# Patient Record
Sex: Female | Born: 1955 | Race: White | Hispanic: No | Smoking: Former smoker
Health system: Southern US, Community
[De-identification: ages and names within clinical notes are randomized; demographics above are authoritative.]

## PROBLEM LIST (undated history)

## (undated) DIAGNOSIS — I2510 Atherosclerotic heart disease of native coronary artery without angina pectoris: Secondary | ICD-10-CM

## (undated) DIAGNOSIS — G8929 Other chronic pain: Secondary | ICD-10-CM

## (undated) DIAGNOSIS — E039 Hypothyroidism, unspecified: Secondary | ICD-10-CM

## (undated) DIAGNOSIS — F329 Major depressive disorder, single episode, unspecified: Secondary | ICD-10-CM

## (undated) DIAGNOSIS — K219 Gastro-esophageal reflux disease without esophagitis: Secondary | ICD-10-CM

## (undated) DIAGNOSIS — E669 Obesity, unspecified: Secondary | ICD-10-CM

## (undated) DIAGNOSIS — J45909 Unspecified asthma, uncomplicated: Secondary | ICD-10-CM

## (undated) DIAGNOSIS — F32A Depression, unspecified: Secondary | ICD-10-CM

## (undated) DIAGNOSIS — Z720 Tobacco use: Secondary | ICD-10-CM

## (undated) DIAGNOSIS — I10 Essential (primary) hypertension: Secondary | ICD-10-CM

## (undated) DIAGNOSIS — Z9151 Personal history of suicidal behavior: Secondary | ICD-10-CM

## (undated) DIAGNOSIS — T8859XA Other complications of anesthesia, initial encounter: Secondary | ICD-10-CM

## (undated) DIAGNOSIS — F419 Anxiety disorder, unspecified: Secondary | ICD-10-CM

## (undated) DIAGNOSIS — I209 Angina pectoris, unspecified: Secondary | ICD-10-CM

## (undated) DIAGNOSIS — Z915 Personal history of self-harm: Secondary | ICD-10-CM

## (undated) DIAGNOSIS — J189 Pneumonia, unspecified organism: Secondary | ICD-10-CM

## (undated) DIAGNOSIS — M1990 Unspecified osteoarthritis, unspecified site: Secondary | ICD-10-CM

## (undated) DIAGNOSIS — E785 Hyperlipidemia, unspecified: Secondary | ICD-10-CM

## (undated) DIAGNOSIS — F1011 Alcohol abuse, in remission: Secondary | ICD-10-CM

## (undated) DIAGNOSIS — R079 Chest pain, unspecified: Secondary | ICD-10-CM

## (undated) DIAGNOSIS — T4145XA Adverse effect of unspecified anesthetic, initial encounter: Secondary | ICD-10-CM

## (undated) DIAGNOSIS — K529 Noninfective gastroenteritis and colitis, unspecified: Secondary | ICD-10-CM

## (undated) DIAGNOSIS — K589 Irritable bowel syndrome without diarrhea: Secondary | ICD-10-CM

## (undated) DIAGNOSIS — E119 Type 2 diabetes mellitus without complications: Secondary | ICD-10-CM

## (undated) HISTORY — DX: Obesity, unspecified: E66.9

## (undated) HISTORY — DX: Noninfective gastroenteritis and colitis, unspecified: K52.9

## (undated) HISTORY — PX: OTHER SURGICAL HISTORY: SHX169

## (undated) HISTORY — DX: Essential (primary) hypertension: I10

## (undated) HISTORY — DX: Personal history of suicidal behavior: Z91.51

## (undated) HISTORY — DX: Other chronic pain: G89.29

## (undated) HISTORY — PX: PTCA: SHX146

## (undated) HISTORY — DX: Depression, unspecified: F32.A

## (undated) HISTORY — DX: Gastro-esophageal reflux disease without esophagitis: K21.9

## (undated) HISTORY — DX: Personal history of self-harm: Z91.5

## (undated) HISTORY — DX: Anxiety disorder, unspecified: F41.9

## (undated) HISTORY — PX: ARTHROSCOPY KNEE W/ DRILLING: SUR92

## (undated) HISTORY — DX: Tobacco use: Z72.0

## (undated) HISTORY — DX: Hyperlipidemia, unspecified: E78.5

## (undated) HISTORY — PX: CARPAL TUNNEL RELEASE: SHX101

## (undated) HISTORY — DX: Major depressive disorder, single episode, unspecified: F32.9

## (undated) HISTORY — DX: Unspecified osteoarthritis, unspecified site: M19.90

## (undated) HISTORY — DX: Alcohol abuse, in remission: F10.11

## (undated) HISTORY — DX: Atherosclerotic heart disease of native coronary artery without angina pectoris: I25.10

## (undated) HISTORY — DX: Chest pain, unspecified: R07.9

## (undated) HISTORY — DX: Pneumonia, unspecified organism: J18.9

---
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
## 1980-06-14 HISTORY — PX: TUBAL LIGATION: SHX77

## 2005-10-19 ENCOUNTER — Emergency Department (HOSPITAL_COMMUNITY): Admission: EM | Admit: 2005-10-19 | Discharge: 2005-10-19 | Payer: Self-pay | Admitting: Emergency Medicine

## 2005-11-10 ENCOUNTER — Emergency Department (HOSPITAL_COMMUNITY): Admission: EM | Admit: 2005-11-10 | Discharge: 2005-11-10 | Payer: Self-pay | Admitting: Emergency Medicine

## 2005-11-15 ENCOUNTER — Emergency Department (HOSPITAL_COMMUNITY): Admission: EM | Admit: 2005-11-15 | Discharge: 2005-11-15 | Payer: Self-pay | Admitting: Emergency Medicine

## 2005-11-29 ENCOUNTER — Encounter: Admission: RE | Admit: 2005-11-29 | Discharge: 2005-11-29 | Payer: Self-pay | Admitting: Neurology

## 2006-05-03 ENCOUNTER — Ambulatory Visit (HOSPITAL_BASED_OUTPATIENT_CLINIC_OR_DEPARTMENT_OTHER): Admission: RE | Admit: 2006-05-03 | Discharge: 2006-05-03 | Payer: Self-pay | Admitting: Orthopedic Surgery

## 2006-06-14 DIAGNOSIS — I219 Acute myocardial infarction, unspecified: Secondary | ICD-10-CM

## 2006-06-14 HISTORY — DX: Acute myocardial infarction, unspecified: I21.9

## 2006-06-14 HISTORY — PX: CARDIAC CATHETERIZATION: SHX172

## 2006-09-04 ENCOUNTER — Ambulatory Visit: Payer: Self-pay | Admitting: Internal Medicine

## 2006-09-04 ENCOUNTER — Inpatient Hospital Stay (HOSPITAL_COMMUNITY): Admission: EM | Admit: 2006-09-04 | Discharge: 2006-09-07 | Payer: Self-pay | Admitting: Emergency Medicine

## 2006-09-14 ENCOUNTER — Ambulatory Visit: Payer: Self-pay | Admitting: Cardiovascular Disease

## 2006-09-21 ENCOUNTER — Ambulatory Visit: Payer: Self-pay | Admitting: Internal Medicine

## 2006-11-15 ENCOUNTER — Encounter (HOSPITAL_COMMUNITY): Admission: RE | Admit: 2006-11-15 | Discharge: 2006-11-15 | Payer: Self-pay | Admitting: Internal Medicine

## 2007-01-02 ENCOUNTER — Emergency Department (HOSPITAL_COMMUNITY): Admission: EM | Admit: 2007-01-02 | Discharge: 2007-01-02 | Payer: Self-pay | Admitting: Emergency Medicine

## 2007-03-07 ENCOUNTER — Ambulatory Visit: Payer: Self-pay | Admitting: Internal Medicine

## 2007-03-07 LAB — CONVERTED CEMR LAB
AST: 34 units/L (ref 0–37)
Albumin: 3.9 g/dL (ref 3.5–5.2)
Alkaline Phosphatase: 66 units/L (ref 39–117)
Bilirubin, Direct: 0.1 mg/dL (ref 0.0–0.3)
Chloride: 101 meq/L (ref 96–112)
GFR calc Af Amer: 114 mL/min
GFR calc non Af Amer: 94 mL/min
Glucose, Bld: 107 mg/dL — ABNORMAL HIGH (ref 70–99)
HDL: 35.6 mg/dL — ABNORMAL LOW (ref >39.0)
Potassium: 4.1 meq/L (ref 3.5–5.1)
Total Bilirubin: 0.8 mg/dL (ref 0.3–1.2)
VLDL: 28 mg/dL (ref 0–40)

## 2007-03-14 ENCOUNTER — Ambulatory Visit: Payer: Self-pay | Admitting: Internal Medicine

## 2007-08-19 ENCOUNTER — Emergency Department (HOSPITAL_COMMUNITY): Admission: EM | Admit: 2007-08-19 | Discharge: 2007-08-20 | Payer: Self-pay | Admitting: Emergency Medicine

## 2007-08-20 ENCOUNTER — Inpatient Hospital Stay (HOSPITAL_COMMUNITY): Admission: EM | Admit: 2007-08-20 | Discharge: 2007-08-24 | Payer: Self-pay | Admitting: *Deleted

## 2007-08-22 ENCOUNTER — Ambulatory Visit: Payer: Self-pay | Admitting: *Deleted

## 2007-09-07 ENCOUNTER — Ambulatory Visit: Payer: Self-pay | Admitting: Internal Medicine

## 2007-09-07 LAB — CONVERTED CEMR LAB
Albumin: 3.2 g/dL — ABNORMAL LOW (ref 3.5–5.2)
CO2: 31 meq/L (ref 19–32)
Chloride: 99 meq/L (ref 96–112)
Cholesterol: 147 mg/dL (ref 0–200)
Creatinine, Ser: 0.7 mg/dL (ref 0.4–1.2)
GFR calc Af Amer: 113 mL/min
GFR calc non Af Amer: 94 mL/min
HDL: 36.9 mg/dL — ABNORMAL LOW (ref >39.0)
LDL Cholesterol: 85 mg/dL (ref 0–99)
Sodium: 137 meq/L (ref 135–145)
Total CHOL/HDL Ratio: 4
Triglycerides: 124 mg/dL (ref 0–149)
VLDL: 25 mg/dL (ref 0–40)

## 2007-10-11 ENCOUNTER — Ambulatory Visit: Payer: Self-pay | Admitting: Cardiology

## 2007-10-17 ENCOUNTER — Ambulatory Visit: Payer: Self-pay | Admitting: Internal Medicine

## 2007-10-17 LAB — CONVERTED CEMR LAB
BUN: 6 mg/dL (ref 6–23)
CO2: 31 meq/L (ref 19–32)

## 2007-12-02 ENCOUNTER — Emergency Department (HOSPITAL_COMMUNITY): Admission: EM | Admit: 2007-12-02 | Discharge: 2007-12-02 | Payer: Self-pay | Admitting: Emergency Medicine

## 2008-05-21 ENCOUNTER — Ambulatory Visit: Payer: Self-pay | Admitting: Internal Medicine

## 2008-05-21 ENCOUNTER — Inpatient Hospital Stay (HOSPITAL_BASED_OUTPATIENT_CLINIC_OR_DEPARTMENT_OTHER): Admission: RE | Admit: 2008-05-21 | Discharge: 2008-05-21 | Payer: Self-pay | Admitting: Internal Medicine

## 2008-05-21 ENCOUNTER — Ambulatory Visit: Payer: Self-pay

## 2008-05-21 ENCOUNTER — Ambulatory Visit: Payer: Self-pay | Admitting: Cardiovascular Disease

## 2008-05-21 ENCOUNTER — Inpatient Hospital Stay (HOSPITAL_COMMUNITY): Admission: AD | Admit: 2008-05-21 | Discharge: 2008-05-23 | Payer: Self-pay | Admitting: Internal Medicine

## 2008-05-21 LAB — CONVERTED CEMR LAB
Basophils Absolute: 0.1 10*3/uL (ref 0.0–0.1)
Basophils Relative: 0.6 % (ref 0.0–3.0)
CO2: 30 meq/L (ref 19–32)
Eosinophils Absolute: 0.2 10*3/uL (ref 0.0–0.7)
Eosinophils Relative: 2 % (ref 0.0–5.0)
GFR calc non Af Amer: 112 mL/min
HCT: 39.9 % (ref 36.0–46.0)
INR: 1 (ref 0.8–1.0)
MCHC: 34.5 g/dL (ref 30.0–36.0)
MCV: 100.5 fL — ABNORMAL HIGH (ref 78.0–100.0)
Neutro Abs: 5.3 10*3/uL (ref 1.4–7.7)
Neutrophils Relative %: 54.4 % (ref 43.0–77.0)
Platelets: 229 10*3/uL (ref 150–400)
RBC: 3.97 M/uL (ref 3.87–5.11)
RDW: 12 % (ref 11.5–14.6)

## 2008-08-12 HISTORY — PX: COLON BIOPSY: SHX1369

## 2008-08-21 ENCOUNTER — Encounter (INDEPENDENT_AMBULATORY_CARE_PROVIDER_SITE_OTHER): Payer: Self-pay | Admitting: *Deleted

## 2008-08-21 ENCOUNTER — Emergency Department (HOSPITAL_COMMUNITY): Admission: EM | Admit: 2008-08-21 | Discharge: 2008-08-21 | Payer: Self-pay | Admitting: Emergency Medicine

## 2008-08-21 ENCOUNTER — Ambulatory Visit: Payer: Self-pay | Admitting: Internal Medicine

## 2008-08-21 DIAGNOSIS — E785 Hyperlipidemia, unspecified: Secondary | ICD-10-CM | POA: Insufficient documentation

## 2008-08-21 DIAGNOSIS — F17200 Nicotine dependence, unspecified, uncomplicated: Secondary | ICD-10-CM | POA: Insufficient documentation

## 2008-08-21 DIAGNOSIS — I10 Essential (primary) hypertension: Secondary | ICD-10-CM | POA: Insufficient documentation

## 2008-08-21 LAB — CONVERTED CEMR LAB
AST: 31 units/L (ref 0–37)
Albumin: 4.1 g/dL (ref 3.5–5.2)
BUN: 22 mg/dL (ref 6–23)
Calcium: 9.8 mg/dL (ref 8.4–10.5)
Chloride: 104 meq/L (ref 96–112)
Cholesterol: 185 mg/dL (ref 0–200)
GFR calc Af Amer: 97 mL/min
GFR calc non Af Amer: 80 mL/min
Glucose, Bld: 113 mg/dL — ABNORMAL HIGH (ref 70–99)
HDL: 32.4 mg/dL — ABNORMAL LOW (ref >39.0)
Triglycerides: 142 mg/dL (ref 0–149)
VLDL: 28 mg/dL (ref 0–40)

## 2008-08-22 ENCOUNTER — Encounter: Payer: Self-pay | Admitting: Internal Medicine

## 2008-08-22 ENCOUNTER — Encounter (INDEPENDENT_AMBULATORY_CARE_PROVIDER_SITE_OTHER): Payer: Self-pay | Admitting: *Deleted

## 2008-08-22 ENCOUNTER — Ambulatory Visit: Payer: Self-pay | Admitting: Internal Medicine

## 2008-08-22 ENCOUNTER — Telehealth: Payer: Self-pay | Admitting: Internal Medicine

## 2008-08-22 DIAGNOSIS — Z860100 Personal history of colon polyps, unspecified: Secondary | ICD-10-CM | POA: Insufficient documentation

## 2008-08-22 LAB — CONVERTED CEMR LAB
MCHC: 35.9 g/dL (ref 30.0–36.0)
Monocytes Absolute: 0.7 10*3/uL (ref 0.1–1.0)
Neutro Abs: 9 10*3/uL — ABNORMAL HIGH (ref 1.4–7.7)
Platelets: 209 10*3/uL (ref 150–400)
Prothrombin Time: 10.3 s — ABNORMAL LOW (ref 10.9–13.3)
WBC: 12.6 10*3/uL — ABNORMAL HIGH (ref 4.5–10.5)

## 2008-08-23 ENCOUNTER — Ambulatory Visit: Payer: Self-pay | Admitting: Internal Medicine

## 2008-08-23 ENCOUNTER — Ambulatory Visit (HOSPITAL_COMMUNITY): Admission: RE | Admit: 2008-08-23 | Discharge: 2008-08-23 | Payer: Self-pay | Admitting: Internal Medicine

## 2008-09-04 ENCOUNTER — Telehealth: Payer: Self-pay | Admitting: Physician Assistant

## 2008-09-06 ENCOUNTER — Telehealth: Payer: Self-pay | Admitting: Internal Medicine

## 2008-09-09 ENCOUNTER — Encounter: Payer: Self-pay | Admitting: Internal Medicine

## 2008-09-09 ENCOUNTER — Ambulatory Visit: Payer: Self-pay | Admitting: Internal Medicine

## 2008-09-11 ENCOUNTER — Encounter: Payer: Self-pay | Admitting: Internal Medicine

## 2008-11-08 ENCOUNTER — Encounter (INDEPENDENT_AMBULATORY_CARE_PROVIDER_SITE_OTHER): Payer: Self-pay | Admitting: *Deleted

## 2008-12-23 ENCOUNTER — Encounter: Payer: Self-pay | Admitting: Internal Medicine

## 2009-01-15 ENCOUNTER — Encounter: Payer: Self-pay | Admitting: Physician Assistant

## 2009-01-15 ENCOUNTER — Encounter: Payer: Self-pay | Admitting: Internal Medicine

## 2009-02-03 ENCOUNTER — Telehealth: Payer: Self-pay | Admitting: Internal Medicine

## 2009-02-12 DIAGNOSIS — K5289 Other specified noninfective gastroenteritis and colitis: Secondary | ICD-10-CM | POA: Insufficient documentation

## 2009-02-13 ENCOUNTER — Telehealth: Payer: Self-pay | Admitting: Internal Medicine

## 2009-02-22 ENCOUNTER — Encounter: Payer: Self-pay | Admitting: Internal Medicine

## 2009-02-23 ENCOUNTER — Encounter: Payer: Self-pay | Admitting: Internal Medicine

## 2009-02-23 ENCOUNTER — Encounter: Payer: Self-pay | Admitting: Physician Assistant

## 2009-03-03 ENCOUNTER — Telehealth: Payer: Self-pay | Admitting: Internal Medicine

## 2009-03-03 ENCOUNTER — Ambulatory Visit: Payer: Self-pay | Admitting: Internal Medicine

## 2009-03-05 ENCOUNTER — Ambulatory Visit: Payer: Self-pay | Admitting: Internal Medicine

## 2009-03-05 ENCOUNTER — Encounter: Payer: Self-pay | Admitting: Physician Assistant

## 2009-03-05 ENCOUNTER — Telehealth (INDEPENDENT_AMBULATORY_CARE_PROVIDER_SITE_OTHER): Payer: Self-pay | Admitting: *Deleted

## 2009-03-05 DIAGNOSIS — R079 Chest pain, unspecified: Secondary | ICD-10-CM | POA: Insufficient documentation

## 2009-03-07 ENCOUNTER — Encounter: Payer: Self-pay | Admitting: Internal Medicine

## 2009-03-11 ENCOUNTER — Telehealth (INDEPENDENT_AMBULATORY_CARE_PROVIDER_SITE_OTHER): Payer: Self-pay | Admitting: *Deleted

## 2009-04-03 ENCOUNTER — Telehealth: Payer: Self-pay | Admitting: Internal Medicine

## 2009-04-16 ENCOUNTER — Telehealth: Payer: Self-pay | Admitting: Internal Medicine

## 2009-05-28 ENCOUNTER — Telehealth: Payer: Self-pay | Admitting: Internal Medicine

## 2009-07-07 ENCOUNTER — Telehealth: Payer: Self-pay | Admitting: Internal Medicine

## 2009-07-17 ENCOUNTER — Telehealth: Payer: Self-pay | Admitting: Internal Medicine

## 2009-07-18 ENCOUNTER — Ambulatory Visit: Payer: Self-pay | Admitting: Internal Medicine

## 2009-07-18 LAB — CONVERTED CEMR LAB
ALT: 31 units/L (ref 0–35)
AST: 34 units/L (ref 0–37)
Albumin: 4.1 g/dL (ref 3.5–5.2)
Alkaline Phosphatase: 63 units/L (ref 39–117)
BUN: 13 mg/dL (ref 6–23)
Bilirubin, Direct: 0.2 mg/dL (ref 0.0–0.3)
Chloride: 103 meq/L (ref 96–112)
Cholesterol: 110 mg/dL (ref 0–200)
Creatinine, Ser: 0.6 mg/dL (ref 0.4–1.2)
Magnesium: 1.7 mg/dL (ref 1.5–2.5)
Potassium: 3.6 meq/L (ref 3.5–5.1)
Sodium: 138 meq/L (ref 135–145)
Total Bilirubin: 0.8 mg/dL (ref 0.3–1.2)
Total CHOL/HDL Ratio: 3

## 2009-07-23 ENCOUNTER — Ambulatory Visit: Payer: Self-pay | Admitting: Internal Medicine

## 2009-07-23 LAB — CONVERTED CEMR LAB: LDL Goal: 100 mg/dL

## 2009-08-04 ENCOUNTER — Telehealth: Payer: Self-pay | Admitting: Internal Medicine

## 2009-08-07 ENCOUNTER — Telehealth: Payer: Self-pay | Admitting: Internal Medicine

## 2009-08-18 ENCOUNTER — Telehealth: Payer: Self-pay | Admitting: Internal Medicine

## 2009-08-28 ENCOUNTER — Telehealth (INDEPENDENT_AMBULATORY_CARE_PROVIDER_SITE_OTHER): Payer: Self-pay | Admitting: *Deleted

## 2009-10-02 ENCOUNTER — Emergency Department (HOSPITAL_COMMUNITY): Admission: EM | Admit: 2009-10-02 | Discharge: 2009-10-03 | Payer: Self-pay | Admitting: Emergency Medicine

## 2009-10-03 ENCOUNTER — Ambulatory Visit: Payer: Self-pay | Admitting: Psychiatry

## 2009-10-03 ENCOUNTER — Inpatient Hospital Stay (HOSPITAL_COMMUNITY): Admission: AD | Admit: 2009-10-03 | Discharge: 2009-10-10 | Payer: Self-pay | Admitting: Psychiatry

## 2009-10-15 ENCOUNTER — Telehealth: Payer: Self-pay | Admitting: Internal Medicine

## 2009-11-05 ENCOUNTER — Telehealth: Payer: Self-pay | Admitting: Internal Medicine

## 2009-12-19 ENCOUNTER — Encounter (INDEPENDENT_AMBULATORY_CARE_PROVIDER_SITE_OTHER): Payer: Self-pay | Admitting: *Deleted

## 2009-12-30 ENCOUNTER — Telehealth (INDEPENDENT_AMBULATORY_CARE_PROVIDER_SITE_OTHER): Payer: Self-pay | Admitting: *Deleted

## 2009-12-31 ENCOUNTER — Ambulatory Visit: Payer: Self-pay

## 2009-12-31 ENCOUNTER — Encounter (HOSPITAL_COMMUNITY): Admission: RE | Admit: 2009-12-31 | Discharge: 2010-03-02 | Payer: Self-pay | Admitting: Internal Medicine

## 2009-12-31 ENCOUNTER — Encounter: Payer: Self-pay | Admitting: Cardiology

## 2009-12-31 ENCOUNTER — Ambulatory Visit: Payer: Self-pay | Admitting: Cardiology

## 2010-01-05 ENCOUNTER — Ambulatory Visit: Payer: Self-pay | Admitting: Internal Medicine

## 2010-01-08 ENCOUNTER — Ambulatory Visit: Payer: Self-pay | Admitting: Internal Medicine

## 2010-01-08 LAB — CONVERTED CEMR LAB: Magnesium: 1.9 mg/dL (ref 1.5–2.5)

## 2010-01-12 LAB — CONVERTED CEMR LAB
ALT: 21 units/L (ref 0–35)
CO2: 29 meq/L (ref 19–32)
Calcium: 9.1 mg/dL (ref 8.4–10.5)
Cholesterol: 140 mg/dL (ref 0–200)
Direct LDL: 86.2 mg/dL
GFR calc non Af Amer: 115.24 mL/min (ref >60)
Potassium: 4 meq/L (ref 3.5–5.1)
Sodium: 138 meq/L (ref 135–145)
Total Bilirubin: 0.7 mg/dL (ref 0.3–1.2)
Total CHOL/HDL Ratio: 5

## 2010-01-13 ENCOUNTER — Telehealth: Payer: Self-pay | Admitting: Internal Medicine

## 2010-01-19 ENCOUNTER — Telehealth: Payer: Self-pay | Admitting: Internal Medicine

## 2010-01-21 ENCOUNTER — Ambulatory Visit: Payer: Self-pay | Admitting: Critical Care Medicine

## 2010-01-21 ENCOUNTER — Telehealth (INDEPENDENT_AMBULATORY_CARE_PROVIDER_SITE_OTHER): Payer: Self-pay | Admitting: *Deleted

## 2010-01-21 DIAGNOSIS — J439 Emphysema, unspecified: Secondary | ICD-10-CM | POA: Insufficient documentation

## 2010-01-21 DIAGNOSIS — I219 Acute myocardial infarction, unspecified: Secondary | ICD-10-CM | POA: Insufficient documentation

## 2010-01-23 ENCOUNTER — Telehealth: Payer: Self-pay | Admitting: Internal Medicine

## 2010-01-23 ENCOUNTER — Telehealth (INDEPENDENT_AMBULATORY_CARE_PROVIDER_SITE_OTHER): Payer: Self-pay | Admitting: *Deleted

## 2010-02-09 ENCOUNTER — Telehealth: Payer: Self-pay | Admitting: Critical Care Medicine

## 2010-03-25 ENCOUNTER — Ambulatory Visit: Payer: Self-pay | Admitting: Critical Care Medicine

## 2010-04-28 ENCOUNTER — Telehealth: Payer: Self-pay | Admitting: Internal Medicine

## 2010-05-11 ENCOUNTER — Telehealth: Payer: Self-pay | Admitting: Internal Medicine

## 2010-05-13 ENCOUNTER — Encounter: Payer: Self-pay | Admitting: Internal Medicine

## 2010-07-08 ENCOUNTER — Ambulatory Visit
Admission: RE | Admit: 2010-07-08 | Discharge: 2010-07-08 | Payer: Self-pay | Source: Home / Self Care | Attending: Critical Care Medicine | Admitting: Critical Care Medicine

## 2010-07-14 NOTE — Progress Notes (Signed)
Summary: plavix samples   Phone Note Call from Patient Call back at Home Phone [phone]   Caller: Patient Reason for Call: Talk to Nurse Summary of Call: pt has appt 2/9, request 2wk supply sample of plavix, please leave message wether or not we have samples for her Initial call taken by: Migdalia Dk,  July 07, 2009 12:29 PM  Follow-up for Phone Call        Spoke with pt. Patient states had a cardiac Cath in [facility] 05/28/09. On discharge  a prescription for a free prescription refill has given to her. She is out of medication after today. She  needs about 2 weeks of plavix medication. Pt. states Heather Dr. Prescott Gum nurse is working on getting her medication from [employer] [facility]. Pt. has an appointment with Dr. Gala Romney on 07/23/09.  A 16/75 mg  Plavix sample pills placed at the front desk for pt. Patient aware. Follow-up by: Ollen Gross, RN, BSN,  July 07, 2009 1:17 PM

## 2010-07-14 NOTE — Progress Notes (Signed)
Summary: question re med and reorder med  Medications Added BENAZEPRIL HCL 40 MG  TABS (BENAZEPRIL HCL) 1/2 tablet by mouth once daily METOPROLOL TARTRATE 25 MG TABS (METOPROLOL TARTRATE) Take 1/2  tablet by mouth two times a day       Phone Note Call from Patient   Caller: Patient Reason for Call: Refill Medication, Talk to Nurse Summary of Call: pt requesting we reorder her plavix and crestor and call when it arrives, also has other medication questions-pls call [phone] Initial call taken by: Glynda Jaeger,  January 13, 2010 3:55 PM  Follow-up for Phone Call        she states at [facility] she discussed w/Dr Brigett Estell about feeling dizzy and BP being slightly low, she states she told him she had cut her benazepril and metoprolol back to once daily, she states he advised her to cont 1 once daily and take the other two times a day but she cant remember which, will discuss w/him and call her back tom Meredith Staggers, RN  January 13, 2010 4:42 PM   Additional Follow-up for Phone Call Additional follow up Details #1::        would cut lisinopril in half. cont metoprolol 25 two times a day Dolores Patty, MD, [facility]  January 13, 2010 4:47 PM  plavix and lipitor both ordered from companies [employer], RN  January 14, 2010 9:44 AM   clarification of Dr Lewanda Rife recommendations, cut benazapril in 1/2 and change metoprolol to 12.5mg  two times a day   pt aware will make changes, will call her when plavix and lipitor arrive Meredith Staggers, RN  January 15, 2010 3:38 PM     New/Updated Medications: BENAZEPRIL HCL 40 MG  TABS (BENAZEPRIL HCL) 1/2 tablet by mouth once daily METOPROLOL TARTRATE 25 MG TABS (METOPROLOL TARTRATE) Take 1/2  tablet by mouth two times a day

## 2010-07-14 NOTE — Progress Notes (Signed)
Summary: refill request   Phone Note Refill Request Message from:  Patient on January 23, 2010 3:55 PM  Refills Requested: Medication #1:  HYDROCHLOROTHIAZIDE 12.5 MG TABS Take one tablet by mouth daily. [facility] [address]   Method Requested: Telephone to Pharmacy Initial call taken by: Glynda Jaeger,  January 23, 2010 3:55 PM  Follow-up for Phone Call        Rx called to pharmacy. Vikki Ports  January 23, 2010 4:03 PM     Prescriptions: HYDROCHLOROTHIAZIDE 12.5 MG TABS (HYDROCHLOROTHIAZIDE) Take one tablet by mouth daily.  #90 x 3   Entered by:   Vikki Ports   Authorized by:   Dolores Patty, MD, [facility]   Signed by:   Vikki Ports on 01/23/2010   Method used:   Faxed to ...       [facility] [address] (retail)       [phone]       [place], [place]  [address]       Ph: [phone]       Fax: [phone]   RxID:   [id number]

## 2010-07-14 NOTE — Letter (Signed)
Summary: for plavix  [facility], Main Office  [address]   [place], [place] [address]   Phone: [phone]  Fax: [phone]        July 23, 2009 MRN: [id number]   Regarding: Kara Lamb [address] [place], [place]  [address] DOB [birth year]    To Whom It May Concern:  I am writing this letter with regard to [name].  Kara Lamb is a 55 year old woman whom I follow in Cardiology Clinic.  She has a history of hypertension, hyperlipidemia, and coronary artery disease.  She underwent stenting of her proximal RCA and circumflex with bare-metal stents in March 2008.  Unfortunately in December of 2009 she suffered in-stent restenosis of one fo her RCA stents.  She underwent angioplasty and placement of a Xience drug-eluting stent.  She will now need long-term Plavix.  Unfortunately given her financial situation, she will have a hard time affording this.  Given the critical need for this medication, I am writing to you for assistance with providing her Plavix at either reduced rate or at no charge.  We appreciate whatever you can do to help [name] out.  Should you have any questions or concerns, please do not hesitate to contact me personally.     Sincerely,  Daneil R. Greenlee Ancheta, MD  This letter has been electronically signed by your physician.

## 2010-07-14 NOTE — Assessment & Plan Note (Signed)
Summary: Cardiology Nuclear Testing  Nuclear Med Background Indications for Stress Test: Evaluation for Ischemia, Stent Patency  Indications Comments: Assess for disease progression   History: COPD, Heart Catheterization, Myocardial Infarction, Myocardial Perfusion Study, Stents  History Comments: '08 Stents-RCA, CFX>MI during stents; 11/10 Cath:n/o CAD, 50% in-stent restenosis-RCA  Symptoms: Chest Pressure, Chest Pressure with Exertion, Chest Tightness, Chest Tightness with Exertion, Diaphoresis, Dizziness, DOE, Fatigue, Nausea, Palpitations  Symptoms Comments: Last episode of GN:FAOZ past weekend.   Nuclear Pre-Procedure Cardiac Risk Factors: Family History - CAD, Hypertension, Lipids, Obesity, Smoker Caffeine/Decaff Intake: None NPO After: 6:30 AM Lungs: Clear.  O2 Sat 96% on RA. IV 0.9% NS with Angio Cath: 24g     IV Site: (L) AC IV Started by: Irean Hong RN Chest Size (in) 40     Cup Size DD     Height (in): 62 Weight (lb): 202 BMI: 37.08 Tech Comments: Metoprolol held x 24 hours.  Nuclear Med Study 1 or 2 day study:  1 day     Stress Test Type:  Eugenie Birks Reading MD:  Willa Rough, MD     Referring MD:  Arvilla Meres, MD Resting Radionuclide:  Technetium 3m Tetrofosmin     Resting Radionuclide Dose:  10.7 mCi  Stress Radionuclide:  Technetium 58m Tetrofosmin     Stress Radionuclide Dose:  33.0 mCi   Stress Protocol   Lexiscan: 0.4 mg   Stress Test Technologist:  [employer] CMA-N     Nuclear Technologist:  Harlow Asa CNMT  Rest Procedure  Myocardial perfusion imaging was performed at rest 45 minutes following the intravenous administration of Myoview Technetium 40m Tetrofosmin.  Stress Procedure  The patient received IV Lexiscan 0.4 mg over 15-seconds.  Myoview injected at 30-seconds.  There were no significant changes with infusion, other than occasional PVC's.  Quantitative spect images were obtained after a 45 minute delay.  QPS Raw Data Images:   Normal; no motion artifact; normal heart/lung ratio. Stress Images:  There is normal uptake in all areas. Rest Images:  Normal homogeneous uptake in all areas of the myocardium. Subtraction (SDS):  No evidence of ischemia. Transient Ischemic Dilatation:  1.03  (Normal <1.22)  Lung/Heart Ratio:  .29  (Normal <0.45)  Quantitative Gated Spect Images QGS EDV:  76 ml QGS ESV:  20 ml QGS EF:  74 % QGS cine images:  Normal motion  Findings Normal nuclear study      Overall Impression  Exercise Capacity: Lexiscan BP Response: Normal blood pressure response. Clinical Symptoms: Chest tight ECG Impression: No significant ST segment change suggestive of ischemia. Overall Impression: Normal stress nuclear study.  Appended Document: Cardiology Nuclear Testing have attempted to call pt w/results, phone mess states "person is not able to accept calls at this time"  Appended Document: Cardiology Nuclear Testing pt aware at [facility] 7/28

## 2010-07-14 NOTE — Progress Notes (Signed)
Summary: plavix/lipitor   Phone Note Call from Patient Call back at Home Phone [phone]   Caller: Patient Summary of Call: please order more of the plavix and lipitor Initial call taken by: Migdalia Dk,  Oct 15, 2009 1:18 PM  Follow-up for Phone Call        Plavix and Lipitor both ordered, pt aware will let her know when meds arrive.  Pt aslo needs to sch myoview and f/u appt for July, per Dr Melburn Popper last ov note will get myoview in 6 months Meredith Staggers, RN  Oct 15, 2009 2:25 PM

## 2010-07-14 NOTE — Assessment & Plan Note (Signed)
Summary: f/u myoview done 12-31-09/sl / appt is 1:30/ gd  Medications Added BENAZEPRIL HCL 40 MG  TABS (BENAZEPRIL HCL) 1 tablet by mouth once daily METOPROLOL TARTRATE 25 MG TABS (METOPROLOL TARTRATE) Take one tablet by mouth once daily CITALOPRAM HYDROBROMIDE 40 MG TABS (CITALOPRAM HYDROBROMIDE) once daily RISPERDAL 1 MG TABS (RISPERIDONE) at bedtime GABAPENTIN 400 MG CAPS (GABAPENTIN) at bedtime      Allergies Added:   Visit Type:  Follow-up Referring Provider:  Valma Cava, MD Primary Provider:  n/a  CC:  has CP every 2-3 weeks.  History of Present Illness: Kara Lamb is a 55 year old  woman with CAD and chronic chest pain. She's had previous stenting of her RCA and left circumflex complicated by in-stent restenosis of her RCA back in 2009. At that time. She underwent drug-eluting stent to her RCA.   Also with h/o HTN, HL, obesity, reflex sympathetic dystrophy and suicidal depression  Last  cath on 05/06/09. LAD mild plaque. LCX 10% RCA 50% ISR (FFR 0.91/0.92) Distal RCA 40/50  Had Myoview last week: EF 74% no ischemia or infarct.   Doing fairly well. Has CP either once per week or every other week. Typically ocurs when she's fatigued. Can last 2-3 days in a row. Has dyspnea with exertion. Still smoking 4-6 cigs per day. Has quit 3x recently for up to 6 weeks but then restarts. Recently started on Rispedal and has been gaining weight. Exercise limited by back pain and leg pain.   Has problems with GERD and PPI increased to BID last year. Has been followd in past by Dr. Lina Sar in GI. Had colonoscopy last year but no EGD. No melena or BRBPR.   Current Medications (verified): 1)  Benazepril Hcl 40 Mg  Tabs (Benazepril Hcl) .Marland Kitchen.. 1 Tablet By Mouth Once Daily 2)  Hydrochlorothiazide 12.5 Mg Tabs (Hydrochlorothiazide) .... Take One Tablet By Mouth Daily. 3)  Metoprolol Tartrate 25 Mg Tabs (Metoprolol Tartrate) .... Take One Tablet By Mouth Once Daily 4)  Lipitor 80 Mg Tabs  (Atorvastatin Calcium) .... Take One Tablet By Mouth Daily. 5)  Aspirin Ec 325 Mg Tbec (Aspirin) .... Take One Tablet By Mouth Daily 6)  Prilosec Otc 20 Mg Tbec (Omeprazole Magnesium) .... One By Mouth Two Times A Day 7)  Multivitamins   Tabs (Multiple Vitamin) .Marland Kitchen.. 1 Once Daily 8)  Vitamin B Complex-C   Caps (B Complex-C) 9)  Fish Oil   Oil (Fish Oil) .... 1000mg  4 Caps Daily 10)  Citalopram Hydrobromide 40 Mg Tabs (Citalopram Hydrobromide) .... Once Daily 11)  Alprazolam 1 Mg Tabs (Alprazolam) .... Take 1/2 To 1 Tablet By Mouth Once Daily 12)  Nitrostat 0.4 Mg Subl (Nitroglycerin) .... As Directed 13)  Magnesium Oxide 250 Mg Tabs (Magnesium Oxide) .... Take 1 Tablet By Mouth Once A Day 14)  Plavix 75 Mg Tabs (Clopidogrel Bisulfate) .... Take One Tablet By Mouth Daily 15)  Risperdal 1 Mg Tabs (Risperidone) .... At Bedtime 16)  Gabapentin 400 Mg Caps (Gabapentin) .... At Bedtime  Allergies (verified): 1)  ! Imdur  Past History:  Past Medical History: Reviewed history from 07/23/2009 and no changes required. 1. CAD      --s/p BMS to RCA and LCX 2008.      --In-stent restenosis of RCA stent rx'd with Xience DES 12/09      --requires extensive sedation for cath      --last cath 11/10: LAD mild plaque. LCX 10% RCA 50% ISR (FFR 0.91/0.92) Distal RCA 40/50  2. Chronic CP  3. Anxiety/depression      --h/o suicide attempt/drug overdose 3/08  4. Hypertension.  5. Obesity.  6. Tobacco use.   7. History of alcohol abuse, now quit.  8. 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  9. Gastroesophageal reflux disease. Arthritis COPD Depression Hyperlipidemia Pneumonia Urinary Tract Infection colitis  Review of Systems       As per HPI and past medical history; otherwise all systems negative.   Vital Signs:  Patient profile:   55 year old female Height:      62 inches Weight:      205  pounds BMI:     37.63 Pulse rate:   75 / minute BP sitting:   98 / 72  (right arm) Cuff size:   regular  Vitals Entered By: [name], RMA (January 08, 2010 2:14 PM)  Physical Exam  General:  well appearing. no resp difficulty HEENT: normal Neck: supple. no JVD. Carotids 2+ bilat; no bruits. No lymphadenopathy or thryomegaly appreciated. Cor: PMI nondisplaced. Regular rate & rhythm. No rubs, gallops, murmur. Lungs: clear Abdomen: soft, nontender, nondistended. No hepatosplenomegaly. No bruits or masses. Good bowel sounds. Extremities: no cyanosis, clubbing, rash, edema Neuro: alert & orientedx3, cranial nerves grossly intact. moves all 4 extremities w/o difficulty. affect pleasant    Impression & Recommendations:  Problem # 1:  CHEST PAIN UNSPECIFIED (ICD-786.50) Based on the quality of her CP and normal nuclear study doubt her CP is ischemic. We did discuss the possibility of starting Ranexa to see if this would help but she is on Risperadal and QT is already a bit long so I am reluctant to start this. She has requested to see Dr. Delford Field in pulmonary to see if he has any ideas and we will faciliate. Will also have her f/u with Dr. Lina Sar for f/u on GI symptoms.   Other Orders: Pulmonary Referral (Pulmonary)  Patient Instructions: 1)  Your physician recommends that you schedule a follow-up appointment in: 9 months withDr Bensimhon 2)  Your physician recommends that you continue on your current medications as directed. Please refer to the Current Medication list given to you today. 3)  You have been referred to Dr Lina Sar and Dr Danise Mina

## 2010-07-14 NOTE — Progress Notes (Signed)
Summary: alpha 1 results  Phone Note Outgoing Call   Call placed by: Gweneth Dimitri RN,  February 09, 2010 10:35 AM Call placed to: Patient Summary of Call: Alpha 1 test results received and are negative.  LMOMTCB Initial call taken by: Gweneth Dimitri RN,  February 09, 2010 10:37 AM  Follow-up for Phone Call        pt returned call. Tivis Ringer, CNA  February 09, 2010 10:43 AM  Spoke with pt.  Pt informed Alpha 1 test negative -- she verbalized understanding of this.  Follow-up by: Gweneth Dimitri RN,  February 09, 2010 11:27 AM

## 2010-07-14 NOTE — Progress Notes (Signed)
Summary: pls call re med list   Phone Note Call from Patient   Caller: Patient [phone] Reason for Call: Talk to Nurse Summary of Call: pls call pt re medication list ok to call monday-pls call [phone] Initial call taken by: Glynda Jaeger,  January 23, 2010 3:57 PM  Follow-up for Phone Call        spoke w/pt she needed refill on some meds and also wanted [place] to know Dr Delford Field had changed benazepril to benicar, Meredith Staggers, RN  January 27, 2010 2:26 PM     Prescriptions: METOPROLOL TARTRATE 25 MG TABS (METOPROLOL TARTRATE) Take 1 tablet by mouth once a day  #90 x 3   Entered by:   Meredith Staggers, RN   Authorized by:   Dolores Patty, MD, [facility]   Signed by:   Meredith Staggers, RN on 01/27/2010   Method used:   Electronically to        [facility] [address] (retail)       [phone]       [place], [place]  [address]       Ph: [phone]       Fax: [phone]   RxID:   [id number] NITROSTAT 0.4 MG SUBL (NITROGLYCERIN) As directed  #25 x 3   Entered by:   Meredith Staggers, RN   Authorized by:   Dolores Patty, MD, [facility]   Signed by:   Meredith Staggers, RN on 01/27/2010   Method used:   Electronically to        [facility] [address] (retail)       [phone]       [place], [place]  [address]       Ph: [phone]       Fax: [phone]   RxID:   [id number] HYDROCHLOROTHIAZIDE 12.5 MG TABS (HYDROCHLOROTHIAZIDE) Take one tablet by mouth daily.  #90 x 3   Entered by:   Meredith Staggers, RN   Authorized by:   Dolores Patty, MD, [facility]   Signed by:   Meredith Staggers, RN on 01/27/2010   Method used:   Electronically to        [facility] [address] (retail)       [phone]       [place], [place]  [address]       Ph: [phone]       Fax: [phone]   RxID:   [id number]

## 2010-07-14 NOTE — Progress Notes (Signed)
Summary: lab work    Phone Note Call from Patient Call back at [employer] [phone]   Caller: Patient Reason for Call: Talk to Nurse Summary of Call: needs to speak to nurse... has appt on 2/9, request lab work before appt  Initial call taken by: Migdalia Dk,  July 17, 2009 9:57 AM  Follow-up for Phone Call        pt coming for labs 2/4 Meredith Staggers, RN  July 17, 2009 4:51 PM

## 2010-07-14 NOTE — Progress Notes (Signed)
Summary: NEED MEDICATION ORDRED--Plavix   Phone Note Call from Patient Call back at Home Phone [phone] Message from:  Patient on April 28, 2010 11:29 AM  Refills Requested: Medication #1:  PLAVIX 75 MG TABS Take one tablet by mouth daily Caller: Patient Summary of Call: NEED PLAVIX AND ZOLOFT Initial call taken by: Judie Grieve,  April 28, 2010 11:33 AM  Follow-up for Phone Call        Plavix ordered, left mess on ID vm will c/b when it arrives, she should call pcp for zoloft Meredith Staggers, RN  April 28, 2010 1:43 PM      Appended Document: NEED MEDICATION ORDRED Plavix arrived left message for pt it was at front desk to p/u

## 2010-07-14 NOTE — Progress Notes (Signed)
Summary: pt rtn call to [facility]   Phone Note Call from Patient Call back at Home Phone [phone]   Caller: Patient Reason for Call: Refill Medication, Talk to Nurse, Talk to Doctor Summary of Call: pt will have her phone with her she promise and she will need a Rx for lipitor because it may not qualify at this time so she was returning your call from last week and she is coming to pick up plavix today Initial call taken by: Omer Jack,  May 11, 2010 8:07 AM  Follow-up for Phone Call        PT WILL BE COMING IN TODAY TO PICK UP SAMPLES AND SIGN APPLICATION FOR LIPITOR ASSITANCE.  PT IS AWARE LIPITOR TO BE GENERIC 11/30 Follow-up by: Charolotte Capuchin, RN,  May 11, 2010 9:19 AM

## 2010-07-14 NOTE — Assessment & Plan Note (Signed)
Summary: Pulmonary Consultation   Copy to:  Dr. Gala Romney Primary Provider/Referring Provider:  n/a  CC:  Pulmonary Consult for dyspnea. and COPD initial evaluation.  History of Present Illness: Pulmonary Consultation       This is a 55 year old woman who presents for COPD initial evaluation.  The patient complains of shortness of breath, chest tightness, chest pain worse with breathing and coughing, wheezing, cough, mucous production, nocturnal awakening, exercise induced symptoms, and congestion, but denies history of diagnosed COPD.  Prior evaluation and testing has included Cxr.  The dyspnea is described as with slow ADL's, with walking within room, with walking from the car to building, with walking to the mailbox and back, and during the day.  Associated disease(s) include(s) coronary artery disease, nasal congestion, change in color of mucous, productive cough, chest pain, and chest tightness.  Oxygen evaluation is described as not on supplemental O2.    Pt with Dx CAD.  Pt notes last heart cath 11/10  , the pt developed small blockages over 11months.  The pt still has recurring chest pain.  Cardiology feels is non cardiac.  UGI was recommended but pt wanted pulm eval first.  The pt also  notes dyspnea and is a long term smoker, quit for years and went back to smoking.  The pt has chest pain ongoing since first AMI 3/08.   The  pain is on and off.   The pain is in the epigastric area.  It is a pressure like,  pulling like sensation,  worse if up to mailbox and walking up incline.  The pt iIs out of shape and overweight. The pt gets sweats with walking short distances.  The pt   has to sit down and put up legs.  The pt Quit smoking for  20days then smoked again.   Pt has anxiety issues, lives in a home with smoker.  The pt then  went cold [place] one time and off one year The pt has had bronchitis 8times in past two years.  In the past year there has been  three episodes of bronchitis . The pt  desires a PCP.    Mucus now is green and thick.  Pt referred for further eval.  Preventive Screening-Counseling & Management  Alcohol-Tobacco     Smoking Status: current     Smoking Cessation Counseling: yes     Smoke Cessation Stage: ready     Packs/Day: 0.5  Current Medications (verified): 1)  Benazepril Hcl 40 Mg  Tabs (Benazepril Hcl) .... 1/2 Tablet By Mouth Once Daily 2)  Hydrochlorothiazide 12.5 Mg Tabs (Hydrochlorothiazide) .... Take One Tablet By Mouth Daily. 3)  Metoprolol Tartrate 25 Mg Tabs (Metoprolol Tartrate) .... Take 1 Tablet By Mouth Once A Day 4)  Lipitor 80 Mg Tabs (Atorvastatin Calcium) .... Take One Tablet By Mouth Daily. 5)  Aspirin Ec 325 Mg Tbec (Aspirin) .... Take One Tablet By Mouth Daily 6)  Prilosec Otc 20 Mg Tbec (Omeprazole Magnesium) .... Take 1 Tablet By Mouth Once A Day 7)  Multivitamins   Tabs (Multiple Vitamin) .Marland Kitchen.. 1 Once Daily 8)  B Complex  Tabs (B Complex Vitamins) .... Take 1 Tablet By Mouth Once A Day 9)  Fish Oil 1000 Mg Caps (Omega-3 Fatty Acids) .... Three Times A Day 10)  Citalopram Hydrobromide 40 Mg Tabs (Citalopram Hydrobromide) .... Once Daily 11)  Alprazolam 1 Mg Tabs (Alprazolam) .... Take 1 Tablet By Mouth Two Times A Day As Needed 12)  Nitrostat 0.4 Mg Subl (Nitroglycerin) .... As Directed 13)  Plavix 75 Mg Tabs (Clopidogrel Bisulfate) .... Take One Tablet By Mouth Daily 14)  Risperdal 1 Mg Tabs (Risperidone) .... At Bedtime 15)  Gabapentin 400 Mg Caps (Gabapentin) .... At Bedtime 16)  Vicks Day Time Cold Relief .... As Needed 17)  Vicks Nightime Cold Relief .... As Needed 18)  Menthol Cough Drops .... As Needed  Allergies (verified): 1)  ! Imdur  Past History:  Past medical, surgical, family and social histories (including risk factors) reviewed, and no changes noted (except as noted below).  Past Medical History: Reviewed history from 07/23/2009 and no changes required. 1. CAD      --s/p BMS to RCA and LCX 2008.       --In-stent restenosis of RCA stent rx'd with Xience DES 12/09      --requires extensive sedation for cath      --last cath 11/10: LAD mild plaque. LCX 10% RCA 50% ISR (FFR 0.91/0.92) Distal RCA 40/50  2. Chronic CP  3. Anxiety/depression      --h/o suicide attempt/drug overdose 3/08  4. Hypertension.  5. Obesity.  6. Tobacco use.   7. History of alcohol abuse, now quit.  8. 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  9. Gastroesophageal reflux disease. Arthritis COPD Depression Hyperlipidemia Pneumonia Urinary Tract Infection colitis  Past Surgical History: PTCA-Stent C- section x3 Carpal Tunnel Release colon bx 08/2008 tubal liagation 1982  Family History: Reviewed history from 03/03/2009 and no changes required. Mother h/o CAD s/p stent, emphysema Father h/o CHF,  died lung CA, clotting disorder, emphysema Family History of Colon Cancer:Father Family History of Prostate Cancer:father  Social History: Reviewed history from 03/03/2009 and no changes required. She is single.  She has three children.   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   She has history of tobacco one pack per day for many years.    Also has history of heavy alcohol abuse.  Now reportedly quit.  Denies drug use. Alcohol Use - yes (see notes) Patient is a current smoker. 1 ppd x 79yrs.  Currently down to 6-10 cig/day 3 children  Smoking Status:  current Packs/Day:  0.5  Review of Systems       The patient complains of shortness of breath with activity, productive cough, non-productive cough, chest pain, irregular heartbeats, acid heartburn, indigestion, loss of appetite, weight change, difficulty swallowing, tooth/dental problems, anxiety, depression, hand/feet swelling, and joint stiffness or pain.  The patient denies shortness  of breath at rest, coughing up blood, abdominal pain, sore throat, headaches, nasal congestion/difficulty breathing through nose, sneezing, itching, ear ache, rash, change in color of mucus, and fever.        See HPI for Pulmonary, Cardiac, General, and ENT review of systems.  Vital Signs:  Patient profile:   55 year old female Height:      61 inches Weight:      206.13 pounds BMI:     39.09 O2 Sat:      97 % on Room air Temp:     97.7 degrees F oral Pulse rate:   70 / minute BP sitting:   100 / 58  (right arm) Cuff size:   regular  Vitals Entered By: Gweneth Dimitri RN (January 21, 2010 1:43 PM)  O2 Flow:  Room air  CC: Pulmonary Consult for dyspnea., COPD initial evaluation Comments Medications reviewed with patient Daytime contact number verified with patient. Gweneth Dimitri RN  January 21, 2010 1:49 PM    Physical Exam  General:  obese.   Nose:  clear nasal discharge.   Mouth:  no deformity or lesions Neck:  no masses, thyromegaly, or abnormal cervical nodes Chest Wall:  no deformities noted Lungs:  decreased BS bilateral, wheezes bilateral, and rhonchi bilateral.   Heart:  regular rate and rhythm, S1, S2 without murmurs, rubs, gallops, or clicks Abdomen:  bowel sounds positive; abdomen soft and non-tender without masses, or organomegaly Msk:  no deformity or scoliosis noted with normal posture Pulses:  pulses normal Extremities:  no clubbing, cyanosis, edema, or deformity noted Neurologic:  CN II-XII grossly intact with normal reflexes, coordination, muscle strength and tone  Skin:  intact without lesions or rashes Cervical Nodes:  no significant adenopathy Axillary Nodes:  no significant adenopathy Inguinal Nodes:  no significant adenopathy Psych:  anxious, easily distracted, and poor concentration.     CXR  Procedure date:  09/04/2006  Findings:      Mild peribronchial thickening, else nad  Pulmonary Function Test Date: 01/21/2010 2:05 PM Gender:  Female  Pre-Spirometry FVC    Value: 2.22 L/min   % Pred: 72.30 % FEV1    Value: 1.68 L     Pred: 2.42 L     % Pred: 69.30 % FEV1/FVC  Value: 75.54 %     % Pred: 95 %  Impression & Recommendations:  Problem # 1:  COPD (ICD-496) Assessment Deteriorated COPD by hx and exam.  Pt with moderate obstruction on spirometry and chronic bronchitic changes on CXR.   Pt with ongoing smoking abuse. Psychiatric hx precludes Chantix use.  Need to avoid steroids with psych hx.  Ongoing tracheobronchitis noted, also ace inhibitor contributes to coughing spells  plan smoking cessation with nicotine replacement therapy. Pt given smoking cessation counselling d/c benzapril with ongoing cough start ARB benciar 40mg /d start laba/ics>>>symbicort 160 two puff two times a day start doxycycline 100mg  two times a day rov 2 months  Problem # 2:  TOBACCO ABUSE (ICD-305.1) Assessment: Unchanged see assessment number one  Medications Added to Medication List This Visit: 1)  Benazepril Hcl 40 Mg Tabs (Benazepril hcl) .Marland Kitchen.. 1  tablet by mouth once daily 2)  Benicar 40 Mg Tabs (Olmesartan medoxomil) .... One tablet by mouth daily 3)  Metoprolol Tartrate 25 Mg Tabs (Metoprolol tartrate) .... Take 1 tablet by mouth once a day 4)  Prilosec Otc 20 Mg Tbec (Omeprazole magnesium) .... Take 1 tablet by mouth once a day 5)  B Complex Tabs (B complex vitamins) .... Take 1 tablet by mouth once a day 6)  Fish Oil 1000 Mg Caps (Omega-3 fatty acids) .... Three times a day 7)  Alprazolam 1 Mg Tabs (Alprazolam) .... Take 1 tablet by mouth two times a day as needed 8)  Vicks Day Time Cold Relief  .... As needed 9)  Vicks Nightime Cold Relief  .... As needed 10)  Menthol Cough Drops  .... As needed 11)  Symbicort 160-4.5 Mcg/act Aero (Budesonide-formoterol fumarate) .... Two puffs twice daily 12)  Doxycycline Monohydrate 100 Mg Caps (Doxycycline monohydrate) .... By mouth twice daily  Complete Medication List: 1)  Benicar 40 Mg  Tabs (Olmesartan medoxomil) .... One tablet by mouth daily 2)  Hydrochlorothiazide 12.5 Mg Tabs (Hydrochlorothiazide) .... Take one tablet by mouth daily. 3)  Metoprolol Tartrate 25 Mg Tabs (  Metoprolol tartrate) .... Take 1 tablet by mouth once a day 4)  Lipitor 80 Mg Tabs (Atorvastatin calcium) .... Take one tablet by mouth daily. 5)  Aspirin Ec 325 Mg Tbec (Aspirin) .... Take one tablet by mouth daily 6)  Prilosec Otc 20 Mg Tbec (Omeprazole magnesium) .... Take 1 tablet by mouth once a day 7)  Multivitamins Tabs (Multiple vitamin) .Marland Kitchen.. 1 once daily 8)  B Complex Tabs (B complex vitamins) .... Take 1 tablet by mouth once a day 9)  Fish Oil 1000 Mg Caps (Omega-3 fatty acids) .... Three times a day 10)  Citalopram Hydrobromide 40 Mg Tabs (Citalopram hydrobromide) .... Once daily 11)  Alprazolam 1 Mg Tabs (Alprazolam) .... Take 1 tablet by mouth two times a day as needed 12)  Nitrostat 0.4 Mg Subl (Nitroglycerin) .... As directed 13)  Plavix 75 Mg Tabs (Clopidogrel bisulfate) .... Take one tablet by mouth daily 14)  Risperdal 1 Mg Tabs (Risperidone) .... At bedtime 15)  Gabapentin 400 Mg Caps (Gabapentin) .... At bedtime 16)  Vicks Day Time Cold Relief  .... As needed 17)  Vicks Nightime Cold Relief  .... As needed 18)  Menthol Cough Drops  .... As needed 19)  Symbicort 160-4.5 Mcg/act Aero (Budesonide-formoterol fumarate) .... Two puffs twice daily 20)  Doxycycline Monohydrate 100 Mg Caps (Doxycycline monohydrate) .... By mouth twice daily  Other Orders: Spirometry w/Graph (94010) New Patient Level V (40981) Tobacco use cessation intensive >10 minutes (19147)  Patient Instructions: 1)  Start doxycycline one twice a day for 7 days 2)  Start Symbicort two puff twice daily 3)  Stop benzepril 4)  Start Benicar one daily 5)  Stop smoking, use nicotine patch or gum 6)  Return 2 months Prescriptions: DOXYCYCLINE MONOHYDRATE 100 MG  CAPS (DOXYCYCLINE MONOHYDRATE) By mouth twice daily  #14 x  0   Entered and Authorized by:   Storm Frisk MD   Signed by:   Storm Frisk MD on 01/21/2010   Method used:   Electronically to        [facility] [address] (retail)       [phone]       [place], [place]  [address]       Ph: [phone]       Fax: [phone]   RxID:   [id number] SYMBICORT 160-4.5 MCG/ACT  AERO (BUDESONIDE-FORMOTEROL FUMARATE) Two puffs twice daily  #1 x 6   Entered and Authorized by:   Storm Frisk MD   Signed by:   Storm Frisk MD on 01/21/2010   Method used:   Electronically to        [facility] [address] (retail)       [phone]       [place], [place]  [address]       Ph: [phone]       Fax: [phone]   RxID:   [id number] BENICAR 40 MG  TABS (OLMESARTAN MEDOXOMIL) One tablet by mouth daily  #30 x 6   Entered and Authorized by:   Storm Frisk MD   Signed by:   Storm Frisk MD on 01/21/2010   Method used:   Print then Give to Patient   RxID:   [id number]      CardioPerfect Spirometry  ID: [id number] Patient: Kara Lamb DOB: [birth year] Age: 55 Years Old Sex: Female Race: [place] Asters  Height: 61 Weight: 206.13 PPD: 0.5 Status: Confirmed Past Medical History:  1. CAD      --s/p BMS to RCA and LCX 2008.      --In-stent restenosis of RCA stent rx'd with Xience DES 12/09      --requires extensive sedation for cath      --last cath 11/10: LAD mild plaque. LCX 10% RCA 50% ISR (FFR 0.91/0.92) Distal RCA 40/50  2. Chronic CP  3. Anxiety/depression      --h/o suicide attempt/drug overdose 3/08  4. Hypertension.  5. Obesity.  6. Tobacco use.   7. History of alcohol abuse, now quit.  8. 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  9. Gastroesophageal reflux disease. Arthritis COPD Depression Hyperlipidemia Pneumonia Urinary Tract  Infection colitis  Recorded: 01/21/2010 2:05 PM  Parameter  Measured Predicted %Predicted FVC     2.22        3.08        72.30 FEV1     1.68        2.42        69.30 FEV1%   75.54        79.55        95 PEF    2.89        6.12        47.20   Comments: mild restriction, moderate peripheral obstruction  Interpretation: Pre: FVC= 2.22L FEV1= 1.68L FEV1%= 75.5% 1.68/2.22 FEV1/FVC (01/21/2010 2:08:31 PM), Mild restriction

## 2010-07-14 NOTE — Progress Notes (Signed)
Summary: plavix and lipitor   Phone Note Outgoing Call   Call placed by: Meredith Staggers, RN,  January 19, 2010 2:16 PM Call placed to: Patient Summary of Call: pts meds (plavix and lipitor) have both arrived from company, she is aware and will pick up, she also request a list of her meds be left for her

## 2010-07-14 NOTE — Progress Notes (Signed)
Summary: PLAVIX  Medications Added PLAVIX 75 MG TABS (CLOPIDOGREL BISULFATE) Take one tablet by mouth daily       Phone Note Call from Patient Call back at Home Phone [phone]   Caller: Patient Reason for Call: Talk to Nurse Summary of Call: PT IS OUT OF PLAVIX, WONDERING WHAT THE STATUS IS OF THE APPLICATION, CAN SHE HAVE SOME SAMPLES Initial call taken by: Migdalia Dk,  August 04, 2009 11:15 AM  Follow-up for Phone Call        Plavix was approved and shipped on 2/17, will leave samples for pt at front desk and will call her when hers arrives, she states she also needs lipitor, order was placed w/pfizer    New/Updated Medications: PLAVIX 75 MG TABS (CLOPIDOGREL BISULFATE) Take one tablet by mouth daily Prescriptions: PLAVIX 75 MG TABS (CLOPIDOGREL BISULFATE) Take one tablet by mouth daily  #4 x 0   Entered by:   Meredith Staggers, RN   Authorized by:   Dolores Patty, MD, [facility]   Signed by:   Meredith Staggers, RN on 08/04/2009   Method used:   Samples Given   RxID:   [id number]

## 2010-07-14 NOTE — Letter (Signed)
Summary: [employer]   Imported By: Marylou Mccoy 08/25/2009 11:54:39  _____________________________________________________________________  External Attachment:    Type:   Image     Comment:   External Document

## 2010-07-14 NOTE — Progress Notes (Signed)
Summary: Nuclear Pre-Procedure  Phone Note Outgoing Call Call back at [phone] Sister-Amy   Call placed by: [name], EMT-P,  December 30, 2009 12:01 PM Summary of Call: Home number is not receiving message; Left message on sister's voice mail to have the Patient call us for information on Myov[facility] Information Sheet (see scanned document for details).  As of 2:38, no return call. [name], EMT-P  December 30, 2009 2:39 PM     Nuclear Med Background Indications for Stress Test: Evaluation for Ischemia, Stent Patency   History: COPD, Heart Catheterization, Stents  History Comments: '08 Stents: RCA, CFX 11/10 Heart Cath: N/O CAD  Symptoms: Chest Pain, DOE    Nuclear Pre-Procedure Cardiac Risk Factors: Hypertension, Lipids, Smoker Height (in): 62

## 2010-07-14 NOTE — Progress Notes (Signed)
Summary: Order lipitor   Phone Note Refill Request Message from:  Patient on April 28, 2010 4:14 PM  Refills Requested: Medication #1:  LIPITOR 80 MG TABS Take one tablet by mouth daily. order for pt  Initial call taken by: Judie Grieve,  April 28, 2010 4:14 PM Caller: Patient  Follow-up for Phone Call        it is time to renew application have completed form, need pt to come by and sign and give proof of income, have called pt and left mess for her to call back Meredith Staggers, RN  April 28, 2010 5:32 PM   Left message to call back Meredith Staggers, RN  April 30, 2010 2:27 PM   Additional Follow-up for Phone Call Additional follow up Details #1::        pts plavix arrived from [facility] left message med at front desk for pt to call back to, she needs to come by and sign application for Lipitor Meredith Staggers, RN  May 04, 2010 1:43 PM   left mess on VM that Plavix and application for Lipitor are at front desk Meredith Staggers, RN  May 06, 2010 10:51 AM

## 2010-07-14 NOTE — Assessment & Plan Note (Signed)
Summary: 6mon f/u per check out/ob  Medications Added FISH OIL   OIL (FISH OIL) 1000mg  4 caps daily ABILIFY 2 MG TABS (ARIPIPRAZOLE) 1 tab once daily for 2 weeks then 5mg  once daily after MAG-OX 400 400 MG TABS (MAGNESIUM OXIDE) 1/2 tab daily      Allergies Added:   Referring Provider:  Valma Cava, MD Primary Provider:  n/a  CC:  periodic chest tightness.  History of Present Illness: This is a 55 year old white female patient, who has a history of recurrent chest pain. She's had previous stenting of her RCA and left circumflex complicated by in-stent restenosis of her RCA back in 2009. At that time. She underwent drug-eluting stent to her RCA. Her last cardiac catheterization in March of 2010 for recurrent chest pain showed patent RCA and circumflex stents. Normal LV function. Medical therapy was recommended.  Also with h/o HTN, HL, obesit, reflex sympathetic dystrophy and suicidal depression  [place] Had cath on 05/06/09. LAD mild plaque. LCX 10% RCA 50% ISR (FFR 0.91/0.92) Distal RCA 40/50  BP much improved. Still with occasional CP About 1x/week. Dyspnea on moderate exertion. Both are normal for her. Still smoking 5 cigs/day. Very worried that stent is closing up. Ability to walk limited by foot pain and her breathing  Lipid Management History:      Positive NCEP/ATP III risk factors include HDL cholesterol less than 40, hypertension, and ASHD (either angina/prior MI/prior CABG).  Negative NCEP/ATP III risk factors include female age less than 76 years old and non-tobacco-user status.    Current Medications (verified): 1)  Benazepril Hcl 40 Mg  Tabs (Benazepril Hcl) .Marland Kitchen.. 1 Tablet By Mouth Twice A Day 2)  Hydrochlorothiazide 12.5 Mg Tabs (Hydrochlorothiazide) .... Take One Tablet By Mouth Daily. 3)  Metoprolol Tartrate 25 Mg Tabs (Metoprolol Tartrate) .... Take One Tablet By Mouth Twice A Day 4)  Lipitor 80 Mg Tabs (Atorvastatin Calcium) .... Take  One Tablet By Mouth Daily. 5)  Aspirin Ec 325 Mg Tbec (Aspirin) .... Take One Tablet By Mouth Daily 6)  Prilosec Otc 20 Mg Tbec (Omeprazole Magnesium) .... One By Mouth Two Times A Day 7)  Multivitamins   Tabs (Multiple Vitamin) .Marland Kitchen.. 1 Once Daily 8)  Vitamin B Complex-C   Caps (B Complex-C) 9)  Fish Oil   Oil (Fish Oil) .... 1000mg  3 Once Daily 10)  Proventil Hfa 108 (90 Base) Mcg/act Aers (Albuterol Sulfate) .... Inhale 2 Puffs Every 4 Hours As Needed 11)  Celexa 20 Mg Tabs (Citalopram Hydrobromide) .... Take 1 1/2 By Mouth Once Daily 12)  Alprazolam 1 Mg Tabs (Alprazolam) .... Take 1/2 To 1 Tablet By Mouth Once Daily 13)  Nitrostat 0.4 Mg Subl (Nitroglycerin) .... As Directed 14)  Abilify 2 Mg Tabs (Aripiprazole) .Marland Kitchen.. 1 Tab Once Daily For 2 Weeks Then 5mg  Once Daily After  Allergies (verified): 1)  ! Imdur  Past History:  Past Medical History: 1. CAD      --s/p BMS to RCA and LCX 2008.      --In-stent restenosis of RCA stent rx'd with Xience DES 12/09      --requires extensive sedation for cath      --last cath 11/10: LAD mild plaque. LCX 10% RCA 50% ISR (FFR 0.91/0.92) Distal RCA 40/50  2. Chronic CP  3. Anxiety/depression      --h/o suicide attempt/drug overdose 3/08  4. Hypertension.  5. Obesity.  6. Tobacco use.   7. History of alcohol abuse,  now quit.  8. 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  9. Gastroesophageal reflux disease. Arthritis COPD Depression Hyperlipidemia Pneumonia Urinary Tract Infection colitis  Review of Systems       As per HPI and past medical history; otherwise all systems negative.   Vital Signs:  Patient profile:   55 year old female Height:      62 inches Weight:      196 pounds BMI:     35.98 Pulse rate:   75 / minute BP sitting:   124 / 62  (left arm) Cuff size:   regular  Vitals Entered By: [name], RMA (July 23, 2009  2:37 PM)  Physical Exam  General:   Well-nournished, in no acute distress. Neck: No JVD, HJR, Bruit, or thyroid enlargement Lungs: No tachypnea, clear without wheezing, rales, or rhonchi Cardiovascular: RRR, PMI not displaced, heart sounds normal, no murmurs, gallops, bruit, thrill, or heave. Abdomen: BS normal. obese. Soft without organomegaly, masses, lesions or tenderness. Extremities: without cyanosis, clubbing or edema. Good distal pulses bilateral SKin: Warm, no lesions or rashes  Musculoskeletal: No deformities Neuro: no focal signs    New Orders:     1)  EKG w/ Interpretation (93000)   Impression & Recommendations:  Problem # 1:  CAD, NATIVE VESSEL (ICD-414.01) Symptoms are stable. Did have some in-stent restensosis on recent cath. Has chronic CP so hard to tell is ischemic or not. Will see her back in 6 months with Myoview to evlauate for disease progression. Can see sooner if synptoms progressing. Continue plavix.  Problem # 2:  HYPERLIPIDEMIA-MIXED (ICD-272.4) LDL at goal. HDL remains. low. Will increase fish oil to 4g per day. Discussed fenofibrate but cost is issue.   Problem # 3:  TOBACCO ABUSE (ICD-305.1) Counseled on need to stop smoking.  Problem # 4:  HYPERTENSION, BENIGN (ICD-401.1) Blood pressure well controlled. Continue current regimen.  Other Orders: EKG w/ Interpretation (93000)  Lipid Assessment/Plan:      Based on NCEP/ATP III, the patient's risk factor category is "history of coronary disease, peripheral vascular disease, cerebrovascular disease, or aortic aneurysm".  The patient's lipid goals are as follows: Total cholesterol goal is 200; LDL cholesterol goal is 100; HDL cholesterol goal is 40; Triglyceride goal is 150.     Patient Instructions: 1)  Increase Fish oil to 4 caps daily 2)  Start Mag-ox 400mg  1/2 tab daily 3)  Follow up in 6 months Prescriptions: MAG-OX 400 400 MG TABS (MAGNESIUM OXIDE) 1/2 tab daily  #15 x 6   Entered by:    Meredith Staggers, RN   Authorized by:   Dolores Patty, MD, [facility]   Signed by:   Meredith Staggers, RN on 07/23/2009   Method used:   Electronically to        [facility] [address] (retail)       [phone]       [place], [place]  [address]       Ph: [phone]       Fax: [phone]   RxID:   [id number]    Reports Reviewed:  Lipid Panel: 07/18/2009:  Chol:  110   LDL:  59   HDL:  32.20    87/56/4332:  Chol:  185   LDL:  124   HDL:  32.4    95/18/8416:  Chol:  147   LDL:  85   HDL:  36.9    03/07/2007:  Chol:  155   LDL:  91   HDL:  35.6

## 2010-07-14 NOTE — Progress Notes (Signed)
   Recieved Request for Records form DDS forwarded to [facility] for processing [facility]  August 28, 2009 12:55 PM

## 2010-07-14 NOTE — Progress Notes (Signed)
Summary: plavix   Phone Note Call from Patient Call back at Home Phone [phone]   Caller: Patient Reason for Call: Talk to Nurse Summary of Call: req to speak to nurse about plavix, will not have enough till order comes in, do you have any samples Initial call taken by: Migdalia Dk,  August 07, 2009 1:04 PM  Follow-up for Phone Call        Plavix and Lipitor both here pt aware to pick up Meredith Staggers, RN  August 07, 2009 3:55 PM

## 2010-07-14 NOTE — Progress Notes (Signed)
Summary: samples   Phone Note Call from Patient Call back at [phone]   Caller: Patient Reason for Call: Talk to Nurse Summary of Call: checking to see if samples are in yet Initial call taken by: Migdalia Dk,  Nov 05, 2009 2:06 PM  Follow-up for Phone Call        pt aware meds are at the front desk for pick up Meredith Staggers, RN  Nov 05, 2009 2:35 PM

## 2010-07-14 NOTE — Letter (Signed)
Summary: Appointment - Reschedule  [facility]     [place], [place]    Phone:   Fax:      December 19, 2009 MRN: [id number]   [name] [address] [place], [place]  [address]   Dear [name],   Due to a change in our office schedule, your appointment on  January 06, 2010 at 11:45               must be changed.  It is very important that we reach you to reschedule this appointment. We look forward to participating in your health care needs. Please contact us at the number listed above at your earliest convenience to reschedule this appointment.     Sincerely,      Lorne Skeens  [facility] [place] [facility] Scheduling Team

## 2010-07-14 NOTE — Assessment & Plan Note (Signed)
Summary: Pulmonary OV   Copy to:  Dr. Gala Romney Primary Provider/Referring Provider:  n/a  CC:  2 month follow up.  Pt states she still has occ SOB with exertion and wheezing at times but overall breathing is "much better."  Dry cough.  Denies chest tightness.  has not smoked in almost 1 month.  Requesting flu vac today.Marland Kitchen  History of Present Illness: Pulmonary Consultation       55 yo WF with Golds Stage I COPD  Pt with Dx CAD.  Pt notes last heart cath 11/10  , the pt developed small blockages over 11months.  The pt still has recurring chest pain.  Cardiology feels is non cardiac.  UGI was recommended but pt wanted pulm eval first.  The pt also  notes dyspnea and is a long term smoker, quit for years and went back to smoking.  The pt has chest pain ongoing since first AMI 3/08.   The  pain is on and off.   The pain is in the epigastric area.  It is a pressure like,  pulling like sensation,  worse if up to mailbox and walking up incline.  The pt iIs out of shape and overweight. The pt gets sweats with walking short distances.  The pt   has to sit down and put up legs.  The pt Quit smoking for  20days then smoked again.   Pt has anxiety issues, lives in a home with smoker.  The pt then  went cold [place] one time and off one year The pt has had bronchitis 8times in past two years.  In the past year there has been  three episodes of bronchitis . The pt desires a PCP.    Mucus now is green and thick.  Pt referred for further eval.March 25, 2010 2:05 PM at last ov we rec: Start doxycycline one twice a day for 7 days Start Symbicort two puff twice daily Stop benzepril Start Benicar one daily Stop smoking, use nicotine patch or gum The pt is now off smoking!!!   down to three to five nicorette gum/day  Pt is better with less dyspnea and cough.  No excess mucus.   Preventive Screening-Counseling & Management  Alcohol-Tobacco     Smoking Status: quit < 6 months  Current Medications  (verified): 1)  Benicar 40 Mg  Tabs (Olmesartan Medoxomil) .... One Tablet By Mouth Daily 2)  Hydrochlorothiazide 12.5 Mg Tabs (Hydrochlorothiazide) .... Take One Tablet By Mouth Daily. 3)  Metoprolol Tartrate 25 Mg Tabs (Metoprolol Tartrate) .... Take 1 Tablet By Mouth Once A Day 4)  Lipitor 80 Mg Tabs (Atorvastatin Calcium) .... Take One Tablet By Mouth Daily. 5)  Aspirin Ec 325 Mg Tbec (Aspirin) .... Take One Tablet By Mouth Daily 6)  Prilosec Otc 20 Mg Tbec (Omeprazole Magnesium) .... Take 1 Tablet By Mouth Once A Day 7)  Multivitamins   Tabs (Multiple Vitamin) .Marland Kitchen.. 1 Once Daily 8)  B Complex  Tabs (B Complex Vitamins) .... Take 1 Tablet By Mouth Once A Day 9)  Fish Oil 1000 Mg Caps (Omega-3 Fatty Acids) .... Three Times A Day 10)  Citalopram Hydrobromide 40 Mg Tabs (Citalopram Hydrobromide) .... Once Daily 11)  Alprazolam 1 Mg Tabs (Alprazolam) .... Take 1 Tablet By Mouth Two Times A Day As Needed 12)  Nitrostat 0.4 Mg Subl (Nitroglycerin) .... As Directed 13)  Plavix 75 Mg Tabs (Clopidogrel Bisulfate) .... Take One Tablet By Mouth Daily 14)  Gabapentin 400 Mg Caps (Gabapentin) .Marland KitchenMarland KitchenMarland Kitchen  At Bedtime 15)  Vicks Day Time Cold Relief .... As Needed 16)  Vicks Nightime Cold Relief .... As Needed 17)  Menthol Cough Drops .... As Needed 18)  Symbicort 160-4.5 Mcg/act  Aero (Budesonide-Formoterol Fumarate) .... Two Puffs Twice Daily 19)  Topiramate 25 Mg Tabs (Topiramate) .... As Directed 20)  Loratadine 10 Mg Tabs (Loratadine) .... Take 1 Tablet By Mouth Once A Day  Allergies (verified): 1)  ! Imdur  Past History:  Past medical, surgical, family and social histories (including risk factors) reviewed, and no changes noted (except as noted below).  Past Medical History: Reviewed history from 07/23/2009 and no changes required. 1. CAD      --s/p BMS to RCA and LCX 2008.      --In-stent restenosis of RCA stent rx'd with Xience DES 12/09      --requires extensive sedation for cath      --last  cath 11/10: LAD mild plaque. LCX 10% RCA 50% ISR (FFR 0.91/0.92) Distal RCA 40/50  2. Chronic CP  3. Anxiety/depression      --h/o suicide attempt/drug overdose 3/08  4. Hypertension.  5. Obesity.  6. Tobacco use.   7. History of alcohol abuse, now quit.  8. 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  9. Gastroesophageal reflux disease. Arthritis COPD Depression Hyperlipidemia Pneumonia Urinary Tract Infection colitis  Past Surgical History: Reviewed history from 01/21/2010 and no changes required. PTCA-Stent C- section x3 Carpal Tunnel Release colon bx 08/2008 tubal liagation 1982  Family History: Reviewed history from 01/21/2010 and no changes required. Mother h/o CAD s/p stent, emphysema Father h/o CHF,  died lung CA, clotting disorder, emphysema Family History of Colon Cancer:Father Family History of Prostate Cancer:father  Social History: Reviewed history from 01/21/2010 and no changes required. She is single.  She has three children.   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   She has history of tobacco one pack per day for many years.    Also has history of heavy alcohol abuse.  Now reportedly quit.  Denies drug use. Alcohol Use - yes (see notes) Patient is a current smoker. 1 ppd x 4yrs.  Currently down to 6-10 cig/day 3 children  Smoking Status:  quit < 6 months  Review of Systems       The patient complains of shortness of breath with activity and non-productive cough.  The patient denies shortness of breath at rest, productive cough, coughing up blood, chest pain, irregular heartbeats, acid heartburn, indigestion, loss of appetite, weight change, abdominal pain, difficulty swallowing, sore throat, tooth/dental problems, headaches, nasal congestion/difficulty breathing through nose,  sneezing, itching, ear ache, anxiety, depression, hand/feet swelling, joint stiffness or pain, rash, change in color of mucus, and fever.    Vital Signs:  Patient profile:   55 year old female Height:      61 inches Weight:      203.13 pounds BMI:     38.52 O2 Sat:      97 % on Room air Temp:     98.1 degrees F oral Pulse rate:   57 / minute BP sitting:   106 / 72  (left arm) Cuff size:   regular  Vitals Entered By: Gweneth Dimitri RN (March 25, 2010 1:56 PM)  Nutrition Counseling: Patient's BMI is greater than 25 and therefore counseled on weight management  options.  O2 Flow:  Room air CC: 2 month follow up.  Pt states she still has occ SOB with exertion and wheezing at times but overall breathing is "much better."  Dry cough.  Denies chest tightness.  has not smoked in almost 1 month.  Requesting flu vac today. Comments Medications reviewed with patient Daytime contact number verified with patient. Gweneth Dimitri RN  March 25, 2010 1:51 PM    Physical Exam  General:  obese.   Nose:  clear nasal discharge.   Mouth:  no deformity or lesions Neck:  no masses, thyromegaly, or abnormal cervical nodes Chest Wall:  no deformities noted Lungs:  decreased BS bilateral.   Heart:  regular rate and rhythm, S1, S2 without murmurs, rubs, gallops, or clicks Abdomen:  bowel sounds positive; abdomen soft and non-tender without masses, or organomegaly Msk:  no deformity or scoliosis noted with normal posture Pulses:  pulses normal Extremities:  no clubbing, cyanosis, edema, or deformity noted Neurologic:  CN II-XII grossly intact with normal reflexes, coordination, muscle strength and tone  Skin:  intact without lesions or rashes Cervical Nodes:  no significant adenopathy Axillary Nodes:  no significant adenopathy Psych:  anxious, easily distracted, and poor concentration.     Pre-Spirometry FVC    % Pred: 0 % FEV1    % Pred: 0 %  Impression & Recommendations:  Problem # 1:   COPD (ICD-496) Assessment Improved  COPD by hx and exam.  Pt with moderate obstruction on spirometry and chronic bronchitic changes on CXR.  Now improved off cigarettes using nicotine replacement Rx, also cough better off Ace inhibitors and GERD Rx  plan Reduce symbicort to one puff two times a day  Maintain treatment program as currently prescribed. maintain off smoking  Medications Added to Medication List This Visit: 1)  Benicar Hct 40-12.5 Mg Tabs (Olmesartan medoxomil-hctz) .... One tablet by mouth daily 2)  Symbicort 160-4.5 Mcg/act Aero (Budesonide-formoterol fumarate) .... One puff  twice daily 3)  Topiramate 25 Mg Tabs (Topiramate) .... 50mg  two times a day goal dose 4)  Topiramate 25 Mg Tabs (Topiramate) .... As directed 5)  Loratadine 10 Mg Tabs (Loratadine) .... Take 1 tablet by mouth once a day  Complete Medication List: 1)  Benicar Hct 40-12.5 Mg Tabs (Olmesartan medoxomil-hctz) .... One tablet by mouth daily 2)  Metoprolol Tartrate 25 Mg Tabs (Metoprolol tartrate) .... Take 1 tablet by mouth once a day 3)  Lipitor 80 Mg Tabs (Atorvastatin calcium) .... Take one tablet by mouth daily. 4)  Aspirin Ec 325 Mg Tbec (Aspirin) .... Take one tablet by mouth daily 5)  Prilosec Otc 20 Mg Tbec (Omeprazole magnesium) .... Take 1 tablet by mouth once a day 6)  Multivitamins Tabs (Multiple vitamin) .Marland Kitchen.. 1 once daily 7)  B Complex Tabs (B complex vitamins) .... Take 1 tablet by mouth once a day 8)  Fish Oil 1000 Mg Caps (Omega-3 fatty acids) .... Three times a day 9)  Citalopram Hydrobromide 40 Mg Tabs (Citalopram hydrobromide) .... Once daily 10)  Alprazolam 1 Mg Tabs (Alprazolam) .... Take 1 tablet by mouth two times a day as needed 11)  Nitrostat 0.4 Mg Subl (Nitroglycerin) .... As directed 12)  Plavix 75 Mg Tabs (Clopidogrel bisulfate) .... Take one tablet by mouth daily 13)  Gabapentin 400 Mg Caps (Gabapentin) .... At bedtime 14)  Vicks Day Time Cold Relief  .... As needed 15)   Vicks Nightime Cold Relief  .... As needed 16)  Menthol Cough  Drops  .... As needed 17)  Symbicort 160-4.5 Mcg/act Aero (Budesonide-formoterol fumarate) .... One puff  twice daily 18)  Topiramate 25 Mg Tabs (Topiramate) .... 50mg  two times a day goal dose 19)  Loratadine 10 Mg Tabs (Loratadine) .... Take 1 tablet by mouth once a day  Other Orders: Est. Patient Level III (04540)  Patient Instructions: 1)  Reduce symbicort to one puff twice daily 2)  Substitute Benicar HCT one daily for Benicar and HCTZ 3)  Return 3 months Prescriptions: BENICAR HCT 40-12.5 MG  TABS (OLMESARTAN MEDOXOMIL-HCTZ) One tablet by mouth daily  #90 x 4   Entered and Authorized by:   Storm Frisk MD   Signed by:   Storm Frisk MD on 03/25/2010   Method used:   Print then Give to Patient   RxID:   [id number]

## 2010-07-14 NOTE — Progress Notes (Signed)
Summary: primary doctor  Phone Note Call from Patient Call back at Home Phone [phone]   Caller: Patient Call For: wright Summary of Call: Wants PW to recommend a primary MD for her. Initial call taken by: Darletta Moll,  January 21, 2010 3:22 PM  Follow-up for Phone Call        PW, any suggestions? Thanks.Reynaldo Minium CMA  January 21, 2010 3:34 PM   Additional Follow-up for Phone Call Additional follow up Details #1::        I would suggest Rene Paci in [facility] [place]. Additional Follow-up by: Storm Frisk MD,  January 21, 2010 4:36 PM    Additional Follow-up for Phone Call Additional follow up Details #2::    LMTCB.Reynaldo Minium CMA  January 21, 2010 4:54 PM   Spoke with pt and made aware of the above recs per PW. Gave her number to call to sched appt with Dr Felicity Coyer. Follow-up by: Vernie Murders,  January 22, 2010 9:58 AM

## 2010-07-14 NOTE — Progress Notes (Signed)
Summary: CHANGE PRESCRIPTION DUE TO MEDICAID  Medications Added MAGNESIUM OXIDE 250 MG TABS (MAGNESIUM OXIDE) Take 1 tablet by mouth once a day       Phone Note Call from Patient Call back at Home Phone [phone]   Caller: Patient Summary of Call: PT CALLING REGARDING MEDICATION MAGNESIUM OXIDE PT CAN NOT HALF THIS MEDICATION THEY POWDER UP WANT TO KNOW IF IF THE PRESCRITION CAN BE CHANGED [facility] [phone] NEED IT TO BE OVER THE COUNTER PRESCRIPTION DUE TO  MEDICAIDE Initial call taken by: Judie Grieve,  August 18, 2009 12:44 PM  Follow-up for Phone Call        pt need order for OTC mag ox rx sent in [place], RN  August 18, 2009 1:00 PM     New/Updated Medications: MAGNESIUM OXIDE 250 MG TABS (MAGNESIUM OXIDE) Take 1 tablet by mouth once a day Prescriptions: MAGNESIUM OXIDE 250 MG TABS (MAGNESIUM OXIDE) Take 1 tablet by mouth once a day  #30 x 6   Entered by:   Meredith Staggers, RN   Authorized by:   Dolores Patty, MD, [facility]   Signed by:   Meredith Staggers, RN on 08/18/2009   Method used:   Electronically to        [facility] [address] (retail)       [phone]       [place], [place]  [address]       Ph: [phone]       Fax: [phone]   RxID:   [id number]

## 2010-07-15 ENCOUNTER — Encounter (INDEPENDENT_AMBULATORY_CARE_PROVIDER_SITE_OTHER): Payer: Self-pay | Admitting: *Deleted

## 2010-07-15 ENCOUNTER — Inpatient Hospital Stay (HOSPITAL_COMMUNITY)
Admission: EM | Admit: 2010-07-15 | Discharge: 2010-07-17 | DRG: 287 | Disposition: A | Discharge status: Home / Self Care | Payer: Medicare Other | Attending: Cardiology | Admitting: Cardiology

## 2010-07-15 ENCOUNTER — Emergency Department (HOSPITAL_COMMUNITY): Payer: Medicare Other

## 2010-07-15 ENCOUNTER — Ambulatory Visit: Payer: Self-pay | Admitting: Physician Assistant

## 2010-07-15 DIAGNOSIS — Z79899 Other long term (current) drug therapy: Secondary | ICD-10-CM

## 2010-07-15 DIAGNOSIS — Z23 Encounter for immunization: Secondary | ICD-10-CM

## 2010-07-15 DIAGNOSIS — R0789 Other chest pain: Principal | ICD-10-CM | POA: Diagnosis present

## 2010-07-15 DIAGNOSIS — E785 Hyperlipidemia, unspecified: Secondary | ICD-10-CM | POA: Diagnosis present

## 2010-07-15 DIAGNOSIS — J449 Chronic obstructive pulmonary disease, unspecified: Secondary | ICD-10-CM | POA: Diagnosis present

## 2010-07-15 DIAGNOSIS — F1011 Alcohol abuse, in remission: Secondary | ICD-10-CM | POA: Diagnosis present

## 2010-07-15 DIAGNOSIS — K219 Gastro-esophageal reflux disease without esophagitis: Secondary | ICD-10-CM | POA: Diagnosis present

## 2010-07-15 DIAGNOSIS — G90519 Complex regional pain syndrome I of unspecified upper limb: Secondary | ICD-10-CM | POA: Diagnosis present

## 2010-07-15 DIAGNOSIS — J4489 Other specified chronic obstructive pulmonary disease: Secondary | ICD-10-CM | POA: Diagnosis present

## 2010-07-15 DIAGNOSIS — F341 Dysthymic disorder: Secondary | ICD-10-CM | POA: Diagnosis present

## 2010-07-15 DIAGNOSIS — I2510 Atherosclerotic heart disease of native coronary artery without angina pectoris: Secondary | ICD-10-CM | POA: Diagnosis present

## 2010-07-15 DIAGNOSIS — I252 Old myocardial infarction: Secondary | ICD-10-CM

## 2010-07-15 DIAGNOSIS — M1990 Unspecified osteoarthritis, unspecified site: Secondary | ICD-10-CM | POA: Diagnosis present

## 2010-07-15 DIAGNOSIS — Z9861 Coronary angioplasty status: Secondary | ICD-10-CM

## 2010-07-15 DIAGNOSIS — G43909 Migraine, unspecified, not intractable, without status migrainosus: Secondary | ICD-10-CM | POA: Diagnosis present

## 2010-07-15 DIAGNOSIS — Z7982 Long term (current) use of aspirin: Secondary | ICD-10-CM

## 2010-07-15 DIAGNOSIS — E669 Obesity, unspecified: Secondary | ICD-10-CM | POA: Diagnosis present

## 2010-07-15 DIAGNOSIS — Z7902 Long term (current) use of antithrombotics/antiplatelets: Secondary | ICD-10-CM

## 2010-07-15 DIAGNOSIS — I959 Hypotension, unspecified: Secondary | ICD-10-CM | POA: Diagnosis present

## 2010-07-15 DIAGNOSIS — Z8249 Family history of ischemic heart disease and other diseases of the circulatory system: Secondary | ICD-10-CM

## 2010-07-15 DIAGNOSIS — Z87891 Personal history of nicotine dependence: Secondary | ICD-10-CM

## 2010-07-15 LAB — CK TOTAL AND CKMB (NOT AT ARMC)
CK, MB: 1.8 ng/mL (ref 0.3–4.0)
Relative Index: 1.5 (ref 0.0–2.5)
Total CK: 117 U/L (ref 7–177)

## 2010-07-15 LAB — CARDIAC PANEL(CRET KIN+CKTOT+MB+TROPI): Total CK: 115 U/L (ref 7–177)

## 2010-07-15 LAB — BRAIN NATRIURETIC PEPTIDE: Pro B Natriuretic peptide (BNP): <30 pg/mL (ref 0.0–100.0)

## 2010-07-15 LAB — BASIC METABOLIC PANEL
BUN: 15 mg/dL (ref 6–23)
CO2: 27 mEq/L (ref 19–32)
Calcium: 9.1 mg/dL (ref 8.4–10.5)
Chloride: 101 mEq/L (ref 96–112)
Creatinine, Ser: 0.95 mg/dL (ref 0.4–1.2)
GFR calc Af Amer: >60 mL/min (ref >60)
Glucose, Bld: 100 mg/dL — ABNORMAL HIGH (ref 70–99)

## 2010-07-15 LAB — TSH: TSH: 7.039 u[IU]/mL — ABNORMAL HIGH (ref 0.350–4.500)

## 2010-07-15 LAB — HEMOGLOBIN A1C: Hgb A1c MFr Bld: 5.4 % (ref <5.7)

## 2010-07-15 LAB — TROPONIN I: Troponin I: <0.01 ng/mL (ref 0.00–0.06)

## 2010-07-16 DIAGNOSIS — I2510 Atherosclerotic heart disease of native coronary artery without angina pectoris: Secondary | ICD-10-CM

## 2010-07-16 LAB — CBC
MCH: 32.4 pg (ref 26.0–34.0)
MCV: 98.7 fL (ref 78.0–100.0)
Platelets: 231 10*3/uL (ref 150–400)
RBC: 3.86 MIL/uL — ABNORMAL LOW (ref 3.87–5.11)
RDW: 11.7 % (ref 11.5–15.5)
WBC: 7.6 10*3/uL (ref 4.0–10.5)

## 2010-07-16 LAB — PROTIME-INR: Prothrombin Time: 13.5 seconds (ref 11.6–15.2)

## 2010-07-16 LAB — LIPID PANEL
Cholesterol: 137 mg/dL (ref 0–200)
HDL: 26 mg/dL — ABNORMAL LOW (ref >39)
LDL Cholesterol: UNDETERMINED mg/dL (ref 0–99)
Total CHOL/HDL Ratio: 5.3 RATIO

## 2010-07-16 LAB — CARDIAC PANEL(CRET KIN+CKTOT+MB+TROPI)
CK, MB: 1.7 ng/mL (ref 0.3–4.0)
CK, MB: 1.4 ng/mL (ref 0.3–4.0)
Total CK: 104 U/L (ref 7–177)
Total CK: 103 U/L (ref 7–177)
Troponin I: <0.01 ng/mL (ref 0.00–0.06)

## 2010-07-16 NOTE — Miscellaneous (Signed)
Summary: lipitor application  application for Lipitor assistance program and prescription mailed to [employer], RN  May 13, 2010 1:11 PM   Clinical Lists Changes  Medications: Rx of LIPITOR 80 MG TABS (ATORVASTATIN CALCIUM) Take one tablet by mouth daily.;  #90 x 3;  Signed;  Entered by: Meredith Staggers, RN;  Authorized by: Dolores Patty, MD, [facility];  Method used: Printed then mailed to [facility] [address], [place], [place], [place]  [address], Ph: [phone], Fax: [phone]    Prescriptions: LIPITOR 80 MG TABS (ATORVASTATIN CALCIUM) Take one tablet by mouth daily.  #90 x 3   Entered by:   Meredith Staggers, RN   Authorized by:   Dolores Patty, MD, [facility]   Signed by:   Meredith Staggers, RN on 05/13/2010   Method used:   Printed then mailed to ...       [facility] (retail)       [address]       [place], [place]  [address]       Ph: [phone]       Fax: [phone]   RxID:   [id number]   Appended Document: lipitor application pt approved for Lipitor asst. program, Lipitor left for pt at front desk left mess on her ID VM

## 2010-07-16 NOTE — Assessment & Plan Note (Signed)
Summary: Pulmonary OV   Copy to:  Dr. Gala Romney Primary Provider/Referring Provider:  n/a  CC:  3 month follow up.  Pt states breathing is better since last OV but does c/o prod cough with greenish brown mucus and wheezing x 10 days.  Chest pressure on and off x 3 wks. Denis f/c/s.  No cigerettes x 4 months..  History of Present Illness: Pulmonary Consultation       55 yo WF with Golds Stage I COPD  Pt with Dx CAD.  Pt notes last heart cath 11/10  , the pt developed small blockages over 11months.  The pt still has recurring chest pain.  Cardiology feels is non cardiac.  UGI was recommended but pt wanted pulm eval first.  The pt also  notes dyspnea and is a long term smoker, quit for years and went back to smoking.  The pt has chest pain ongoing since first AMI 3/08.   The  pain is on and off.   The pain is in the epigastric area.  It is a pressure like,  pulling like sensation,  worse if up to mailbox and walking up incline.  The pt iIs out of shape and overweight. The pt gets sweats with walking short distances.  The pt   has to sit down and put up legs.  The pt Quit smoking for  20days then smoked again.   Pt has anxiety issues, lives in a home with smoker.  The pt then  went cold [place] one time and off one year The pt has had bronchitis 8times in past two years.  In the past year there has been  three episodes of bronchitis . The pt desires a PCP.    Mucus now is green and thick.  Pt referred for further eval.March 25, 2010 2:05 PM at last ov we rec: Start doxycycline one twice a day for 7 days Start Symbicort two puff twice daily Stop benzepril Start Benicar one daily Stop smoking, use nicotine patch or gum The pt is now off smoking!!!   down to three to five nicorette gum/day  Pt is better with less dyspnea and cough.  No excess mucus.  July 08, 2010 9:54 AM stopped smoking !!! used nicotine gum and this helped chest pains caused difficulty with nicotine copd f/u:  golds  stage I:  dyspnea is better,  did have a URI complaint.  mucus 10days ago.    mucus is dark green.  no mucus out of nose not alot of mucus.  notes somewheeze having chest pressure daily.  using NTG sl.    Preventive Screening-Counseling & Management  Alcohol-Tobacco     Smoking Status: quit < 6 months  Current Medications (verified): 1)  Benicar Hct 40-12.5 Mg  Tabs (Olmesartan Medoxomil-Hctz) .... One Tablet By Mouth Daily 2)  Metoprolol Tartrate 25 Mg Tabs (Metoprolol Tartrate) .... Every 3 Days 3)  Lipitor 80 Mg Tabs (Atorvastatin Calcium) .... Take One Tablet By Mouth Daily. 4)  Aspirin Ec 325 Mg Tbec (Aspirin) .... Take One Tablet By Mouth Daily 5)  Prilosec Otc 20 Mg Tbec (Omeprazole Magnesium) .... Take 1 Tablet By Mouth Once A Day 6)  Multivitamins   Tabs (Multiple Vitamin) .Marland Kitchen.. 1 Once Daily 7)  B Complex  Tabs (B Complex Vitamins) .... Take 1 Tablet By Mouth Once A Day 8)  Fish Oil 1000 Mg Caps (Omega-3 Fatty Acids) .... Three Times A Day 9)  Citalopram Hydrobromide 40 Mg Tabs (Citalopram Hydrobromide) .... Once  Daily 10)  Alprazolam 1 Mg Tabs (Alprazolam) .... Take 1 Tablet By Mouth Two Times A Day and 1/2 Tablet Two Times A Day As Needed 11)  Nitrostat 0.4 Mg Subl (Nitroglycerin) .... As Directed 12)  Plavix 75 Mg Tabs (Clopidogrel Bisulfate) .... Take One Tablet By Mouth Daily 13)  Gabapentin 400 Mg Caps (Gabapentin) .... At Bedtime 14)  Vicks Dayquil Cold & Flu 10-5-325 Mg/29ml Liqd (Dm-Phenylephrine-Acetaminophen) .... As Needed 15)  Vicks Nyquil Cold & Flu 15-6.25-325 Mg Caps (Dm-Doxylamine-Acetaminophen) .... As Needed 16)  Menthol Cough Drops 5 Mg Lozg (Throat Lozenges) .... As Needed 17)  Symbicort 160-4.5 Mcg/act  Aero (Budesonide-Formoterol Fumarate) .... 2 Puffs Once Daily 18)  Topiramate 50 Mg Tabs (Topiramate) .... Take 1 Tablet By Mouth Two Times A Day 19)  Loratadine 10 Mg Tabs (Loratadine) .... Take 1 Tablet By Mouth Once A Day  Allergies (verified): 1)  !  Imdur  Past History:  Past medical, surgical, family and social histories (including risk factors) reviewed, and no changes noted (except as noted below).  Past Medical History: Reviewed history from 07/23/2009 and no changes required. 1. CAD      --s/p BMS to RCA and LCX 2008.      --In-stent restenosis of RCA stent rx'd with Xience DES 12/09      --requires extensive sedation for cath      --last cath 11/10: LAD mild plaque. LCX 10% RCA 50% ISR (FFR 0.91/0.92) Distal RCA 40/50  2. Chronic CP  3. Anxiety/depression      --h/o suicide attempt/drug overdose 3/08  4. Hypertension.  5. Obesity.  6. Tobacco use.   7. History of alcohol abuse, now quit.  8. 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  9. Gastroesophageal reflux disease. Arthritis COPD Depression Hyperlipidemia Pneumonia Urinary Tract Infection colitis  Past Surgical History: Reviewed history from 01/21/2010 and no changes required. PTCA-Stent C- section x3 Carpal Tunnel Release colon bx 08/2008 tubal liagation 1982  Family History: Reviewed history from 01/21/2010 and no changes required. Mother h/o CAD s/p stent, emphysema Father h/o CHF,  died lung CA, clotting disorder, emphysema Family History of Colon Cancer:Father Family History of Prostate Cancer:father  Social History: Reviewed history from 01/21/2010 and no changes required. She is single.  She has three children.   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   She has history of tobacco one pack per day for many years.    Also has history of heavy alcohol abuse.  Now reportedly quit.  Denies drug use. Alcohol Use - yes (see notes) Patient is a current smoker. 1 ppd x 82yrs.  Currently down to 6-10 cig/day 3 children  Review of Systems       The patient complains of shortness  of breath with activity, productive cough, and chest pain.  The patient denies shortness of breath at rest, non-productive cough, coughing up blood, irregular heartbeats, acid heartburn, indigestion, loss of appetite, weight change, abdominal pain, difficulty swallowing, sore throat, tooth/dental problems, headaches, nasal congestion/difficulty breathing through nose, sneezing, itching, ear ache, anxiety, depression, hand/feet swelling, joint stiffness or pain, rash, change in color of mucus, and fever.    Vital Signs:  Patient profile:   55 year old female Height:      61.5 inches Weight:      203.13 pounds BMI:  37.90 O2 Sat:      100 % on Room air Temp:     97.5 degrees F oral Pulse rate:   64 / minute BP sitting:   100 / 62  (left arm) Cuff size:   regular  Vitals Entered By: Gweneth Dimitri RN (July 08, 2010 9:38 AM)  O2 Flow:  Room air CC: 3 month follow up.  Pt states breathing is better since last OV but does c/o prod cough with greenish brown mucus and wheezing x 10 days.  Chest pressure on and off x 3 wks. Denis f/c/s.  No cigerettes x 4 months. Comments Medications reviewed with patient Daytime contact number verified with patient. Gweneth Dimitri RN  July 08, 2010 9:41 AM    Physical Exam  General:  obese.   Nose:  clear nasal discharge.   Mouth:  no deformity or lesions Neck:  no masses, thyromegaly, or abnormal cervical nodes Chest Wall:  no deformities noted Lungs:  decreased BS bilateral.   Heart:  regular rate and rhythm, S1, S2 without murmurs, rubs, gallops, or clicks Abdomen:  bowel sounds positive; abdomen soft and non-tender without masses, or organomegaly Msk:  no deformity or scoliosis noted with normal posture Pulses:  pulses normal Extremities:  no clubbing, cyanosis, edema, or deformity noted Neurologic:  CN II-XII grossly intact with normal reflexes, coordination, muscle strength and tone  Skin:  intact without lesions or rashes Cervical Nodes:   no significant adenopathy Axillary Nodes:  no significant adenopathy Psych:  anxious, easily distracted, and poor concentration.     Impression & Recommendations:  Problem # 1:  ACUTE BRONCHITIS (ICD-466.0) Assessment Deteriorated acute bronchitis plan doxycycline x 7days increase symbicort to two puff two times a day   Her updated medication list for this problem includes:    Vicks Dayquil Cold & Flu 10-5-325 Mg/43ml Liqd (Dm-phenylephrine-acetaminophen) .Marland Kitchen... As needed    Vicks Nyquil Cold & Flu 15-6.25-325 Mg Caps (Dm-doxylamine-acetaminophen) .Marland Kitchen... As needed    Symbicort 160-4.5 Mcg/act Aero (Budesonide-formoterol fumarate) .Marland Kitchen... 2 puffs twice daily    Doxycycline Monohydrate 100 Mg Caps (Doxycycline monohydrate) ..... By mouth twice daily  Orders: Est. Patient Level IV ([address])  Problem # 2:  CHEST PAIN UNSPECIFIED (ICD-786.50) Assessment: Deteriorated ongoing chest pain and using NTG plan make sooner cardiology ov than 4/12  Medications Added to Medication List This Visit: 1)  Metoprolol Tartrate 25 Mg Tabs (Metoprolol tartrate) .... Every 3 days 2)  Alprazolam 1 Mg Tabs (Alprazolam) .... Take 1 tablet by mouth two times a day and 1/2 tablet two times a day as needed 3)  Vicks Dayquil Cold & Flu 10-5-325 Mg/71ml Liqd (Dm-phenylephrine-acetaminophen) .... As needed 4)  Vicks Nyquil Cold & Flu 15-6.25-325 Mg Caps (Dm-doxylamine-acetaminophen) .... As needed 5)  Menthol Cough Drops 5 Mg Lozg (Throat lozenges) .... As needed 6)  Symbicort 160-4.5 Mcg/act Aero (Budesonide-formoterol fumarate) .... 2 puffs once daily 7)  Symbicort 160-4.5 Mcg/act Aero (Budesonide-formoterol fumarate) .... 2 puffs twice daily 8)  Topiramate 50 Mg Tabs (Topiramate) .... Take 1 tablet by mouth two times a day 9)  Doxycycline Monohydrate 100 Mg Caps (Doxycycline monohydrate) .... By mouth twice daily  Complete Medication List: 1)  Benicar Hct 40-12.5 Mg Tabs (Olmesartan medoxomil-hctz) .... One  tablet by mouth daily 2)  Metoprolol Tartrate 25 Mg Tabs (Metoprolol tartrate) .... Every 3 days 3)  Lipitor 80 Mg Tabs (Atorvastatin calcium) .... Take one tablet by mouth daily. 4)  Aspirin Ec 325 Mg Tbec (Aspirin) .Marland KitchenMarland KitchenMarland Kitchen  Take one tablet by mouth daily 5)  Prilosec Otc 20 Mg Tbec (Omeprazole magnesium) .... Take 1 tablet by mouth once a day 6)  Multivitamins Tabs (Multiple vitamin) .Marland Kitchen.. 1 once daily 7)  B Complex Tabs (B complex vitamins) .... Take 1 tablet by mouth once a day 8)  Fish Oil 1000 Mg Caps (Omega-3 fatty acids) .... Three times a day 9)  Citalopram Hydrobromide 40 Mg Tabs (Citalopram hydrobromide) .... Once daily 10)  Alprazolam 1 Mg Tabs (Alprazolam) .... Take 1 tablet by mouth two times a day and 1/2 tablet two times a day as needed 11)  Nitrostat 0.4 Mg Subl (Nitroglycerin) .... As directed 12)  Plavix 75 Mg Tabs (Clopidogrel bisulfate) .... Take one tablet by mouth daily 13)  Gabapentin 400 Mg Caps (Gabapentin) .... At bedtime 14)  Vicks Dayquil Cold & Flu 10-5-325 Mg/64ml Liqd (Dm-phenylephrine-acetaminophen) .... As needed 15)  Vicks Nyquil Cold & Flu 15-6.25-325 Mg Caps (Dm-doxylamine-acetaminophen) .... As needed 16)  Menthol Cough Drops 5 Mg Lozg (Throat lozenges) .... As needed 17)  Symbicort 160-4.5 Mcg/act Aero (Budesonide-formoterol fumarate) .... 2 puffs twice daily 18)  Topiramate 50 Mg Tabs (Topiramate) .... Take 1 tablet by mouth two times a day 19)  Loratadine 10 Mg Tabs (Loratadine) .... Take 1 tablet by mouth once a day 20)  Doxycycline Monohydrate 100 Mg Caps (Doxycycline monohydrate) .... By mouth twice daily  Patient Instructions: 1)  doxycycline one twice daily for 6days 2)  increase to symbicort two puff two times a day  3)  see cardiology for your chest pain, metoprolol dosing soon 4)  Return 4 months  Prescriptions: SYMBICORT 160-4.5 MCG/ACT  AERO (BUDESONIDE-FORMOTEROL FUMARATE) 2 puffs twice daily  #1 x 6   Entered and Authorized by:   Storm Frisk MD   Signed by:   Storm Frisk MD on 07/08/2010   Method used:   Electronically to        [facility] (retail)       [address]       [place], [place]  [address]       Ph: [phone]       Fax: [phone]   RxID:   [id number] DOXYCYCLINE MONOHYDRATE 100 MG  CAPS (DOXYCYCLINE MONOHYDRATE) By mouth twice daily  #12 x 0   Entered and Authorized by:   Storm Frisk MD   Signed by:   Storm Frisk MD on 07/08/2010   Method used:   Electronically to        [facility] (retail)       [address]       [place], [place]  [address]       Ph: [phone]       Fax: [phone]   RxID:   [id number]

## 2010-07-17 DIAGNOSIS — R079 Chest pain, unspecified: Secondary | ICD-10-CM

## 2010-07-28 ENCOUNTER — Other Ambulatory Visit (INDEPENDENT_AMBULATORY_CARE_PROVIDER_SITE_OTHER): Payer: Medicare Other

## 2010-07-28 ENCOUNTER — Encounter: Payer: Self-pay | Admitting: Physician Assistant

## 2010-07-28 ENCOUNTER — Other Ambulatory Visit: Payer: Self-pay | Admitting: Physician Assistant

## 2010-07-28 DIAGNOSIS — E039 Hypothyroidism, unspecified: Secondary | ICD-10-CM

## 2010-07-28 LAB — T4, FREE: Free T4: 0.62 ng/dL (ref 0.60–1.60)

## 2010-07-29 ENCOUNTER — Telehealth: Payer: Self-pay | Admitting: Internal Medicine

## 2010-07-30 ENCOUNTER — Telehealth (INDEPENDENT_AMBULATORY_CARE_PROVIDER_SITE_OTHER): Payer: Self-pay | Admitting: *Deleted

## 2010-07-31 ENCOUNTER — Other Ambulatory Visit: Payer: Medicare Other

## 2010-07-31 ENCOUNTER — Encounter: Payer: Medicare Other | Admitting: Physician Assistant

## 2010-08-03 ENCOUNTER — Telehealth: Payer: Self-pay | Admitting: Internal Medicine

## 2010-08-03 ENCOUNTER — Encounter: Payer: Self-pay | Admitting: Physician Assistant

## 2010-08-03 ENCOUNTER — Encounter (INDEPENDENT_AMBULATORY_CARE_PROVIDER_SITE_OTHER): Payer: Medicare Other | Admitting: Physician Assistant

## 2010-08-03 DIAGNOSIS — I10 Essential (primary) hypertension: Secondary | ICD-10-CM

## 2010-08-03 DIAGNOSIS — K219 Gastro-esophageal reflux disease without esophagitis: Secondary | ICD-10-CM | POA: Insufficient documentation

## 2010-08-03 DIAGNOSIS — R079 Chest pain, unspecified: Secondary | ICD-10-CM

## 2010-08-03 DIAGNOSIS — I2510 Atherosclerotic heart disease of native coronary artery without angina pectoris: Secondary | ICD-10-CM

## 2010-08-05 ENCOUNTER — Other Ambulatory Visit: Payer: Self-pay | Admitting: Internal Medicine

## 2010-08-05 ENCOUNTER — Ambulatory Visit (INDEPENDENT_AMBULATORY_CARE_PROVIDER_SITE_OTHER): Payer: Medicare Other | Admitting: Internal Medicine

## 2010-08-05 ENCOUNTER — Encounter: Payer: Self-pay | Admitting: Internal Medicine

## 2010-08-05 DIAGNOSIS — R1013 Epigastric pain: Secondary | ICD-10-CM

## 2010-08-05 DIAGNOSIS — IMO0001 Reserved for inherently not codable concepts without codable children: Secondary | ICD-10-CM

## 2010-08-05 DIAGNOSIS — K219 Gastro-esophageal reflux disease without esophagitis: Secondary | ICD-10-CM

## 2010-08-05 NOTE — Progress Notes (Signed)
Summary: refill plavix   Phone Note Outgoing Call   Call placed by: Meredith Staggers, RN,  July 29, 2010 11:27 AM Summary of Call: pt dropped off note yest. needing her Plavix ordered from company, called to order they state its time for pt to reapply for program, called pt she states she now has [facility] advised will not qualify for program, rx sent into [facility]     Prescriptions: PLAVIX 75 MG TABS (CLOPIDOGREL BISULFATE) Take one tablet by mouth daily  #90 x 3   Entered by:   Meredith Staggers, RN   Authorized by:   Dolores Patty, MD, [facility]   Signed by:   Meredith Staggers, RN on 07/29/2010   Method used:   Electronically to        [facility] (retail)       [address]       [place], [place]  [address]       Ph: [phone]       Fax: [phone]   RxID:   [id number]

## 2010-08-05 NOTE — Progress Notes (Signed)
Summary: ?primary doctor  Phone Note Call from Patient Call back at Home Phone [phone] [facility]     Caller: Patient Call For: wright Reason for Call: Talk to Nurse Summary of Call: Patient is wanting to know who would Dr. Delford Field recommend as a primary doctor in our practice, prefers a woman?   Initial call taken by: Lehman Prom,  July 30, 2010 2:32 PM  Follow-up for Phone Call        Dr. Delford Field, pls advise.  Thanks! Gweneth Dimitri RN  July 30, 2010 2:47 PM   Additional Follow-up for Phone Call Additional follow up Details #1::        Rene Paci is excellent Additional Follow-up by: Storm Frisk MD,  July 30, 2010 2:57 PM    Additional Follow-up for Phone Call Additional follow up Details #2::    Called, spoke with pt.  She was informed of above rec per PW and verbalized understanding.    Pt states she was recently in the hospital on 07/15/10 for cardiac cath.  Was still having chest pains while in hospital and still having them but states she was told cardiac cath ok.  Pt states breathing is unchanged from OV with PW on 06/2010 and does not think she needs to f/u with PW before her 4 month follow up at this time.  I advised she should call cardiology md to discuss this and have their recs as last OV with PW on 07/08/10 stated to f/u with cards for chest pain.  Pt verbalized understanding of this. Follow-up by: Gweneth Dimitri RN,  July 30, 2010 3:24 PM

## 2010-08-06 ENCOUNTER — Telehealth: Payer: Self-pay | Admitting: Internal Medicine

## 2010-08-07 ENCOUNTER — Other Ambulatory Visit: Payer: Medicare Other

## 2010-08-07 ENCOUNTER — Ambulatory Visit: Payer: Medicare Other | Admitting: Critical Care Medicine

## 2010-08-11 NOTE — Letter (Signed)
Summary: EGD Instructions  [facility]  [address] [place], [place] [address]   Phone: [phone]  Fax: [phone]       Kara Lamb    [birth year]    MRN: [id number]       Procedure Day /Date: Tuesday 08/18/10     Arrival Time: 3:00 pm     Procedure Time: 4:00 pm     Location of Procedure:                    _x  _ [facility] (4th Floor)  PREPARATION FOR ENDOSCOPY   On 08/18/10 THE DAY OF THE PROCEDURE:  1.   No solid foods, milk or milk products are allowed after midnight the night before your procedure.  2.   Do not drink anything colored red or purple.  Avoid juices with pulp.  No orange juice.  3.  You may drink clear liquids until 2:00 pm, which is 2 hours before your procedure.                                                                                                CLEAR LIQUIDS INCLUDE: Water Jello Ice Popsicles Tea (sugar ok, no milk/cream) Powdered fruit flavored drinks Coffee (sugar ok, no milk/cream) Gatorade Juice: apple, white grape, white cranberry  Lemonade Clear bullion, consomm, broth Carbonated beverages (any kind) Strained chicken noodle soup Hard Candy   MEDICATION INSTRUCTIONS  Unless otherwise instructed, you should take regular prescription medications with a small sip of water as early as possible the morning of your procedure.   .Continue taking your Plavix for your procedure per Dr. Lina Sar.               OTHER INSTRUCTIONS  You will need a responsible adult at least 55 years of age to accompany you and drive you home.   This person must remain in the waiting room during your procedure.  Wear loose fitting clothing that is easily removed.  Leave jewelry and other valuables at home.  However, you may wish to bring a book to read or an iPod/MP3 player to listen to music as you wait for your procedure to start.  Remove all body piercing jewelry and leave at home.  Total time from  sign-in until discharge is approximately 2-3 hours.  You should go home directly after your procedure and rest.  You can resume normal activities the day after your procedure.  The day of your procedure you should not:   Drive   Make legal decisions   Operate machinery   Drink alcohol   Return to work  You will receive specific instructions about eating, activities and medications before you leave.    The above instructions have been reviewed and explained to me by   _______________________    I fully understand and can verbalize these instructions _____________________________ Date _________

## 2010-08-11 NOTE — Progress Notes (Signed)
Summary: Triage   Phone Note Call from Patient Call back at Home Phone [phone]   Caller: Patient Call For: Dr. Juanda Chance Reason for Call: Talk to Nurse Summary of Call: Pt had heart cath and is still having chest pains, cardiologist say chest pains are not cardiac related and that she should see GI, woudl like to discuss with nurse possible upper Gi Initial call taken by: [place] Johnson,  August 03, 2010 1:07 PM  Follow-up for Phone Call        I have left a message for the patient that she is scheduled to see Dr Juanda Chance for 08/05/10 2:15.  I have asked her to please call me back and confirm she can keep this appointment.  I did speak with Cardiology [facility] office she was seen there today, he says it is reasonable to see Dr Juanda Chance. Follow-up by: Darcey Nora RN, CGRN,  August 03, 2010 3:25 PM  Additional Follow-up for Phone Call Additional follow up Details #1::        patient called back to confirm she can come for the appointment on Wed at 2:15 Additional Follow-up by: Darcey Nora RN, CGRN,  August 03, 2010 3:36 PM

## 2010-08-11 NOTE — Progress Notes (Signed)
Summary: pt has questions re lab work   Phone Note Call from Patient   Caller: [phone] Reason for Call: Talk to Nurse Summary of Call: pt calling re question re lab work Initial call taken by: Glynda Jaeger,  August 06, 2010 11:37 AM  Follow-up for Phone Call        spoke w/pt all labs resch to 3/5 at [facility], RN  August 06, 2010 12:53 PM

## 2010-08-11 NOTE — Assessment & Plan Note (Signed)
Summary: post hosp  Medications Added ASPIRIN 81 MG TBEC (ASPIRIN) Take one tablet by mouth daily ALPRAZOLAM 1 MG TABS (ALPRAZOLAM) three times a day as needed BENICAR 20 MG TABS (OLMESARTAN MEDOXOMIL) 1/2 tab daily for 1 week then INCREASE to 1 tablet daily..      Allergies Added:   Visit Type:  Follow-up Referring Provider:  Dr. Gala Romney Primary Provider:  n/a  CC:  same pain on and off still since cath.  History of Present Illness: Primary Cardiologist:  Dr. Arvilla Meres  Kara Lamb is a 55 year old  woman with CAD and chronic chest pain. She's had previous stenting of her RCA and left circumflex complicated by in-stent restenosis of her RCA back in 2009. At that time. She underwent drug-eluting stent to her RCA.  She also has a h/o HTN, HL, obesity, reflex sympathetic dystrophy and suicidal depression.  Last myoview was in 12/2009 and demonstrated an of EF 74% with no ischemia or infarct.  She was admitted 2/1 with chest pain.  MI was r/o.  She went for cath 07/16/10 and this demonstrated LAD 20-30%; CFX 20-30%, mCFX stent with 20-30% ISR; mRCA stent with 30-40% ISR, dRCA 30-40%; EF 55-60%.  Her TSH was elevated but repeat since discharge is normal.  Lipid panel in the hospital was abnormal with TC 137, TG 543, HDL 26.  She is to have a repeat FLP.  Her ARB, diuretic and beta blocker were all held due to low BP.  She returns for follow up.  She brings in a list of her BPs for my review.  They have steadily increased over the last couple of weeks.  She had some difficulty with wrist pain after the cath.  She has RSD and bilateral rotator cuff tendinopathy.  She had a sudden increase in intensity of pain while pouring some milk and developed what sounds like a bruise.  It did improve.  She requests that she never have a wrist case again.  She continues to have chest pressure.  It sounds fairly constant.  It does get worse with exertion.  She has had improvement with NTG in the past.  No  orthopnea, pnd or edema.  No syncope.  She denies any significant dyspnea.  She has an appt with her pulmonologist and her gastroenterologist to further evaluate her chest pain.    Current Medications (verified): 1)  Lipitor 80 Mg Tabs (Atorvastatin Calcium) .... Take One Tablet By Mouth Daily. 2)  Aspirin 81 Mg Tbec (Aspirin) .... Take One Tablet By Mouth Daily 3)  Prilosec Otc 20 Mg Tbec (Omeprazole Magnesium) .... Take 1 Tablet By Mouth Once A Day 4)  Multivitamins   Tabs (Multiple Vitamin) .Marland Kitchen.. 1 Once Daily 5)  B Complex  Tabs (B Complex Vitamins) .... Take 1 Tablet By Mouth Once A Day 6)  Fish Oil 1000 Mg Caps (Omega-3 Fatty Acids) .... Three Times A Day 7)  Citalopram Hydrobromide 40 Mg Tabs (Citalopram Hydrobromide) .... Once Daily 8)  Alprazolam 1 Mg Tabs (Alprazolam) .... Three Times A Day As Needed 9)  Nitrostat 0.4 Mg Subl (Nitroglycerin) .... As Directed 10)  Plavix 75 Mg Tabs (Clopidogrel Bisulfate) .... Take One Tablet By Mouth Daily 11)  Gabapentin 400 Mg Caps (Gabapentin) .... At Bedtime 12)  Vicks Dayquil Cold & Flu 10-5-325 Mg/6ml Liqd (Dm-Phenylephrine-Acetaminophen) .... As Needed 13)  Vicks Nyquil Cold & Flu 15-6.25-325 Mg Caps (Dm-Doxylamine-Acetaminophen) .... As Needed 14)  Menthol Cough Drops 5 Mg Lozg (Throat Lozenges) .... As Needed  15)  Symbicort 160-4.5 Mcg/act  Aero (Budesonide-Formoterol Fumarate) .... 2 Puffs Twice Daily 16)  Topiramate 50 Mg Tabs (Topiramate) .... Take 1 Tablet By Mouth Two Times A Day 17)  Loratadine 10 Mg Tabs (Loratadine) .... Take 1 Tablet By Mouth Once A Day  Allergies (verified): 1)  ! Imdur  Past History:  Past Medical History: 1. CAD      --s/p BMS to RCA and LCX 2008.      --In-stent restenosis of RCA stent rx'd with Xience DES 12/09      --requires extensive sedation for cath      --cath 11/10: LAD mild plaque. LCX 10% RCA 50% ISR (FFR 0.91/0.92) Distal RCA 40/50      --cath 2/12: LAD 20-30; CFX 20-30, stent with 20-30; RCA  stent with 30-40 and dRCA 30-40; EF 55-60%  2. Chronic CP  3. Anxiety/depression      --h/o suicide attempt/drug overdose 3/08  4. Hypertension.  5. Obesity.  6. Tobacco use.   7. History of alcohol abuse, now quit.  8. 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  9. Gastroesophageal reflux disease. Arthritis COPD Depression Hyperlipidemia Pneumonia Urinary Tract Infection colitis  Review of Systems       As per  the HPI.  All other systems reviewed and negative.   Vital Signs:  Patient profile:   55 year old female Height:      61.5 inches Weight:      197 pounds BMI:     36.75 Pulse rate:   62 / minute BP sitting:   166 / 88  (left arm) Cuff size:   regular  Vitals Entered By: [name], RMA (August 03, 2010 2:22 PM)  Physical Exam  General:  Well nourished, well developed, in no acute distress HEENT: normal Neck: no JVD Cardiac:  normal S1, S2; RRR; no murmur Lungs:  clear to auscultation bilaterally, no wheezing, rhonchi or rales Abd: soft, nontender, no hepatomegaly Ext: no  edema; right wrist without hematoma or bruit Skin: warm and dry Neuro:  CNs 2-12 intact, no focal abnormalities noted    EKG  Procedure date:  08/03/2010  Findings:      sinus rhythm Heart rate 62 Normal axis Nonspecific ST-T wave changes  Impression & Recommendations:  Problem # 1:  CHEST PAIN UNSPECIFIED (ICD-786.50) I reviewed the findings on her cardiac catheterization with her today.  I tried to reassure her.  We spent some time comparing her prior cardiac catheterization to the one that she had recently.  I suggested that she try amlodipine for her blood pressure to cover for the possibility of coronary vasospasm or possible microvascular angina.  She preferred to decline at this time.  She has arranged followups with gastroenterology as well as pulmonology.  She would  prefer to pursue further workup of her chest pain with them first.  She can followup with Dr. Gala Romney in the next couple of months.  Orders: EKG w/ Interpretation (93000)  Problem # 2:  COPD (ICD-496) F/u with Dr. Delford Field.  Problem # 3:  GERD (ICD-530.81) F/u with Dr. Juanda Chance.  Problem # 4:  CAD, NATIVE VESSEL (ICD-414.01) As above.  Continue ASA and Plavix. As noted, she does not want a wrist case in the future.  Problem # 5:  MIXED HYPERLIPIDEMIA (ICD-272.2) She had significantly elevated triglycerides in the hospital.  We will arrange for followup fasting lipid  panel at her convenience.  Problem # 6:  HYPERTENSION, BENIGN (ICD-401.1) She had her Benicar HCT and her metoprolol discontinued when she was in the hospital due to hypotension.  Her blood pressures have consistently gone up since discharge from the hospital.  She needs further treatment for her hypertension.  As noted above, I suggested amlodipine.  She deferred this for now.  Therefore, I have suggested that she restart Benicar at 20 mg, take a half a tablet a day for one week then increase to one whole tablet a day.  She will need a basic metabolic panel in followup in 2 weeks.  Patient Instructions: 1)  Your physician has recommended you make the following change in your medication:  START BENICAR 20 MG 1/2 TABLET DAILY FOR 1 WEEK THEN INCREASE TO 1 WHOLE TABLET DAILY. 2)  Your physician recommends that you schedule a follow-up appointment in: 2 MONTHS WITH DR. Gala Romney. 3)  Your physician recommends that you return for a FASTING lipid profile: 08/07/10 @ [facility].  4)  You have been SCHEDULED TO COME BACK TO [facility] FOR REPEAT BMET 08/17/10... Prescriptions: BENICAR 20 MG TABS (OLMESARTAN MEDOXOMIL) 1/2 tab daily for 1 week then INCREASE to 1 tablet daily..  #45 x 11   Entered by:   Danielle Rankin, CMA   Authorized by:   Dolores Patty, MD, [facility]   Signed by:   Danielle Rankin, CMA on 08/03/2010   Method used:    Electronically to        [facility] (retail)       [address]       [place], [place]  [address]       Ph: [phone]       Fax: [phone]   RxID:   [id number]  I have personally reviewed the prescriptions today for accuracy. Tereso Newcomer PA-C  August 03, 2010 4:42 PM

## 2010-08-11 NOTE — Assessment & Plan Note (Addendum)
Summary: Chest Pain/ Sheri    History of Present Illness Visit Type: Follow-up Visit Primary GI MD: Lina Sar MD Primary Provider: n/a Requesting Provider: Sanda Linger, MD Chief Complaint: chest pain non-cardiac  History of Present Illness:   This is a 55 year old white female with noncardiac chest pain and abdominal pain. We have seen her in the past for  lymphocytic colitis diagnosed on a colonoscopy in March 2010. She was never treated with Entocort or prednisone because she did not have insurance at that time. She still continues to have diarrhea. She sees [place] today because of subxiphoid and epigastric discomfort which occurs intermittently and has been somewhat improved on Prilosec 20 mg daily. She denies any dysphagia. She has known coronary artery disease and has a history of bipolar disorder treated with Celexa, Topamax, gabapentin and Xanax. Her appetite has been decreased but her weight has been stable. She feels dizzy, has headaches and some joint pains.   GI Review of Systems    Reports acid reflux, chest pain, and  nausea.      Denies abdominal pain, belching, bloating, dysphagia with liquids, dysphagia with solids, heartburn, loss of appetite, vomiting, vomiting blood, weight loss, and  weight gain.      Reports diarrhea.     Denies anal fissure, black tarry stools, change in bowel habit, constipation, diverticulosis, fecal incontinence, heme positive stool, hemorrhoids, irritable bowel syndrome, jaundice, light color stool, liver problems, rectal bleeding, and  rectal pain.    Current Medications (verified): 1)  Lipitor 80 Mg Tabs (Atorvastatin Calcium) .... Take One Tablet By Mouth Daily. 2)  Aspirin 81 Mg Tbec (Aspirin) .... Take One Tablet By Mouth Daily 3)  Prilosec Otc 20 Mg Tbec (Omeprazole Magnesium) .... Take 1 Tablet By Mouth Once A Day 4)  Multivitamins   Tabs (Multiple Vitamin) .Marland Kitchen.. 1 Once Daily 5)  B Complex  Tabs (B Complex Vitamins) .... Take 1 Tablet By  Mouth Once A Day 6)  Fish Oil 1000 Mg Caps (Omega-3 Fatty Acids) .... Three Times A Day 7)  Citalopram Hydrobromide 40 Mg Tabs (Citalopram Hydrobromide) .... Once Daily 8)  Alprazolam 1 Mg Tabs (Alprazolam) .... 2-3 Tablets A Day As Needed 9)  Nitrostat 0.4 Mg Subl (Nitroglycerin) .... As Directed 10)  Plavix 75 Mg Tabs (Clopidogrel Bisulfate) .... Take One Tablet By Mouth Daily 11)  Gabapentin 400 Mg Caps (Gabapentin) .... At Bedtime 12)  Vicks Dayquil Cold & Flu 10-5-325 Mg/8ml Liqd (Dm-Phenylephrine-Acetaminophen) .... As Needed 13)  Vicks Nyquil Cold & Flu 15-6.25-325 Mg Caps (Dm-Doxylamine-Acetaminophen) .... As Needed 14)  Menthol Cough Drops 5 Mg Lozg (Throat Lozenges) .... As Needed 15)  Symbicort 160-4.5 Mcg/act  Aero (Budesonide-Formoterol Fumarate) .... 2 Puffs Twice Daily 16)  Topiramate 50 Mg Tabs (Topiramate) .... Take 1 Tablet By Mouth Two Times A Day 17)  Loratadine 10 Mg Tabs (Loratadine) .... Take 1 Tablet By Mouth Once A Day 18)  Benicar 20 Mg Tabs (Olmesartan Medoxomil) .... 1/2 Tab Daily For 1 Week Then Increase To 1 Tablet Daily..  Allergies (verified): 1)  ! Imdur  Past History:  Past Medical History: Reviewed history from 08/03/2010 and no changes required. 1. CAD      --s/p BMS to RCA and LCX 2008.      --In-stent restenosis of RCA stent rx'd with Xience DES 12/09      --requires extensive sedation for cath      --cath 11/10: LAD mild plaque. LCX 10% RCA 50% ISR (FFR 0.91/0.92) Distal  RCA 40/50      --cath 2/12: LAD 20-30; CFX 20-30, stent with 20-30; RCA stent with 30-40 and dRCA 30-40; EF 55-60%  2. Chronic CP  3. Anxiety/depression      --h/o suicide attempt/drug overdose 3/08  4. Hypertension.  5. Obesity.  6. Tobacco use.   7. History of alcohol abuse, now quit.  8. 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  9. Gastroesophageal reflux  disease. Arthritis COPD Depression Hyperlipidemia Pneumonia Urinary Tract Infection colitis  Past Surgical History: Reviewed history from 01/21/2010 and no changes required. PTCA-Stent C- section x3 Carpal Tunnel Release colon bx 08/2008 tubal liagation 1982  Family History: Reviewed history from 01/21/2010 and no changes required. Mother h/o CAD s/p stent, emphysema Father h/o CHF,  died lung CA, clotting disorder, emphysema Family History of Colon Cancer:Father Family History of Prostate Cancer:father  Social History: Reviewed history from 01/21/2010 and no changes required. She is single.  She has three children.   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   She has history of tobacco one pack per day for many years.    Also has history of heavy alcohol abuse.  Now reportedly quit.  Denies drug use. Alcohol Use - yes (see notes) Patient is a current smoker. 1 ppd x 19yrs.  Currently down to 6-10 cig/day 3 children  Review of Systems       The patient complains of swelling of feet/legs.  The patient denies allergy/sinus, anemia, anxiety-new, arthritis/joint pain, back pain, blood in urine, breast changes/lumps, change in vision, confusion, cough, coughing up blood, depression-new, fainting, fatigue, fever, headaches-new, hearing problems, heart murmur, heart rhythm changes, itching, menstrual pain, muscle pains/cramps, night sweats, nosebleeds, pregnancy symptoms, shortness of breath, skin rash, sleeping problems, sore throat, swollen lymph glands, thirst - excessive , urination - excessive , urination changes/pain, urine leakage, vision changes, and voice change.         Pertinent positive and negative review of systems were noted in the above HPI. All other ROS was otherwise negative.   Vital Signs:  Patient profile:   55 year old female Height:      61.5 inches Weight:      196.13 pounds BMI:      36.59 Pulse rate:   76 / minute Pulse rhythm:   regular BP sitting:   130 / 80  (left arm) Cuff size:   regular  Vitals Entered By: June McMurray CMA Duncan Dull) (August 05, 2010 2:25 PM)  Physical Exam  General:  Well developed, well nourished, no acute distress. Eyes:  PERRLA, no icterus. Mouth:  No deformity or lesions, dentition normal. Neck:  Supple; no masses or thyromegaly. Chest Wall:  no tenderness in costochondral junctions Lungs:  Clear throughout to auscultation. Heart:  Regular rate and rhythm; no murmurs, rubs,  or bruits. Abdomen:  soft abdomen with normal active bowel sounds. I could not reproduce any abdominal pain. Liver edge at costal margin. No distention. Extremities:  No clubbing, cyanosis, edema or deformities noted. Skin:  Intact without significant lesions or rashes. Psych:  Alert and cooperative. Normal mood and affect.   Impression & Recommendations:  Problem # 1:  CHEST PAIN UNSPECIFIED (ICD-786.50) Patient has noncardiac chest pain in the setting of coronary artery disease. Some of her symptoms are consistent with gastroesophageal reflux or with hiatal hernia. We  will proceed with an upper endoscopy and upper abdominal ultrasound to evaluate her discomfort. I have asked her to continue Prilosec 20 mg a day until we can determine if she needs it.  Problem # 2:  PERSONAL HX COLONIC POLYPS (ICD-V12.72) Patient has a history of colon polyps seen on colonoscopy in [place] more than 10 years ago. Her last colonoscopy in March 2010 showed lymphocytic colitis. She has a positive family history of colon cancer in her father at age 50. She will be due for colonoscopy in March 2013.  Other Orders: EGD (EGD) Ultrasound Abdomen (UAS)  Patient Instructions: 1)  Please continue Prilosec 20 mg 1 tablet by mouth once daily. 2)  You have been cheduled for an upper endoscopy and biopsies while continuing Plavix. 3)  You have been scheduled for an upper abdominal  ultrasound to rule out symptomatic gallbladder disease. 4)  You will be due for a recall colonoscopy around March 2013. 5)  Copy sent to : Tereso Newcomer, PA 6)  The medication list was reviewed and reconciled.  All changed / newly prescribed medications were explained.  A complete medication list was provided to the patient / caregiver.

## 2010-08-14 ENCOUNTER — Other Ambulatory Visit (HOSPITAL_COMMUNITY): Payer: Medicare Other

## 2010-08-17 ENCOUNTER — Other Ambulatory Visit: Payer: Medicare Other

## 2010-08-17 ENCOUNTER — Ambulatory Visit (HOSPITAL_COMMUNITY)
Admission: RE | Admit: 2010-08-17 | Discharge: 2010-08-17 | Disposition: A | Discharge status: Home / Self Care | Payer: Medicare Other | Source: Ambulatory Visit | Attending: Internal Medicine | Admitting: Internal Medicine

## 2010-08-17 ENCOUNTER — Encounter (INDEPENDENT_AMBULATORY_CARE_PROVIDER_SITE_OTHER): Payer: Self-pay | Admitting: *Deleted

## 2010-08-17 ENCOUNTER — Other Ambulatory Visit: Payer: Self-pay | Admitting: Internal Medicine

## 2010-08-17 ENCOUNTER — Telehealth: Payer: Self-pay | Admitting: Internal Medicine

## 2010-08-17 DIAGNOSIS — R109 Unspecified abdominal pain: Secondary | ICD-10-CM | POA: Insufficient documentation

## 2010-08-17 DIAGNOSIS — I2510 Atherosclerotic heart disease of native coronary artery without angina pectoris: Secondary | ICD-10-CM

## 2010-08-17 DIAGNOSIS — E782 Mixed hyperlipidemia: Secondary | ICD-10-CM

## 2010-08-17 DIAGNOSIS — IMO0001 Reserved for inherently not codable concepts without codable children: Secondary | ICD-10-CM

## 2010-08-17 LAB — BASIC METABOLIC PANEL
CO2: 27 mEq/L (ref 19–32)
Chloride: 103 mEq/L (ref 96–112)
Glucose, Bld: 92 mg/dL (ref 70–99)
Sodium: 138 mEq/L (ref 135–145)

## 2010-08-17 LAB — HEPATIC FUNCTION PANEL
ALT: 27 U/L (ref 0–35)
AST: 27 U/L (ref 0–37)
Albumin: 4.4 g/dL (ref 3.5–5.2)
Alkaline Phosphatase: 70 U/L (ref 39–117)
Bilirubin, Direct: 0.1 mg/dL (ref 0.0–0.3)
Total Protein: 7.2 g/dL (ref 6.0–8.3)

## 2010-08-17 LAB — LIPID PANEL: Total CHOL/HDL Ratio: 4

## 2010-08-18 ENCOUNTER — Encounter: Payer: Self-pay | Admitting: Internal Medicine

## 2010-08-18 ENCOUNTER — Other Ambulatory Visit: Payer: Self-pay | Admitting: Internal Medicine

## 2010-08-18 ENCOUNTER — Other Ambulatory Visit (AMBULATORY_SURGERY_CENTER): Payer: Medicare Other | Admitting: Internal Medicine

## 2010-08-18 ENCOUNTER — Other Ambulatory Visit: Payer: Medicare Other

## 2010-08-18 ENCOUNTER — Encounter (INDEPENDENT_AMBULATORY_CARE_PROVIDER_SITE_OTHER): Payer: Self-pay | Admitting: *Deleted

## 2010-08-18 DIAGNOSIS — R079 Chest pain, unspecified: Secondary | ICD-10-CM

## 2010-08-18 DIAGNOSIS — K2270 Barrett's esophagus without dysplasia: Secondary | ICD-10-CM

## 2010-08-18 DIAGNOSIS — R351 Nocturia: Secondary | ICD-10-CM

## 2010-08-18 DIAGNOSIS — R933 Abnormal findings on diagnostic imaging of other parts of digestive tract: Secondary | ICD-10-CM

## 2010-08-18 DIAGNOSIS — K2940 Chronic atrophic gastritis without bleeding: Secondary | ICD-10-CM

## 2010-08-18 DIAGNOSIS — R109 Unspecified abdominal pain: Secondary | ICD-10-CM

## 2010-08-18 LAB — URINALYSIS
Bilirubin Urine: NEGATIVE
Leukocytes, UA: NEGATIVE
Nitrite: NEGATIVE
Specific Gravity, Urine: <=1.005 (ref 1.000–1.030)
pH: 5.5 (ref 5.0–8.0)

## 2010-08-19 ENCOUNTER — Encounter: Payer: Self-pay | Admitting: Internal Medicine

## 2010-08-19 ENCOUNTER — Telehealth: Payer: Self-pay | Admitting: Physician Assistant

## 2010-08-20 ENCOUNTER — Telehealth: Payer: Self-pay | Admitting: Physician Assistant

## 2010-08-20 NOTE — Discharge Summary (Signed)
Kara Lamb                  ACCOUNT NO.:  [id number]  MEDICAL RECORD NO.:  [id number]           PATIENT TYPE:  I  LOCATION:  6525                         FACILITY:  [facility]  PHYSICIAN:  Kara Fells. Excell Seltzer, MD  DATE OF BIRTH:  Dec 31, 1955  DATE OF ADMISSION:  07/15/2010 DATE OF DISCHARGE:  07/17/2010                              DISCHARGE SUMMARY   PRIMARY CARDIOLOGIST:  Bevelyn Buckles. Bensimhon, MD  PULMONOLOGIST:  Charlcie Cradle. Delford Field, MD, [facility]  DISCHARGE DIAGNOSIS:  Noncardiac chest pain.  SECONDARY DIAGNOSES: 1. Coronary artery disease status post prior bare-metal stenting of     the left circumflex and right coronary artery in 2008, and     subsequent drug-eluting stent placement in the right coronary     artery in 2009. 2. Remote tobacco abuse. 3. Remote EtOH abuse. 4. Hyperlipidemia. 5. Obesity. 6. Chronic obstructive pulmonary disease. 7. Osteoarthritis. 8. Gastroesophageal reflux disease. 9. History of chronic chest pain. 10.History of migraines. 11.Depression, anxiety with prior history of suicidal ideation and     attempts. 12.History of carpal tunnel syndrome. 13.Status post cesarean section. 14.Status post bilateral tubal ligation. 15.Status post carpal tunnel release.  ALLERGIES:  IMDUR.  PROCEDURES:  Left heart cardiac catheterization revealing widely patent left circumflex and right coronary artery stents with minimal nonobstructive disease.  EF was 55-60%.  HISTORY OF PRESENT ILLNESS:  [name] with prior history of CAD status post bare-metal stenting of left circumflex and RCA in 2008 with subsequent in-stent restenosis in the right coronary artery treated with drug-eluting stent in 2009.  The patient also has a history of recurrent chest pain with catheterization in 2010 revealing nonobstructive disease and subsequently, a negative Myoview in July 2011.  The patient presented to the [facility] ED on July 15, 2010, with a several-week  history of intermittent chest discomfort with rest and with exertion, sometime worse with position changes or deep inspiration.  Symptoms also improved by Xanax.  In the ED, she was treated with morphine with some relief.  ECG was nonacute and cardiac markers were negative.  She is admitted for further evaluation.  HOSPITAL COURSE:  The patient ruled out for MI.  She continued to have atypical chest discomfort and given her cardiac history, decision was made to pursue catheterization to define her anatomy.  She underwent diagnostic catheterization on July 16, 2010, revealing patent stents with nonobstructive disease throughout.  EF was 55-60%.  She was provided reassurance, and medical therapy has been continued.  Of note, the patient has been noted to have low blood pressures and for that reason, her ARB and diuretic were held and we are also going to hold her beta-blocker, which she was taking sparingly at home anyway.  We will continue her statin therapy, and the patient will be discharged home today in good condition.  DISCHARGE LABORATORY DATA:  Hemoglobin 12.5, hematocrit 38.1, WBC 76, platelets 231.  Sodium 138, potassium 4.0, chloride 101, CO2 of 27, BUN 15,  creatinine 0.95, glucose 100, calcium 9.1, amylase 32, hemoglobin A1c 5.4, lipase 30, CK 103, MB 1.4, troponin I less than 0.01,  total cholesterol 137, triglycerides 543, HDL 26, TSH 7.039.  DISPOSITION:  The patient will be discharged home today in good condition.  FOLLOWUP PLANS AND APPOINTMENTS:  We will arrange followup Dr. Gala Romney or with Tereso Newcomer, PA in approximately 2 weeks.  The patient will require followup lipid panel in about 1 month and also repeat thyroid function testing in the outpatient setting given slightly elevated TSH.  DISCHARGE MEDICATIONS: 1. Aspirin 81 mg daily. 2. Nitroglycerin 0.4 mg sublingual p.r.n. chest pain. 3. Alprazolam 1 mg t.i.d. p.r.n. 4. Celexa 40 mg daily. 5. Cough drops  at bedtime p.r.n. 6. Fish oil 1000 mg t.i.d. 7. Gabapentin 400 mg at bedtime. 8. Loratadine 10 mg daily. 9. Lipitor 80 mg at bedtime. 10.Multivitamin 1 daily. 11.Plavix 75 mg at bedtime. 12.Prilosec 20 mg daily. 13.Symbicort 160/4.5 mcg 2 puffs b.i.d. 14.Topamax 50 mg b.i.d. 15.Vitamin B complex with C 1 tablet daily.  OUTSTANDING LABORATORY STUDIES:  Followup triglycerides in approximately 1 month and also followup thyroid function testing in the outpatient setting with slightly elevated TSH while here.  DURATION OF DISCHARGE ENCOUNTER:  45 minutes including physician time.     Kara Lamb, ANP   ______________________________ Kara Fells. Excell Seltzer, MD    CB/MEDQ  D:  07/17/2010  T:  07/18/2010  Job:  [id number]  Electronically Signed by Kara Lamb ANP on 07/27/2010 12:03:41 PM Electronically Signed by Tonny Bollman MD on 08/18/2010 08:54:05 PM

## 2010-08-20 NOTE — Procedures (Signed)
[name]                  ACCOUNT NO.:  [id number]  MEDICAL RECORD NO.:  [id number]           PATIENT TYPE:  LOCATION:                                 FACILITY:  PHYSICIAN:  Veverly Fells. Excell Seltzer, MD  DATE OF BIRTH:  1956-02-22  DATE OF PROCEDURE: DATE OF DISCHARGE:                           CARDIAC CATHETERIZATION   PROCEDURES: 1. Left heart catheterization. 2. Selective coronary angiography. 3. Left ventricular angiography.  PROCEDURAL INDICATIONS:  [name] is a 55 year old woman who presented with chest pain.  She has a past cardiac history and has been treated with multiple PCI procedures.  She has stents in both right coronary artery and left circumflex.  Because of ongoing chest pain, she was referred for cardiac cath.  Risks and indication of procedure were reviewed with the patient. Informed consent was obtained.  The right wrist was prepped, draped, and anesthetized with 1% lidocaine.  Using modified Seldinger technique, a 5- French sheath was placed in the right radial artery.  Verapamil 3 mg was administered through the sheath.  Four thousand units of unfractionated heparin was given intravenously.  A TIG catheter was used for coronary imaging.  A pigtail catheter was used for left ventriculography. Catheter exchanges were performed over a safety J-wire.  The patient tolerated the procedure well.  There were no immediate complications.  PROCEDURAL FINDINGS:  Left ventricular pressure 129/17.  Aortic pressure 122/67 with a mean of 91.  Left ventriculography shows normal LV function.  The ejection fraction is estimated at 55-60%.  Right coronary artery:  The RCA is dominant.  It is patent throughout its course.  There is a long stented segment in the mid RCA with mild in- stent restenosis with about 30-40% luminal narrowing.  The distal RCA has diffuse irregularity, also with 30-40% stenosis.  The PDA is widely patent.  There is a small posterolateral  branch that is also patent.  Left mainstem is very short.  The left main has no significant stenosis. It divides into the LAD and left circumflex.  LAD:  The LAD is a moderate-caliber vessel that reaches the left ventricular apex.  There are scattered irregularities of up to 20-30% stenosis.  There is a modest-sized first diagonal branch without significant stenosis.  Left circumflex:  The left circumflex is patent throughout its course. There are luminal irregularities in the proximal vessel with 20-30% stenosis.  There is a widely patent stent in the mid circumflex, also with 20% in-stent restenosis.  There are 2 OM branches, both are widely patent.  FINAL ASSESSMENT: 1. Patent right coronary artery and left circumflex stents with minor     diffuse in-stent restenosis. 2. Mild diffuse nonobstructive coronary artery disease as described. 3. Normal left ventricular systolic function.  Suspect noncardiac chest pain.  The patient should continue with her current medical therapy for CAD.     Veverly Fells. Excell Seltzer, MD     MDC/MEDQ  D:  07/16/2010  T:  07/17/2010  Job:  [id number]  cc:   Bevelyn Buckles. Bensimhon, MD Rollene Rotunda, MD, [facility] Charlcie Cradle. Delford Field, MD, [facility]  Electronically Signed by  Tonny Bollman MD on 08/18/2010 08:54:08 PM

## 2010-08-24 ENCOUNTER — Encounter: Payer: Self-pay | Admitting: Internal Medicine

## 2010-08-25 ENCOUNTER — Telehealth: Payer: Self-pay | Admitting: Internal Medicine

## 2010-08-25 NOTE — Letter (Signed)
Summary: Custom - Lipid  [facility], Main Office  [address]   [place], [place] [address]   Phone: [phone]  Fax: [phone]     August 19, 2010 MRN: [id number]   Kara Lamb [address] [place], [place]  [address]   Dear [name],  We have reviewed your cholesterol results.  They are as follows:     Total Cholesterol:    124 (Desirable: less than 200)       HDL  Cholesterol:     31.80  (Desirable: greater than 40 for men and 50 for women)       LDL Cholesterol:       66  (Desirable: less than 100 for low risk and less than 70 for moderate to high risk)       Triglycerides:       130.0  (Desirable: less than 150)  Our recommendations include:  Looks Good, continue current meds.   Call our office at the number listed above if you have any questions.  Lowering your LDL cholesterol is important, but it is only one of a large number of "risk factors" that may indicate that you are at risk for heart disease, stroke or other complications of hardening of the arteries.  Other risk factors include:   A.  Cigarette Smoking* B.  High Blood Pressure* C.  Obesity* D.   Low HDL Cholesterol (see yours above)* E.   Diabetes Mellitus (higher risk if your is uncontrolled) F.  Family history of premature heart disease G.  Previous history of stroke or cardiovascular disease    *These are risk factors YOU HAVE CONTROL OVER.  For more information, visit .  There is now evidence that lowering the TOTAL CHOLESTEROL AND LDL CHOLESTEROL can reduce the risk of heart disease.  The American Heart Association recommends the following guidelines for the treatment of elevated cholesterol:  1.  If there is now current heart disease and less than two risk factors, TOTAL CHOLESTEROL should be less than 200 and LDL CHOLESTEROL should be less than 100. 2.  If there is current heart disease or two or more risk factors, TOTAL CHOLESTEROL should be less than 200 and LDL  CHOLESTEROL should be less than 70.  A diet low in cholesterol, saturated fat, and calories is the cornerstone of treatment for elevated cholesterol.  Cessation of smoking and exercise are also important in the management of elevated cholesterol and preventing vascular disease.  Studies have shown that 30 to 60 minutes of physical activity most days can help lower blood pressure, lower cholesterol, and keep your weight at a healthy level.  Drug therapy is used when cholesterol levels do not respond to therapeutic lifestyle changes (smoking cessation, diet, and exercise) and remains unacceptably high.  If medication is started, it is important to have you levels checked periodically to evaluate the need for further treatment options.  Thank you,    [employer] Team

## 2010-08-25 NOTE — Progress Notes (Signed)
Summary: Medication list  Medication list   Imported By: Kassie Mends 08/20/2010 10:05:31  _____________________________________________________________________  External Attachment:    Type:   Image     Comment:   External Document

## 2010-08-25 NOTE — Progress Notes (Signed)
Summary: Triage   Phone Note Call from Patient Call back at Home Phone [phone]   Caller: Patient Call For: Dr. Juanda Chance Reason for Call: Talk to Nurse Summary of Call: Has an appt. tomorrow and Thinks she has UTI and wants to know if she can get a urine test since she has an appt. Initial call taken by: Karna Christmas,  August 17, 2010 3:42 PM  Follow-up for Phone Call        Patient is calling to report frequent, small amounts of urinating that is now burning enough to bring "tears". She states she does not have a PCP yet. She is scheduled to see PCP to establish on 08/27/10. She has an appointment with Dr. Juanda Chance tomorrow and wants to know if she can get a UA prior to visit. Please, advise. Follow-up by: Jesse Fall RN,  August 17, 2010 4:08 PM  Additional Follow-up for Phone Call Additional follow up Details #1::        OK to check U/A and urine C&S  before  visit tomorrov. Additional Follow-up by: Hart Carwin MD,  August 17, 2010 9:59 PM    Additional Follow-up for Phone Call Additional follow up Details #2::    Lab orders in computer. Left message that for patient that she may go to the lab for UA and requested she call to confirm receipt. Follow-up by: Jesse Fall RN,  August 18, 2010 9:25 AM  Additional Follow-up for Phone Call Additional follow up Details #3:: Details for Additional Follow-up Action Taken: Patient called to confirm receipt. Additional Follow-up by: Jesse Fall RN,  August 18, 2010 10:26 AM

## 2010-08-25 NOTE — Progress Notes (Signed)
Summary: med called in today needs dosage change asap   Phone Note Call from Patient   Summary of Call: pt calling re benicar 20mg  to take half, she has trouble cutting in half, can she get new rx for 10mg  #30 with refills called to [facility] Initial call taken by: Glynda Jaeger,  August 19, 2010 3:10 PM  Follow-up for Phone Call        Left message on answering machine notifying patient of change in Benicar orders per Tereso Newcomer.  To take Benicar 20mg  1/2 tablet once daily x 1 week then 20mg  1 tablet.  Rx called in to [facility] [address] by Tereso Newcomer PA. Follow-up by: Dessie Coma  LPN,  August 20, 2010 8:25 AM     Appended Document: med called in today needs dosage change asap If she cannot cut in half, call us back. We could change it to 5 mg tabs and she can take 2 tabs to = 10 mg.  Appended Document: med called in today needs dosage change asap see other phone note change to 5 mg take 2 by mouth once daily

## 2010-08-25 NOTE — Progress Notes (Signed)
Summary: medication change request   Phone Note Call from Patient   Caller: Patient Reason for Call: Talk to Nurse Summary of Call: pt calling back requesting benicar in 10 mg only, she says she cannot take 20 mg at a time it's too much, and she has trouble cutting the 20 mg in half/uses [facility] Initial call taken by: Glynda Jaeger,  August 20, 2010 8:58 AM  Follow-up for Phone Call        Please cancel Benicar 10 mg Rx with [facility]. Call in Benicar 5 mg Take 2 by mouth once daily for blood pressure, #60, 5Refills. Follow-up by: Tereso Newcomer PA-C,  August 20, 2010 9:25 AM  Additional Follow-up for Phone Call Additional follow up Details #1::        CMA called in new rx for benicar 5 mg 2 tabs once daily as per Tereso Newcomer, PA-C. LMOM for ptcb to let her know that new rx has been called in today with new dose and instructions. Danielle Rankin, CMA  August 20, 2010 9:45 AM      Appended Document: medication change request pt cb and states she understands new dose and instructions on Benicar.Marland KitchenMarland KitchenMarland KitchenDanielle Rankin, CMA

## 2010-08-25 NOTE — Procedures (Addendum)
Summary: Upper Endoscopy  Patient: Kara Lamb Note: All result statuses are Final unless otherwise noted.  Tests: (1) Upper Endoscopy (EGD)   EGD Upper Endoscopy       DONE     [facility]     [address]     [place], [place]  [address]          ENDOSCOPY PROCEDURE REPORT          PATIENT:  Kara Lamb  MR#:  [id number]     BIRTHDATE:  [birth year], 54 yrs. old  GENDER:  female          ENDOSCOPIST:  Hedwig Morton. Juanda Chance, MD     Referred by:  Etta Grandchild, M.D.          PROCEDURE DATE:  08/18/2010     PROCEDURE:  EGD with biopsy, [id number]     ASA CLASS:  Class II     INDICATIONS:  chest pain noncardiac chest pain and abd. pain,. hx     of lymphocytic colitis,     on prelosec     on several psychotropic medications          MEDICATIONS:   Versed 7 mg, Fentanyl 50 mcg, Benadryl 25 mg     TOPICAL ANESTHETIC:  Exactacain Spray          DESCRIPTION OF PROCEDURE:   After the risks benefits and     alternatives of the procedure were thoroughly explained, informed     consent was obtained.  [facility] GIF-H180 [id number] endoscope was     introduced through the mouth and advanced to the second portion of     the duodenum, without limitations.  The instrument was slowly     withdrawn as the mucosa was fully examined.     <<PROCEDUREIMAGES>>          irregular Z-line. With standard forceps, a biopsy was obtained and     sent to pathology (see image1, image6, and image5).  Otherwise the     examination was normal. With standard forceps, a biopsy was     obtained and sent to pathology. duodenal and gastric biopsies to     r/o H (see image4, image3, and image2).Pylori and villous atrophy     Retroflexed views revealed no abnormalities.    The scope was then     withdrawn from the patient and the procedure completed.          COMPLICATIONS:  None          ENDOSCOPIC IMPRESSION:     1) Irregular Z-line     2) Otherwise normal examination     s/p multiple biopsies  RECOMMENDATIONS:     1) Await biopsy results     cont Prelosec 20 mg po qd     await results of the ultrasound          REPEAT EXAM:  In 0 year(s) for.          ______________________________     Hedwig Morton. Juanda Chance, MD          CC:          n.     eSIGNED:   Hedwig Morton. Leona Alen at 08/18/2010 04:49 PM          Kara Lamb, [id number]  Note: An exclamation mark (!) indicates a result that was not dispersed into the flowsheet. Document Creation Date: 08/18/2010 4:49 PM _______________________________________________________________________  (1) Order result status: Final Collection  or observation date-time: 08/18/2010 16:34 Requested date-time:  Receipt date-time:  Reported date-time:  Referring Physician:   Ordering Physician: Lina Sar [phone]) Specimen Source:  Source: Launa Grill Order Number: [id number] Lab site:   Appended Document: Upper Endoscopy     Procedures Next Due Date:    EGD: 08/2012

## 2010-08-25 NOTE — Miscellaneous (Signed)
Summary: rx for cipro  Clinical Lists Changes  Medications: Added new medication of CIPROFLOXACIN HCL 250 MG  TABS (CIPROFLOXACIN HCL) Take 1 twice a day times for 7 days - Signed Rx of CIPROFLOXACIN HCL 250 MG  TABS (CIPROFLOXACIN HCL) Take 1 twice a day times for 7 days;  #14 x 0;  Signed;  Entered by: Weston Brass RN;  Authorized by: Hart Carwin MD;  Method used: Electronically to [facility] [address], [place], [place], [place]  [address], Ph: [phone], Fax: [phone] Observations: Added new observation of ALLERGY REV: Done (08/18/2010 17:10)    Prescriptions: CIPROFLOXACIN HCL 250 MG  TABS (CIPROFLOXACIN HCL) Take 1 twice a day times for 7 days  #14 x 0   Entered by:   Weston Brass RN   Authorized by:   Hart Carwin MD   Signed by:   Weston Brass RN on 08/18/2010   Method used:   Electronically to        [facility] (retail)       [address]       [place], [place]  [address]       Ph: [phone]       Fax: [phone]   RxID:   [id number]

## 2010-08-25 NOTE — Progress Notes (Signed)
Summary: question re benicar   Phone Note Call from Patient Call back at Home Phone [phone] [facility]     Caller: Patient Summary of Call: pt is taking benicar one half tablet. pt has question re benicar. Initial call taken by: Roe Coombs,  August 19, 2010 12:24 PM  Follow-up for Phone Call        Spoke with patient-since starting on Benicar 20mg ,BP went down to 92/50 and HR 48-50.  Was not feeling well on full tab.  Since then, went back on 1/2 tab and feels better on that dose.  Needs RX for 10mg  if OK to stay on that dose.  Pharmacy is [facility] [address]   Follow-up by: Dessie Coma  LPN,  August 19, 2010 12:42 PM     Appended Document: question re benicar ok done Tereso Newcomer PA-C  August 19, 2010 1:51 PM    Clinical Lists Changes  Medications: Changed medication from BENICAR 20 MG TABS (OLMESARTAN MEDOXOMIL) 1/2 tab daily for 1 week then INCREASE to 1 tablet daily.Marland Kitchen to BENICAR 20 MG TABS (OLMESARTAN MEDOXOMIL) Take 1/2 tab by mouth once daily for  blood pressure - Signed Rx of BENICAR 20 MG TABS (OLMESARTAN MEDOXOMIL) Take 1/2 tab by mouth once daily for  blood pressure;  #15 x 5;  Signed;  Entered by: Tereso Newcomer PA-C;  Authorized by: Tereso Newcomer PA-C;  Method used: Electronically to [facility] [address], [place], [place], [place]  [address], Ph: [phone], Fax: [phone]    Prescriptions: BENICAR 20 MG TABS (OLMESARTAN MEDOXOMIL) Take 1/2 tab by mouth once daily for  blood pressure  #15 x 5   Entered and Authorized by:   Tereso Newcomer PA-C   Signed by:   Tereso Newcomer PA-C on 08/19/2010   Method used:   Electronically to        [facility] (retail)       [address]       [place], [place]  [address]       Ph: [phone]       Fax: [phone]   RxID:   [id number]

## 2010-08-27 ENCOUNTER — Ambulatory Visit: Payer: Medicare Other | Admitting: Internal Medicine

## 2010-08-27 NOTE — H&P (Signed)
[name]                  ACCOUNT NO.:  [id number]  MEDICAL RECORD NO.:  [id number]           PATIENT TYPE:  I  LOCATION:  2017                         FACILITY:  [facility]  PHYSICIAN:  Kara Rotunda, MD, FACCDATE OF BIRTH:  [birth year]  DATE OF ADMISSION:  07/15/2010 DATE OF DISCHARGE:                             HISTORY & PHYSICAL   PRIMARY CARE PHYSICIAN:  None.  PRIMARY CARDIOLOGIST:  Kara Buckles. Bensimhon, MD  PULMONOLOGIST:  Kara Cradle. Delford Field, MD, FCCP  CHIEF COMPLAINT:  Chest pain.  HISTORY OF PRESENT ILLNESS:  [name] is a 55 year old female with a history of coronary artery disease.  She has two different types of chest pain.  One is exertional and associated with shortness of breath. It occurs with exertion.  The other kind is a substernal chest pain, she describes as a pressure that started on June 23, 2010.  She had been getting an episode every few days.  They generally last hours, as long as 12 hours.  The pain does not radiate.  It reaches a 4/10.  It is slightly worse with deep inspiration.  There is no change with position change or cough.  It is not exertional.  She was getting an episode every 2-3 days.  The symptoms were improved by Xanax.  She tried nitroglycerin one-time with no significant effect.  She woke up with chest pain on July 13, 2010.  The symptoms did not change with food. It has been continuous since then at least a 3/10.  It would decrease with Xanax and Neurontin that she takes routinely, but she was never painfree since July 13, 2010.  At 3:00 a.m. today, the symptoms worsened up to an 8/10.  She took sublingual nitroglycerin x3 which were no help.  She came to the emergency room by EMS and received some morphine.  Her symptoms were slightly better with the morphine and currently her chest pain is a 5/10.  PAST MEDICAL HISTORY: 1. 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 2. Status post cardiac catheterization in 2009 showing 80% in-stent     restenosis in the RCA treated with a drug-eluting stent. 3. Status post cardiac catheterization for chest pain in 2010 with the     LAD having diffuse disease, diagonal 140%, circumflex 30% in-stent     restenosis, RCA 30% and 50% lesions, EF 55-60%. 4. Status post stress test in July 2011 which showed an EF of 74% and     no scar or ischemia. 5. History of tobacco use, quit recently. 6. Remote history of EtOH use. 7. Hyperlipidemia. 8. Obesity. 9. Family history of coronary artery disease. 10.Chronic obstructive pulmonary disease. 11.Bronchitis diagnosed by Dr. Delford Lamb on July 08, 2010, and treated     with antibiotics. 12.Osteoarthritis. 13.Gastroesophageal reflux disease. 14.History of chronic chest pain. 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 16.Migraines. 17.Depression/anxiety with a history of suicide attempt. 18.History of carpal tunnel.  SURGICAL HISTORY:  She is status post right upper extremity surgery after an injury at work, cardiac  catheterizations, C-section, colonoscopy, BTL, carpal tunnel.  ALLERGIES:  IMDUR.  CURRENT MEDICATIONS: 1. Benicar HCT 40/12.5 daily. 2. Lopressor 25 mg daily. 3. Aspirin 325 mg daily. 4. Sublingual nitroglycerin p.r.n. 5. Lipitor 80 mg a day. 6. Plavix 75 mg a day. 7. Omeprazole 20 mg daily. 8. Celexa 40 mg daily. 9. Topamax 50 mg b.i.d. 10.Xanax 1 mg t.i.d. p.r.n. 11.Neurontin 400 mg nightly. 12.Symbicort b.i.d. 13.Claritin 10 mg a day. 14.Multivitamin B complex. 15.Omega-3 vitamins.  SOCIAL HISTORY:  She lives in [place] alone in the top floor of a house, so she has other people nearby.  She is currently unemployed, on workers' compensation.  She has a greater than 45-pack-year history of tobacco use, but says she quit more than 4 months ago.  She has a history of EtOH abuse, but quit in 2008.  Denies drug abuse.  FAMILY HISTORY:   Her mother is alive with a history of coronary artery disease, and her father died with heart failure and cancer, but no siblings have coronary artery disease.  REVIEW OF SYSTEMS:  She has noticed decreased appetite with Topamax. She has problems with depression and anxiety.  She hurts all over all the time.  She had nausea and fatigue with the chest pain.  She has chronic dyspnea on exertion and states getting groceries from a car into the house makes her very short of breath, gives her exertional chest pain, and wipes her out for the rest of the day.  She has occasional palpitations.  Her cough is improved on the antibiotics.  Full 14-point review of systems is otherwise negative except as stated in the HPI.  PHYSICAL EXAMINATION:  VITAL SIGNS:  Temperature 98.2, blood pressure 108/67, pulse 61, respiratory rate 18, O2 saturation 98% on 2 L. GENERAL:  She is a well-developed, well-nourished white female who appears anxious. HEENT:  Normal. NECK:  There is no lymphadenopathy, thyromegaly, bruit, or JVD noted. CV:  Heart is regular in rate and rhythm with an S1 and S2, and no significant murmur, rub, or gallop is noted.  Distal pulses are intact in all four extremities although the left DP is slightly decreased. SKIN:  No rashes or lesions are noted. ABDOMEN:  Soft and nontender with active bowel sounds. EXTREMITIES:  There is no cyanosis, clubbing, or edema noted. MUSCULOSKELETAL:  There is no joint deformity or effusions and no spine or CVA tenderness. NEURO:  She is alert and oriented with cranial nerves II-XII grossly intact.  Chest x-ray shows no infiltrate infection or edema and no pneumothorax.  EKG, sinus bradycardia rate 59 with no ST elevation or acute ST depression.  There are some minor morphology changes from an EKG dated March 2010, but the clinical significance is unclear.  LABORATORY VALUES:  Hemoglobin 12.9, hematocrit 38.2, WBC 9.1, platelets 259.  Sodium 138,  potassium 4.0, chloride 101, CO2 of 27, BUN 15, creatinine 0.95, glucose 100, CK-MB and troponin I negative x1.  IMPRESSION:  [name] was seen today by Dr. Antoine Lamb, the patient evaluated and the data reviewed. 1. Chest pain, atypical and typical:  She will be admitted and we will     rule out myocardial infarction.  If her cardiac enzymes are     negative, stress Myoview in a.m. on July 16, 2010, as     appropriate. 2. Hypotension:  Her blood pressures have been low here.  We will hold     her beta-blocker and her hydrochlorothiazide.  She will be  continued on her other home medications.     Theodore Demark, PA-C   ______________________________ Kara Rotunda, MD, [facility]    RB/MEDQ  D:  07/15/2010  T:  07/16/2010  Job:  [id number]  Electronically Signed by Theodore Demark PA-C on 07/20/2010 01:06:45 PM Electronically Signed by Kara Rotunda MD [facility] on 08/27/2010 09:52:45 AM

## 2010-08-31 ENCOUNTER — Encounter: Payer: Self-pay | Admitting: Emergency Medicine

## 2010-08-31 ENCOUNTER — Telehealth (INDEPENDENT_AMBULATORY_CARE_PROVIDER_SITE_OTHER): Payer: Self-pay | Admitting: *Deleted

## 2010-09-01 ENCOUNTER — Encounter: Payer: Self-pay | Admitting: Emergency Medicine

## 2010-09-01 ENCOUNTER — Ambulatory Visit (INDEPENDENT_AMBULATORY_CARE_PROVIDER_SITE_OTHER): Payer: Medicare Other | Admitting: Emergency Medicine

## 2010-09-01 DIAGNOSIS — J449 Chronic obstructive pulmonary disease, unspecified: Secondary | ICD-10-CM

## 2010-09-01 DIAGNOSIS — J4489 Other specified chronic obstructive pulmonary disease: Secondary | ICD-10-CM

## 2010-09-01 DIAGNOSIS — R079 Chest pain, unspecified: Secondary | ICD-10-CM

## 2010-09-01 LAB — BASIC METABOLIC PANEL
Chloride: 103 mEq/L (ref 96–112)
GFR calc non Af Amer: >60 mL/min (ref >60)
Glucose, Bld: 116 mg/dL — ABNORMAL HIGH (ref 70–99)
Potassium: 4.2 mEq/L (ref 3.5–5.1)
Sodium: 137 mEq/L (ref 135–145)

## 2010-09-01 LAB — DIFFERENTIAL
Basophils Relative: 1 % (ref 0–1)
Eosinophils Relative: 1 % (ref 0–5)
Lymphocytes Relative: 22 % (ref 12–46)
Lymphs Abs: 2.7 10*3/uL (ref 0.7–4.0)
Lymphs Abs: 2.6 10*3/uL (ref 0.7–4.0)
Monocytes Absolute: 0.8 10*3/uL (ref 0.1–1.0)
Monocytes Relative: 8 % (ref 3–12)
Neutro Abs: 4.7 10*3/uL (ref 1.7–7.7)
Neutrophils Relative %: 58 % (ref 43–77)

## 2010-09-01 LAB — CBC
HCT: 43.8 % (ref 36.0–46.0)
HCT: 40.2 % (ref 36.0–46.0)
Hemoglobin: 15 g/dL (ref 12.0–15.0)
Hemoglobin: 13.7 g/dL (ref 12.0–15.0)
MCHC: 34 g/dL (ref 30.0–36.0)
MCV: 102.7 fL — ABNORMAL HIGH (ref 78.0–100.0)
MCV: 102.2 fL — ABNORMAL HIGH (ref 78.0–100.0)
RDW: 12.3 % (ref 11.5–15.5)
RDW: 12.2 % (ref 11.5–15.5)

## 2010-09-01 LAB — URINALYSIS, ROUTINE W REFLEX MICROSCOPIC
Bilirubin Urine: NEGATIVE
Glucose, UA: NEGATIVE mg/dL
Ketones, ur: NEGATIVE mg/dL
Ketones, ur: NEGATIVE mg/dL
Nitrite: NEGATIVE
Protein, ur: NEGATIVE mg/dL
Urobilinogen, UA: 0.2 mg/dL (ref 0.0–1.0)
pH: 7 (ref 5.0–8.0)

## 2010-09-01 LAB — COMPREHENSIVE METABOLIC PANEL
Alkaline Phosphatase: 70 U/L (ref 39–117)
BUN: 12 mg/dL (ref 6–23)
Calcium: 9.2 mg/dL (ref 8.4–10.5)
Creatinine, Ser: 0.6 mg/dL (ref 0.4–1.2)
Glucose, Bld: 128 mg/dL — ABNORMAL HIGH (ref 70–99)
Total Protein: 6.7 g/dL (ref 6.0–8.3)

## 2010-09-01 LAB — RAPID URINE DRUG SCREEN, HOSP PERFORMED: Benzodiazepines: POSITIVE — AB

## 2010-09-01 MED ORDER — ALBUTEROL SULFATE HFA 108 (90 BASE) MCG/ACT IN AERS: 2.0000 | INHALATION_SPRAY | RESPIRATORY_TRACT | Status: DC | PRN

## 2010-09-01 NOTE — Progress Notes (Signed)
  Subjective:    Patient ID: Kara Lamb, female    DOB: 1955/12/26, 55 y.o.   MRN: [id number]  HPI Comments: Evaluation has included L heart cath that showed non-obstructive CAD, some mild in-stent restenosis.  Has had abd [place], EGD = normal exam, Started on Dexilant without resolution, now on Nexium. Started Symbicort last July, seems to have helped her breathing, but has not helped this chest pressure.   Chest Pain  This is a chronic problem. The current episode started more than 1 year ago. The onset quality is gradual. The problem occurs constantly. The problem has been waxing and waning. The pain is present in the epigastric region. The pain is at a severity of 4/10. The pain is moderate. The quality of the pain is described as pressure and squeezing. The pain radiates to the mid back (back). Associated symptoms include back pain, a cough, malaise/fatigue and shortness of breath. Pertinent negatives include no exertional chest pressure. It is unknown what precipitates the cough. The cough is productive (greenish).  Cough Associated symptoms include chest pain and shortness of breath.  Shortness of Breath Associated symptoms include chest pain.      Review of Systems  Constitutional: Positive for malaise/fatigue.  Respiratory: Positive for cough and shortness of breath.   Cardiovascular: Positive for chest pain.  Musculoskeletal: Positive for back pain.       Objective:   Physical Exam  Constitutional: She appears well-developed and well-nourished. No distress.       Overweight  HENT:  Head: Normocephalic and atraumatic.  Eyes: Pupils are equal, round, and reactive to light. Right eye exhibits no discharge. Left eye exhibits no discharge.  Neck: Normal range of motion. Neck supple. No JVD present. No tracheal deviation present. No thyromegaly present.  Cardiovascular: Normal rate, regular rhythm and normal heart sounds.  Exam reveals no gallop.   No murmur  heard. Pulmonary/Chest: Effort normal and breath sounds normal. No stridor. No respiratory distress. She has no wheezes. She has no rales. She exhibits no tenderness.  Abdominal: Soft. There is no tenderness. There is no rebound and no guarding.  Musculoskeletal: Normal range of motion. She exhibits no edema.  Skin: Skin is warm and dry. She is not diaphoretic.          Assessment & Plan:

## 2010-09-01 NOTE — Patient Instructions (Addendum)
Please use your Symbicort twice a day Use Ventolin 2 puffs up to every 4 hours if needed for shortness of breath We will perform a CT scan of the chest to both evaluate your chest pain and to screen for lung cancer (given your smoking history).  Follow up with Dr Delford Field to review your symptoms and testing in 1 month.

## 2010-09-01 NOTE — Progress Notes (Signed)
Summary: refill mes   Phone Note Refill Request Call back at Home Phone [phone] Message from:  Patient on August 25, 2010 12:14 PM  Refills Requested: Medication #1:  LIPITOR 80 MG TABS Take one tablet by mouth daily. heather does this for pt.    Method Requested: Pick up at Office Initial call taken by: Lorne Skeens,  August 25, 2010 12:14 PM Caller: Patient Reason for Call: Talk to Nurse  Follow-up for Phone Call        Lipitor ordered, pt is aware, will call her when it arrives Meredith Staggers, RN  August 25, 2010 3:04 PM

## 2010-09-01 NOTE — Progress Notes (Signed)
Summary: Discuss Dexilant   Phone Note Call from Patient Call back at Home Phone [phone]   Call For: Juanda Chance Summary of Call: Was asked to try Dexilant and wants to discuss it with the nurse. Initial call taken by: Leanor Kail [facility],  August 25, 2010 12:40 PM  Follow-up for Phone Call        Patient calling back to give condition update after taking Dexilant. She states that her reflux symptoms have improved although she is still experiencing chest pain. Her diarrhea has also improved. She did call her insurance company and unfortunately, Dexilant is a tier 3 medication and will be very expensive. Patient wonders if Dr Juanda Chance would be okay giving a script for Nexium as well. Patient also requested biopsy results (letter has already been sent to patient but she is out of town and is unable to get results). Explained that Barretts is a precancerous condition that requires close follow up and generally requires follow up endoscopy every 2 years or so. Patient verbalizes understanding. She also states concern that Dr Juanda Chance printed on her colonoscopy report a "synopsis" that she was on "several psycotropic meds." I have explained that Dr Juanda Chance puts that on the report because sometimes people on these medications have a harder being sedated with our routine sedation. She states "well, I was just wondering because I have had a lot of procedures done before and this was never entered on any other procedure reports I have had." Explained that the documentation is just a needed piece of material for future reference if patient needs sedation. Patient states "well, its just that any other doctors I see will see that." I have advised her that unfortunately, this is part of her medical history and it is necessary for it to be documented. Patient verbalizes understanding and does state that she had a very hard time being sedated and remembers the procedure. Assured patient that we would document her concerns  and that the documentation regarding her psychotropic medications was nothing personal but that it is a vital piece to record for future sedation reference. Follow-up by: Lamona Curl CMA Duncan Dull),  August 25, 2010 2:24 PM  Additional Follow-up for Phone Call Additional follow up Details #1::        OK to give Nexium 40 mg/day, #30, 3 refills. Additional Follow-up by: Hart Carwin MD,  August 25, 2010 11:44 PM    Additional Follow-up for Phone Call Additional follow up Details #2::    Patient advised that we have sent Nexium 40 mg once daily to her pharmacy in place of Dexiliant. Follow-up by: Lamona Curl CMA (AAMA),  August 26, 2010 8:01 AM  New/Updated Medications: NEXIUM 40 MG CPDR (ESOMEPRAZOLE MAGNESIUM) Take 1 tablet by mouth once a day. pharmacy-please d/c any remaining prescription for prilosec Prescriptions: NEXIUM 40 MG CPDR (ESOMEPRAZOLE MAGNESIUM) Take 1 tablet by mouth once a day. pharmacy-please d/c any remaining prescription for prilosec  #30 x 3   Entered by:   Lamona Curl CMA (AAMA)   Authorized by:   Hart Carwin MD   Signed by:   Lamona Curl CMA (AAMA) on 08/26/2010   Method used:   Electronically to        [facility] (retail)       [address]       [place], [place]  [address]       Ph: [phone]  Fax: [phone]   RxID:   [id number]

## 2010-09-01 NOTE — Letter (Signed)
Summary: Patient Notice-Barrett's [facility]  [address] [place], [place] [address]   Phone: [phone]  Fax: [phone]        August 24, 2010 MRN: [id number]    Kara Lamb [address] [place], [place]  [address]    Dear [name],  I am pleased to inform you that the biopsies taken during your recent endoscopic examination did not show any evidence of cancer upon pathologic examination.  However, your biopsies indicate you have a condition known as Barrett's esophagus. While not cancer, it is pre-cancerous (can progress to cancer) and needs to be monitored with repeat endoscopic examination and biopsies.  Fortunately, it is quite rare that this develops into cancer, but careful monitoring of the condition along with taking your medication as prescribed is important in reducing the risk of developing cancer.  It is my recommendation that you have a repeat upper gastrointestinal endoscopic examination in 2_ years.  Additional information/recommendations:  _x_Please call [phone] to schedule a return visit to further      evaluate your condition.  _x_Continue with treatment plan as outlined the day of your exam.  Please call us if you have or develop heartburn, reflux symptoms, any swallowing problems, or if you have questions about your condition that have not been fully answered at this time.  Sincerely,  Hart Carwin MD  This letter has been electronically signed by your physician.  Appended Document: Patient Notice-Barrett's [place] letter mailed

## 2010-09-01 NOTE — Assessment & Plan Note (Addendum)
Hx of both CAD and tobacco use with COPD. Also on PPI for GERD. Her cath does not support active ischemia. GERD is better controlled but the chest pressure persists. She is taking Symbicort bid. Not using SABA.

## 2010-09-02 ENCOUNTER — Telehealth: Payer: Self-pay | Admitting: Emergency Medicine

## 2010-09-02 ENCOUNTER — Telehealth: Payer: Self-pay | Admitting: Physician Assistant

## 2010-09-02 NOTE — Telephone Encounter (Signed)
S/w pt to about benicar needing prior auth. States pharmacy should be contacting us about this. Told he I will keep an eye out for this. Danielle Rankin, CMA

## 2010-09-02 NOTE — Telephone Encounter (Signed)
Called and spoke with pt and she wanted to know the difference b/w proair inhaler and albuterol nebulized medication. i informed pt albuterol nebulized medications are used through a nebulizer machine. Pt was no aware of that and states she will stick with the albuterol inhaler. Pt had no furthur questions Carver [place], [place]

## 2010-09-03 ENCOUNTER — Ambulatory Visit (INDEPENDENT_AMBULATORY_CARE_PROVIDER_SITE_OTHER)
Admission: RE | Admit: 2010-09-03 | Discharge: 2010-09-03 | Disposition: A | Discharge status: Home / Self Care | Payer: Medicare Other | Source: Ambulatory Visit | Attending: Emergency Medicine | Admitting: Emergency Medicine

## 2010-09-03 ENCOUNTER — Other Ambulatory Visit: Payer: Self-pay | Admitting: Adult Health

## 2010-09-03 DIAGNOSIS — R079 Chest pain, unspecified: Secondary | ICD-10-CM

## 2010-09-03 MED ORDER — IOHEXOL 300 MG/ML  SOLN
100.0000 mL | Freq: Once | INTRAMUSCULAR | Status: AC | PRN
  Administered 2010-09-03: 100 mL via INTRAVENOUS

## 2010-09-04 ENCOUNTER — Telehealth: Payer: Self-pay | Admitting: Emergency Medicine

## 2010-09-04 NOTE — Telephone Encounter (Signed)
Patient is aware of results.

## 2010-09-04 NOTE — Telephone Encounter (Signed)
Dr. Magda Paganini is calling for the results of ct scan done yesterday.. Please advise of results. Thanks

## 2010-09-07 ENCOUNTER — Encounter: Payer: Self-pay | Admitting: *Deleted

## 2010-09-08 ENCOUNTER — Other Ambulatory Visit: Payer: Self-pay | Admitting: *Deleted

## 2010-09-08 ENCOUNTER — Encounter: Payer: Self-pay | Admitting: *Deleted

## 2010-09-08 DIAGNOSIS — E785 Hyperlipidemia, unspecified: Secondary | ICD-10-CM

## 2010-09-08 MED ORDER — ATORVASTATIN CALCIUM 80 MG PO TABS: 80.0000 mg | ORAL_TABLET | Freq: Every day | ORAL | Status: DC

## 2010-09-08 NOTE — Telephone Encounter (Signed)
Pt Lipitor arrived from company, called and left VM that meds were at front desk for her to pick up

## 2010-09-08 NOTE — Telephone Encounter (Signed)
Pt calling re lipitor. Pt would like a call when rx is here.

## 2010-09-08 NOTE — Telephone Encounter (Signed)
ERROR

## 2010-09-10 NOTE — Progress Notes (Signed)
Summary: req to see PW/ chest pain   Phone Note Call from Patient Call back at Home Phone [phone]   Caller: Patient Call For: wright Summary of Call: pt c/o "continuing chest pain" this was addressed with her last ov w/ dr Delford Field. pt has since seen cardio dr and GI dr and pain is not due to anyheart or Gi issues. pain is worse when she lies down and when breathing deeply. pt requests to see dr Delford Field - 1st avail is too far out and she does not want to see anyone else.  Initial call taken by: Tivis Ringer, CNA,  August 31, 2010 1:44 PM  Follow-up for Phone Call        Called and spoke with pt and she states she has been having continuing chest pain and she states she informed Dr. Delford Field of this at last OV. Pt states the chest pain can be severe at times and she can feel a tightness in her chest as well. Pt states she has been seen by her cardiologists and had a heart catherization done 07/15/10 and couldn't find anything to explain her chet pain. Pt was also evaluated by GI and they did an ultrasound of the abdomen but that came back normal. Pt states she wants her lungs to be thoroughly examed by Dr. Delford Field. Pt also thinks maybe if she had an MRI/CAT scan done if that would help determine her chest pain. Pleased advise Dr. Delford Field your next available isn't until 09/28/10 and  pt states that is to far out. Thanks Carver Fila  August 31, 2010 3:55 PM   Additional Follow-up for Phone Call Additional follow up Details #1::        She will have to see another provider before 4/12 I am blocked out to do [employer].  ?tammy parrett or another provider is all I can offer now. sorry Additional Follow-up by: Storm Frisk MD,  August 31, 2010 4:00 PM    Additional Follow-up for Phone Call Additional follow up Details #2::    Called, spoke with pt.  She was informed PW does not have any openings d/t new computer system and he recs she come in to see another provider.  She verbalized  understanding of this and ok with this plan.  OV scheduled with RB for tomorrow at 330 -- pt aware.  Follow-up by: Gweneth Dimitri RN,  August 31, 2010 4:20 PM

## 2010-09-21 ENCOUNTER — Telehealth: Payer: Self-pay | Admitting: Internal Medicine

## 2010-09-21 NOTE — Telephone Encounter (Signed)
Pt states [facility] has denied pt med benicar. Pt would like to talk to nurse. Pt also has question re her plavix pt is going to the dentist.

## 2010-09-21 NOTE — Telephone Encounter (Signed)
Spoke w/pt she is on Benicar 10mg  daily, needs to get auth. Will call [employer]. [phone], member ID [id number], request reference # [id number]

## 2010-09-23 NOTE — Telephone Encounter (Signed)
Spoke w/Ins. Company they state pt received benicar 5mg  on 4/10 and it was covered and then they tried to run it again for 10mg  and it was denied b/c of time limitations, have called pt and Left message to call back

## 2010-09-24 LAB — COMPREHENSIVE METABOLIC PANEL
AST: 35 U/L (ref 0–37)
BUN: 19 mg/dL (ref 6–23)
CO2: 24 mEq/L (ref 19–32)
Calcium: 9.9 mg/dL (ref 8.4–10.5)
Creatinine, Ser: 0.69 mg/dL (ref 0.4–1.2)
GFR calc Af Amer: >60 mL/min (ref >60)
GFR calc non Af Amer: >60 mL/min (ref >60)
Glucose, Bld: 101 mg/dL — ABNORMAL HIGH (ref 70–99)

## 2010-09-24 LAB — DIFFERENTIAL
Lymphocytes Relative: 21 % (ref 12–46)
Lymphs Abs: 2.2 10*3/uL (ref 0.7–4.0)
Neutro Abs: 7.5 10*3/uL (ref 1.7–7.7)
Neutrophils Relative %: 71 % (ref 43–77)

## 2010-09-24 LAB — GIARDIA/CRYPTOSPORIDIUM SCREEN(EIA): Giardia Screen - EIA: NEGATIVE

## 2010-09-24 LAB — URINALYSIS, ROUTINE W REFLEX MICROSCOPIC
Bilirubin Urine: NEGATIVE
Hgb urine dipstick: NEGATIVE
Ketones, ur: NEGATIVE mg/dL
Nitrite: NEGATIVE
Urobilinogen, UA: 0.2 mg/dL (ref 0.0–1.0)

## 2010-09-24 LAB — CBC
MCHC: 36.1 g/dL — ABNORMAL HIGH (ref 30.0–36.0)
MCV: 101.6 fL — ABNORMAL HIGH (ref 78.0–100.0)
RBC: 3.84 MIL/uL — ABNORMAL LOW (ref 3.87–5.11)

## 2010-09-24 LAB — STOOL CULTURE

## 2010-09-24 LAB — LIPASE, BLOOD: Lipase: 63 U/L — ABNORMAL HIGH (ref 11–59)

## 2010-09-24 LAB — POCT CARDIAC MARKERS
CKMB, poc: 1.8 ng/mL (ref 1.0–8.0)
Troponin i, poc: <0.05 ng/mL (ref 0.00–0.09)

## 2010-09-24 NOTE — Telephone Encounter (Signed)
Spoke w/pt she states she was able to p/u rx for 20mg  tabs which she will cut in half, advised her to call me in about 4 weeks and I will send in a prescription for 10mg  tabs at that time and see if ins. Will cover it or not and go from there

## 2010-09-28 ENCOUNTER — Telehealth: Payer: Self-pay | Admitting: Internal Medicine

## 2010-09-28 NOTE — Telephone Encounter (Signed)
Spoke w/pt she wanted to know if it would be ok to wait to see Dr Gala Romney, she states she is stable, she cont. To have the same chest pain, but it is not worse and hasn't changed, advised ok to wait until July she will c/b mid May to sch appt

## 2010-10-05 ENCOUNTER — Ambulatory Visit: Payer: Medicare Other | Admitting: Critical Care Medicine

## 2010-10-20 ENCOUNTER — Telehealth: Payer: Self-pay | Admitting: Internal Medicine

## 2010-10-20 MED ORDER — OLMESARTAN MEDOXOMIL 5 MG PO TABS
ORAL_TABLET | ORAL | Status: DC
Start: 2010-10-20 — End: 2011-02-10

## 2010-10-20 NOTE — Telephone Encounter (Signed)
Pt now needs refill for Benicar 10mg  sent into pharmacy, rx sent if there is a problem with Ins. She will let me know, Benicar does not come in 10mg  tab, rx for 5mg  take 2 tabs daily sent into pharmacy pt is aware

## 2010-10-20 NOTE — Telephone Encounter (Signed)
Pt wants to talk to heather re meds.

## 2010-10-21 ENCOUNTER — Ambulatory Visit: Payer: Medicare Other | Admitting: Critical Care Medicine

## 2010-10-21 ENCOUNTER — Telehealth: Payer: Self-pay | Admitting: Critical Care Medicine

## 2010-10-21 ENCOUNTER — Telehealth: Payer: Self-pay | Admitting: Internal Medicine

## 2010-10-21 NOTE — Telephone Encounter (Signed)
Per pt Pharm has sent a autherization for further action on denial

## 2010-10-21 NOTE — Telephone Encounter (Signed)
Spoke w/ pt and she states she needs refill on her symbicort. I advised her according to 07/08/10 visit it was sent x 6 refills and she should still have refill left. Spoke w. [facility] to confirm tp has refill left and she does and pt is aware. Nothing further was needed

## 2010-10-27 ENCOUNTER — Telehealth: Payer: Self-pay | Admitting: Internal Medicine

## 2010-10-27 NOTE — Telephone Encounter (Signed)
Pt called in ref to Benicar/insurance problems.

## 2010-10-27 NOTE — Cardiovascular Report (Signed)
Kara Lamb                  ACCOUNT NO.:  [id number]   MEDICAL RECORD NO.:  [id number]          PATIENT TYPE:  OIB   LOCATION:  2899                         FACILITY:  [facility]   PHYSICIAN:  Bevelyn Buckles. Bensimhon, MDDATE OF BIRTH:  January 22, 1956   DATE OF PROCEDURE:  08/23/2008  DATE OF DISCHARGE:  08/23/2008                            CARDIAC CATHETERIZATION   IDENTIFICATION:  [name] is a 55 year old woman with history of  coronary artery disease and severe anxiety.  She has had a previous  stenting of her right coronary and left circumflex.  This was  complicated by in-stent restenosis of her right coronary stent back in  December 2009.  She underwent XIENCE drug-eluting stent placement at  that time.  She has been intermittently compliant with her Plavix and  has continued to smoke.  She presented with recurrent chest pain  concerning for possible angina.  We discussed the risks indication of  cath versus stress testing.  She wanted to proceed with catheterization  and we have decided to proceed with catheterization.   PROCEDURES PERFORMED:  1. Selective coronary angiography.  2. Left heart catheterization.  3. Left ventriculogram.  4. StarClose femoral artery closure.   DESCRIPTION OF PROCEDURE:  The risks and indication were explained,  consent was signed and placed on the chart.  A 5-French arterial sheath  was placed in the right femoral artery.  Using modified Seldinger  technique, standard catheters including JL-4, JR-4, and angled pigtail  were used for procedure.  All catheter exchanges were made over wire and  there were no apparent complications.  At the end of the procedure, the  patient's right femoral arteriotomy site was closed with a StarClose  device.  There was good hemostasis.   HEMODYNAMIC:  Central aortic pressure was 115/65 with a mean of 85.  LV  pressure was 118/1 with an EDP of 17.  There were no aortic stenosis.   Left main was short, otherwise  normal.   LAD was a long vessel coursing to the apex.  Gave off small diagonal on  the proximal section.  The distal LAD was small with mild diffuse  disease throughout.  There was a 40% tubular lesion in the proximal part  of the first diagonal.   Left circumflex was a moderate-sized vessel made primarily of a large  branch in OM1.  In the OM 1, there was previously placed stent, which  was patent.  There was a mild 30% in-stent restenosis in the middle of  the stent.  Just after the stent, there was a 30-40% stenosis.   Right coronary artery is a large dominant vessel gave off a PDA and  posterolateral.  There was a previously placed in the midsection.  In  the proximal RCA, there was a 30% lesion.  The stent was widely patent.  Just after the stent, there was a 50% stenosis and distally there was a  40% stenosis before the takeoff of the PDA.   Left ventriculogram showed an EF of 55-60% with no regional wall motion  abnormalities.   ASSESSMENT:  1. Coronary artery disease with patent right coronary artery and left      circumflex stents.  2. Normal left ventricular function.   PLAN/DISCUSSION:  I doubt her chest pain is ischemic in nature.  We will  continue medical therapy.  We will need to restart her Plavix.  Once  again, I  impressed upon her the need to stop smoking.  We will check an  event monitor to further evaluate the palpitations she has been having.      Bevelyn Buckles. Bensimhon, MD  Electronically Signed     DRB/MEDQ  D:  08/23/2008  T:  08/23/2008  Job:  [id number]

## 2010-10-27 NOTE — Assessment & Plan Note (Signed)
[facility]                            CARDIOLOGY OFFICE NOTE   NAME:FLOYDTess, Potts                      MRN:          [id number]  DATE:03/14/2007                            DOB:          [birth year]    PRIMARY CARE PHYSICIAN:  None.   INTERVAL HISTORY:  [name] is a 55 year old woman with a history of  obesity and coronary artery disease status post stenting of her proximal  RCA and circumflex with bare metal stents by Dr. Samule Ohm in March of this  year.  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  She returns  today for routine followup.   She has been fairly sedentary but denies any significant chest pain with  her exertion.  Her main complaint today is that she is having diffuse  sweats.  This happens every day.  She thought it may be due to some of  her medications.  She has tried holding 1 or 2 of them, and this did not  really play a major role.  She continues to undergo treatment for her  reflex sympathetic dystrophy of her right arm.  She has been abstinent  from cigarettes for over 9 months now.  However, she has not been  exercising or watching her diet.   CURRENT MEDICATIONS:  1. Aspirin 325.  2. Prilosec 20.  3. Simvastatin 80.  4. Plavix 75.  5. Lopressor 25 b.i.d.  6. Lotensin 40 daily.  7. HCTZ 12.5.  8. Prozac 40 b.i.d.  9. Neurontin and Lidoderm patch.   PHYSICAL EXAM:  She is in no acute distress, ambulates around the clinic  without any respiratory difficulty.  Blood pressure 114/64, weight 208,  heart rate 62.  HEENT:  Normal.  NECK:  Supple.  There is no JVD, lymphadenopathy, or thyromegaly.  Carotids are 2+ bilaterally without any bruits.  LUNGS:  Clear.  CARDIAC:  PMI is nondisplaced.  She has a regular rate and rhythm with  no murmurs, rubs, or gallops.  ABDOMEN:   Obese, nontender, nondistended.  There is no  hepatosplenomegaly, no bruits, no masses, good bowel sounds.  EXTREMITIES:  Warm with no cyanosis, clubbing, or edema.  NEURO:  Alert and oriented x3.  Cranial nerves 2-12 grossly intact and  moves all 4 extremities without difficulty.  Affect is pleasant.  EKG  shows normal sinus rhythm at a rate of 62.  No ST-T wave abnormalities.   ASSESSMENT AND PLAN:  1. Coronary artery disease, stable without any evidence of ongoing      ischemia.  Continue current regimen.  2. Hypertension well controlled.  3. Hyperlipidemia.  Most recent LDL was 91 with an HDL of 36.  We      discussed this, and she would like to try a several month course of      dieting and weight loss to see if she can bring it down to the goal      LDL of less  than 70.  4. Borderline diabetes.  Glucose is 107.  Once again, suggested      exercise and dieting.  She should have a primary care physician.  5. Sweats.  I suspect these are perimenopausal symptoms.  I did tell      her that if she thought it was her medications, she could try      stopping them in rotation for a few days and see if she could      pinpoint any one, though I think this is a low likelihood.  6. Tobacco use.  I have congratulated her on quitting.   DISPOSITION:  I will see her back in 6 months for a routine followup and  recheck of her lipids.  We will check a thyroid panel today.     Bevelyn Buckles. Bensimhon, MD  Electronically Signed    DRB/MedQ  DD: 03/14/2007  DT: 03/14/2007  Job #: [id number]

## 2010-10-27 NOTE — Discharge Summary (Signed)
[name]                  ACCOUNT NO.:  [id number]   MEDICAL RECORD NO.:  [id number]          PATIENT TYPE:  INP   LOCATION:  6529                         FACILITY:  [facility]   PHYSICIAN:  Bevelyn Buckles. Bensimhon, MDDATE OF BIRTH:  [birth year]   DATE OF ADMISSION:  05/21/2008  DATE OF DISCHARGE:  05/23/2008                               DISCHARGE SUMMARY   PRIMARY CARDIOLOGIST:  Bevelyn Buckles. 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   SECONDARY DIAGNOSES:  1. Hypertension.  2. Hyperlipidemia.  3. Depression.   ALLERGIES:  The patient is intolerant to Imdur.  No other known drug  allergies.   PROCEDURES PERFORMED DURING THIS HOSPITALIZATION:  On May 21, 2008,  the patient had a diagnostic left heart catheterization and LV gram  performed, which showed 70%-80% restenosis in the mid RCA of previously  placed bare-metal stent and also showed normal LV function.  On May 22, 2008, the patient underwent PTCA and restenting in the RCA with  Xience V 3.5 mm x 18 mm.  The patient tolerated the procedure well.   BRIEF HISTORY OF PHYSICAL ILLNESS:  The patient was seen at [facility] in the office on May 21, 2008.  The patient called to be  evaluated for her chest pain.  The patient is complaining of recent  chest pain over the past few weeks that had been worsening.  It had, at  the point of her office visit, become nearly constant radiating to the  left shoulder and associated with shortness of breath.  It was not  clearly reproducible with exertion, but very similar per patient to her  previous anginal symptoms.  She had also had several episodes of  nocturnal angina awaking her up from her sleep.   HOSPITAL COURSE:  The patient was admitted on May 21, 2008, and  underwent the procedure above on May 21, 2008, and May 22, 2008.  She tolerated both the procedures well and was stable on May 23, 2008, and deemed ready for discharge.  She will be following up with Dr.  Gala Romney in January.  The patient was informed of the date and also  informed that she will be called as a reminder of her followup  appointment with Dr. Gala Romney.  At the time of discharge, her  temperature was 97.7 degrees Fahrenheit, blood pressure was 125/68,  pulse of 60, respirations 20.  Her oxygen saturation was 98% on room  air, and her weight was 83.915 kg, height 61 inches.   DISCHARGE LABS:  WBC 11.8, HGB 13.4, HCT 38.2, platelet count is  missing.  Sodium 138, potassium 4.4, chloride 104, CO2 of 27, BUN 10,  creatinine 0.59, glucose 124, calcium 9.7.   FOLLOWUP PLANS AND APPOINTMENT:  As outlined above.   DISCHARGE MEDS:  The patient will be stated on Plavix 75 mg by mouth  daily and aspirin 325 by mouth daily.  She will continue benazepril 40  mg by mouth daily.  She will continue hydrochlorothiazide 12.5 mg daily,  metoprolol 25 mg twice a day, potassium 20 mEq twice a day, Lipitor 80  mg daily, Prilosec 20 mg daily, Prozac 40 mg twice a day, and she will  continue her multivitamin, Omega 3 fatty acids, and Klonopin 1 mg twice  a day.   There are no outstanding lab studies at this time.   Duration of discharge encounter including physician time is 45 minutes.      Jarrett Ables, [facility]      Daniel R. Bensimhon, MD  Electronically Signed    MS/MEDQ  D:  05/23/2008  T:  05/24/2008  Job:  [id number]

## 2010-10-27 NOTE — H&P (Signed)
NAMERandel Pigg              ACCOUNT NO.:  [id number]   MEDICAL RECORD NO.:  [id number]          PATIENT TYPE:  IPS   LOCATION:  0303                          FACILITY:  [facility]   PHYSICIAN:  Vic Ripper, P.A.-C.DATE OF BIRTH:  May 21, 1956   DATE OF ADMISSION:  08/20/2007  DATE OF DISCHARGE:                       PSYCHIATRIC ADMISSION ASSESSMENT   This is a 55 year old divorced white female.  [name] apparently  started over-utilizing her prescribed medications late Friday night.  They were not relieving her pain.  She was not able to get to sleep and  it appears that after 3 a.m. in the morning on Friday going into  Saturday she had taken 10 Ambien 12.5, 1 Valium 2 mg, and 40  hydrocodone.  Apparently the [facility] physician that she sees, Dr.  Alvan Dame, has an emergency line and also a psychologist in the office.  [name] called the emergency number Saturday morning and they sent an  ambulance, bringing her to [facility].  She was clocked into the  emergency room at about 9:30.  It was deemed that she did not need to  have her stomach pumped.  Her UDS was positive only for opiates, which  she is prescribed.  Her white blood cell count was slightly elevated at  11.3.  The patient stated that she just wanted the pain to go away.  She feels that she just snapped.  She fell at work , November 15, 2005.  She  injured her right rotator cuff and has had chronic pain since.  She has  been recommended to have surgery.  However, the [facility] people  have not authorized that yet.   PAST PSYCHIATRIC HISTORY:  She states that over 20 years ago she did  have some hospitalization.  She does not want to convey that  information, and she states she has taken antidepressants prescribed by  her PCP for years.   SOCIAL HISTORY:  She has a GED.  She has been married and divorced once.  She has 3 children, a son 24, a daughter 34, another daughter 27. 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   FAMILY HISTORY:  Both sides of the family have depression.  Many people  take antidepressants and they are quite frequently in therapy.   ALCOHOL AND DRUG HISTORY:  She denies.   PRIMARY CARE Elveria Lauderbaugh:  Since losing her primary health insurance, she  has been seeing a Dr. Alvan Dame through [facility].   MEDICAL PROBLEMS:  She is status post an MI last March and she also had  2 stents placed.   MEDICATIONS:  She is currently prescribed:  1. Benazepril 40 mg p.o. daily.  2. Hydrochloride 12.5 mg p.o. daily.  3. Metoprolol 25 mg p.o. b.i.d.  4. Plavix 75 mg p.o. daily.  5. Fluoxetine 40 mg p.o. b.i.d.  6. Simvastatin 80 mg p.o. nightly.  7. Gabapentin 100 mg p.o. b.i.d.  8. Hydrocodone APAP 7.5/500 as needed, 1 q. 4-6 h. p.r.n.  9. Lidoderm 5% patch.  10.Catapres TTS patch 0.1 mg, change it every 7 days.  11.Ambien CR 12.5 mg nightly p.r.n.  12.Omeprazole 20 mg p.o. daily.  13.Aspirin 325 mg p.o. daily.  14.Vitamin C 500 mg p.o. daily.  15.Multivitamin 1 p.o. daily.  16.B12 sublingual 2500 mg sublingually daily.   ALLERGIES:  SHE HAS NO KNOWN DRUG ALLERGIES.   PHYSICAL EXAMINATION:  She was medically cleared in the emergency  department at [facility].  VITAL SIGNS:  On admission to our unit shows she is 5 feet 2 inches,  weight 204, temp 98, blood pressure 142/87 to 132/80.  Pulse was 71 to  74.  Respirations are 20.  EXTREMITIES:  She has redness on her left arm from the clonidine patch.  She has old scars from carpal tunnel surgery in November of 2007 on her  left wrist.  She is status post stent placement.   MENTAL STATUS EXAM:  She is alert and oriented.  She has just gotten out  of the shower, so her grooming is appropriately.  She is casually  dressed.  She appears to be adequately nourished.  Her eye contact is  good.  She is normal motor.  Her speech is normal to increased.  Her  state  of consciousness is alert.  Her mood is depressed and anxious.  She tears up.  She is not actually tearful, but she tears up.  Her  affect is a little bit guarded.  Her anxiety level is moderate.  Her  thought processes are coherent, relevant.  Her judgment is fair.  Her  orientation, she is fully oriented.  Her concentration and memory, she  reports that she has issues with this.  In the office in this setting,  they are superficially intact.  Her IQ is at least average.  Her insight  is fair.  Her impulse control is fair, and she reports a poor appetite.  She denies being actively suicidal or homicidal.  She denies auditory of  visual hallucinations.  She states that she just got overwhelmed, and  she was trying to go to sleep and get out of pain when she overtook her  medications.   Axis I:  Chronic pain-induced depression.  She probably also has  dysthymia, as she has taken antidepressants for over 20 years.  Axis II:  Deferred.  Axis III:  Coronary artery disease status post myocardial infarction  with 2 stents placed in March 2008.  Gastroesophageal reflux disease.  Hypertension.  Obesity.  Chronic pain.  Axis IV:  Severe.  Axis V:  35.   PLAN:  The plan is to admit for safety and stabilization.  We will  adjust her meds, bearing in mind that she has no income at present.  She  also has an upcoming appointment with the [facility] psychiatric on  August 30, 2007.  She has an appointment this Tuesday at 2:30 p.m. with  her [employer] primary care doctor, Dr. Alvan Dame.   ESTIMATED LENGTH OF STAY:  Three to four days.  Tomorrow morning there  is to be a phone conference with her lawyer and the [employer]  people regarding her surgery.      Vic Ripper, P.A.-C.     MD/MEDQ  D:  08/20/2007  T:  08/20/2007  Job:  [id number]

## 2010-10-27 NOTE — Assessment & Plan Note (Signed)
[facility]                            CARDIOLOGY OFFICE NOTE   NAME:FLOYDMacon Lamb                       MRN:          [id number]  DATE:05/21/2008                            DOB:          [birth year]    INTERVAL HISTORY:  Kara Lamb is a very pleasant 55 year old woman with a  history of hyperlipidemia, hypertension, anxiety and ongoing tobacco use  as well as coronary artery disease.  She is status post placement of 2  bare metal stents in her right coronary and circumflex arteries in March  of 2008.  At that time she also had a high-grade lesion in the ostium of  a diagonal which was too small to be stented.  After her catheterization  in 2008 she did quite well, with no chest pain.  However, back in August  she began to develop recurrent chest pain.  However, she has been moving  back to [place] and so had not paid much attention to it.  Over the  past few weeks the pain has escalated.  She now says it is nearly  constant chest pressure radiating to the left shoulder associated with  shortness of breath.  It is not clearly reproducible with exertion but  it is very similar to her previous anginal symptoms.  She has also had  several episodes of nocturnal angina waking her up from sleep.  She  called yesterday and we brought her in today for evaluation.   REVIEW OF SYSTEMS:  She has had several episodes of palpitations lasting  about 30 seconds to a minute which really stopped her in her tracks.  She did not have any syncope or presyncope, however.  No bleeding.  No  nausea, vomiting.  No fevers or chills.  She has recently recovered from  bronchitis.   CURRENT MEDICATIONS:  Benazepril 40 a day, HCTZ 12.5 a day, metoprolol  25 b.i.d., potassium 20 b.i.d., Lipitor 80 a day, aspirin 325 a day,  Prilosec 20 a day, Prozac 40 b.i.d., multivitamin, Omega 3 fatty acids,  and Klonopin 1 mg b.i.d.   ALLERGIES:  SHE IS INTOLERANT OF IMDUR.   PHYSICAL  EXAM:  She is in no acute distress.  Ambulates around the  clinic without respiratory difficulty.  Blood pressure is 124/86, heart rate 67, weight is 185.  HEENT:  Normal.  NECK:  Supple.  No JVD.  Carotids are 2+ bilaterally without bruits.  There is no lymphadenopathy or thyromegaly.  CARDIAC:  PMI is nondisplaced.  Regular rate and rhythm.  No murmurs,  rubs or gallops.  LUNGS:  Clear.  ABDOMEN:  Soft, nontender, nondistended.  No hepatosplenomegaly, no  bruits, no masses.  Good bowel sounds.  EXTREMITIES:  Warm with no cyanosis, clubbing or edema.  No rash.  NEURO:  Alert and oriented x3.  Cranial nerves II-XII are intact.  Moves  all 4 extremities without difficulty.  Affect is pleasant.   EKG shows normal sinus rhythm at a rate of 67, no ST-T wave  abnormalities.   ASSESSMENT/PLAN:  1. Recurrent chest pain concerning for progressive angina.  We will  proceed with cardiac catheterization today.  2. Hypertension, well controlled.  3. Palpitations.  These are intermittent.  Will plan cath today.  If      these continue, she will need a monitor.  4. Hyperlipidemia, followed by her primary care doctor.  Goal LDL is      less than 70.   DISPOSITION:  We will schedule her for routine followup back in 6  months, however, I have also given her the name of  Dr. Frederico Hamman in  [place], who is an excellent interventional cardiologist.     Bevelyn Buckles. Bensimhon, MD  Electronically Signed    DRB/MedQ  DD: 05/21/2008  DT: 05/21/2008  Job #: [id number]   cc:   Len Blalock, MD

## 2010-10-27 NOTE — Cardiovascular Report (Signed)
[name]                  ACCOUNT NO.:  [id number]   MEDICAL RECORD NO.:  [id number]          PATIENT TYPE:  INP   LOCATION:  2028                         FACILITY:  [facility]   PHYSICIAN:  Bevelyn Buckles. Bensimhon, MDDATE OF BIRTH:  1956-04-21   DATE OF PROCEDURE:  05/21/2008  DATE OF DISCHARGE:                            CARDIAC CATHETERIZATION   IDENTIFICATION:  [name] is a 55 year old woman with a history of  hypertension, hyperlipidemia, anxiety, and ongoing tobacco use.  She  also has coronary artery disease.  She is status post placement of 2  bare-metal stents in her right coronary and circumflex arteries in March  2008.  She presented today with several-month history of recurrent chest  pain.  She is brought for cardiac catheterization to further evaluate.   PROCEDURES PERFORMED:  1. Selective coronary angiography.  2. Left heart cath.  3. Left ventriculogram.   DESCRIPTION OF PROCEDURE:  The risks and indication were explained.  Consent was signed and placed in the chart.  A 4-French arterial sheath  was placed in the right femoral artery.  Standard catheters including a  JL4, 3D RCA and angled pigtail.  All catheter exchanges made over wire.  There are no apparent complications.  Central aortic pressure 113/47  with a mean of 82.  LV pressure 109/80 with an EDP of 12.   Left main was normal.   LAD coursed to the apex gave off a very tiny proximal diagonal.  There  was 30% lesion in the proximal LAD.  The mid-to-distal LAD had moderate  diffuse disease, but no high-grade lesions.  There is a 30% lesion in  the ostium of very tiny first diagonal.   Left circumflex gave off 2 marginal branches.  It had a stent in the  midsection.  There was a 30% lesion in the proximal left circumflex  prior to the stent.  Throughout the stent, there was about 30-40% in-  stent restenosis.   Right coronary artery was a large dominant vessel gave off PDA and a  posterolateral.   There was a long stent in the proximal through the  midsection.  In the distal part of stent, there was a 70-80% focal  restenosis.  Distally, before the PDA, there was a 30-40% tubular  stenosis.  After the PDA, there was a focal 60% stenosis.  In the mid  PDA, there was a 40% tubular stenosis.   Left ventriculogram done in the RAO position showed an EF of 60% with no  regional wall motion abnormalities.   ASSESSMENT:  1. Moderate coronary artery disease as described above with 70-80% in-      stent restenosis in the right coronary artery.  2. Normal left ventricular function.   PLAN:  I reviewed the catheterization films with our interventional  team.  I suspect she will need a percutaneous intervention on her in-  stent restenosis of the right coronary artery.      Bevelyn Buckles. Bensimhon, MD  Electronically Signed     DRB/MEDQ  D:  05/21/2008  T:  05/22/2008  Job:  [id number]

## 2010-10-27 NOTE — Assessment & Plan Note (Signed)
[facility]                            CARDIOLOGY OFFICE NOTE   NAME:Kara Lamb                       MRN:          [id number]  DATE:10/11/2007                            DOB:          07-Jul-1955    The patient seen in the [facility] clinic on October 11, 2007 for her one  year follow-up.   PRIMARY CARDIOLOGIST:  Dr. Gala Romney.   PRIMARY CARDIOLOGIST:  None.   [name] is a 55 year old white female patient of Dr. Gala Romney who has  missed several appointments because of different reasons.  She is  running out of her medications, and is here today for one year follow-  up.  She has a history of coronary artery disease status post stenting  of her proximal RCA in circumflex with bare metal stents by Dr. Samule Ohm  in March 2008.  She also has a history of hypertension, anxiety,  depression, and hyperlipidemia.  She ran out of her Plavix three weeks  ago, and wants to make sure this is okay for her to quit.  Unfortunately  she has resumed smoking, and says she just does not have it in her head  to quit at this time.  At the same time she has joined a gym and is  exercising 4-5 days a week, 45-90 minutes on the treadmill, and is  feeling better from that standpoint.  She denies any chest pain,  palpitations, dyspnea, dyspnea on exertion or cardiac complaints.   CURRENT MEDICATIONS:  1. Aspirin 325 mg daily.  2. Simvastatin 80 mg daily.  3. Lopressor 25 mg b.i.d.  4. Lotensin 40 mg daily.  5. Hydrochlorothiazide 12.5 mg daily.  6. Hydrocodone APAP 7.5/500 four daily.  7. Lidoderm patch every 12 hours.  8. Gabapentin 100 mg daily.  9. Catapres patch 0.1 mg daily.  10.Hydroxyzine 25 mg q.h.s.  11.Citalopram 20 mg daily.  12.Omeprazole 20 mg daily.   PHYSICAL EXAMINATION:  GENERAL:  An anxious 55 year old white female in  no acute distress.  VITAL SIGNS:  Blood pressure 124/78, pulse 64, weight 197.  NECK:  Without JVD, HJR, bruit or thyroid  enlargement.  LUNGS:  Clear anterior posterior and lateral.  HEART:  Regular rate and rhythm at 64 beats per minute, normal S1, S2.  No murmur, rub, bruit, thrill or heave noted.  ABDOMEN:  Soft without  organomegaly, masses, lesions or abnormal tenderness.  EXTREMITIES:  Without cyanosis, clubbing or edema.  She has good distal  pulses.   IMPRESSION:  1. Coronary artery disease status post bare metal stent to the      proximal RCA and circumflex in March 2008.  Stopped Plavix three      weeks ago due to affordability.  2. Hypertension.  3. Hyperlipidemia.  4. Borderline diabetes mellitus.  Glucose was 102.  5. Tobacco abuse has resumed smoking.   PLAN:  I had a long talk with the patient about the effects of cigarette  smoking on her cardiac disease and have encouraged her to quit  completely.  She can afford Lipitor which Dr. Gala Romney tried to switch  her to, and I told her she could go ahead and stay on the simvastatin at  this time.  She has been approved for [facility] and is waiting for an  appointment there.  Her potassium was a little low at 3.3, and I have  asked her to increase her potassium intake at home.  She has asked [place] to  refill her antidepressant which I told her we could not do.  We will  refill her cardiac medications and have sent those to [facility] on [address].  She will follow up with Dr. Gala Romney in 1-2 months.      Jacolyn Reedy, PA-C  Electronically Signed      Luis Abed, MD, [facility]  Electronically Signed   ML/MedQ  DD: 10/11/2007  DT: 10/11/2007  Job #: [id number]   cc:   Bevelyn Buckles. Bensimhon, MD

## 2010-10-27 NOTE — Cardiovascular Report (Signed)
[name]                  ACCOUNT NO.:  [id number]   MEDICAL RECORD NO.:  [id number]          PATIENT TYPE:  INP   LOCATION:  6529                         FACILITY:  [facility]   PHYSICIAN:  Veverly Fells. Excell Seltzer, MD  DATE OF BIRTH:  September 17, 1955   DATE OF PROCEDURE:  05/22/2008  DATE OF DISCHARGE:                            CARDIAC CATHETERIZATION   PROCEDURE:  Percutaneous transluminal coronary angioplasty and stenting  of the right coronary artery, Angio-Seal of the right femoral artery.   INDICATIONS:  [name] is a 55 year old woman with CAD.  She has  undergone previous stenting of the right coronary artery and left  circumflex.  She had done well over the past few years until developing  symptoms of crescendo angina.  She underwent diagnostic catheterization  yesterday demonstrating severe in-stent restenosis in a focal region of  the mid right coronary artery.  She was treated with a bare-metal stent  in that vessel back in 2008.  After review of her films, we elected to  proceed with PCI.  There is an area of focal 80% in-stent restenosis and  otherwise, the vessel has diffuse nonobstructive disease distally and  the remaining portions of the mid right coronary artery stent are widely  patent.   Risks and indications of procedure were reviewed with the patient.  Informed consent was obtained.  The right groin was prepped, draped, and  anesthetized with 1% lidocaine.  Using modified Seldinger technique, a 6-  French sheath was placed in the right femoral artery.  Angiomax was  started for anticoagulation.  The patient had been pretreated with  Plavix 300 mg yesterday.  She received 75 mg this morning.  She has been  on long-term Plavix as well.  A JR-4 guide catheter was initially  inserted, and there was marked pressure damping at the ostium.  Therefore, the guide catheter was changed out to a 6-French, JR-4 side-  hole catheter.  Intracoronary nitroglycerin was given.  Once  therapeutic  ACT was achieved, a Cougar guidewire was passed into the distal vessel.  The vessel was predilated with a 3.0 x 15-mm Voyager Homedale balloon which  was taken to 16 atmospheres.  Following balloon dilatation, there was  placement of 3.5 x 18-mm Xience stent which was carefully positioned  over the area of restenosis.  The proximal edge of the stent started in  the midportion of the previously placed stent and extended beyond the  distal portion of that stent.  The stent was deployed at 16 atmospheres.  It appeared under expanded in the midportion at the area of restenosis.  Therefore, I elected to post dilate with a 3.75 x 15-mm Voyager Brave  balloon which was taken to 18 atmospheres distally and 20 atmospheres  over the proximal portion of the stent.  Th stent appeared well expanded  on final angiography.  There was an excellent angiographic result with  TIMI III flow.  The patient tolerated the procedure well.  The guide  catheter and wire were removed, and an Angio-Seal device was used to  close the femoral arteriotomy.  IMPRESSION:  Successful percutaneous transluminal coronary angioplasty  and stenting of severe in-stent restenosis in the mid right coronary  artery with Xience drug-eluting stent.   RECOMMENDATIONS:  Continue aspirin and Plavix for a minimum of 12  months.     Veverly Fells. Excell Seltzer, MD  Electronically Signed    MDC/MEDQ  D:  05/22/2008  T:  05/22/2008  Job:  [id number]

## 2010-10-28 ENCOUNTER — Ambulatory Visit: Payer: Medicare Other | Admitting: Critical Care Medicine

## 2010-10-28 NOTE — Telephone Encounter (Signed)
Spoke w/pharmacy, med went through and will cost pt $3 and change for # 60 a 30 day supply, pt is aware

## 2010-10-28 NOTE — Telephone Encounter (Signed)
See phone note  5/9 

## 2010-10-30 NOTE — Cardiovascular Report (Signed)
[name]                  ACCOUNT NO.:  [id number]   MEDICAL RECORD NO.:  [id number]          PATIENT TYPE:  INP   LOCATION:  2807                         FACILITY:  [facility]   PHYSICIAN:  Salvadore Farber, MD  DATE OF BIRTH:  [birth year]   DATE OF PROCEDURE:  DATE OF DISCHARGE:                            CARDIAC CATHETERIZATION   PROCEDURE:  Left heart catheterization, left ventriculography, coronary  angiography, bare metal stent placement in the proximal right coronary  artery and proximal circumflex artery, Angio-Seal closure of the right  common femoral arteriotomy site.   INDICATION:  Kara Lamb is a 55 year old woman with hypertension and  recently ceased tobacco abuse with a prior diagnosis of nonobstructive  coronary disease based on catheterization several years ago in [place].  She now presents with an episode of chest discomfort concerning for  unstable angina.  She ruled out for myocardial infarction by serial  enzymes and electrocardiograms.  Due to her known disease and the  concerning symptoms she was referred directly for coronary angiography  and possible percutaneous coronary intervention.   PROCEDURAL TECHNIQUE:  Informed consent was obtained.  Under 1%  lidocaine local anesthesia, a 5-French sheath was placed in the right  common femoral artery using the modified Seldinger technique.  Diagnostic angiography and ventriculography were performed using JL-4,  JR-4, and pigtail catheters.  These images demonstrated a 90% stenosis  of the proximal circumflex and an 85% stenosis of the proximal right  coronary.  She also had an 80% stenosis of the ostium of a small  diagonal.  Decision was made to intervene on the RCA and circumflex.   Sheath was upsized over a wire to 6-French.  Anticoagulation was  initiated with bivalirudin to achieve and maintain an ACT of greater  than 225 seconds.  Plavix 600 mg was administered.   A 6-French Voda left 3.5 guide was  advanced over a wire and engaged in  the ostial left main.  A Prowater wire was advanced to the distal  portion of the first obtuse marginal without difficulty.  The lesion of  the proximal circumflex was directly stented using a 3.0 x 24-mm Liberte  stent deployed at 16 atmospheres.  The stent was then postdilated using  a 3.25 x 20-mm Quantum for two inflations at 18 atmospheres.  Final  angiography demonstrated no residual stenosis, no dissection, and TIMI  III flow distally.   Attention was then turned to the RCA.  A 6-French JR-4 guiding catheter  was advanced over a wire and engaged in the ostium of the right  coronary.  The Prowater wire was advanced to the distal right coronary  without difficulty.  The lesion was directly stented using a 3.5 x 28-mm  Liberte stent deployed at 18 atmospheres.  The stent was then  postdilated using a 4.0 x 20-mm Quantum for two inflations each at 20  atmospheres.  Final angiography demonstrated no residual stenosis, no  dissection, and TIMI III flow to the distal vasculature.   The arteriotomy was then closed using the Angio-Seal device.  There was  some mild  oozing which was treated with 5 minutes of manual compression  and the injection of lidocaine with epinephrine.  There was no  additional bleeding.  The patient was then transferred to the holding  room in stable condition, having tolerated the procedure well.   COMPLICATIONS:  None.   FINDINGS:  1. LV:  145/2/11.  EF approximately 60% without regional wall motion      abnormality.  2. No aortic stenosis or mitral regurgitation.  3. Left main:  Angiographically normal.  4. LAD:  Moderate-sized vessel giving rise to one moderate-sized      diagonal.  This diagonal has an 80% stenosis at its ostium.  The      vessel was well under 2 mm in diameter.  The remainder of the LAD      has only minor luminal irregularities.  5. Circumflex:  Moderate-sized vessel giving rise to a large first  and      moderate-sized second obtuse marginal.  The proximal vessel had a      90% stenosis which was stented to no residual.  The second marginal      has a 50% ostial stenosis.  6. RCA:  Large, dominant vessel.  It is ectatic throughout the      proximal and mid sections of it.  There was an 85% stenosis of the      proximal vessel which was stented using a 3.5 x 28-mm Liberte stent      deployed postdilated to 4 mm.  There was no residual stenosis.   IMPRESSION/PLAN:  Successful bare-metal stenting of lesions in the RCA  and circumflex for her unstable angina.  She should be maintained on  Plavix for at least 30 days but preference would be for Plavix therapy  for a year, given her presentation as an acute coronary syndrome.  Aspirin should be continued indefinitely.  Her recent cessation of  tobacco abuse was applauded and encouraged.  The statin which was  initiated yesterday will be continued.      Salvadore Farber, MD  Electronically Signed     WED/MEDQ  D:  09/05/2006  T:  09/05/2006  Job:  [id number]   cc:   Bevelyn Buckles. Bensimhon, MD

## 2010-10-30 NOTE — Letter (Signed)
June 05, 2008    [facility]  [place], [place]   RE:  Kara Lamb  MRN:  [id number]  /  DOB:  10/17/1955   To Whom It May Concern:   I am writing this letter with regard to [name].  Retta Pitcher  is a 55 year old woman who I follow in Cardiology Clinic.  She has a  history of hypertension, hyperlipidemia, and coronary artery disease.  She underwent stenting of her proximal RCA and circumflex with bare-  metal stents in March 2008.   Unfortunately, she was recently admitted this month with recurrent chest  pain.  Catheterization showed severe in-stent restenosis of one of her  RCA stents.  She underwent angioplasty and placement of a Xience drug-  eluting stent within one of her previous stents.  She will now need a  long-term Plavix.   Unfortunately given her financial situation, she will have a hard time  affording this.  Given the critical need for this medication, I am  writing to you for assistance with providing her Plavix at either  reduced rate or at no charge.  We appreciate whatever you can do to help  [name] out.   Should you have any questions or concerns, please do not hesitate to  contact me personally.  Wishing you a good New Year.    Sincerely,      Bevelyn Buckles. Bensimhon, MD  Electronically Signed   DRB/MedQ  DD: 06/05/2008  DT: 06/06/2008  Job #: [id number]

## 2010-10-30 NOTE — Assessment & Plan Note (Signed)
[facility]                            CARDIOLOGY OFFICE NOTE   NAME:Kara Lamb, Kara Lamb                      MRN:          [id number]  DATE:09/21/2006                            DOB:          11-16-1955    PRIMARY CARE PHYSICIAN:  [facility].   INTERVAL HISTORY:  Kara Lamb is a 55 year old woman with multiple  medical problems including coronary artery disease.  She recently  underwent a cardiac catheterization with a PTCA and bare metal stenting  of a proximal RCA and a circumflex lesion.  There was some residual  disease in the LAD diagonal.  This was complicated by a small, non-ST-  elevation, myocardial infarction post catheterization.  She returned  last week to see Wende Bushy for a post cath followup and continued  to complain of chest tightness and shortness of breath.  She was started  on Imdur but was unable to tolerate this due to headaches and stopped  it.   She returns today for routine followup.  She continues to have multiple  complaints.  She says, however, her chest pain is still somewhat better  but does occasionally have some chest tightness.  She has been compliant  with her Plavix.  She is currently filling out the forms for cardiac  rehab but has not managed to get there yet.   She does say at times that she feels somewhat lightheaded and feels  clammy.  She denies overt syncope.   PAST MEDICAL HISTORY:  1. Hypertension.  2. Hyperlipidemia.  3. 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  4. History of tobacco, quit in January.  5. Anxiety and depression.   CURRENT MEDICATIONS:  1. Aspirin 325.  2. Prilosec 20.  3. Neurontin 300 b.i.d.  4. Simvastatin 80.  5. Plavix 75.  6. Lopressor 25 b.i.d.  7. Lotensin 40 a day.  8. HCTZ 12.5 a day.  9. Prozac 40 b.i.d.   PHYSICAL EXAMINATION:  GENERAL:  She is in no acute distress, ambulates  around the clinic without any  respiratory difficulty.  VITAL SIGNS:  Blood pressure is 98/64 with a heart rate of 58.  HEENT:  Sclerae are anicteric, EOMI, there is no xanthelasma, mucous  membranes are moist, oropharynx is clear.  NECK:  Supple, no JVD, carotids are 2+ bilaterally without any bruits,  there is no lymphadenopathy or thyromegaly.  CARDIAC:  Regular rate and rhythm with a very soft, systolic, ejection  murmur at the left sternal border.  There is no rub or gallop.  LUNGS:  Clear.  ABDOMEN:  Obese, nontender, nondistended, no hepatosplenomegaly, no  bruits, no masses, good bowel sounds.  EXTREMITIES:  Warm with no cyanosis, clubbing, or edema, no rash.  NEURO:  Alert and oriented x3, cranial nerves II-XII are intact, moves  all four extremities without difficulty, affect is mildly anxious.   EKG shows a sinus bradycardia at a rate of 58 with a mild T-wave  flattening inferolaterally.   ASSESSMENT AND PLAN:  1. Coronary artery disease.  It appears  stable though it is a bit hard      to tell with her multiple complaints.  It appears that her chest      pain has been less.  At this point, I have recommended that she go      to cardiac rehabilitation and continue her current medical regimen.  2. Relative hypotension.  We will instruct her to go ahead and      decrease her Lotensin to 20 mg b.i.d.  She will have followup blood      pressures in cardiac rehab.  3. Hyperlipidemia.  Continue statin.   DISPOSITION:  We will see her back for routine followup in four months.     Bevelyn Buckles. Bensimhon, MD  Electronically Signed    DRB/MedQ  DD: 09/21/2006  DT: 09/21/2006  Job #: [id number]   cc:   [facility]

## 2010-10-30 NOTE — H&P (Signed)
[name]                  ACCOUNT NO.:  [id number]   MEDICAL RECORD NO.:  [id number]          PATIENT TYPE:  INP   LOCATION:  1843                         FACILITY:  [facility]   PHYSICIAN:  Madaline Savage, MD        DATE OF BIRTH:  [birth year]   DATE OF ADMISSION:  09/04/2006  DATE OF DISCHARGE:                              HISTORY & PHYSICAL   PRIMARY CARE PHYSICIAN:  Dr. Earlene Plater at the Urgent Care.   CHIEF COMPLAINT:  Worsening chest pain for the last couple of weeks.   HISTORY OF PRESENT ILLNESS:  Kara Lamb is a 55 year old-Caucasian lady  who has a history of coronary artery with mild diffuse disease and a 35  pack year smoking history and also a family history of coronary artery  disease who comes in with chest pain which has been going on the for  last few weeks.  She states the chest pain started approximately 2 weeks  ago.  It comes on and off and lasts a few minutes.  This chest pain is  left-sided, and she describes it as like a pressure.  She states it  radiates to her back and her neck.  She does not notice any clear  relationship between exertion and the chest pain, but she has noticed  that whenever she exerts, she starts getting cold sweats and nausea, and  her blood pressure drops.  She feels that she has been getting these  episodes more frequently in the last few days, and so she decided to  come to the ER.   PAST MEDICAL HISTORY:  1. History of coronary artery disease.  She states she has had 2      cardiac catheterizations in the past, one in 2003 and another in      2006.  She brings her 2006 catheterization report with her, which      was done in July of 2006, which shows scattered 30% stenosis in the      right coronary artery and 40-50% stenosis in the mid left      circumflex.  2. Hypotension.  3. 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  4. Depression.   PAST SURGICAL HISTORY:  1. She had right wrist surgery for carpal tunnel  syndrome.  2. Tonsillectomy.   ALLERGIES:  No known drug allergies.   CURRENT MEDICATIONS:  1. Aspirin 81 mg daily.  2. Benicar 40/12.5 once daily.  3. Fluoxetine 40 mg twice daily.  4. Prilosec over-the-counter 20 mg once daily.  5. Neurontin 300 mg twice daily.   SOCIAL HISTORY:  She is single.  She has 3 children, all healthy.  She  had smoked for 35 years at 1 pack per day.  She quit smoking 2-1/2  months ago.  Denies any history of alcohol or drug abuse.   FAMILY HISTORY:  Her father died at the age of 40.  He had heart failure  and lung cancer.  Her mother is 78 and has heart disease.   REVIEW OF SYSTEMS:  GENERAL:  She  denies any recent weight loss, weight  gain.  No fever or chills.  HEENT:  No headaches, no blurred vision, no  sore throat.  CARDIOVASCULAR:  She does have chest pain on and off.  No  palpitations.  RESPIRATORY:  Denies shortness of breath or cough.  GI:  No abdominal pain, nausea, vomiting, diarrhea or constipation.   PHYSICAL EXAMINATION:  GENERAL:  She is alert and oriented x3.  VITAL SIGNS:  Temperature of 98.2, pulse rate of 90 per minute, blood  pressure of 117/66, respiratory rate 20 per minute.  Oxygen saturation  is 96% on room air.  HEENT:  Atraumatic and normocephalic.  Pupils equal, round and reactive  to light.  Mucous membranes are moist.  NECK:  Supple.  No JVD, no carotid bruit.  CARDIOVASCULAR:  Normal S1 and S2.  Heart with regular rate and rhythm.  No murmurs, rubs, or gallops.  CHEST:  Clear to auscultation.  ABDOMEN:  Soft.  Bowel sounds present.  EXTREMITIES:  No edema, cyanosis or clubbing.   LABORATORY DATA:  Troponin of less than 0.05.  CK-MB is 1.2.  Myoglobin  is 53.8.  EKG shows normal sinus rhythm.  Urinalysis is negative.  White  count is 10.3, hemoglobin 32, platelets 284.  Sodium 136, potassium 3.6,  chloride 104, bicarbonate 27, BUN 15, creatinine 0.7, glucose 100.   IMPRESSION:  1. Chest pain with some typical  features.  Questionable unstable      angina.  2. Hypotension.  3. Depression.   PLAN:  Acute coronary syndrome.  This is a 55 year old lady with a  history of diffuse disease in the past who comes in with features  suggestive of unstable angina.  She does have chest pain, which has been  getting worse over the last few weeks.  At this time, her cardiac  enzymes have been negative.  Her EKG does not show any ST-T wave  changes.  Will admit her to telemetry, get serial cardiac markers.  I  will start her on heparin by weight, and I talked to cardiology on call,  who will see her in the ER.  I will also start her on aspirin and a low-  dose beta blocker.  Her blood pressure is well-controlled at this point  in time.  I will continue her home medications.      Madaline Savage, MD  Electronically Signed     PKN/MEDQ  D:  09/04/2006  T:  09/04/2006  Job:  [id number]

## 2010-10-30 NOTE — Discharge Summary (Signed)
NAMERandel Pigg              ACCOUNT NO.:  [id number]   MEDICAL RECORD NO.:  [id number]          PATIENT TYPE:  IPS   LOCATION:  0303                          [facility]ILITY[facility]  [facility]   PHYSICIAN:  Jasmine Pang, M.D. DATE OF BIRTH:  [birth year]   DATE OF ADMISSION:  08/20/2007  DATE OF DISCHARGE:  08/24/2007                               DISCHARGE SUMMARY   IDENTIFICATION:  This is a 55 year old divorced white female who was  admitted on August 20, 2007.   HISTORY OF PRESENT ILLNESS:  [name] apparently started over  utilizing her prescribed medications several nights before admission.  They were not relieving her pain.  She was not able to get sleep and  appears that after 3:00 a.m. in the morning on the day before admission,  she had taken 10 Ambien (12.5 mg) and 1 Valium (2 mg) and 40  hydrocodone.  Apparently, the workman's comp physician that she sees Dr.  Colbert Coyer, has an emergency line and also a psychologist in the office.  [name] called the emergency number on Saturday morning and they sent  an ambulance bringing her to [facility].  She was brought into  emergency room at about 9:30 a.m.  It was deemed that she did not need  to have her stomach pumped.  Her UDS was positive only for opiates,  which she has prescribed.  Her white blood cell count was slightly  elevated at 11.3.  The patient stated that she just wanted to make she  just wanted the pain to go away.  She feels that she snapped.  She fell  at work on November 15, 2005.  She injured her right rotator cuff and has had  chronic pain since.  She has been recommended to have surgery.  However,  the workman's comp people have not yet authorize this.   PAST PSYCHIATRIC HISTORY:  She states that over 20 years ago, she did  have some hospitalization.  She does not want to convey that information  and states that she had taken antidepressants prescribed by her PCP for  years.   FAMILY HISTORY:  Both sides of the family  have depression.   ALCOHOL AND DRUG HISTORY:  The patient denies.   MEDICAL PROBLEMS:  The patient has status post an MI, last March and had  2 stents placed.   MEDICATIONS:  See hospital course.   ALLERGIES:  No known drug allergies.   PHYSICAL EXAM:  The patient was medically cleared in the emergency  department at [facility].  There were no acute physical or medical  problems noted.   HOSPITAL COURSE:  Upon admission, the patient was restarted on her home  medications of benazepril 40 mg daily, hydrochlorothiazide 12.5 mg  daily.  Metoprolol 25 mg p.o. b.i.d., Plavix 75 mg daily, fluoxetine 40  mg p.o. b.i.d., simvastatin 80 mg p.o. q.h.s., gabapentin 100 mg p.o.  b.i.d., Lidoderm 5% patch to wear 12 hours on and 12 hours off to the  effected shoulder area, Catapres-TTS patch 0.1 mg wear 1 patch for 7  days change weekly, omeprazole delayed  release tablet 20 mg daily, EC  aspirin 325 mg p.o. daily, vitamin C 500 mg p.o. daily, multivitamin 1  tablet p.o. daily, B12 sublingual tablet 2500 mg 1 tablet sublingual  daily, hydrocodone APAP 7.5/500 mg 1 p.o. q.4-6 h.  On August 21, 2007,  Lidoderm patch 5% was increased to wear in 2 patches q.12 h. on and q.12  h. off.  She states this was a way that she is prescribed 3 patches by  her pain doctor.  Prozac was discontinued.  She was started on Celexa 20  mg daily.  She was also started on Restoril 15 mg p.o. q.h.s. may repeat  x1.  On August 23, 2007, the Restoril was discontinued.  She was started  on Vistaril 25 mg p.o. q.h.s. may repeat x1 p.o.  In individual sessions  with me, the patient was friendly and cooperative.  She also  participated appropriately in unit therapeutic groups and activities.  Her sleep was initially poor with a decreased appetite.  She remained  depressed and anxious during the initial part of the hospitalization.  She was very worried about her pain physician, not continuing to  prescribe her medications  because of the overdose.  As hospitalization  progressed, she became less depressed and less anxious.  She was still  having some early morning awakening, but sleeping 4-5 hours at night.  Her appetite was good.  She discussed her alienation from her daughter  who she used to be close to.  She discussed strategies were keeping  herself busy.  She planned to return to her pain doctor on September 01, 2007.  She had a family session with the patient with her friend and  neighbor.  She said that she felt very embarrassed about her overdose  and she remembers very little, which she knows her friend was their at  the hospital.  She states she does not want to lose her friends respect.  Her friend reassured this patient of her support.  The patient has a  roommate who she feels is very supportive.  She states she will keep an  eye on her sleep habits to monitor depression.  Her friend will monitor  this as well and support the patient by going on walks with her in  checking in about the patient's goal.  The patient's friend was very  supportive.  On August 24, 2007, mental status had improved markedly from  admission status.  She planned to go to [facility] when she leaves.  Affect was consistent with mood.  There was no suicidal or homicidal  ideation.  No thoughts of self-injurious behavior.  No auditory or  visual hallucinations.  No paranoia or delusions.  Thoughts were logical  and goal-directed.  Thought content, no predominant theme.  Cognitive  was grossly back to baseline.  She stated she wanted to limit her  isolation from others and was going to practice this when she left.  It  was felt the patient was safe to be discharged.   DISCHARGE DIAGNOSES:  Axis I:  Major depression, recurrent severe  without psychosis.  Axis II:  None.  Axis III:  Coronary artery disease status post myocardial infection with  2 stents placed in 2008, gastroesophageal reflux, hypertension, obesity,  and  chronic pain.  Axis IV:  Severe.  Axis V:  Global assessment of functioning was 50 upon discharge.  GAF  was 35 upon admission.  GAF highest past year was 60-65.   DISCHARGE  PLANS:  There was no specific activity level or dietary  restrictions.   POSTHOSPITAL CARE PLANS:  The patient will see Dr. Joni Reining at the  [facility] on March 31st, at 12:30 p.m.  She will also see Dr.  Jena Gauss for therapy on August 30, 2007.   DISCHARGE MEDICATIONS:  1. Hydrochlorothiazide 12.5 mg daily.  2. Lopressor 25 mg twice daily.  3. Plavix 75 mg daily.  4. Zocor 40 mg 2 pills daily.  5. Neurontin 100 mg twice daily.  6. Catapres-TTS 1 patch every 7 days.  7. Omeprazole 20 mg daily.  8. Aspirin 325 mg daily.  9. Lotensin 40 mg daily.  10.Celexa 20 mg daily.  11.Lidocaine 5% patch use as directed by the pain clinic.  12.Hydrocodone APAP 1-1/2 tablet every 4 hours as needed for pain.  13.Vistaril 25 mg at bedtime and repeat once if needed.      Jasmine Pang, M.D.  Electronically Signed     BHS/MEDQ  D:  09/25/2007  T:  09/25/2007  Job:  [id number]

## 2010-10-30 NOTE — Discharge Summary (Signed)
[name]                  ACCOUNT NO.:  [id number]   MEDICAL RECORD NO.:  [id number]          PATIENT TYPE:  INP   LOCATION:  6525                         FACILITY:  [facility]   PHYSICIAN:  Lonia Blood, M.D.DATE OF BIRTH:  1956-04-28   DATE OF ADMISSION:  09/04/2006  DATE OF DISCHARGE:  09/07/2006                               DISCHARGE SUMMARY   PRIMARY CARE PHYSICIAN:  Gentry Fitz - the patient is [facility] to [facility].   CARDIOLOGIST:  Bevelyn Buckles. Bensimhon, M.D. with [facility].   DISCHARGE DIAGNOSES:  1. Unstable angina pectoris.      a.     Status post cardiac catheterization revealing two vessel       coronary artery disease.      b.     Successful two vessel percutaneous coronary intervention       using bare metal stents on September 05, 2006.      c.     Aspirin indefinitely.      d.     Plavix for a minimum of 30 days and preferably for 1 year.      e.     Risk factor modification initiated.      f.     85% stenosis right coronary artery and 90% stenosis       circumflex.  2. Hyperlipidemia - treatment initiated with normal liver function      tests.  3. Hypertension.  4. Depression.  5. 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  6. Obesity.  7. Tobacco abuse - reports abstinence from this point forward.  8. Migraine headaches.   DISCHARGE MEDICATIONS:  1. Aspirin 325 mg daily.  2. Prozac 40 mg daily.  3. Prilosec OTC 20 mg daily.  4. Neurontin 300 mg b.i.d.  5. Simvastatin 80 mg q.h.s.  6. Plavix 75 mg daily.  7. Lopressor 25 mg b.i.d.  8. Lotensin 40 mg daily.  9. Hydrochlorothiazide 12.5 mg daily.  10.Nitrostat 0.4 mg sublingual use as directed.   FOLLOWUP:  1. The patient is to follow up with Bevelyn Buckles. Bensimhon, M.D. on September 21, 2006, at 3 o'clock p.m. for recheck.  2. The patient is referred to [facility] for ongoing      medical care.  A basic metabolic panel should be obtained at  follow-      up to make sure the patient is tolerating her combined ACE      inhibitor and diuretic therapy.   PROCEDURE:  Cardiac catheterization with placement of two bare metal  stents on September 05, 2006.   CONSULTATIONS:  [facility].   HOSPITAL COURSE:  Ms. Hiroko Tregre is a 55 year old female who  suffers with hypertension, is morbidly obese, and has an extensive  history of smoking.  She presented to the hospital on September 04, 2006,  with complaints of worsening chest pain.  She then previously evaluated  for such without significant findings.  The quality and character of her  pain, however, was very concerning for angina and  therefore she was  readmitted to the acute unit.  She ruled out initially for acute MI.  Given the nature of her risk factors as well as her history, however,  the decision was made to proceed with catheterization.  [facility] was consulted.  Cardiac catheterization was carried out on  September 05, 2006.  85% stenosis of the RCA and 90% stenosis of the  circumflex were appreciated.  These were treated with PCI using bare  metal stents.  The patient tolerated the procedure well.  In the  postoperative period, the patient did have some elevation of her cardiac  enzymes, mainly troponin.  Troponin reached a peak of 2.26.  Cardiac  enzymes then, however, began to wane.  The patient's symptoms improved.  She was felt to be stable for discharge.   Risk factor modification included evaluation of fasting lipid panel.  This was found to be markedly elevated with an LDL of 166.  Simvastatin  was therefore initiated.  Hemoglobin A1C was obtained and there was no  evidence of significant diabetes.  Tobacco abuse cessation was counseled  and the patient advised the team that she had quit smoking altogether.  Blood pressure medications were titrated and the patient tolerated this  without difficulty.  At the time of discharge, the patient's blood   pressure was 113/67 and she is tolerating her ACE inhibitor, diuretic,  and beta blocker without untoward effects.   On September 07, 2006, the patient was cleared for discharge from the  hospital.  Vital signs were stable, she was afebrile, and there was no  chest pain.  Follow-up issues are as discussed above.      Lonia Blood, M.D.  Electronically Signed     JTM/MEDQ  D:  09/07/2006  T:  09/07/2006  Job:  [id number]   cc:   Bevelyn Buckles. Bensimhon, MD  [facility]

## 2010-10-30 NOTE — Op Note (Signed)
[name]                  ACCOUNT NO.:  [id number]   MEDICAL RECORD NO.:  [id number]          PATIENT TYPE:  AMB   LOCATION:  DSC                          FACILITY:  [facility]   PHYSICIAN:  Cindee Salt, M.D.       DATE OF BIRTH:  March 25, 1956   DATE OF PROCEDURE:  DATE OF DISCHARGE:                                 OPERATIVE REPORT   PREOPERATIVE DIAGNOSIS:  Carpal tunnel syndrome, right hand.   POSTOPERATIVE DIAGNOSIS:  Carpal tunnel syndrome, right hand.   OPERATION:  Decompression right median nerve.   SURGEON:  Cindee Salt, M.D.   ASSISTANT:  __________   ANESTHESIA:  General.   DATE OF OPERATION:  May 03, 2006.   HISTORY:  The patient is a 55 year old female with a history of carpal  tunnel syndrome, EMG nerve conductions positive, which has not responded to  conservative treatment.  She has elected to proceed with surgical  decompression.  She is aware of risks and complications including infection;  recurrence; injury to arteries, nerves, tendons; incomplete relief of  symptoms; dystrophy.  She is seen in the preoperative area where the  extremity is marked.  Questions encouraged and answered.   OPERATIVE PROCEDURE:  The patient was brought to the operating room where a  general anesthetic was carried out without difficulty.  She was prepped  using DuraPrep in the supine position, right arm free.  A longitudinal  incision was made in the palm, carried down through subcutaneous tissue.  Bleeders were electrocauterized.  Palmar fascia was split, superficial  palmar arch identified.  The flexor tendon to the ring level finger  identified to the ulnar side and median nerve.  The carpal retinaculum was  incised with sharp dissection.  A right angle and __________ retractor were  placed between skin and forearm fascia.  The fascia was released for  approximately a centimeter and a half proximal to the wrist crease under  direct vision.  The canal was explored.  An area  of compression of the nerve  was immediately apparent.  No further lesions were identified.  Tenosynovial  tissue was moderately thickened.  The wound was irrigated.  The skin was  closed with interrupted 5-0 nylon sutures.  Sterile compressive dressing was  applied.  The patient tolerated the procedure well and was taken to the  recovery room for observation in satisfactory condition.  She is discharged  home to return to the [facility] in [place] in 1 week on Vicodin.           ______________________________  Cindee Salt, M.D.     GK/MEDQ  D:  05/03/2006  T:  05/04/2006  Job:  [id number]

## 2010-10-30 NOTE — Assessment & Plan Note (Signed)
[facility]                            CARDIOLOGY OFFICE NOTE   NAME:Kara Lamb, Kara Lamb                      MRN:          [id number]  DATE:09/14/2006                            DOB:          05-18-1956    PRIMARY CARE PHYSICIAN:  Bevelyn Buckles. Bensimhon, MD   HISTORY OF PRESENT ILLNESS:  This is a 55 year old white female patient  who had a prior diagnosis of nonobstructive coronary artery disease who  presented with an episode of chest pain consistent with unstable angina.  She underwent cardiac catheterization by Dr. Samule Ohm and underwent  successful bare metal stenting of 85% proximal RCA and 90% circumflex  lesion.  She had a residual 80% diagonal off the LAD that was under 2 mm  in diameter, and was treated medically.  Her enzymes did go up after her  cardiac catheterization, and she was kept for an extra day because of  this in the hospital.   Since the patient has been home, she has had multiple symptoms of chest  pain.  She said she has chest tightness, chest aching.  She gets short  of breath.  She gets out of breath just talking in the middle of the  conversation.  She becomes short of breath.  She says she has not been  able to get on good exercise program because she gets out of breath with  walking.  She also has a great deal of anxiety that may be playing a  role in all of this, which she admits to, and she is not sure how much  is anxiety.  Some of these chest pains are sharp shooting pains, and  occasionally, she will get a pressure.  She did use one nitroglycerin  with relief.  I talked in great detail with her and her partner, and  feel the majority of her symptoms is probably anxiety related.  She has  no job or income to speak of or insurance, so it is trying to get into  [facility], apply for Medicaid and be approved for cardiac  rehabilitation.  She is a bit stressed about the recent bills from the  hospital that she has received  as well.   CURRENT MEDICATIONS:  1. Aspirin 325 mg daily.  2. Prozac 40 mg b.i.d.  3. Prilosec 20 mg daily.  4. Neurontin 300 mg b.i.d.  5. Simvastatin 80 mg q.h.s.  6. Plavix 75 mg daily.  7. Lopressor 25 mg b.i.d.  8. Lotensin 40 mg daily.  9. Hydrochlorothiazide 12.5 mg daily.  10.Lunesta 40 mg daily.   PHYSICAL EXAMINATION:  GENERAL:  This is an anxious 55 year old white  female in no acute distress.  VITAL SIGNS:  Blood pressure 115/72, pulse 59, weight 212.  NECK:  Without JVD, HDR, bruit or thyroid enlargement.  LUNGS:  Clear anterior and posterolateral.  HEART:  Regular rate and rhythm at 50 beats per minute.  Normal S1, S2.  No murmur, rub, bruits, thrill or heave noted.  ABDOMEN:  Soft without organomegaly, masses, lesions or abnormal  tenderness.  Right groin is without hematoma or  hemorrhages.  Good  distal pulses.  EXTREMITIES:  Without cyanosis, clubbing or edema.  She has good distal  pulses.   STUDIES:  EKG sinus bradycardia, nonspecific T wave abnormality, no  acute change.   IMPRESSION:  1. Recurrent chest pain, shortness of breath, multiple symptoms after      cardiac catheterization, feel mostly anxiety related.  2. Status post bare metal stenting to the RCA and circumflex on      09/05/2006, with slight __________ enzymes after her stenting.      Normal LV function, ejection fraction 60%.  3. Anxiety/depression.  4. Hyperlipidemia.  5. Hypertension.  6. 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  7. Obesity.  8. Tobacco abuse, quit in January.  9. History of migraine headaches.   PLAN:  At this time, I have given the patient a prescription for Imdur  to try to see if this would alleviate her chest pain.  She has to get  her $4 prescriptions at [facility].  She has applied for Plavix assistance  through their program and has not heard back so we did give her some  samples of Plavix to hold her over.  She already has an appointment  to  see Dr. Gala Romney next week, which I have told her to keep since she is  having so many symptoms and to see if the Imdur does not help.  I have  encouraged her to obtain a primary by going through [facility] in  order to get a better hold on her anxiety and encouraged cardiac rehab  if in fact she can do this without insurance through hospital  assistance.      Jacolyn Reedy, PA-C  Electronically Signed      Noralyn Pick. Eden Emms, MD, [facility]  Electronically Signed   ML/MedQ  DD: 09/14/2006  DT: 09/14/2006  Job #: [id number]   cc:   Bevelyn Buckles. Bensimhon, MD  Noralyn Pick. Eden Emms, MD, [facility]

## 2010-10-30 NOTE — Consult Note (Signed)
[name]                  ACCOUNT NO.:  [id number]   MEDICAL RECORD NO.:  [id number]          PATIENT TYPE:  INP   LOCATION:  1843                         FACILITY[facility]  [facility]   PHYSICIAN:  Kara Lamb, MDDATE OF BIRTH:  [birth year]   DATE OF CONSULTATION:  09/04/2006  DATE OF DISCHARGE:                                 CONSULTATION   REFERRING PHYSICIAN:  Madaline Lamb, M.D.   PRIMARY CARE PHYSICIAN:  Dr. Earlene Lamb from Urgent Care.   CARDIOLOGIST:  Kara Lamb is unassigned.   HISTORY OF PRESENT ILLNESS:  Kara Lamb is a 55 year old woman with  multiple medical problems including hypertension, obesity, tobacco use,  reflex sympathetic dystrophy of the right upper extremity secondary to a  [employer] injury and irritable bowel syndrome.  Kara Lamb has a history  of noncardiac chest pain.  Kara Lamb is status post two previous cardiac  catheterizations, one in 2003 in [place] and one in 2006 in [place].  These showed a moderate nonobstructive coronary artery disease.  Catheterization in [facility] July of 2006 was reviewed, it showed an EF of  60%.  Left main was normal.  The LAD showed mild plaquing.  Left  circumflex  had a 40-50% lesion in the mid section and a 40-50% lesion  in the OM.  A right coronary was a large dominant vessel at 30%  scattered stenosis.   Kara Lamb reports a 2-3 week history of increasing chest pain at rest.  Also  states that her exercise tolerance has diminished and Kara Lamb now notes  sweat and nausea with any exertion.  Over the past 48 hours, Kara Lamb has had  constant, severe chest pain and there was some pain while walking her  dog today so her boyfriend insisted Kara Lamb come to the ER.  Kara Lamb was  reluctant to do so as Kara Lamb is uninsured.  The ER EKG was normal.  Troponin-I was less than 0.05. Kara Lamb continues to have 2-3/10 chest pain  despite several doses of Dilaudid.   REVIEW OF SYSTEMS:  Kara Lamb denies any bright red blood per rectum or  melena.  No fevers or chills.  No vomiting, no  diarrhea.  Kara Lamb does have  arthritis pain.  No focal neurologic symptoms.  No claudication.  No  heart failure.  The remainder of the review of systems is negative  except for HPI and problem list.   PROBLEM LIST:  1. History of noncardiac chest pain.  Previous cardiac catheterization      in 2003 and 2006 with mild to moderate nonobstructive coronary      artery disease.  2. Hypertension.  3. Obesity.  4. Tobacco use.  Quit January 2008.  5. History of alcohol abuse, now quit.  6. 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  7. Gastroesophageal reflux disease.  8. Anxiety/depression   CURRENT MEDICATIONS:  1. Aspirin 81 mg daily.  2. Benicar/HCTZ 40/12.5 mg daily.  3. Paxil 40 mg b.i.d.  4. Prilosec 20 mg a day.  5. Neurontin 300  mg b.i.d.   ALLERGIES/MEDICATION INTOLERANCE:  Kara Lamb has no allergies.  Kara Lamb has  previously been intolerant of Plavix due to constipation.   SOCIAL HISTORY:  Kara Lamb is single.  Kara Lamb has three children.  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  Kara Lamb has history of tobacco one pack per  day for many years.  Quit January 2008.  Also has history of heavy  alcohol abuse.  Now quit.  Denies drug use.   FAMILY HISTORY:  Mother has history of coronary artery disease and  status post a recent stent.  Father died of lung cancer in his 13s. Also  history of CHF.   PHYSICAL EXAMINATION:  GENERAL:  Kara Lamb is in no acute distress despite  complaining of 2-3/10 chest pain.  VITAL SIGNS: Blood pressure 117/66, pulse 70, temperature 98.2.  Kara Lamb is  96% o room air.  HEENT:  Sclerae anicteric.  EOMI.  No xanthelasma.  Mucous membranes are  moist.  Oropharynx clear.  NECK:  Supple.  There is no JVD.  Carotids are 2+ bilaterally and no  bruits.  No lymphadenopathy or thyromegaly.  CARDIAC:  Regular rate and  rhythm.  No murmurs, rubs or gallops.  LUNGS:  Clear.  ABDOMEN:  Obese.  Nondistended.  No obvious hepatosplenomegaly.  No  bruits.  No masses.  Good bowel sounds.  EXTREMITIES:  Warm with no cyanosis, clubbing or edema.  PT pulses are  1+ bilaterally.  There are no rashes.  NEUROLOGIC:  Alert and oriented x3.  Affect is mildly pressured.  Cranial nerves II-XII grossly intact.  Kara Lamb moves all four extremities  without difficulty.   LABORATORY DATA:  White count 10.3, hemoglobin 13.2, platelets 284,000.  Sodium 136, potassium 3.6, chloride 104, bicarb 27, BUN 15, creatinine  0.7, glucose 100.  Troponin is less than 0.05. MB is 1.2 op note point-  of-care markers.  Chest x-ray shows a normal mediastinum with mild  bronchitic changes.  Nothing acute.  EKG shows sinus rhythm in the 70s  with no ST-T wave changes.   ASSESSMENT/PLAN:  1. Kara Lamb's chest pain has both typical and atypical features.      Her prolonged episode of chest pain with negative enzymes and      normal EKG makes acute ischemia much less likely.  However, given      risk factors and progressive nature of her symptoms, we will plan      cardiac catheterization for definitive evaluation.  If her      catheterization is negative, we can consider a chest CT scan to      rule out dissection but I think the possibility of this is quite      low.  We will continue to rule her out overnight and treat her with      beta blocker aspirin and nitroglycerin and IV heparin pending her      catheterization tomorrow.      Kara Buckles. Bensimhon, MD  Electronically Signed     DRB/MEDQ  D:  09/04/2006  T:  09/05/2006  Job:  [id number]

## 2010-11-11 ENCOUNTER — Telehealth: Payer: Self-pay | Admitting: Internal Medicine

## 2010-11-11 NOTE — Telephone Encounter (Signed)
PT NEEDS LIPITOR TO BE CALL IN TO  [facility]. Pt would like to know when order is placed in to [facility].

## 2010-11-12 NOTE — Telephone Encounter (Signed)
Lipitor ordered from [facility], pt is aware, will call her back when med arrives

## 2010-11-18 ENCOUNTER — Other Ambulatory Visit: Payer: Self-pay | Admitting: *Deleted

## 2010-11-18 NOTE — Telephone Encounter (Signed)
Lipitor has arrived. Lipitor placed at the front desk for pick up. Message left to patient. PO # [phone]. [facility] # [phone]. Vikki Ports

## 2010-11-19 ENCOUNTER — Emergency Department (HOSPITAL_COMMUNITY)
Admission: EM | Admit: 2010-11-19 | Discharge: 2010-11-20 | Disposition: A | Discharge status: Other Acute Inpatient Hospital | Payer: Medicare Other | Source: Home / Self Care | Attending: Emergency Medicine | Admitting: Emergency Medicine

## 2010-11-19 DIAGNOSIS — F329 Major depressive disorder, single episode, unspecified: Secondary | ICD-10-CM | POA: Insufficient documentation

## 2010-11-19 DIAGNOSIS — R45851 Suicidal ideations: Secondary | ICD-10-CM | POA: Insufficient documentation

## 2010-11-19 DIAGNOSIS — E785 Hyperlipidemia, unspecified: Secondary | ICD-10-CM | POA: Insufficient documentation

## 2010-11-19 DIAGNOSIS — I10 Essential (primary) hypertension: Secondary | ICD-10-CM | POA: Insufficient documentation

## 2010-11-19 DIAGNOSIS — F3289 Other specified depressive episodes: Secondary | ICD-10-CM | POA: Insufficient documentation

## 2010-11-19 DIAGNOSIS — I2510 Atherosclerotic heart disease of native coronary artery without angina pectoris: Secondary | ICD-10-CM | POA: Insufficient documentation

## 2010-11-19 DIAGNOSIS — K219 Gastro-esophageal reflux disease without esophagitis: Secondary | ICD-10-CM | POA: Insufficient documentation

## 2010-11-19 LAB — CBC
HCT: 40.4 % (ref 36.0–46.0)
MCH: 32.5 pg (ref 26.0–34.0)
MCHC: 33.2 g/dL (ref 30.0–36.0)
RDW: 12 % (ref 11.5–15.5)

## 2010-11-19 LAB — RAPID URINE DRUG SCREEN, HOSP PERFORMED
Amphetamines: NOT DETECTED
Opiates: NOT DETECTED
Tetrahydrocannabinol: NOT DETECTED

## 2010-11-19 LAB — COMPREHENSIVE METABOLIC PANEL
ALT: 25 U/L (ref 0–35)
AST: 27 U/L (ref 0–37)
Albumin: 4.1 g/dL (ref 3.5–5.2)
Alkaline Phosphatase: 84 U/L (ref 39–117)
BUN: 11 mg/dL (ref 6–23)
Chloride: 105 mEq/L (ref 96–112)
Potassium: 3.5 mEq/L (ref 3.5–5.1)
Sodium: 139 mEq/L (ref 135–145)
Total Bilirubin: 0.2 mg/dL — ABNORMAL LOW (ref 0.3–1.2)
Total Protein: 6.9 g/dL (ref 6.0–8.3)

## 2010-11-19 LAB — DIFFERENTIAL
Basophils Absolute: 0 10*3/uL (ref 0.0–0.1)
Basophils Relative: 1 % (ref 0–1)
Eosinophils Relative: 3 % (ref 0–5)
Lymphocytes Relative: 32 % (ref 12–46)
Monocytes Absolute: 0.4 10*3/uL (ref 0.1–1.0)
Monocytes Relative: 7 % (ref 3–12)

## 2010-11-20 ENCOUNTER — Inpatient Hospital Stay (HOSPITAL_COMMUNITY)
Admission: AD | Admit: 2010-11-20 | Discharge: 2010-11-28 | DRG: 885 | Disposition: A | Discharge status: Home / Self Care | Payer: Medicare Other | Source: Ambulatory Visit | Attending: Psychiatry | Admitting: Psychiatry

## 2010-11-20 DIAGNOSIS — IMO0002 Reserved for concepts with insufficient information to code with codable children: Secondary | ICD-10-CM

## 2010-11-20 DIAGNOSIS — F411 Generalized anxiety disorder: Secondary | ICD-10-CM

## 2010-11-20 DIAGNOSIS — R45851 Suicidal ideations: Secondary | ICD-10-CM

## 2010-11-20 DIAGNOSIS — E7800 Pure hypercholesterolemia, unspecified: Secondary | ICD-10-CM

## 2010-11-20 DIAGNOSIS — F4310 Post-traumatic stress disorder, unspecified: Secondary | ICD-10-CM

## 2010-11-20 DIAGNOSIS — F321 Major depressive disorder, single episode, moderate: Principal | ICD-10-CM

## 2010-11-20 DIAGNOSIS — F39 Unspecified mood [affective] disorder: Secondary | ICD-10-CM

## 2010-11-20 DIAGNOSIS — I10 Essential (primary) hypertension: Secondary | ICD-10-CM

## 2010-11-20 DIAGNOSIS — K219 Gastro-esophageal reflux disease without esophagitis: Secondary | ICD-10-CM

## 2010-11-20 DIAGNOSIS — I2510 Atherosclerotic heart disease of native coronary artery without angina pectoris: Secondary | ICD-10-CM

## 2010-11-20 DIAGNOSIS — I252 Old myocardial infarction: Secondary | ICD-10-CM

## 2010-11-21 DIAGNOSIS — F329 Major depressive disorder, single episode, unspecified: Secondary | ICD-10-CM
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
## 2010-11-30 ENCOUNTER — Ambulatory Visit (INDEPENDENT_AMBULATORY_CARE_PROVIDER_SITE_OTHER): Payer: Medicare Other | Admitting: Critical Care Medicine

## 2010-11-30 ENCOUNTER — Encounter: Payer: Self-pay | Admitting: Critical Care Medicine

## 2010-11-30 DIAGNOSIS — F32A Depression, unspecified: Secondary | ICD-10-CM

## 2010-11-30 DIAGNOSIS — J449 Chronic obstructive pulmonary disease, unspecified: Secondary | ICD-10-CM

## 2010-11-30 DIAGNOSIS — F319 Bipolar disorder, unspecified: Secondary | ICD-10-CM

## 2010-11-30 DIAGNOSIS — F329 Major depressive disorder, single episode, unspecified: Secondary | ICD-10-CM

## 2010-11-30 DIAGNOSIS — F3289 Other specified depressive episodes: Secondary | ICD-10-CM

## 2010-11-30 NOTE — Assessment & Plan Note (Signed)
Golds Stage II Copd improved with smoking cessation Plan Use current supply of symbicort, and try to wean off and then focus just on prn SABA proair Return 6 months

## 2010-11-30 NOTE — Progress Notes (Signed)
Subjective:    Patient ID: Kara Lamb, female    DOB: 1956-03-26, 55 y.o.   MRN: [id number] 55 y.o.   WF with Golds Stage IICOPD     Shortness of Breath This is a chronic problem. The current episode started more than 1 year ago. The problem has been rapidly improving. Pertinent negatives include no abdominal pain, chest pain, claudication, fever, headaches, hemoptysis, leg pain, leg swelling, sore throat or wheezing. She has tried steroid inhalers and beta agonist inhalers for the symptoms. The treatment provided significant relief. Her past medical history is significant for COPD.   11/30/2010 See symptoms as above  Saw RB 09/01/10;  And Rx proair and cont symbicort.   CT Chest done 09/01/10 IMPRESSION:  Negative. No evidence of mass or active lung disease.  Just out of hospital for 12 days.  Hx of depression.  In counseling for 1.18yrs.  Hx of suidical attempts.  Once every 2.5 yrs.   Pt admitted for [facility] rx.  Issues with adult children.   Now on a better med  Now a hard cough for 2-3 times a day.   Heartburn is ok.  Sl hoarseness.  Throat is sl sore. Had a reaction to versed and fentanyl while EGD done.    Past Medical History  Diagnosis Date  . CAD (coronary artery disease)   . Chronic chest pain   . Anxiety and depression   . H/O: suicide attempt   . Hypertension   . Obesity   . Tobacco abuse   . H/O ETOH abuse   . Acid reflux   . Arthritis   . Hyperlipidemia   . Pneumonia   . UTI (lower urinary tract infection)   . Colitis      Family History  Problem Relation Age of Onset  . Coronary artery disease Mother   . Emphysema Mother   . Colon cancer Father   . Prostate cancer Father      History   Social History  . Marital Status: Single    Spouse Name: N/A    Number of Children: 3  . Years of Education: N/A   Occupational History  .      Workmen's comp   Social History Main Topics  . Smoking status: Former Smoker -- 1.0 packs/day for 35 years   Types: Cigarettes    Quit date: 02/23/2010  . Smokeless tobacco: Never Used  . Alcohol Use: Not on file  . Drug Use: Not on file  . Sexually Active: Not on file   Other Topics Concern  . Not on file   Social History Narrative  . No narrative on file     Allergies  Allergen Reactions  . Isosorbide Mononitrate      Outpatient Prescriptions Prior to Visit  Medication Sig Dispense Refill  . albuterol (PROAIR HFA) 108 (90 BASE) MCG/ACT inhaler Inhale 2 puffs into the lungs every 4 (four) hours as needed for wheezing (shortness of breath).  1 Inhaler  11  . ALPRAZolam (XANAX) 1 MG tablet 1 tab two to three times daily as needed      . aspirin 81 MG tablet Take 81 mg by mouth daily.        Marland Kitchen atorvastatin (LIPITOR) 80 MG tablet Take 1 tablet (80 mg total) by mouth daily.  30 tablet  6  . b complex vitamins tablet Take 1 tablet by mouth daily.        . budesonide-formoterol (SYMBICORT) 160-4.5 MCG/ACT inhaler Inhale  2 puffs into the lungs 2 (two) times daily.        . clopidogrel (PLAVIX) 75 MG tablet Take 75 mg by mouth daily.        Marland Kitchen esomeprazole (NEXIUM) 40 MG capsule Take 40 mg by mouth daily before breakfast.        . fish oil-omega-3 fatty acids 1000 MG capsule Take 1 g by mouth daily.        Marland Kitchen gabapentin (NEURONTIN) 400 MG capsule Take 400 mg by mouth at bedtime.        Marland Kitchen loratadine (CLARITIN) 10 MG tablet Take 10 mg by mouth daily.        . Multiple Vitamins-Minerals (MULTIVITAMIN WITH MINERALS) tablet Take 1 tablet by mouth daily.        . nitroGLYCERIN (NITROSTAT) 0.4 MG SL tablet Place 0.4 mg under the tongue every 5 (five) minutes as needed.        Marland Kitchen olmesartan (BENICAR) 5 MG tablet Take 2 tabs by mouth daily  60 tablet  6  . topiramate (TOPAMAX) 50 MG tablet 50 mg in am and 100 mg at bedtime      . citalopram (CELEXA) 40 MG tablet Take 40 mg by mouth daily.            Review of Systems  Constitutional: Negative for fever.  HENT: Negative for sore throat.     Respiratory: Positive for shortness of breath. Negative for hemoptysis and wheezing.   Cardiovascular: Negative for chest pain, claudication and leg swelling.  Gastrointestinal: Negative for abdominal pain.  Neurological: Negative for headaches.       Objective:   Physical Exam  Constitutional: She appears well-developed and well-nourished. No distress.       Overweight  HENT:  Head: Normocephalic and atraumatic.  Eyes: Pupils are equal, round, and reactive to light. Right eye exhibits no discharge. Left eye exhibits no discharge.  Neck: Normal range of motion. Neck supple. No JVD present. No tracheal deviation present. No thyromegaly present.  Cardiovascular: Normal rate, regular rhythm and normal heart sounds.  Exam reveals no gallop.   No murmur heard. Pulmonary/Chest: Effort normal and breath sounds normal. No stridor. No respiratory distress. She has no wheezes. She has no rales. She exhibits no tenderness.  Abdominal: Soft. There is no tenderness. There is no rebound and no guarding.  Musculoskeletal: Normal range of motion. She exhibits no edema.  Skin: Skin is warm and dry. She is not diaphoretic.          Assessment & Plan:   COPD Golds Stage II Copd improved with smoking cessation Plan Use current supply of symbicort, and try to wean off and then focus just on prn SABA proair Return 6 months     Updated Medication List Outpatient Encounter Prescriptions as of 11/30/2010  Medication Sig Dispense Refill  . albuterol (PROAIR HFA) 108 (90 BASE) MCG/ACT inhaler Inhale 2 puffs into the lungs every 4 (four) hours as needed for wheezing (shortness of breath).  1 Inhaler  11  . ALPRAZolam (XANAX) 1 MG tablet 1 tab two to three times daily as needed      . aspirin 81 MG tablet Take 81 mg by mouth daily.        Marland Kitchen atorvastatin (LIPITOR) 80 MG tablet Take 1 tablet (80 mg total) by mouth daily.  30 tablet  6  . b complex vitamins tablet Take 1 tablet by mouth daily.        Marland Kitchen  budesonide-formoterol (SYMBICORT) 160-4.5 MCG/ACT inhaler Inhale 2 puffs into the lungs 2 (two) times daily.        . clopidogrel (PLAVIX) 75 MG tablet Take 75 mg by mouth daily.        . DULoxetine (CYMBALTA) 30 MG capsule Take 30 mg by mouth daily. In the evening       . DULoxetine (CYMBALTA) 60 MG capsule Take 60 mg by mouth daily. In morning       . esomeprazole (NEXIUM) 40 MG capsule Take 40 mg by mouth daily before breakfast.        . fish oil-omega-3 fatty acids 1000 MG capsule Take 1 g by mouth daily.        Marland Kitchen gabapentin (NEURONTIN) 400 MG capsule Take 400 mg by mouth at bedtime.        Marland Kitchen loratadine (CLARITIN) 10 MG tablet Take 10 mg by mouth daily.        . Multiple Vitamins-Minerals (MULTIVITAMIN WITH MINERALS) tablet Take 1 tablet by mouth daily.        . nitroGLYCERIN (NITROSTAT) 0.4 MG SL tablet Place 0.4 mg under the tongue every 5 (five) minutes as needed.        Marland Kitchen olmesartan (BENICAR) 5 MG tablet Take 2 tabs by mouth daily  60 tablet  6  . topiramate (TOPAMAX) 50 MG tablet 50 mg in am and 100 mg at bedtime      . traZODone (DESYREL) 100 MG tablet Take 200 mg by mouth at bedtime.        Marland Kitchen DISCONTD: citalopram (CELEXA) 40 MG tablet Take 40 mg by mouth daily.

## 2010-11-30 NOTE — Patient Instructions (Signed)
No change in medications. Return in        6 months        

## 2010-12-01 ENCOUNTER — Other Ambulatory Visit: Payer: Self-pay | Admitting: Internal Medicine

## 2010-12-01 MED ORDER — ESOMEPRAZOLE MAGNESIUM 40 MG PO CPDR: 40.0000 mg | DELAYED_RELEASE_CAPSULE | Freq: Every day | ORAL | Status: DC

## 2010-12-01 NOTE — Telephone Encounter (Signed)
rx sent
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
## 2010-12-15 ENCOUNTER — Telehealth: Payer: Self-pay | Admitting: Internal Medicine

## 2010-12-15 ENCOUNTER — Ambulatory Visit: Payer: Medicare Other | Admitting: Internal Medicine

## 2010-12-15 NOTE — Telephone Encounter (Signed)
Pt cxl appt with bensimhom today, his schedule is full so she rs with scott, pt requesting lipid/liver prior and wants to know if that includes blood sugar if not wants to add, need order, pt wants to come in 7-12

## 2010-12-15 NOTE — Telephone Encounter (Signed)
Spoke w/pt she wants to see Dr Gala Romney, will call her next week w/appt for Aug

## 2010-12-24 ENCOUNTER — Encounter: Payer: Self-pay | Admitting: Physician Assistant

## 2010-12-31 ENCOUNTER — Ambulatory Visit: Payer: Medicare Other | Admitting: Physician Assistant

## 2011-01-12 ENCOUNTER — Telehealth: Payer: Self-pay | Admitting: Internal Medicine

## 2011-01-12 NOTE — Telephone Encounter (Signed)
Returning call back to [facility] regarding appt.

## 2011-01-12 NOTE — Telephone Encounter (Signed)
Pt aware Herbert Seta is working on appt. And will call her. Needs lab work 1 week before and a blood sugar test Medications reviewed. Abilify, cymbalta are new. Pt  Remarks she ha been having a "squeezing in my chest" at times but has not taken sl NTG.  Bp  145-150/70's-80's She is painfree at this time.

## 2011-01-20 ENCOUNTER — Telehealth: Payer: Self-pay | Admitting: Internal Medicine

## 2011-01-20 DIAGNOSIS — I2510 Atherosclerotic heart disease of native coronary artery without angina pectoris: Secondary | ICD-10-CM

## 2011-01-20 DIAGNOSIS — E7800 Pure hypercholesterolemia, unspecified: Secondary | ICD-10-CM

## 2011-01-20 NOTE — Telephone Encounter (Signed)
Pt c/o CP off/on continuously she is sch to see Lorin Picket on Mon 8/13 and will keep that appt, she is due for lab work and order has been placed and she will have them done on Fri she will see Lorin Picket and then probably get set up with another cardiologist as  Dr Gala Romney is not here very often

## 2011-01-20 NOTE — Telephone Encounter (Signed)
Pt would like to be set up for lab work. Pt  Has made appt to see scott weaver pa. Additional lab work.

## 2011-01-22 ENCOUNTER — Other Ambulatory Visit: Payer: Medicare Other | Admitting: *Deleted

## 2011-01-25 ENCOUNTER — Other Ambulatory Visit: Payer: Self-pay | Admitting: Obstetrics and Gynecology

## 2011-01-25 ENCOUNTER — Ambulatory Visit: Payer: Medicare Other | Admitting: Physician Assistant

## 2011-01-25 DIAGNOSIS — N63 Unspecified lump in unspecified breast: Secondary | ICD-10-CM

## 2011-01-25 DIAGNOSIS — R2230 Localized swelling, mass and lump, unspecified upper limb: Secondary | ICD-10-CM

## 2011-01-29 ENCOUNTER — Ambulatory Visit
Admission: RE | Admit: 2011-01-29 | Discharge: 2011-01-29 | Disposition: A | Discharge status: Home / Self Care | Payer: Medicare Other | Source: Ambulatory Visit | Attending: Obstetrics and Gynecology | Admitting: Obstetrics and Gynecology

## 2011-01-29 DIAGNOSIS — R2230 Localized swelling, mass and lump, unspecified upper limb: Secondary | ICD-10-CM

## 2011-01-29 DIAGNOSIS — N63 Unspecified lump in unspecified breast: Secondary | ICD-10-CM

## 2011-02-08 ENCOUNTER — Other Ambulatory Visit (INDEPENDENT_AMBULATORY_CARE_PROVIDER_SITE_OTHER): Payer: Medicare Other | Admitting: *Deleted

## 2011-02-08 DIAGNOSIS — E7800 Pure hypercholesterolemia, unspecified: Secondary | ICD-10-CM

## 2011-02-08 DIAGNOSIS — I2510 Atherosclerotic heart disease of native coronary artery without angina pectoris: Secondary | ICD-10-CM

## 2011-02-08 LAB — LIPID PANEL
HDL: 38 mg/dL — ABNORMAL LOW (ref >39.00)
LDL Cholesterol: 57 mg/dL (ref 0–99)
Total CHOL/HDL Ratio: 3
Triglycerides: 93 mg/dL (ref 0.0–149.0)
VLDL: 18.6 mg/dL (ref 0.0–40.0)

## 2011-02-08 LAB — BASIC METABOLIC PANEL
Calcium: 9.3 mg/dL (ref 8.4–10.5)
Creatinine, Ser: 0.7 mg/dL (ref 0.4–1.2)

## 2011-02-08 LAB — HEPATIC FUNCTION PANEL
Bilirubin, Direct: 0.1 mg/dL (ref 0.0–0.3)
Total Bilirubin: 0.6 mg/dL (ref 0.3–1.2)

## 2011-02-10 ENCOUNTER — Encounter: Payer: Self-pay | Admitting: Physician Assistant

## 2011-02-10 ENCOUNTER — Telehealth: Payer: Self-pay | Admitting: Physician Assistant

## 2011-02-10 ENCOUNTER — Ambulatory Visit (INDEPENDENT_AMBULATORY_CARE_PROVIDER_SITE_OTHER): Payer: Medicare Other | Admitting: Physician Assistant

## 2011-02-10 DIAGNOSIS — I2510 Atherosclerotic heart disease of native coronary artery without angina pectoris: Secondary | ICD-10-CM

## 2011-02-10 DIAGNOSIS — F17200 Nicotine dependence, unspecified, uncomplicated: Secondary | ICD-10-CM

## 2011-02-10 DIAGNOSIS — K219 Gastro-esophageal reflux disease without esophagitis: Secondary | ICD-10-CM

## 2011-02-10 DIAGNOSIS — I10 Essential (primary) hypertension: Secondary | ICD-10-CM

## 2011-02-10 DIAGNOSIS — R079 Chest pain, unspecified: Secondary | ICD-10-CM

## 2011-02-10 DIAGNOSIS — E785 Hyperlipidemia, unspecified: Secondary | ICD-10-CM

## 2011-02-10 DIAGNOSIS — F319 Bipolar disorder, unspecified: Secondary | ICD-10-CM

## 2011-02-10 MED ORDER — OLMESARTAN MEDOXOMIL 5 MG PO TABS: ORAL_TABLET | ORAL | Status: DC

## 2011-02-10 MED ORDER — ATORVASTATIN CALCIUM 80 MG PO TABS: 80.0000 mg | ORAL_TABLET | Freq: Every day | ORAL | Status: DC

## 2011-02-10 MED ORDER — CLOPIDOGREL BISULFATE 75 MG PO TABS: 75.0000 mg | ORAL_TABLET | Freq: Every day | ORAL | Status: DC

## 2011-02-10 NOTE — Patient Instructions (Addendum)
Your physician wants you to follow-up in: 6 months to see Dr. Clifton James. You will receive a reminder letter in the mail two months in advance. If you don't receive a letter, please call our office to schedule the follow-up appointment.  Your physician recommends that you return for lab work in: 6 MONTHS WHEN YOU COME IN AND SEE DR. MCALHANY YOU WILL NEED TO HAVE A FASTING LIVER/LIPID PANEL AT THAT TIME.  REFILLS HAVE BEEN SENT TO YOUR PHARMACY TODAY FOR LIPITOR, BENICAR, PLAVIX.  A COPY OF YOU LAST CATH REPORT AND LAST LABS HAVE BEEN GIVEN TO YOU TODAY AS WELL

## 2011-02-10 NOTE — Telephone Encounter (Signed)
Pt came in.

## 2011-02-10 NOTE — Assessment & Plan Note (Signed)
She has quit smoking.  I have congratulated her on this.

## 2011-02-10 NOTE — Progress Notes (Signed)
History of Present Illness: Primary Cardiologist:  Dr. Arvilla Meres   Kara Lamb is a 55 y.o. female with CAD and chronic chest pain. She's had previous stenting of her RCA and left circumflex complicated by in-stent restenosis of her RCA back in 2009.  At that time, she underwent drug-eluting stent to her RCA.  She also has a h/o HTN, HL, obesity, reflex sympathetic dystrophy and suicidal depression.  Last myoview was in 12/2009 and demonstrated an of EF 74% with no ischemia or infarct.  She was admitted 2/12 with chest pain and cath 07/16/10 demonstrated LAD 20-30%; CFX 20-30%, mCFX stent with 20-30% ISR; mRCA stent with 30-40% ISR, dRCA 30-40%; EF 55-60%.    I saw her in 2/12 after her cath.  She has seen Dr. Juanda Chance for GI follow up since.   An EGD did demonstrate Barrett's Esophagus.   She had a chest CT with Dr. Delton Coombes in 3/12 and this was normal.  She was admitted for depression to [facility] and 6/12.  She had followup lipids and LFTs 8/27: LFTs normal, TC 114, TG 93, HDL 38, LDL 57.  She returns today for routine followup.  Overall, she is doing well.  She feels much better since discharge from [facility].  She has occasional chest pains.  These are much less frequent than they were in February.  She's had 2 occasions where she's taken nitroglycerin.  She denies exertional chest pain.  She denies significant shortness of breath.  She denies syncope.  She is mainly limited by back pain.  She is currently seeing a neurosurgeon.  Past Medical History  Diagnosis Date  . CAD (coronary artery disease)     s/p BMS to RCA and LCX 2008. In-stent restenosis of RCA stent rx'd with Xience DES 12/09. Requires extensive sedation for cath. cath 11/10: LAD mild plaque. LCX 10% RCA 50% ISR (FFR 0.91/0.92) Distal RCA 40/50. Cath 2/12: LAD 20-30; CFX 20-30, stent with 20-30; RCA stent with 30-40 and dRCA 30-40; EF 55-60%  . Chronic chest pain   . Anxiety and depression   . H/O: suicide attempt      drug overdose 3/08  . Hypertension   . Obesity   . Tobacco abuse   . H/O ETOH abuse   . Acid reflux   . Arthritis   . Hyperlipidemia   . Pneumonia   . UTI (lower urinary tract infection)   . Colitis   . GERD (gastroesophageal reflux disease)   . COPD (chronic obstructive pulmonary disease)     Current Outpatient Prescriptions  Medication Sig Dispense Refill  . albuterol (PROAIR HFA) 108 (90 BASE) MCG/ACT inhaler Inhale 2 puffs into the lungs every 4 (four) hours as needed for wheezing (shortness of breath).  1 Inhaler  11  . ALPRAZolam (XANAX) 1 MG tablet 1 tab two to three times daily as needed      . ARIPiprazole (ABILIFY) 5 MG tablet Take 5 mg by mouth at bedtime.        Marland Kitchen aspirin 81 MG tablet Take 81 mg by mouth daily.        Marland Kitchen atorvastatin (LIPITOR) 80 MG tablet Take 1 tablet (80 mg total) by mouth daily.  90 tablet  11  . b complex vitamins tablet Take 1 tablet by mouth daily.        . clopidogrel (PLAVIX) 75 MG tablet Take 1 tablet (75 mg total) by mouth daily.  30 tablet  11  . DM-Doxylamine-Acetaminophen 15-6.25-325  MG/15ML LIQD Take by mouth as needed.        Marland Kitchen DM-Phenylephrine-Acetaminophen (VICKS DAYQUIL COLD & FLU) 10-5-325 MG/15ML LIQD Take by mouth as needed.        . DULoxetine (CYMBALTA) 30 MG capsule Take 30 mg by mouth 2 (two) times daily.       Marland Kitchen esomeprazole (NEXIUM) 40 MG capsule Take 1 capsule (40 mg total) by mouth daily before breakfast.  30 capsule  4  . fish oil-omega-3 fatty acids 1000 MG capsule Take 1 g by mouth daily.        Marland Kitchen gabapentin (NEURONTIN) 400 MG capsule Take 400 mg by mouth at bedtime.        Marland Kitchen loratadine (CLARITIN) 10 MG tablet Take 10 mg by mouth daily.        . Multiple Vitamins-Minerals (MULTIVITAMIN WITH MINERALS) tablet Take 1 tablet by mouth daily.        . nitroGLYCERIN (NITROSTAT) 0.4 MG SL tablet Place 0.4 mg under the tongue every 5 (five) minutes as needed.        Marland Kitchen olmesartan (BENICAR) 5 MG tablet Take 2 tabs by mouth daily   60 tablet  11  . Throat Lozenges (MENTHOL COUGH DROPS) 5 MG LOZG Use as directed in the mouth or throat as needed.        . topiramate (TOPAMAX) 50 MG tablet 100 mg 2 (two) times daily.       Marland Kitchen DISCONTD: atorvastatin (LIPITOR) 80 MG tablet Take 1 tablet (80 mg total) by mouth daily.  30 tablet  6  . DISCONTD: clopidogrel (PLAVIX) 75 MG tablet Take 75 mg by mouth daily.        Marland Kitchen DISCONTD: olmesartan (BENICAR) 5 MG tablet Take 2 tabs by mouth daily  60 tablet  6    Allergies: Allergies  Allergen Reactions  . Isosorbide Mononitrate     Social history:  Ex-smoker, she quit smoking several months ago.  Vital Signs: BP 108/78  Pulse 76  Ht 5' 1.5" (1.562 m)  Wt 180 lb 12.8 oz (82.01 kg)  BMI 33.61 kg/m2  PHYSICAL EXAM: Well nourished, well developed, in no acute distress HEENT: normal Neck: no JVD Cardiac:  normal S1, S2; RRR; no murmur Lungs:  clear to auscultation bilaterally, no wheezing, rhonchi or rales Abd: soft, nontender, no hepatomegaly Ext: no edema Skin: warm and dry Neuro:  CNs 2-12 intact, no focal abnormalities noted  EKG:  Sinus rhythm, heart rate 76, left axis deviation, PVC, nonspecific ST-T wave changes, poor R wave progression, no significant change  ASSESSMENT AND PLAN:

## 2011-02-10 NOTE — Assessment & Plan Note (Signed)
She has Barrett's esophagus.  Continue PPI and followup with gastroenterology.

## 2011-02-10 NOTE — Assessment & Plan Note (Signed)
Controlled.  

## 2011-02-10 NOTE — Assessment & Plan Note (Signed)
Lipids checked recently.  LDL is optimal.  I have recommended increasing activity to help with her HDL.  She would prefer to have her lipids and LFTs checked periodically.  We will arrange lipids and LFTs to be done at her followup visit in 6 months.

## 2011-02-10 NOTE — Assessment & Plan Note (Signed)
As noted, she feels much better since discharge from the hospital.

## 2011-02-10 NOTE — Assessment & Plan Note (Signed)
Stable.  She has chronic chest pain.  This is fairly unchanged.  Continue current medications.  Continue aspirin statin.  She plans to change to Dr. Clifton James for regular cardiac follow up.  Followup will be arranged in 6 months.

## 2011-02-17 ENCOUNTER — Encounter (HOSPITAL_COMMUNITY): Payer: Self-pay | Admitting: *Deleted

## 2011-02-24 ENCOUNTER — Emergency Department (HOSPITAL_COMMUNITY)
Admission: EM | Admit: 2011-02-24 | Discharge: 2011-02-25 | Disposition: A | Discharge status: Other Acute Inpatient Hospital | Payer: Medicare Other | Source: Home / Self Care | Attending: Emergency Medicine | Admitting: Emergency Medicine

## 2011-02-24 DIAGNOSIS — T50901A Poisoning by unspecified drugs, medicaments and biological substances, accidental (unintentional), initial encounter: Secondary | ICD-10-CM | POA: Insufficient documentation

## 2011-02-24 DIAGNOSIS — R4789 Other speech disturbances: Secondary | ICD-10-CM | POA: Insufficient documentation

## 2011-02-24 DIAGNOSIS — F329 Major depressive disorder, single episode, unspecified: Secondary | ICD-10-CM | POA: Insufficient documentation

## 2011-02-24 DIAGNOSIS — I252 Old myocardial infarction: Secondary | ICD-10-CM | POA: Insufficient documentation

## 2011-02-24 DIAGNOSIS — F3289 Other specified depressive episodes: Secondary | ICD-10-CM | POA: Insufficient documentation

## 2011-02-24 DIAGNOSIS — T50902A Poisoning by unspecified drugs, medicaments and biological substances, intentional self-harm, initial encounter: Secondary | ICD-10-CM | POA: Insufficient documentation

## 2011-02-24 DIAGNOSIS — R45851 Suicidal ideations: Secondary | ICD-10-CM | POA: Insufficient documentation

## 2011-02-24 DIAGNOSIS — I10 Essential (primary) hypertension: Secondary | ICD-10-CM | POA: Insufficient documentation

## 2011-02-24 DIAGNOSIS — I2510 Atherosclerotic heart disease of native coronary artery without angina pectoris: Secondary | ICD-10-CM | POA: Insufficient documentation

## 2011-02-24 LAB — COMPREHENSIVE METABOLIC PANEL
AST: 31 U/L (ref 0–37)
Albumin: 4.1 g/dL (ref 3.5–5.2)
Alkaline Phosphatase: 100 U/L (ref 39–117)
Chloride: 100 mEq/L (ref 96–112)
Potassium: 3.2 mEq/L — ABNORMAL LOW (ref 3.5–5.1)
Total Bilirubin: 0.5 mg/dL (ref 0.3–1.2)
Total Protein: 7.7 g/dL (ref 6.0–8.3)

## 2011-02-24 LAB — ETHANOL: Alcohol, Ethyl (B): <11 mg/dL (ref 0–11)

## 2011-02-24 LAB — URINALYSIS, ROUTINE W REFLEX MICROSCOPIC
Leukocytes, UA: NEGATIVE
Protein, ur: NEGATIVE mg/dL
Specific Gravity, Urine: 1.01 (ref 1.005–1.030)
Urobilinogen, UA: 1 mg/dL (ref 0.0–1.0)

## 2011-02-24 LAB — ACETAMINOPHEN LEVEL: Acetaminophen (Tylenol), Serum: <15 ug/mL (ref 10–30)

## 2011-02-24 LAB — RAPID URINE DRUG SCREEN, HOSP PERFORMED
Cocaine: NOT DETECTED
Opiates: NOT DETECTED

## 2011-02-24 LAB — POCT I-STAT TROPONIN I: Troponin i, poc: 0 ng/mL (ref 0.00–0.08)

## 2011-02-24 LAB — DIFFERENTIAL
Basophils Relative: 0 % (ref 0–1)
Eosinophils Absolute: 0.1 10*3/uL (ref 0.0–0.7)
Lymphs Abs: 2.6 10*3/uL (ref 0.7–4.0)
Monocytes Relative: 6 % (ref 3–12)
Neutro Abs: 5.5 10*3/uL (ref 1.7–7.7)
Neutrophils Relative %: 63 % (ref 43–77)

## 2011-02-24 LAB — CBC
Hemoglobin: 13.6 g/dL (ref 12.0–15.0)
MCV: 99 fL (ref 78.0–100.0)
Platelets: 257 10*3/uL (ref 150–400)
RBC: 4.09 MIL/uL (ref 3.87–5.11)
WBC: 8.8 10*3/uL (ref 4.0–10.5)

## 2011-02-25 ENCOUNTER — Inpatient Hospital Stay (HOSPITAL_COMMUNITY)
Admission: AD | Admit: 2011-02-25 | Discharge: 2011-03-04 | Disposition: A | Discharge status: Home / Self Care | Payer: Medicare Other | Source: Ambulatory Visit | Attending: Psychiatry | Admitting: Psychiatry

## 2011-02-25 DIAGNOSIS — Z7902 Long term (current) use of antithrombotics/antiplatelets: Secondary | ICD-10-CM

## 2011-02-25 DIAGNOSIS — T438X2A Poisoning by other psychotropic drugs, intentional self-harm, initial encounter: Secondary | ICD-10-CM

## 2011-02-25 DIAGNOSIS — R05 Cough: Secondary | ICD-10-CM

## 2011-02-25 DIAGNOSIS — F339 Major depressive disorder, recurrent, unspecified: Principal | ICD-10-CM

## 2011-02-25 DIAGNOSIS — E785 Hyperlipidemia, unspecified: Secondary | ICD-10-CM

## 2011-02-25 DIAGNOSIS — Z87891 Personal history of nicotine dependence: Secondary | ICD-10-CM

## 2011-02-25 DIAGNOSIS — I10 Essential (primary) hypertension: Secondary | ICD-10-CM

## 2011-02-25 DIAGNOSIS — T424X4A Poisoning by benzodiazepines, undetermined, initial encounter: Secondary | ICD-10-CM

## 2011-02-25 DIAGNOSIS — Z79899 Other long term (current) drug therapy: Secondary | ICD-10-CM

## 2011-02-25 DIAGNOSIS — I2510 Atherosclerotic heart disease of native coronary artery without angina pectoris: Secondary | ICD-10-CM

## 2011-02-25 DIAGNOSIS — K219 Gastro-esophageal reflux disease without esophagitis: Secondary | ICD-10-CM

## 2011-02-25 DIAGNOSIS — E7800 Pure hypercholesterolemia, unspecified: Secondary | ICD-10-CM

## 2011-02-25 DIAGNOSIS — F603 Borderline personality disorder: Secondary | ICD-10-CM

## 2011-02-25 DIAGNOSIS — Z7982 Long term (current) use of aspirin: Secondary | ICD-10-CM

## 2011-02-25 DIAGNOSIS — F4310 Post-traumatic stress disorder, unspecified: Secondary | ICD-10-CM

## 2011-02-25 DIAGNOSIS — T43502A Poisoning by unspecified antipsychotics and neuroleptics, intentional self-harm, initial encounter: Secondary | ICD-10-CM

## 2011-02-25 DIAGNOSIS — I252 Old myocardial infarction: Secondary | ICD-10-CM

## 2011-02-25 DIAGNOSIS — R059 Cough, unspecified: Secondary | ICD-10-CM

## 2011-02-26 DIAGNOSIS — F4310 Post-traumatic stress disorder, unspecified: Secondary | ICD-10-CM

## 2011-02-26 DIAGNOSIS — F339 Major depressive disorder, recurrent, unspecified: Secondary | ICD-10-CM

## 2011-03-03 ENCOUNTER — Ambulatory Visit (HOSPITAL_COMMUNITY)
Admit: 2011-03-03 | Discharge: 2011-03-03 | Disposition: A | Discharge status: Home / Self Care | Payer: Medicare Other | Attending: Psychiatry | Admitting: Psychiatry

## 2011-03-03 DIAGNOSIS — R079 Chest pain, unspecified: Secondary | ICD-10-CM

## 2011-03-04 ENCOUNTER — Observation Stay (HOSPITAL_COMMUNITY): Payer: Medicare Other

## 2011-03-04 ENCOUNTER — Inpatient Hospital Stay (HOSPITAL_COMMUNITY): Payer: Medicare Other

## 2011-03-04 ENCOUNTER — Inpatient Hospital Stay (HOSPITAL_COMMUNITY)
Admission: AD | Admit: 2011-03-04 | Discharge: 2011-03-12 | DRG: 885 | Disposition: A | Discharge status: Home / Self Care | Payer: Medicare Other | Source: Ambulatory Visit | Attending: Psychiatry | Admitting: Psychiatry

## 2011-03-04 ENCOUNTER — Observation Stay (HOSPITAL_COMMUNITY)
Admission: EM | Admit: 2011-03-04 | Discharge: 2011-03-04 | Disposition: A | Discharge status: Other Acute Inpatient Hospital | Payer: Medicare Other | Source: Other Acute Inpatient Hospital | Admitting: Internal Medicine

## 2011-03-04 DIAGNOSIS — J4489 Other specified chronic obstructive pulmonary disease: Secondary | ICD-10-CM | POA: Insufficient documentation

## 2011-03-04 DIAGNOSIS — Z9861 Coronary angioplasty status: Secondary | ICD-10-CM | POA: Insufficient documentation

## 2011-03-04 DIAGNOSIS — R079 Chest pain, unspecified: Secondary | ICD-10-CM | POA: Insufficient documentation

## 2011-03-04 DIAGNOSIS — F3289 Other specified depressive episodes: Secondary | ICD-10-CM | POA: Insufficient documentation

## 2011-03-04 DIAGNOSIS — E669 Obesity, unspecified: Secondary | ICD-10-CM | POA: Insufficient documentation

## 2011-03-04 DIAGNOSIS — K219 Gastro-esophageal reflux disease without esophagitis: Secondary | ICD-10-CM | POA: Insufficient documentation

## 2011-03-04 DIAGNOSIS — J449 Chronic obstructive pulmonary disease, unspecified: Secondary | ICD-10-CM | POA: Insufficient documentation

## 2011-03-04 DIAGNOSIS — F17200 Nicotine dependence, unspecified, uncomplicated: Secondary | ICD-10-CM | POA: Insufficient documentation

## 2011-03-04 DIAGNOSIS — F329 Major depressive disorder, single episode, unspecified: Secondary | ICD-10-CM | POA: Insufficient documentation

## 2011-03-04 DIAGNOSIS — I252 Old myocardial infarction: Secondary | ICD-10-CM | POA: Insufficient documentation

## 2011-03-04 DIAGNOSIS — I2510 Atherosclerotic heart disease of native coronary artery without angina pectoris: Secondary | ICD-10-CM | POA: Insufficient documentation

## 2011-03-04 LAB — LIPID PANEL
Cholesterol: 118 mg/dL (ref 0–200)
Total CHOL/HDL Ratio: 3.2 RATIO
Triglycerides: 94 mg/dL (ref <150)
VLDL: 19 mg/dL (ref 0–40)

## 2011-03-04 LAB — CBC
HCT: 33.8 % — ABNORMAL LOW (ref 36.0–46.0)
MCH: 33 pg (ref 26.0–34.0)
MCV: 98 fL (ref 78.0–100.0)
RDW: 12.2 % (ref 11.5–15.5)
WBC: 8 10*3/uL (ref 4.0–10.5)

## 2011-03-04 LAB — CK TOTAL AND CKMB (NOT AT ARMC)
CK, MB: 2.9 ng/mL (ref 0.3–4.0)
Relative Index: 1.3 (ref 0.0–2.5)

## 2011-03-04 LAB — POCT I-STAT, CHEM 8
BUN: 10 mg/dL (ref 6–23)
Chloride: 104 mEq/L (ref 96–112)
Creatinine, Ser: 0.8 mg/dL (ref 0.50–1.10)
Glucose, Bld: 125 mg/dL — ABNORMAL HIGH (ref 70–99)
HCT: 34 % — ABNORMAL LOW (ref 36.0–46.0)
Potassium: 3.4 mEq/L — ABNORMAL LOW (ref 3.5–5.1)

## 2011-03-04 LAB — DIFFERENTIAL
Eosinophils Relative: 3 % (ref 0–5)
Lymphocytes Relative: 37 % (ref 12–46)
Lymphs Abs: 2.9 10*3/uL (ref 0.7–4.0)
Monocytes Absolute: 0.6 10*3/uL (ref 0.1–1.0)
Monocytes Relative: 7 % (ref 3–12)

## 2011-03-04 LAB — BASIC METABOLIC PANEL
GFR calc Af Amer: >60 mL/min (ref >60)
GFR calc non Af Amer: >60 mL/min (ref >60)
Glucose, Bld: 111 mg/dL — ABNORMAL HIGH (ref 70–99)
Potassium: 3.3 mEq/L — ABNORMAL LOW (ref 3.5–5.1)
Sodium: 141 mEq/L (ref 135–145)

## 2011-03-04 LAB — CARDIAC PANEL(CRET KIN+CKTOT+MB+TROPI): CK, MB: 3.1 ng/mL (ref 0.3–4.0)

## 2011-03-04 LAB — PROTIME-INR: Prothrombin Time: 13.6 seconds (ref 11.6–15.2)

## 2011-03-04 MED ORDER — TECHNETIUM TC 99M TETROFOSMIN IV KIT
10.0000 | PACK | Freq: Once | INTRAVENOUS | Status: AC | PRN
  Administered 2011-03-04: 10 via INTRAVENOUS

## 2011-03-04 MED ORDER — TECHNETIUM TC 99M TETROFOSMIN IV KIT
30.0000 | PACK | Freq: Once | INTRAVENOUS | Status: AC | PRN
  Administered 2011-03-04: 30 via INTRAVENOUS

## 2011-03-08 LAB — COMPREHENSIVE METABOLIC PANEL
AST: 31
Albumin: 4
Alkaline Phosphatase: 55
BUN: 8
Chloride: 99
GFR calc Af Amer: >60
Potassium: 3.6
Total Protein: 7

## 2011-03-08 LAB — CBC
HCT: 38.8
Platelets: 248
RDW: 12
WBC: 11.3 — ABNORMAL HIGH

## 2011-03-08 LAB — ACETAMINOPHEN LEVEL
Acetaminophen (Tylenol), Serum: 12.3
Acetaminophen (Tylenol), Serum: 27.8

## 2011-03-08 LAB — ETHANOL: Alcohol, Ethyl (B): <5

## 2011-03-08 LAB — RAPID URINE DRUG SCREEN, HOSP PERFORMED
Amphetamines: NOT DETECTED
Benzodiazepines: NOT DETECTED
Tetrahydrocannabinol: NOT DETECTED
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
## 2011-03-10 NOTE — H&P (Signed)
NAMERhunette Croft NO.:  [id number]  MEDICAL RECORD NO.:  [id number]  LOCATION:  [facility]                         FACILITY:  [facility]  PHYSICIAN:  Harlon Flor, MD   DATE OF BIRTH:  07/23/1955  DATE OF ADMISSION:  03/04/2011 DATE OF DISCHARGE:                             HISTORY & PHYSICAL   PRIMARY CARDIOLOGIST:  Bevelyn Buckles. Bensimhon, MD  PULMONOLOGIST:  Charlcie Cradle. Delford Field, MD, FCCP  CHIEF COMPLAINT:  Chest pain.  HISTORY OF PRESENT ILLNESS:  [name] is a 55 year old white female with a history of coronary disease and major depression.  She is currently in Behavioral Health due to her major depression and suicidal ideation.  She lift her today and was brought to the ER due to chest pain that began at 6:00 p.m.  She describes it as a pressure in her chest that occurred at rest and spontaneously resolved a few hours later.  It was not related to exertion, did not radiate, was not associated with shortness of breath or nausea.  She is very somnolent during my exam and difficult to get much history from.  She is currently chest pain-free.  She has a history of coronary disease with a non-ST elevation MI in 2008, receiving a bare-metal stent to the circumflex and RCA.  This is complicated by in-stent restenosis diagnosed in 2009, treated with a drug-eluting stent.  She has been cath twice since then, most recently in July 2012 that showed patent stents.  The ED would like to bring her in for 24 observation to be sure she is not having anything going on.  PAST MEDICAL HISTORY: 1. Coronary artery disease:  Non-ST-segment elevation MI in 2008 with     bare-mental stents to RCA.  Cath again in 2009 with chest pain and     found to have 80% in-stent restenosis of the RCA, treated with drug-     eluting stent.  She is cath again in 2010 as well as February 2012,     showing nonobstructive disease and normal LV function.  In     addition, she had a nuclear stress  test in July 2011 showing normal     LV function and no scar or ischemia. 2. History of tobacco abuse. 3. Hyperlipidemia. 4. Obesity. 5. COPD. 6. Osteoarthritis. 7. Gastroesophageal reflux disease. 8. Migraines. 9. Depression, anxiety, and history of suicide attempts.  PAST SURGICAL HISTORY: 1. Right upper extremity surgery. 2. C section. 3. Bilateral tubal ligation. 4. Carpal tunnel surgery.  HOME MEDICATIONS: 1. Tylenol p.r.n. 2. Vitamin B complex daily. 3. Topiramate 100 mg b.i.d. 4. ProAir (albuterol) as needed. 5. Plavix 75 mg daily 6. Aspirin 81 mg daily. 7. Sublingual nitroglycerin p.r.n. 8. Nexium 40 mg daily. 9. Multivitamin daily. 10.Loratadine 10 mg daily. 11.Lipitor 80 mg at bedtime. 12.Gabapentin 400 mg at bedtime. 13.Fish oil 1000 mg t.i.d. 14.Cymbalta 30 mg b.i.d. 15.Benicar 5 mg daily. 16.Alprazolam 1 mg t.i.d. 17.Abilify 5 mg at bedtime.  ALLERGIES:  IMDUR.  SOCIAL HISTORY:  She lives in [place] and is currently unemployed. She has a long history of smoking, but quit recently.  She does not drink alcohol.  FAMILY HISTORY:  There is no history of early coronary disease.  REVIEW OF SYSTEMS:  A full review of systems is negative except as stated in HPI.  Of note, it is difficulty to get much history from her due to her somnolence.  PHYSICAL EXAM:  VITAL SIGNS:  Blood pressure 101/57, pulse 89, temperature 97.6, respiratory rate 18. GENERAL:  No acute distress. HEENT:  Extraocular movements intact.  Oropharynx benign.  Nonicteric sclera. NECK:  Supple. CARDIOVASCULAR:  Regular rhythm with normal S1 and S2.  No murmurs, rubs, or gallops. LUNGS:  Clear to auscultation bilaterally. ABDOMEN:  Soft, nontender, nondistended. EXTREMITIES:  No clubbing, cyanosis, or edema. NEURO:  She is somnolent, but awakes and answers questions well.  Moves all extremities well. SKIN:  No rashes. LYMPHADENOPATHY:  None.  Chest x-ray is clear.  EKG:  Normal  sinus rhythm.  No ST-T changes.  Hemoglobin 11, platelets 263, white count 8.  Basic metabolic panel is normal.  First troponin is negative.  ASSESSMENT/PLAN:  [name] is 55 year old white female female with coronary disease and previous in-stent restenosis who presents to the emergency room with chest pain.  She is currently admitted to Behavioral Health due to depression and suicidal ideation.  She is not able to give much history due to her somnolence now. She did have a recent left heart cath that showed nonobstructive disease.  We will bring her in and treat her with aspirin and her home medications and plan to rule her out for acute myocardial infarction. If she rules out suspect, she can probably go home without further workup.  Dr. Gala Romney discussed adding amlodipine to her regimen in the past, but she was resistant to this.  We will keep her with a sitter with suicide precautions until she gets back to [employer] tomorrow.     Harlon Flor, MD     MMB/MEDQ  D:  03/04/2011  T:  03/04/2011  Job:  [id number]  Electronically Signed by Meridee Score MD on 03/10/2011 05:43:52 AM

## 2011-03-16 ENCOUNTER — Telehealth: Payer: Self-pay | Admitting: Internal Medicine

## 2011-03-16 NOTE — Telephone Encounter (Signed)
Spoke w/pt she states at 4:30 this am she woke up b/c she had a bowel movement in the bed all over herself, she felt bad and checked her BP which was 77/57, now it is up to 88/52 standing and 80/57 sitting HR 77.  She states she has had no nausea, vomiting or diarrhea that would cause dehydration.  She states she feels woozy and out of it.  Upon further discussion she states she has been in Behavioral Health from 9/12 to 9/28 for OCD and meds have been adjusted.  She states she is now on Cymbalta 60 mg bid, Klonopin 1 mg bid, Seroquil 25 mg at am and noon and 50 mg in PM, Topamax 100 mg in AM and 150 mg in PM, Gabapentin 200 mg in AM and noon and 400 mg in PM and Trazadone 200 mg at bedtime.  She states these were all changed in hospital and she noticed there her BP would be in the 90s when they checked it, but she has not checked it since she has been home until today.  She states she saw DR Horald Pollen yesterday who wanted to back off on some meds but she did not want to as she feels they are helping her with the OCD and "rapid thinking"  Advised pt I feel this is just too much medication for her and that is why she feels so out of it advised her to call Dr Roxan Hockey back for med changes or appt, she is agreeable

## 2011-03-16 NOTE — Telephone Encounter (Signed)
Pt on 10 mg of benacar took 430am, bp low this am, 77/57 at 830a pulse 77, feeliong woozy, pls call

## 2011-03-16 NOTE — Telephone Encounter (Signed)
Left message to call back  

## 2011-03-17 ENCOUNTER — Emergency Department (HOSPITAL_COMMUNITY)
Admission: EM | Admit: 2011-03-17 | Discharge: 2011-03-22 | Disposition: A | Discharge status: Home / Self Care | Payer: Medicare Other | Attending: Emergency Medicine | Admitting: Emergency Medicine

## 2011-03-17 DIAGNOSIS — I2510 Atherosclerotic heart disease of native coronary artery without angina pectoris: Secondary | ICD-10-CM | POA: Insufficient documentation

## 2011-03-17 DIAGNOSIS — I10 Essential (primary) hypertension: Secondary | ICD-10-CM | POA: Insufficient documentation

## 2011-03-17 DIAGNOSIS — F329 Major depressive disorder, single episode, unspecified: Secondary | ICD-10-CM | POA: Insufficient documentation

## 2011-03-17 DIAGNOSIS — I252 Old myocardial infarction: Secondary | ICD-10-CM | POA: Insufficient documentation

## 2011-03-17 DIAGNOSIS — F3289 Other specified depressive episodes: Secondary | ICD-10-CM | POA: Insufficient documentation

## 2011-03-17 DIAGNOSIS — Z79899 Other long term (current) drug therapy: Secondary | ICD-10-CM | POA: Insufficient documentation

## 2011-03-17 LAB — DIFFERENTIAL
Eosinophils Absolute: 0.1 10*3/uL (ref 0.0–0.7)
Eosinophils Relative: 2 % (ref 0–5)
Lymphs Abs: 2.4 10*3/uL (ref 0.7–4.0)
Monocytes Relative: 8 % (ref 3–12)

## 2011-03-17 LAB — COMPREHENSIVE METABOLIC PANEL
AST: 26 U/L (ref 0–37)
Albumin: 3.5 g/dL (ref 3.5–5.2)
Calcium: 9.3 mg/dL (ref 8.4–10.5)
Creatinine, Ser: 0.7 mg/dL (ref 0.50–1.10)
GFR calc non Af Amer: >90 mL/min (ref >90)
Total Protein: 6.9 g/dL (ref 6.0–8.3)

## 2011-03-17 LAB — URINALYSIS, ROUTINE W REFLEX MICROSCOPIC
Bilirubin Urine: NEGATIVE
Glucose, UA: NEGATIVE mg/dL
Leukocytes, UA: NEGATIVE
Nitrite: NEGATIVE
Specific Gravity, Urine: 1.01 (ref 1.005–1.030)
pH: 7.5 (ref 5.0–8.0)

## 2011-03-17 LAB — RAPID URINE DRUG SCREEN, HOSP PERFORMED
Amphetamines: NOT DETECTED
Benzodiazepines: NOT DETECTED
Cocaine: NOT DETECTED

## 2011-03-17 LAB — CBC
MCH: 32.8 pg (ref 26.0–34.0)
MCHC: 32.6 g/dL (ref 30.0–36.0)
MCV: 100.6 fL — ABNORMAL HIGH (ref 78.0–100.0)
Platelets: 273 10*3/uL (ref 150–400)
RDW: 12.4 % (ref 11.5–15.5)

## 2011-03-17 NOTE — Discharge Summary (Addendum)
[name]                  ACCOUNT NO.:  [id number]  MEDICAL RECORD NO.:  [id number]  LOCATION:  0507                          FACILITY:  [facility]  PHYSICIAN:  Veverly Fells. Excell Seltzer, MD  DATE OF BIRTH:  05-28-1956  DATE OF ADMISSION:  03/04/2011 DATE OF DISCHARGE:                              DISCHARGE SUMMARY   PRIMARY CARDIOLOGIST:  Previously, Kara Buckles. Bensimhon, MD  DISCHARGE DIAGNOSIS:  Chest pain without objective evidence of ischemia.  SECONDARY DIAGNOSES: 1. Coronary artery disease, status post prior right circumflex artery     and circumflex stenting in 2008, with subsequent drug-eluting stent     placement in the right coronary artery in 2009, patent stent on     catheterization in February 2012. 2. Remote tobacco abuse. 3. Hyperlipidemia. 4. Obesity. 5. Chronic obstructive pulmonary disease. 6. Osteoarthritis. 7. Gastroesophageal reflux disease. 8. History of chronic chest pain. 9. Depression with suicidal ideation currently being managed at     behavior health. 10.History of migraine headaches. 11.History of carpal tunnel syndrome. 12.Status post cesarean section. 13.Status post tubal ligation. 14.Status post carpal tunnel surgery. 15.Hypokalemia requiring supplementation.  ALLERGIES:  IMDUR.  PROCEDURES:  Lexiscan Myoview revealing an EF of 60% without evidence of ischemia.  HISTORY OF PRESENT ILLNESS:  [name] recently admitted to Behavioral Health secondary to depression with suicidal ideation.  The patient reports that she was in her usual state of health until the night prior to admission when she laid down to her bed and had sudden onset of left-sided and epigastric chest discomfort without associated symptoms.  Symptoms persisted for a few hours and she was taken to the [facility] ED.  She was treated with nitroglycerin with minimal relief.  ECG was nonacute and enzymes were negative.  She was admitted for the evaluation.  HOSPITAL  COURSE:  The patient ruled out for MI despite prolonged symptoms.  After discussion, decision was made to pursue [facility] which was performed this morning showing an EF of 70% without any evidence of ischemia or scar.  With these results, we feel confident that the patient's chest pain is noncardiac in origin and planned to transfer her back to behavioral health for further psychologic management.  DISCHARGE LABORATORY DATA:  Hemoglobin 11.6, hematocrit 34.0, WBC 8.0 and platelets 263.  Sodium 141, potassium 3.3 (replaced prior to discharge, chloride 107, CO2 28, BUN 10, creatinine 0.66, glucose 111, calcium 8.8, CK 182, MB 2.7, troponin-I less than 0.30, total cholesterol 18, triglycerides 94, HDL 37 and LDL 62.  DISPOSITION:  The patient will be transferred back to behavioral health today in good condition.  FOLLOWUP PLANS AND APPOINTMENTS:  The patient will follow up with Kara Newcomer PA-C on April 13, 2011, at 10 a.m.  DISCHARGE MEDICATIONS: 1. Abilify 5 mg nightly. 2. Alprazolam 1 mg t.i.d. 3. Aspirin 81 mg daily. 4. Benicar 5 mg 2 tabs daily. 5. Cymbalta 20 mg b.i.d. 6. Fish oil 1000 mg t.i.d. 7. Gabapentin 400 mg nightly. 8. Lipitor 80 mg nightly. 9. Fluoxetine 10 mg daily. 10.Multivitamin 1 tab daily. 11.Nexium 40 mg daily. 12.Nitroglycerin 0.4 mg subcu p.r.n. chest pain. 13.Plavix 75 mg nightly. 14.ProAir inhaler  2 puffs q.4 h. p.r.n. 15.Topiramate 100 mg b.i.d. 16.Tylenol Extra Strength 2 tabs q.6 h. p.r.n. 17.Vitamin B complex daily.  OUTSTANDING LABORATORY DATA AND STUDIES:  None.  DURATION OF DISCHARGE ENCOUNTER:  45 minutes including physician time.     Kara Lamb, ANP   ______________________________ Veverly Fells. Excell Seltzer, MD    CB/MEDQ  D:  03/04/2011  T:  03/04/2011  Job:  [id number]  Electronically Signed by Kara Lamb ANP on 03/21/2011 08:17:25 AM Electronically Signed by Tonny Bollman MD on 04/04/2011 01:13:47 AM

## 2011-03-18 NOTE — Discharge Summary (Signed)
NAME:  Kara Lamb NO.:  [id number]  MEDICAL RECORD NO.:  [id number]  LOCATION:  0505                          FACILITY:  [facility]  PHYSICIAN:  Franchot Gallo, MD     DATE OF BIRTH:  09-10-1955  DATE OF ADMISSION:  02/25/2011 DATE OF DISCHARGE:  03/04/2011                              DISCHARGE SUMMARY   REASON FOR ADMISSION:  This is a 55 year old female that presented to the emergency room reporting depressive symptoms and over the course of the past 24 hours she had taken 10 extra Xanax  She was reporting increasing stress with her children and stating that she did not want to live.  FINAL IMPRESSION:  AXIS I:  Major depressive disorder recurrent, post traumatic stress disorder. AXIS II: Deferred. AXIS III:  Coronary artery disease, history of MI, GERD, hyperlipidemia, and hypertension. AXIS IV:  Limited primary support and chronic health issues. AXIS V:  On discharge is 65.  PERTINENT LABS ON ADMISSION:  Urine drug screen positive for benzodiazepines.  CBC no abnormalities.  Glucose was elevated 130. Urinalysis was negative.  SIGNIFICANT FINDINGS:  The patient was admitted to the adult milieu for safety and stabilization.  Reviewed her medications.  She initially was reporting problems with sleep, but a good appetite, severe depressive symptoms, rating an 8 on a scale of 1-10, having episodic suicidal thoughts, rating her hopelessness an 8 on a scale of 1-10.  We increased her Cymbalta at that time to 60 mg b.i.d. for depressive symptoms, increased her Topamax, and magnesium citrate available for history of constipation.  We also changed her Xanax to Klonopin.  The patient was attending groups.  We had contact with the patient's friend, Bonita Quin, for concerns to address any safety concerns and for [place] to provide information.  The patient was voicing her feelings of emptiness, not being on her children, which was a major stressor for her.  She was  reporting problems with sleep and having a good appetite, severe depressive symptoms, having episodic suicidal thinking, but no homicidal ideations or psychotic symptoms. She was complaining about the nurses and was feeling ashamed about her overdose.  She continued to report episodic suicidal thinking, but no plan or intent.  She was concerned that she had bronchitis and was sent for chest x-ray.  We had Seroquel available for anxiety and discontinued Abilify and added Seroquel for her OCD and mood stabilization.  We increased her Klonopin to help with her anxiety and ordered a Z-Pak for her bronchitis.  She continued to endorse depressive symptoms rating it a 6-7 on a scale of 1-10.  The patient was reporting symptoms of chest pain and was sent for further assessment and had a cardiac cath and was considered stable and was sent back to [facility] for further assessment of her depression and past overdose.  Her sleep was improving and her appetite was good.  She was having mild depressive symptoms rating a 6 on a scale of 1-10.  She was having few suicidal thinking. No homicidal ideation and rating her hopelessness a 3 on a scale of 1- 10.  She did have an episode of decreased sleep  that was due to her roommate, otherwise the patient was doing well.  We changed her Klonopin to 1 mg routinely at 8 and 2, and had a 1 mg available for anxiety.  We also increased her Neurontin for acute anxiety and continued to monitor her mood and behavior.  On day of discharge the patient was reporting good sleep,good appetite.  Her depression had resolved rating it a 1 on a scale of 1-10.  She adamantly denied any suicidal or homicidal thoughts.  Denied any auditory or visual hallucinations or delusional thinking, rating her anxiety mild to 2 on a scale of 1-10 and reporting no medication side effects.    Discharge medications included Symbicort 2 puffs b.i.d., Klonopin 1 mg at 8 and 2, Cymbalta 60  mg one at 6:00 a.m. and 2:00 p.m., gabapentin 100 mg taking two at 8 and 2, and 400 mg at bedtime, Cryptopine 50 mg tablet taking one-half tablet twice daily and 50 mg at bedtime, Topamax 100 mg, taking 100 mg in the morning and 150 mg at bedtime for migraines and mood stability and trazodone 100 mg taking two nightly, enteric-coated aspirin 81 mg daily, Benicar 5 mg two daily, fish oil 1000 mg t.i.d., Lipitor 80 mg one nightly, loratadine 10 mg one daily as needed, multivitamin one daily, Nexium 40 mg daily, nitroglycerin 0.4 mg every 5 minutes t.i.d. p.r.n. for chest pain, Plavix 75 mg daily, ProAir 2 puffs q.4 h as needed, vitamin B- Complex with C daily.  We had changed her doses of gabapentin and Topamax.  The patient was to stop taking her Xanax and Abilify.  Her follow-up appointment was with [facility] phone number [phone] on October 1 the patient was walk-in between the hours of 8 and 11.     Landry Corporal, N.P.   ______________________________ Franchot Gallo, MD    JO/MEDQ  D:  03/16/2011  T:  03/16/2011  Job:  [id number]  Electronically Signed by Limmie Patricia.P. on 03/17/2011 09:43:32 AM Electronically Signed by Franchot Gallo MD on 03/18/2011 08:25:23 AM

## 2011-03-19 LAB — COMPREHENSIVE METABOLIC PANEL
ALT: 22 U/L (ref 0–35)
AST: 27 U/L (ref 0–37)
CO2: 29 mEq/L (ref 19–32)
Chloride: 101 mEq/L (ref 96–112)
Creatinine, Ser: 0.67 mg/dL (ref 0.4–1.2)
GFR calc Af Amer: >60 mL/min (ref >60)
GFR calc non Af Amer: >60 mL/min (ref >60)
Total Bilirubin: 0.8 mg/dL (ref 0.3–1.2)

## 2011-03-19 LAB — CBC
HCT: 37.2 % (ref 36.0–46.0)
Hemoglobin: 13.4 g/dL (ref 12.0–15.0)
MCV: 102.5 fL — ABNORMAL HIGH (ref 78.0–100.0)
RBC: 3.63 MIL/uL — ABNORMAL LOW (ref 3.87–5.11)
RBC: 3.81 MIL/uL — ABNORMAL LOW (ref 3.87–5.11)
WBC: 11.8 10*3/uL — ABNORMAL HIGH (ref 4.0–10.5)
WBC: 7.7 10*3/uL (ref 4.0–10.5)

## 2011-03-19 LAB — BASIC METABOLIC PANEL
Calcium: 9.7 mg/dL (ref 8.4–10.5)
GFR calc Af Amer: >60 mL/min (ref >60)
GFR calc non Af Amer: >60 mL/min (ref >60)
Sodium: 138 mEq/L (ref 135–145)

## 2011-03-19 LAB — LIPID PANEL
HDL: 40 mg/dL (ref >39)
Total CHOL/HDL Ratio: 3.8 RATIO

## 2011-03-19 LAB — APTT: aPTT: 25 seconds (ref 24–37)

## 2011-04-06 LAB — HM PAP SMEAR: HM Pap smear: NORMAL

## 2011-04-13 ENCOUNTER — Encounter: Payer: Medicare Other | Admitting: Physician Assistant

## 2011-05-18 ENCOUNTER — Telehealth: Payer: Self-pay | Admitting: Cardiovascular Disease

## 2011-05-18 NOTE — Telephone Encounter (Signed)
Spoke with pt and told her she is not due for lab work at this time--CMET, CBC checked in October 2012 and Lipid profile checked in Sept. 2012.  Confirmed appointment time of 12:15 on May 24, 2011 with Dr. Clifton James with pt.   She reports she is having chest pain at times. Pain occurs at night while lying in bed.  None at present.  Pt instructed to go to ED to be evaluated if symptoms increase prior to appt with Dr. Clifton James.

## 2011-05-18 NOTE — Telephone Encounter (Signed)
New problem Pt wants to get labs done tomorrow please call her back

## 2011-05-19 ENCOUNTER — Ambulatory Visit: Payer: Medicare Other | Admitting: Critical Care Medicine

## 2011-05-19 ENCOUNTER — Ambulatory Visit (INDEPENDENT_AMBULATORY_CARE_PROVIDER_SITE_OTHER): Payer: Medicare Other | Admitting: Critical Care Medicine

## 2011-05-19 ENCOUNTER — Encounter: Payer: Self-pay | Admitting: Critical Care Medicine

## 2011-05-19 DIAGNOSIS — J209 Acute bronchitis, unspecified: Secondary | ICD-10-CM

## 2011-05-19 DIAGNOSIS — J449 Chronic obstructive pulmonary disease, unspecified: Secondary | ICD-10-CM

## 2011-05-19 MED ORDER — TIOTROPIUM BROMIDE MONOHYDRATE 18 MCG IN CAPS: 18.0000 ug | ORAL_CAPSULE | Freq: Every day | RESPIRATORY_TRACT | Status: DC

## 2011-05-19 MED ORDER — DOXYCYCLINE HYCLATE 100 MG PO CAPS: 100.0000 mg | ORAL_CAPSULE | Freq: Two times a day (BID) | ORAL | Status: AC

## 2011-05-19 NOTE — Patient Instructions (Signed)
Start Spiriva daily Use doxycycline one twice daily for 7days Proair as needed Return 4 months

## 2011-05-19 NOTE — Progress Notes (Addendum)
Subjective:    Patient ID: Kara Lamb, female    DOB: 06-04-1956, 55 y.o.   MRN: [id number] 55 y.o.   WF with Golds Stage IICOPD     Shortness of Breath This is a chronic problem. The current episode started more than 1 year ago. The problem has been rapidly improving. Pertinent negatives include no abdominal pain, chest pain, claudication, fever, headaches, hemoptysis, leg pain, leg swelling, sore throat or wheezing. She has tried steroid inhalers and beta agonist inhalers for the symptoms. The treatment provided significant relief. Her past medical history is significant for COPD.   6/18 See symptoms as above  Saw RB 09/01/10;  And Rx proair and cont symbicort.   CT Chest done 09/01/10 IMPRESSION:  Negative. No evidence of mass or active lung disease.  Just out of hospital for 12 days.  Hx of depression.  In counseling for 1.8yrs.  Hx of suidical attempts.  Once every 2.5 yrs.   Pt admitted for [facility] rx.  Issues with adult children.   Now on a better med  Now a hard cough for 2-3 times a day.   Heartburn is ok.  Sl hoarseness.  Throat is sl sore. Had a reaction to versed and fentanyl while EGD done.     05/20/2011 Sept and Oct back in [facility]. Doing better now and sees therapist once a week. Dyspnea worse over the fall .  Uses albuterol more, mucus in throat and is green.  No real nasal mucus.  Notes some wheeze.  Now off cigs for 1.70yrs. Sees John[facility] Ou [facility] Past Medical History  Diagnosis Date  . CAD (coronary artery disease)     s/p BMS to RCA and LCX 2008. In-stent restenosis of RCA stent rx'd with Xience DES 12/09. Requires extensive sedation for cath. cath 11/10: LAD mild plaque. LCX 10% RCA 50% ISR (FFR 0.91/0.92) Distal RCA 40/50. Cath 2/12: LAD 20-30; CFX 20-30, stent with 20-30; RCA stent with 30-40 and dRCA 30-40; EF 55-60%  . Chronic chest pain   . Anxiety and depression   . H/O: suicide attempt     drug overdose 3/08  . Hypertension   . Obesity     . Tobacco abuse   . H/O ETOH abuse   . Acid reflux   . Arthritis   . Hyperlipidemia   . Pneumonia   . UTI (lower urinary tract infection)   . Colitis   . GERD (gastroesophageal reflux disease)   . COPD (chronic obstructive pulmonary disease)      Family History  Problem Relation Age of Onset  . Coronary artery disease Mother   . Emphysema Mother   . Colon cancer Father   . Prostate cancer Father      History   Social History  . Marital Status: Divorced    Spouse Name: N/A    Number of Children: 3  . Years of Education: N/A   Occupational History  .      Workmen's comp   Social History Main Topics  . Smoking status: Former Smoker -- 1.0 packs/day for 35 years    Types: Cigarettes    Quit date: 02/23/2010  . Smokeless tobacco: Never Used  . Alcohol Use: Yes  . Drug Use: Not on file  . Sexually Active: Not on file   Other Topics Concern  . Not on file   Social History Narrative  . No narrative on file     Allergies  Allergen Reactions  .  Isosorbide Mononitrate      Outpatient Prescriptions Prior to Visit  Medication Sig Dispense Refill  . albuterol (PROAIR HFA) 108 (90 BASE) MCG/ACT inhaler Inhale 2 puffs into the lungs every 4 (four) hours as needed for wheezing (shortness of breath).  1 Inhaler  11  . aspirin 81 MG tablet Take 81 mg by mouth daily.        Marland Kitchen atorvastatin (LIPITOR) 80 MG tablet Take 1 tablet (80 mg total) by mouth daily.  90 tablet  11  . b complex vitamins tablet Take 1 tablet by mouth daily.        . clopidogrel (PLAVIX) 75 MG tablet Take 1 tablet (75 mg total) by mouth daily.  30 tablet  11  . esomeprazole (NEXIUM) 40 MG capsule Take 1 capsule (40 mg total) by mouth daily before breakfast.  30 capsule  4  . fish oil-omega-3 fatty acids 1000 MG capsule Take 1 g by mouth daily.        Marland Kitchen gabapentin (NEURONTIN) 400 MG capsule Take 400 mg by mouth at bedtime.        Marland Kitchen loratadine (CLARITIN) 10 MG tablet Take 10 mg by mouth daily.        .  Multiple Vitamins-Minerals (MULTIVITAMIN WITH MINERALS) tablet Take 1 tablet by mouth daily.        . nitroGLYCERIN (NITROSTAT) 0.4 MG SL tablet Place 0.4 mg under the tongue every 5 (five) minutes as needed.        Marland Kitchen olmesartan (BENICAR) 5 MG tablet Take 2 tabs by mouth daily  60 tablet  11  . Throat Lozenges (MENTHOL COUGH DROPS) 5 MG LOZG Use as directed in the mouth or throat as needed.        . ALPRAZolam (XANAX) 1 MG tablet 1 tab two to three times daily as needed      . ARIPiprazole (ABILIFY) 5 MG tablet Take 5 mg by mouth at bedtime.        Marland Kitchen DM-Doxylamine-Acetaminophen 15-6.25-325 MG/15ML LIQD Take by mouth as needed.        Marland Kitchen DM-Phenylephrine-Acetaminophen (VICKS DAYQUIL COLD & FLU) 10-5-325 MG/15ML LIQD Take by mouth as needed.        . DULoxetine (CYMBALTA) 30 MG capsule Take 30 mg by mouth 2 (two) times daily.       Marland Kitchen topiramate (TOPAMAX) 50 MG tablet 100 mg 2 (two) times daily.           Review of Systems  Constitutional: Negative for fever.  HENT: Negative for sore throat.   Respiratory: Positive for shortness of breath. Negative for hemoptysis and wheezing.   Cardiovascular: Negative for chest pain, claudication and leg swelling.  Gastrointestinal: Negative for abdominal pain.  Neurological: Negative for headaches.       Objective:   Physical Exam  Constitutional: She appears well-developed and well-nourished. No distress.       Overweight  HENT:  Head: Normocephalic and atraumatic.  Eyes: Pupils are equal, round, and reactive to light. Right eye exhibits no discharge. Left eye exhibits no discharge.  Neck: Normal range of motion. Neck supple. No JVD present. No tracheal deviation present. No thyromegaly present.  Cardiovascular: Normal rate, regular rhythm and normal heart sounds.  Exam reveals no gallop.   No murmur heard. Pulmonary/Chest: Effort normal and breath sounds normal. No stridor. No respiratory distress. She has no wheezes. She has no rales. She exhibits  no tenderness.  Abdominal: Soft. There is no tenderness. There is no  rebound and no guarding.  Musculoskeletal: Normal range of motion. She exhibits no edema.  Skin: Skin is warm and dry. She is not diaphoretic.          Assessment & Plan:   COPD Moderate Copd with mild tracheobronchitis flare. Need to avoid steroids systemically with hx of bipolar disorder Needs a controller Rx Plan Start Spiriva daily Use doxycycline one twice daily for 7days Proair as needed Return 4 months     Noted I am concerned re Neurontin use in this Copd pt,, I am ok with weaning this medication to off. Updated Medication List Outpatient Encounter Prescriptions as of 05/19/2011  Medication Sig Dispense Refill  . albuterol (PROAIR HFA) 108 (90 BASE) MCG/ACT inhaler Inhale 2 puffs into the lungs every 4 (four) hours as needed for wheezing (shortness of breath).  1 Inhaler  11  . aspirin 81 MG tablet Take 81 mg by mouth daily.        Marland Kitchen atorvastatin (LIPITOR) 80 MG tablet Take 1 tablet (80 mg total) by mouth daily.  90 tablet  11  . b complex vitamins tablet Take 1 tablet by mouth daily.        . clonazePAM (KLONOPIN) 1 MG tablet Take 1 mg by mouth 3 (three) times daily as needed.        . clopidogrel (PLAVIX) 75 MG tablet Take 1 tablet (75 mg total) by mouth daily.  30 tablet  11  . DULoxetine (CYMBALTA) 60 MG capsule Take 60 mg by mouth 2 (two) times daily.        Marland Kitchen esomeprazole (NEXIUM) 40 MG capsule Take 1 capsule (40 mg total) by mouth daily before breakfast.  30 capsule  4  . fish oil-omega-3 fatty acids 1000 MG capsule Take 1 g by mouth daily.        Marland Kitchen gabapentin (NEURONTIN) 400 MG capsule Take 400 mg by mouth at bedtime.        Marland Kitchen loratadine (CLARITIN) 10 MG tablet Take 10 mg by mouth daily.        . Multiple Vitamins-Minerals (MULTIVITAMIN WITH MINERALS) tablet Take 1 tablet by mouth daily.        . nitroGLYCERIN (NITROSTAT) 0.4 MG SL tablet Place 0.4 mg under the tongue every 5 (five) minutes as  needed.        Marland Kitchen olmesartan (BENICAR) 5 MG tablet Take 2 tabs by mouth daily  60 tablet  11  . Throat Lozenges (MENTHOL COUGH DROPS) 5 MG LOZG Use as directed in the mouth or throat as needed.        . topiramate (TOPAMAX) 100 MG tablet 1 every am and 1 1/2 every pm       . doxycycline (VIBRAMYCIN) 100 MG capsule Take 1 capsule (100 mg total) by mouth 2 (two) times daily.  14 capsule  0  . tiotropium (SPIRIVA HANDIHALER) 18 MCG inhalation capsule Place 1 capsule (18 mcg total) into inhaler and inhale daily.  30 capsule  6  . DISCONTD: ALPRAZolam (XANAX) 1 MG tablet 1 tab two to three times daily as needed      . DISCONTD: ARIPiprazole (ABILIFY) 5 MG tablet Take 5 mg by mouth at bedtime.        Marland Kitchen DISCONTD: DM-Doxylamine-Acetaminophen 15-6.25-325 MG/15ML LIQD Take by mouth as needed.        Marland Kitchen DISCONTD: DM-Phenylephrine-Acetaminophen (VICKS DAYQUIL COLD & FLU) 10-5-325 MG/15ML LIQD Take by mouth as needed.        Marland Kitchen DISCONTD:  DULoxetine (CYMBALTA) 30 MG capsule Take 30 mg by mouth 2 (two) times daily.       Marland Kitchen DISCONTD: topiramate (TOPAMAX) 50 MG tablet 100 mg 2 (two) times daily.

## 2011-05-20 NOTE — Assessment & Plan Note (Signed)
Moderate Copd with mild tracheobronchitis flare. Need to avoid steroids systemically with hx of bipolar disorder Needs a controller Rx Plan Start Spiriva daily Use doxycycline one twice daily for 7days Proair as needed Return 4 months

## 2011-05-21 ENCOUNTER — Telehealth: Payer: Self-pay | Admitting: Critical Care Medicine

## 2011-05-21 NOTE — Telephone Encounter (Signed)
noted 

## 2011-05-24 ENCOUNTER — Encounter: Payer: Self-pay | Admitting: Cardiovascular Disease

## 2011-05-24 ENCOUNTER — Ambulatory Visit (INDEPENDENT_AMBULATORY_CARE_PROVIDER_SITE_OTHER): Payer: Medicare Other | Admitting: Cardiovascular Disease

## 2011-05-24 VITALS — BP 103/73 | HR 72 | Ht 61.0 in | Wt 185.0 lb

## 2011-05-24 DIAGNOSIS — I2510 Atherosclerotic heart disease of native coronary artery without angina pectoris: Secondary | ICD-10-CM

## 2011-05-24 MED ORDER — ISOSORBIDE MONONITRATE ER 30 MG PO TB24: 30.0000 mg | ORAL_TABLET | Freq: Every day | ORAL | Status: DC

## 2011-05-24 MED ORDER — OLMESARTAN MEDOXOMIL 5 MG PO TABS: 5.0000 mg | ORAL_TABLET | Freq: Every day | ORAL | Status: DC

## 2011-05-24 NOTE — Patient Instructions (Signed)
Your physician wants you to follow-up in: 6 months.  You will receive a reminder letter in the mail two months in advance. If you don't receive a letter, please call our office to schedule the follow-up appointment.  Your physician has recommended you make the following change in your medication: Decrease benicar to 5 mg by mouth daily. Start Imdur 30 mg by mouth daily.

## 2011-05-24 NOTE — Progress Notes (Signed)
Addended by: Dossie Arbour on: 05/24/2011 01:59 PM   Modules accepted: Orders

## 2011-05-24 NOTE — Assessment & Plan Note (Addendum)
Stable. Will try Imdur 30 mg po  Qdaily. Continue ASA, Plavix, statin and ARB. Will lower dose of Benicar to 5 mg once daily with recent relative hypotension.

## 2011-05-24 NOTE — Progress Notes (Signed)
History of Present Illness: [name] with history of CAD, chronic chest pain, COPD, Barrett's esophagitis, HTN, HLD, bipolar disorder, reflex sympathetic dystrophy, previous suicidal ideations and attempt, COPD, tobacco abuse here today for cardiac follow up. She has been followed in the past by Kara Lamb.  Her cardiac history includes previous stenting of the RCA and left circumflex complicated by in-stent restenosis of her RCA back in 2009. At that time, she underwent drug-eluting stent to her RCA.   Last myoview was in 12/2009 and demonstrated an LVEF 74% with no ischemia or infarct. She was admitted 2/12 with chest pain and cath 07/16/10 demonstrated LAD 20-30%; CFX 20-30%, mCFX stent with 20-30% ISR; mRCA stent with 30-40% ISR, dRCA 30-40%; EF 55-60%. Her GI issues are followed by Kara Lamb. Her last EGD did demonstrate Barrett's Esophagus.Her COPD is followed by Dr. Danise Lamb.   She was admitted for depression to [facility] recently but has done well since discharge.   She had followup lipids and LFTs 03/04/11: LFTs normal, TC 118, HDL 37, LDL 62.   She returns today for cardiac followup. Overall, she is doing well. She has chronic chest pains. This is worsened with deep breaths. She denies exertional chest pain. She denies significant shortness of breath. She denies syncope. She is mainly limited by back pain but has seen a neurosurgeon for this. (Dr. Annell Lamb).    Past Medical History  Diagnosis Date  . CAD (coronary artery disease)     s/p BMS to RCA and LCX 2008. In-stent restenosis of RCA stent rx'd with Xience DES 12/09. Requires extensive sedation for cath. cath 11/10: LAD mild plaque. LCX 10% RCA 50% ISR (FFR 0.91/0.92) Distal RCA 40/50. Cath 2/12: LAD 20-30; CFX 20-30, stent with 20-30; RCA stent with 30-40 and dRCA 30-40; EF 55-60%  . Chronic chest pain   . Anxiety and depression   . H/O: suicide attempt     drug overdose 3/08  . Hypertension   . Obesity   . Tobacco  abuse   . H/O ETOH abuse   . Acid reflux   . Arthritis   . Hyperlipidemia   . Pneumonia   . UTI (lower urinary tract infection)   . Colitis   . GERD (gastroesophageal reflux disease)   . COPD (chronic obstructive pulmonary disease)     Past Surgical History  Procedure Date  . Ptca     stent  . Colon biopsy 08/12/2008  . Tubal ligation 1982  . Cesarean section     x 3  . Carpal tunnel release     Current Outpatient Prescriptions  Medication Sig Dispense Refill  . albuterol (PROAIR HFA) 108 (90 BASE) MCG/ACT inhaler Inhale 2 puffs into the lungs every 4 (four) hours as needed for wheezing (shortness of breath).  1 Inhaler  11  . Ascorbic Acid (VITAMIN C) 1000 MG tablet Take 1,000 mg by mouth daily.        Marland Kitchen aspirin 81 MG tablet Take 81 mg by mouth daily.        Marland Kitchen atorvastatin (LIPITOR) 80 MG tablet Take 1 tablet (80 mg total) by mouth daily.  90 tablet  11  . clonazePAM (KLONOPIN) 1 MG tablet Take 1 mg by mouth 3 (three) times daily as needed.        . clopidogrel (PLAVIX) 75 MG tablet Take 1 tablet (75 mg total) by mouth daily.  30 tablet  11  . doxycycline (VIBRAMYCIN) 100 MG capsule Take  1 capsule (100 mg total) by mouth 2 (two) times daily.  14 capsule  0  . DULoxetine (CYMBALTA) 60 MG capsule Take 60 mg by mouth 2 (two) times daily.        Marland Kitchen esomeprazole (NEXIUM) 40 MG capsule Take 1 capsule (40 mg total) by mouth daily before breakfast.  30 capsule  4  . fish oil-omega-3 fatty acids 1000 MG capsule Take 1 g by mouth daily. Pt taking 4800mg  daily      . gabapentin (NEURONTIN) 400 MG capsule Take 400 mg by mouth at bedtime.        Kara Lamb (GLUCOSAMINE MSM COMPLEX) TABS Take by mouth. 3 tabs daily       . loratadine (CLARITIN) 10 MG tablet Take 10 mg by mouth daily.        . Multiple Vitamins-Minerals (MULTIVITAMIN WITH MINERALS) tablet Take 1 tablet by mouth daily.        . nitroGLYCERIN (NITROSTAT) 0.4 MG SL tablet Place 0.4 mg under the tongue every 5  (five) minutes as needed.        Marland Kitchen olmesartan (BENICAR) 5 MG tablet Take 2 tabs by mouth daily  60 tablet  11  . OVER THE COUNTER MEDICATION Raspberry ketones 100 mg 1 tab twice a day       . OVER THE COUNTER MEDICATION Vitamin B complex liquiod / b 12 sublingual  -1 dropper full daily       . OVER THE COUNTER MEDICATION calicum 1200 mg 1 tab daily       . OVER THE COUNTER MEDICATION Vitamin D3  5000 iu 1 tab am       . tiotropium (SPIRIVA HANDIHALER) 18 MCG inhalation capsule Place 1 capsule (18 mcg total) into inhaler and inhale daily.  30 capsule  6  . topiramate (TOPAMAX) 100 MG tablet 1 every am and 1 1/2 every pm       . traZODone (DESYREL) 100 MG tablet Take 100 mg by mouth at bedtime.          Allergies  Allergen Reactions  . Isosorbide Mononitrate     History   Social History  . Marital Status: Divorced    Spouse Name: N/A    Number of Children: 3  . Years of Education: N/A   Occupational History  .      Workmen's comp   Social History Main Topics  . Smoking status: Former Smoker -- 1.0 packs/day for 35 years    Types: Cigarettes    Quit date: 02/23/2010  . Smokeless tobacco: Never Used  . Alcohol Use: Yes  . Drug Use: Not on file  . Sexually Active: Not on file   Other Topics Concern  . Not on file   Social History Narrative  . No narrative on file    Family History  Problem Relation Age of Onset  . Coronary artery disease Mother   . Emphysema Mother   . Colon cancer Father   . Prostate cancer Father     Review of Systems:  As stated in the HPI and otherwise negative.   BP 103/73  Pulse 72  Ht 5\' 1"  (1.549 m)  Wt 185 lb (83.915 kg)  BMI 34.96 kg/m2  Physical Examination: General: Well developed, well nourished, NAD HEENT: OP clear, mucus membranes moist SKIN: warm, dry. No rashes. Neuro: No focal deficits Musculoskeletal: Muscle strength 5/5 all ext Psychiatric: Mood and affect normal Neck: No JVD, no carotid bruits, no thyromegaly, no  lymphadenopathy. Lungs:Clear bilaterally, no wheezes, rhonci, crackles Cardiovascular: Regular rate and rhythm. No murmurs, gallops or rubs. Abdomen:Soft. Bowel sounds present. Non-tender.  Extremities: No lower extremity edema. Pulses are 2 + in the bilateral DP/PT.

## 2011-05-27 ENCOUNTER — Telehealth: Payer: Self-pay | Admitting: Cardiovascular Disease

## 2011-05-27 MED ORDER — CLOPIDOGREL BISULFATE 75 MG PO TABS: 75.0000 mg | ORAL_TABLET | Freq: Every day | ORAL | Status: DC

## 2011-05-27 NOTE — Telephone Encounter (Signed)
Pt needs refill of generic plavix, uses [facility] [place] , pt doesn't need it to be filled just wants refills on file

## 2011-06-03 ENCOUNTER — Other Ambulatory Visit: Payer: Self-pay

## 2011-06-03 ENCOUNTER — Emergency Department (HOSPITAL_COMMUNITY): Payer: Medicare Other

## 2011-06-03 ENCOUNTER — Inpatient Hospital Stay (HOSPITAL_COMMUNITY)
Admission: EM | Admit: 2011-06-03 | Discharge: 2011-06-05 | DRG: 287 | Disposition: A | Discharge status: Home / Self Care | Payer: Medicare Other | Attending: Cardiovascular Disease | Admitting: Cardiovascular Disease

## 2011-06-03 ENCOUNTER — Encounter (HOSPITAL_COMMUNITY): Payer: Self-pay | Admitting: Emergency Medicine

## 2011-06-03 DIAGNOSIS — R079 Chest pain, unspecified: Secondary | ICD-10-CM | POA: Insufficient documentation

## 2011-06-03 DIAGNOSIS — Z7982 Long term (current) use of aspirin: Secondary | ICD-10-CM

## 2011-06-03 DIAGNOSIS — K2270 Barrett's esophagus without dysplasia: Secondary | ICD-10-CM | POA: Diagnosis present

## 2011-06-03 DIAGNOSIS — E785 Hyperlipidemia, unspecified: Secondary | ICD-10-CM | POA: Diagnosis present

## 2011-06-03 DIAGNOSIS — J439 Emphysema, unspecified: Secondary | ICD-10-CM | POA: Insufficient documentation

## 2011-06-03 DIAGNOSIS — T46905A Adverse effect of unspecified agents primarily affecting the cardiovascular system, initial encounter: Secondary | ICD-10-CM | POA: Diagnosis present

## 2011-06-03 DIAGNOSIS — K219 Gastro-esophageal reflux disease without esophagitis: Secondary | ICD-10-CM | POA: Insufficient documentation

## 2011-06-03 DIAGNOSIS — J449 Chronic obstructive pulmonary disease, unspecified: Secondary | ICD-10-CM | POA: Diagnosis present

## 2011-06-03 DIAGNOSIS — T82897A Other specified complication of cardiac prosthetic devices, implants and grafts, initial encounter: Secondary | ICD-10-CM | POA: Diagnosis present

## 2011-06-03 DIAGNOSIS — I2510 Atherosclerotic heart disease of native coronary artery without angina pectoris: Secondary | ICD-10-CM | POA: Diagnosis present

## 2011-06-03 DIAGNOSIS — J4489 Other specified chronic obstructive pulmonary disease: Secondary | ICD-10-CM | POA: Diagnosis present

## 2011-06-03 DIAGNOSIS — F341 Dysthymic disorder: Secondary | ICD-10-CM | POA: Diagnosis present

## 2011-06-03 DIAGNOSIS — Z79899 Other long term (current) drug therapy: Secondary | ICD-10-CM

## 2011-06-03 DIAGNOSIS — I10 Essential (primary) hypertension: Secondary | ICD-10-CM | POA: Insufficient documentation

## 2011-06-03 DIAGNOSIS — M129 Arthropathy, unspecified: Secondary | ICD-10-CM | POA: Diagnosis present

## 2011-06-03 DIAGNOSIS — F17200 Nicotine dependence, unspecified, uncomplicated: Secondary | ICD-10-CM | POA: Insufficient documentation

## 2011-06-03 DIAGNOSIS — Z7902 Long term (current) use of antithrombotics/antiplatelets: Secondary | ICD-10-CM

## 2011-06-03 DIAGNOSIS — F319 Bipolar disorder, unspecified: Secondary | ICD-10-CM | POA: Diagnosis present

## 2011-06-03 DIAGNOSIS — R0789 Other chest pain: Principal | ICD-10-CM | POA: Diagnosis present

## 2011-06-03 DIAGNOSIS — E669 Obesity, unspecified: Secondary | ICD-10-CM | POA: Diagnosis present

## 2011-06-03 DIAGNOSIS — G9050 Complex regional pain syndrome I, unspecified: Secondary | ICD-10-CM | POA: Diagnosis present

## 2011-06-03 DIAGNOSIS — Y849 Medical procedure, unspecified as the cause of abnormal reaction of the patient, or of later complication, without mention of misadventure at the time of the procedure: Secondary | ICD-10-CM | POA: Diagnosis present

## 2011-06-03 DIAGNOSIS — I9589 Other hypotension: Secondary | ICD-10-CM | POA: Diagnosis present

## 2011-06-03 LAB — POCT I-STAT, CHEM 8
Chloride: 106 mEq/L (ref 96–112)
Glucose, Bld: 135 mg/dL — ABNORMAL HIGH (ref 70–99)
HCT: 38 % (ref 36.0–46.0)
Hemoglobin: 12.9 g/dL (ref 12.0–15.0)
Potassium: 3.6 mEq/L (ref 3.5–5.1)
Sodium: 142 mEq/L (ref 135–145)

## 2011-06-03 LAB — CBC
HCT: 37.5 % (ref 36.0–46.0)
MCV: 98.4 fL (ref 78.0–100.0)
RBC: 3.81 MIL/uL — ABNORMAL LOW (ref 3.87–5.11)
WBC: 7.9 10*3/uL (ref 4.0–10.5)

## 2011-06-03 LAB — POCT I-STAT TROPONIN I: Troponin i, poc: 0 ng/mL (ref 0.00–0.08)

## 2011-06-03 NOTE — ED Notes (Signed)
PT. REPORTS SUBSTERNAL CHEST PAIN ONSET THIS AFTERNOON AND NAUSEA WITH SOB ,  OCCASIONAL COUGH , DIAPHORESIS , PT. ALSO REPORTS LOW BP THIS EVENING AT HOME 82/ 45.

## 2011-06-03 NOTE — ED Provider Notes (Signed)
History     CSN: [id number]  Arrival date & time 06/03/11  2227   First MD Initiated Contact with Patient 06/03/11 2322      Chief Complaint  Patient presents with  . Chest Pain    (Consider location/radiation/quality/duration/timing/severity/associated sxs/prior treatment) Patient is a 55 y.o. female presenting with chest pain. The history is provided by the patient. No language interpreter was used.  Chest Pain The chest pain began 3 - 5 hours ago. Chest pain occurs constantly. The chest pain is unchanged. Associated with: nothing. At its most intense, the pain is at 8/10. The pain is currently at 8/10. The quality of the pain is described as dull and pressure-like. The pain does not radiate (SSCP). Exacerbated by: nothing. Primary symptoms include nausea. Pertinent negatives for primary symptoms include no fever, no fatigue, no syncope, no cough, no wheezing, no palpitations, no abdominal pain, no dizziness and no altered mental status.  Associated symptoms include diaphoresis.  Pertinent negatives for associated symptoms include no claudication and no near-syncope. She tried nothing for the symptoms. Risk factors include post-menopausal.  Her past medical history is significant for COPD, hypertension and MI.  Pertinent negatives for past medical history include no aneurysm.  Pertinent negatives for family medical history include: family history of aortic dissection.  Procedure history is positive for cardiac catheterization and echocardiogram.     Past Medical History  Diagnosis Date  . CAD (coronary artery disease)     s/p BMS to RCA and LCX 2008. In-stent restenosis of RCA stent rx'd with Xience DES 12/09. Requires extensive sedation for cath. cath 11/10: LAD mild plaque. LCX 10% RCA 50% ISR (FFR 0.91/0.92) Distal RCA 40/50. Cath 2/12: LAD 20-30; CFX 20-30, stent with 20-30; RCA stent with 30-40 and dRCA 30-40; EF 55-60%  . Chronic chest pain   . Anxiety and depression   .  H/O: suicide attempt     drug overdose 3/08  . Hypertension   . Obesity   . Tobacco abuse   . H/O ETOH abuse   . Acid reflux   . Arthritis   . Hyperlipidemia   . Pneumonia   . UTI (lower urinary tract infection)   . Colitis   . GERD (gastroesophageal reflux disease)   . COPD (chronic obstructive pulmonary disease)     Past Surgical History  Procedure Date  . Ptca     stent  . Colon biopsy 08/12/2008  . Tubal ligation 1982  . Cesarean section     x 3  . Carpal tunnel release     Family History  Problem Relation Age of Onset  . Coronary artery disease Mother   . Emphysema Mother   . Colon cancer Father   . Prostate cancer Father     History  Substance Use Topics  . Smoking status: Former Smoker -- 1.0 packs/day for 35 years    Types: Cigarettes    Quit date: 02/23/2010  . Smokeless tobacco: Never Used  . Alcohol Use: Yes    OB History    Grav Para Term Preterm Abortions TAB SAB Ect Mult Living                  Review of Systems  Constitutional: Positive for diaphoresis. Negative for fever and fatigue.  HENT: Negative for facial swelling.   Eyes: Negative for discharge.  Respiratory: Negative for cough and wheezing.   Cardiovascular: Positive for chest pain. Negative for palpitations, claudication, syncope and near-syncope.  Gastrointestinal: Positive for  nausea. Negative for abdominal pain.  Genitourinary: Negative for difficulty urinating.  Musculoskeletal: Negative for arthralgias.  Neurological: Negative for dizziness.  Hematological: Negative.   Psychiatric/Behavioral: Negative.  Negative for altered mental status.    Allergies  Review of patient's allergies indicates no known allergies.  Home Medications   Current Outpatient Rx  Name Route Sig Dispense Refill  . ALBUTEROL SULFATE HFA 108 (90 BASE) MCG/ACT IN AERS Inhalation Inhale 2 puffs into the lungs every 4 (four) hours as needed. For shortness of breath     . VITAMIN C 1000 MG PO TABS  Oral Take 1,000 mg by mouth daily.      . ASPIRIN 81 MG PO TABS Oral Take 81 mg by mouth daily.     . ATORVASTATIN CALCIUM 80 MG PO TABS Oral Take 80 mg by mouth daily.      Marland Kitchen CLONAZEPAM 1 MG PO TABS Oral Take 1 mg by mouth 3 (three) times daily as needed. For anxiety    . CLOPIDOGREL BISULFATE 75 MG PO TABS Oral Take 1 tablet (75 mg total) by mouth daily. 30 tablet 11  . DULOXETINE HCL 60 MG PO CPEP Oral Take 60 mg by mouth 2 (two) times daily.      Marland Kitchen ESOMEPRAZOLE MAGNESIUM 40 MG PO CPDR Oral Take 40 mg by mouth daily before breakfast.      . OMEGA-3 FATTY ACIDS 1000 MG PO CAPS Oral Take 2 g by mouth 2 (two) times daily.     Marland Kitchen GABAPENTIN 400 MG PO CAPS Oral Take 400 mg by mouth at bedtime.      Marland Kitchen GLUCOSAMINE MSM COMPLEX PO TABS Oral Take 1 tablet by mouth 3 (three) times daily.     . ISOSORBIDE MONONITRATE ER 30 MG PO TB24 Oral Take 30 mg by mouth daily.      Marland Kitchen LORATADINE 10 MG PO TABS Oral Take 10 mg by mouth daily.      . MULTI-VITAMIN/MINERALS PO TABS Oral Take 1 tablet by mouth daily.     Marland Kitchen OLMESARTAN MEDOXOMIL 5 MG PO TABS Oral Take 5 mg by mouth daily.      Marland Kitchen OVER THE COUNTER MEDICATION  Raspberry ketones 100 mg 1 tab twice a day     . OVER THE COUNTER MEDICATION Oral Take 1 drop by mouth daily. Vitamin B complex liquiod / b 12 sublingual  -1 dropper full daily    . OVER THE COUNTER MEDICATION Oral Take 1 tablet by mouth daily. calicum 1200 mg 1 tab daily    . OVER THE COUNTER MEDICATION Oral Take 1 tablet by mouth daily. Vitamin D3  5000 iu 1 tab am    . TIOTROPIUM BROMIDE MONOHYDRATE 18 MCG IN CAPS Inhalation Place 18 mcg into inhaler and inhale daily.      . TOPIRAMATE 100 MG PO TABS Oral Take 100-150 mg by mouth 2 (two) times daily. 1 every am and 1 1/2 every pm    . TRAZODONE HCL 100 MG PO TABS Oral Take 100 mg by mouth at bedtime.     Marland Kitchen NITROGLYCERIN 0.4 MG SL SUBL Sublingual Place 0.4 mg under the tongue every 5 (five) minutes as needed. For chest pain      BP 102/68  Pulse 83   Temp(Src) 98.2 F (36.8 C) (Oral)  Resp 20  SpO2 99%  Physical Exam  Nursing note and vitals reviewed. Constitutional: She is oriented to person, place, and time. She appears well-developed and well-nourished.  HENT:  Head: Normocephalic and atraumatic.  Mouth/Throat: Oropharynx is clear and moist.  Eyes: EOM are normal. Pupils are equal, round, and reactive to light.  Neck: Normal range of motion. Neck supple. No JVD present.  Cardiovascular: Normal rate and regular rhythm.   Pulmonary/Chest: Effort normal and breath sounds normal. No respiratory distress.  Abdominal: Soft. Bowel sounds are normal. There is no tenderness. There is no rebound and no guarding.  Musculoskeletal: Normal range of motion.  Neurological: She is alert and oriented to person, place, and time.  Skin: Skin is warm and dry. She is not diaphoretic.  Psychiatric: Thought content normal.    ED Course  Procedures (including critical care time)   Labs Reviewed  CBC  BASIC METABOLIC PANEL  I-STAT TROPONIN I   Dg Chest 2 View  06/03/2011  *RADIOLOGY REPORT*  Clinical Data: Chest pain  CHEST - 2 VIEW  Comparison: 03/04/2011  Findings: Mild linear left lung base opacity may represent minimal scarring or atelectasis.  Otherwise clear lungs.  No pleural effusion or pneumothorax.  Stable cardiomediastinal contours, within normal limits.  No acute osseous abnormality with multilevel degenerative changes.  IMPRESSION: No acute process identified.  Original Report Authenticated By: Waneta Martins, M.D.     No diagnosis found.    MDM   Date: 06/03/2011  Rate: 83  Rhythm: normal sinus rhythm  QRS Axis: normal  Intervals: normal  ST/T Wave abnormalities: nonspecific ST changes  Conduction Disutrbances:none  Narrative Interpretation:   Old EKG Reviewed: changes noted         Servando Kyllonen Smitty Cords, MD 06/05/11 [phone]

## 2011-06-03 NOTE — ED Notes (Signed)
Pt stated that she came to the hospital because she was having mid-sternal CP earlier today.Pt stated that she took nitroglycerin earlier for the first time, and it made her BP dropped into the 80s.  Currently no pain or SOB. S1 and S2 heard. Currently nauseated. Pt stated that she is anxious. On telemetry. Will continue to monitor.

## 2011-06-04 ENCOUNTER — Other Ambulatory Visit: Payer: Self-pay

## 2011-06-04 ENCOUNTER — Encounter (HOSPITAL_COMMUNITY)
Admission: EM | Disposition: A | Discharge status: Home / Self Care | Payer: Self-pay | Source: Home / Self Care | Attending: Cardiovascular Disease

## 2011-06-04 DIAGNOSIS — I2510 Atherosclerotic heart disease of native coronary artery without angina pectoris: Secondary | ICD-10-CM

## 2011-06-04 DIAGNOSIS — R079 Chest pain, unspecified: Secondary | ICD-10-CM

## 2011-06-04 HISTORY — PX: LEFT HEART CATHETERIZATION WITH CORONARY ANGIOGRAM: SHX5451

## 2011-06-04 LAB — BASIC METABOLIC PANEL
BUN: 14 mg/dL (ref 6–23)
CO2: 26 mEq/L (ref 19–32)
Chloride: 104 mEq/L (ref 96–112)
Creatinine, Ser: 0.69 mg/dL (ref 0.50–1.10)
Potassium: 3.4 mEq/L — ABNORMAL LOW (ref 3.5–5.1)

## 2011-06-04 LAB — CARDIAC PANEL(CRET KIN+CKTOT+MB+TROPI)
CK, MB: 2.1 ng/mL (ref 0.3–4.0)
Total CK: 95 U/L (ref 7–177)
Total CK: 111 U/L (ref 7–177)
Troponin I: <0.3 ng/mL (ref <0.30)

## 2011-06-04 LAB — POCT ACTIVATED CLOTTING TIME: Activated Clotting Time: 551 seconds

## 2011-06-04 LAB — PROTIME-INR: INR: 1.06 (ref 0.00–1.49)

## 2011-06-04 SURGERY — LEFT HEART CATHETERIZATION WITH CORONARY ANGIOGRAM
Laterality: Left

## 2011-06-04 SURGERY — LEFT HEART CATHETERIZATION WITH CORONARY ANGIOGRAM
Anesthesia: LOCAL

## 2011-06-04 MED ORDER — HEPARIN (PORCINE) IN NACL 100-0.45 UNIT/ML-% IJ SOLN
1000.0000 [IU]/h | INTRAMUSCULAR | Status: DC
  Administered 2011-06-04: 1000 [IU]/h via INTRAVENOUS

## 2011-06-04 MED ORDER — NITROGLYCERIN 0.4 MG SL SUBL: 0.4000 mg | SUBLINGUAL_TABLET | SUBLINGUAL | Status: DC | PRN

## 2011-06-04 MED ORDER — ALBUTEROL SULFATE HFA 108 (90 BASE) MCG/ACT IN AERS
2.0000 | INHALATION_SPRAY | RESPIRATORY_TRACT | Status: DC | PRN
  Administered 2011-06-05: 2 via RESPIRATORY_TRACT
  Filled 2011-06-04 (×2): qty 6.7

## 2011-06-04 MED ORDER — FENTANYL CITRATE 0.05 MG/ML IJ SOLN
INTRAMUSCULAR | Status: AC
  Filled 2011-06-04: qty 2

## 2011-06-04 MED ORDER — SODIUM CHLORIDE 0.9 % IV BOLUS (SEPSIS)
1000.0000 mL | Freq: Once | INTRAVENOUS | Status: AC
  Administered 2011-06-04: 1000 mL via INTRAVENOUS

## 2011-06-04 MED ORDER — PROMETHAZINE HCL 25 MG/ML IJ SOLN
12.5000 mg | Freq: Four times a day (QID) | INTRAMUSCULAR | Status: DC | PRN
  Administered 2011-06-04: 12.5 mg via INTRAVENOUS
  Filled 2011-06-04 (×2): qty 1

## 2011-06-04 MED ORDER — ASPIRIN 81 MG PO CHEW
324.0000 mg | CHEWABLE_TABLET | ORAL | Status: AC
  Administered 2011-06-04: 324 mg via ORAL
  Filled 2011-06-04: qty 4

## 2011-06-04 MED ORDER — ACETAMINOPHEN 325 MG PO TABS: 650.0000 mg | ORAL_TABLET | ORAL | Status: DC | PRN

## 2011-06-04 MED ORDER — MORPHINE SULFATE 2 MG/ML IJ SOLN
2.0000 mg | INTRAMUSCULAR | Status: AC
  Administered 2011-06-04: 2 mg via INTRAVENOUS

## 2011-06-04 MED ORDER — ISOSORBIDE MONONITRATE 15 MG HALF TABLET
15.0000 mg | ORAL_TABLET | Freq: Every day | ORAL | Status: DC
  Administered 2011-06-04 – 2011-06-05 (×2): 15 mg via ORAL
  Filled 2011-06-04 (×4): qty 1

## 2011-06-04 MED ORDER — CLONAZEPAM 0.5 MG PO TABS
1.0000 mg | ORAL_TABLET | Freq: Three times a day (TID) | ORAL | Status: DC | PRN
  Administered 2011-06-04 – 2011-06-05 (×2): 1 mg via ORAL
  Filled 2011-06-04: qty 2
  Filled 2011-06-04 (×2): qty 1

## 2011-06-04 MED ORDER — FAMOTIDINE IN NACL 20-0.9 MG/50ML-% IV SOLN
INTRAVENOUS | Status: AC
  Filled 2011-06-04: qty 50

## 2011-06-04 MED ORDER — ONDANSETRON HCL 4 MG/2ML IJ SOLN
4.0000 mg | Freq: Once | INTRAMUSCULAR | Status: DC
  Filled 2011-06-04: qty 2

## 2011-06-04 MED ORDER — ONDANSETRON HCL 4 MG/2ML IJ SOLN
4.0000 mg | Freq: Four times a day (QID) | INTRAMUSCULAR | Status: DC | PRN
  Administered 2011-06-04 (×2): 4 mg via INTRAVENOUS
  Filled 2011-06-04 (×2): qty 2

## 2011-06-04 MED ORDER — TIOTROPIUM BROMIDE MONOHYDRATE 18 MCG IN CAPS
18.0000 ug | ORAL_CAPSULE | Freq: Every day | RESPIRATORY_TRACT | Status: DC
  Administered 2011-06-04 – 2011-06-05 (×2): 18 ug via RESPIRATORY_TRACT
  Filled 2011-06-04 (×2): qty 5

## 2011-06-04 MED ORDER — ONDANSETRON HCL 4 MG/2ML IJ SOLN
INTRAMUSCULAR | Status: AC
  Filled 2011-06-04: qty 2

## 2011-06-04 MED ORDER — POTASSIUM CHLORIDE CRYS ER 20 MEQ PO TBCR
40.0000 meq | EXTENDED_RELEASE_TABLET | Freq: Two times a day (BID) | ORAL | Status: DC
  Administered 2011-06-04 – 2011-06-05 (×3): 40 meq via ORAL
  Filled 2011-06-04 (×4): qty 2
  Filled 2011-06-04: qty 1
  Filled 2011-06-04 (×2): qty 2
  Filled 2011-06-04: qty 1
  Filled 2011-06-04: qty 2

## 2011-06-04 MED ORDER — SODIUM CHLORIDE 0.9 % IV SOLN: INTRAVENOUS | Status: AC

## 2011-06-04 MED ORDER — OLMESARTAN MEDOXOMIL 5 MG PO TABS
5.0000 mg | ORAL_TABLET | Freq: Every day | ORAL | Status: DC
  Filled 2011-06-04: qty 1

## 2011-06-04 MED ORDER — MIDAZOLAM HCL 2 MG/2ML IJ SOLN
INTRAMUSCULAR | Status: AC
  Filled 2011-06-04: qty 2

## 2011-06-04 MED ORDER — SODIUM CHLORIDE 0.9 % IV SOLN
INTRAVENOUS | Status: DC
  Administered 2011-06-04: 06:00:00 via INTRAVENOUS

## 2011-06-04 MED ORDER — CLOPIDOGREL BISULFATE 75 MG PO TABS
75.0000 mg | ORAL_TABLET | Freq: Every day | ORAL | Status: DC
  Administered 2011-06-04 – 2011-06-05 (×2): 75 mg via ORAL
  Filled 2011-06-04 (×2): qty 1

## 2011-06-04 MED ORDER — DULOXETINE HCL 60 MG PO CPEP
60.0000 mg | ORAL_CAPSULE | Freq: Two times a day (BID) | ORAL | Status: DC
  Administered 2011-06-04 – 2011-06-05 (×3): 60 mg via ORAL
  Filled 2011-06-04 (×9): qty 1

## 2011-06-04 MED ORDER — BIVALIRUDIN 250 MG IV SOLR
INTRAVENOUS | Status: AC
  Filled 2011-06-04: qty 250

## 2011-06-04 MED ORDER — OMEGA-3-ACID ETHYL ESTERS 1 G PO CAPS
2.0000 g | ORAL_CAPSULE | Freq: Two times a day (BID) | ORAL | Status: DC
  Administered 2011-06-04 – 2011-06-05 (×2): 2 g via ORAL
  Filled 2011-06-04 (×8): qty 2

## 2011-06-04 MED ORDER — TOPIRAMATE 25 MG PO TABS
150.0000 mg | ORAL_TABLET | Freq: Every day | ORAL | Status: DC
  Administered 2011-06-04: 150 mg via ORAL
  Filled 2011-06-04 (×5): qty 2

## 2011-06-04 MED ORDER — PANTOPRAZOLE SODIUM 40 MG PO TBEC
40.0000 mg | DELAYED_RELEASE_TABLET | Freq: Every day | ORAL | Status: DC
  Administered 2011-06-04 – 2011-06-05 (×2): 40 mg via ORAL
  Filled 2011-06-04 (×2): qty 1

## 2011-06-04 MED ORDER — ASPIRIN 81 MG PO CHEW
324.0000 mg | CHEWABLE_TABLET | ORAL | Status: DC
  Filled 2011-06-04: qty 4

## 2011-06-04 MED ORDER — NITROGLYCERIN 0.2 MG/ML ON CALL CATH LAB
INTRAVENOUS | Status: AC
  Filled 2011-06-04: qty 1

## 2011-06-04 MED ORDER — ONDANSETRON HCL 4 MG/2ML IJ SOLN: 4.0000 mg | Freq: Four times a day (QID) | INTRAMUSCULAR | Status: DC | PRN

## 2011-06-04 MED ORDER — SODIUM CHLORIDE 0.9 % IV SOLN: 250.0000 mL | INTRAVENOUS | Status: DC | PRN

## 2011-06-04 MED ORDER — TRAZODONE HCL 100 MG PO TABS
100.0000 mg | ORAL_TABLET | Freq: Every day | ORAL | Status: DC
  Administered 2011-06-04: 100 mg via ORAL
  Filled 2011-06-04 (×5): qty 1

## 2011-06-04 MED ORDER — ASPIRIN EC 81 MG PO TBEC
81.0000 mg | DELAYED_RELEASE_TABLET | Freq: Every day | ORAL | Status: DC
  Administered 2011-06-05: 81 mg via ORAL
  Filled 2011-06-04 (×3): qty 1

## 2011-06-04 MED ORDER — FENTANYL CITRATE 0.05 MG/ML IJ SOLN
50.0000 ug | Freq: Once | INTRAMUSCULAR | Status: AC
  Administered 2011-06-04: 50 ug via INTRAVENOUS
  Filled 2011-06-04: qty 2

## 2011-06-04 MED ORDER — LORATADINE 10 MG PO TABS
10.0000 mg | ORAL_TABLET | Freq: Every day | ORAL | Status: DC
  Administered 2011-06-04 – 2011-06-05 (×2): 10 mg via ORAL
  Filled 2011-06-04 (×4): qty 1

## 2011-06-04 MED ORDER — ASPIRIN 300 MG RE SUPP: 300.0000 mg | RECTAL | Status: AC

## 2011-06-04 MED ORDER — TOPIRAMATE 100 MG PO TABS: 100.0000 mg | ORAL_TABLET | Freq: Two times a day (BID) | ORAL | Status: DC

## 2011-06-04 MED ORDER — ROSUVASTATIN CALCIUM 40 MG PO TABS
40.0000 mg | ORAL_TABLET | Freq: Every day | ORAL | Status: DC
  Administered 2011-06-04: 40 mg via ORAL
  Filled 2011-06-04 (×5): qty 1

## 2011-06-04 MED ORDER — PROMETHAZINE HCL 25 MG/ML IJ SOLN
12.5000 mg | Freq: Four times a day (QID) | INTRAMUSCULAR | Status: DC | PRN
  Administered 2011-06-04: 12.5 mg via INTRAVENOUS

## 2011-06-04 MED ORDER — HEPARIN (PORCINE) IN NACL 2-0.9 UNIT/ML-% IJ SOLN
INTRAMUSCULAR | Status: AC
  Filled 2011-06-04: qty 2000

## 2011-06-04 MED ORDER — SODIUM CHLORIDE 0.9 % IJ SOLN: 3.0000 mL | Freq: Two times a day (BID) | INTRAMUSCULAR | Status: DC

## 2011-06-04 MED ORDER — MORPHINE SULFATE 2 MG/ML IJ SOLN
INTRAMUSCULAR | Status: AC
  Administered 2011-06-04: 2 mg via INTRAVENOUS
  Filled 2011-06-04: qty 1

## 2011-06-04 MED ORDER — HEPARIN SOD (PORCINE) IN D5W 100 UNIT/ML IV SOLN
INTRAVENOUS | Status: AC
  Administered 2011-06-04: 25000 [IU]
  Filled 2011-06-04: qty 250

## 2011-06-04 MED ORDER — OMEGA-3 FATTY ACIDS 1000 MG PO CAPS
2.0000 g | ORAL_CAPSULE | Freq: Two times a day (BID) | ORAL | Status: DC
  Administered 2011-06-04: 2 g via ORAL
  Filled 2011-06-04 (×3): qty 2

## 2011-06-04 MED ORDER — LIDOCAINE HCL (PF) 1 % IJ SOLN
INTRAMUSCULAR | Status: AC
Start: 2011-06-04 — End: 2011-06-04
  Filled 2011-06-04: qty 30

## 2011-06-04 MED ORDER — MORPHINE SULFATE 2 MG/ML IJ SOLN
2.0000 mg | INTRAMUSCULAR | Status: DC | PRN
  Administered 2011-06-04 – 2011-06-05 (×9): 2 mg via INTRAVENOUS
  Filled 2011-06-04 (×9): qty 1

## 2011-06-04 MED ORDER — ONDANSETRON HCL 4 MG/2ML IJ SOLN
4.0000 mg | Freq: Once | INTRAMUSCULAR | Status: AC
  Administered 2011-06-04: 4 mg via INTRAVENOUS

## 2011-06-04 MED ORDER — ADENOSINE 12 MG/4ML IV SOLN
16.0000 mL | INTRAVENOUS | Status: DC
  Filled 2011-06-04: qty 16

## 2011-06-04 MED ORDER — ACETAMINOPHEN 325 MG PO TABS
650.0000 mg | ORAL_TABLET | ORAL | Status: DC | PRN
  Administered 2011-06-04 – 2011-06-05 (×2): 650 mg via ORAL
  Filled 2011-06-04 (×2): qty 2

## 2011-06-04 MED ORDER — FENTANYL CITRATE 0.05 MG/ML IJ SOLN
50.0000 ug | Freq: Once | INTRAMUSCULAR | Status: AC
  Administered 2011-06-04: 50 ug via INTRAVENOUS

## 2011-06-04 MED ORDER — GABAPENTIN 400 MG PO CAPS
400.0000 mg | ORAL_CAPSULE | Freq: Every day | ORAL | Status: DC
  Filled 2011-06-04 (×4): qty 1

## 2011-06-04 MED ORDER — SODIUM CHLORIDE 0.9 % IJ SOLN: 3.0000 mL | INTRAMUSCULAR | Status: DC | PRN

## 2011-06-04 MED ORDER — HEPARIN BOLUS VIA INFUSION
4000.0000 [IU] | Freq: Once | INTRAVENOUS | Status: DC
  Administered 2011-06-04: 4000 [IU] via INTRAVENOUS

## 2011-06-04 MED ORDER — SODIUM CHLORIDE 0.9 % IV SOLN: 1.0000 mL/kg/h | INTRAVENOUS | Status: DC

## 2011-06-04 MED ORDER — PROMETHAZINE HCL 25 MG/ML IJ SOLN
12.5000 mg | INTRAMUSCULAR | Status: AC
  Filled 2011-06-04: qty 1

## 2011-06-04 MED ORDER — TOPIRAMATE 100 MG PO TABS
100.0000 mg | ORAL_TABLET | Freq: Every day | ORAL | Status: DC
  Administered 2011-06-04 – 2011-06-05 (×2): 100 mg via ORAL
  Filled 2011-06-04 (×4): qty 1

## 2011-06-04 NOTE — Op Note (Signed)
Cardiac Catheterization Operative Report  Kara Lamb [id number] 12/21/20123:24 PM Sanda Linger, MD, MD  Procedure Performed:  1. Left Heart Catheterization 2. Selective Coronary Angiography 3. Left ventricular angiogram 4. Fractional flow reserve of the RCA  Operator: Verne Carrow, MD  Indication: Ongoing chest pain in a female patient with known CAD, negative cardiac enzymes.                                       Procedure Details: The risks, benefits, complications, treatment options, and expected outcomes were discussed with the patient. The patient and/or family concurred with the proposed plan, giving informed consent. The patient was brought to the cath lab after IV hydration was begun and oral premedication was given. The patient was further sedated with Versed and Fentanyl. The right groin was prepped and draped in the usual manner. Using the modified Seldinger access technique, a 5 French sheath was placed in the right femoral artery.  Standard diagnostic catheters were used to perform selective coronary angiography. A pigtail catheter was used to perform a left ventricular angiogram. There were no immediate complications. The patient had a moderate stenosis in the mid RCA in the area of the drug eluting stent that had been placed in 2009. We elected to perform a FFR of the RCA to assess flow down the vessel. She was given a bolus of Angiomax and a drip was started. A 5 french JR 4 guiding catheter was used to engage the vessel. When the ACT was over 200, I advanced a flow wire down the RCA. The baseline FFR was 1.0. With infusion of IV adenosine, the FFR was 0.98. This suggested that the stenosis was not significant. The patient was taken to the recovery area in stable condition.   Coronary Interventions:  Hemodynamic Findings: Ao: 122/72                                                            LV: 107/11/14  Angiographic Findings:  Left main:  No disease.  Left  Anterior Descending Artery: Mild 20% plaque throughout. Small caliber Diagonal branch with 50% stenosis proximally.   Circumflex Artery: Mid 40% stenosis. Mild plaque OM1.   Right Coronary Artery: Proximal stent patent with minimal restenosis. Mid stent with 60% in stent restenosis. Distal vessel with 20% plaque. PDA and PLA with mild plaque.   Left Ventricular Angiogram: LVEF 55%.    Impression:  1. Single vessel CAD with moderate in stent restenosis in the RCA stent. This is not flow limiting by FFR.  2. Moderate disease in the LAD and Circumflex.  3. Preserved LF function.   Recommendations: Medical management. She may need to see her GI specialist.        Complications:  None; patient tolerated the procedure well.

## 2011-06-04 NOTE — Progress Notes (Signed)
Patient ID: Kara Lamb, female   DOB: April 17, 1956, 55 y.o.   MRN: [id number]     SUBJECTIVE:  Still with ongoing chest pain this morning radiating up towards her shoulder.  She says it is different from her chronic pattern of chest pain and is worrisome for the pain prior to getting stents.  Morphine and NTG have not helped the pain.  BP is better this morning.    Filed Vitals:   06/04/11 0430 06/04/11 0500 06/04/11 0526 06/04/11 0710  BP: 95/60  102/66 102/66  Pulse: 69  75 62  Temp:   97.5 F (36.4 C) 97.5 F (36.4 C)  TempSrc:   Oral Oral  Resp:   16 18  Height:  5\' 1"  (1.549 m)    Weight:  83.7 kg (184 lb 8.4 oz)    SpO2: 96%  97% 97%   No intake or output data in the 24 hours ending 06/04/11 0734  LABS: Basic Metabolic Panel:  Basename 06/03/11 2344 06/03/11 2304  NA 142 138  K 3.6 3.4*  CL 106 104  CO2 -- 26  GLUCOSE 135* 137*  BUN 15 14  CREATININE 0.90 0.69  CALCIUM -- 9.6  MG -- --  PHOS -- --   Liver Function Tests: No results found for this basename: AST:2,ALT:2,ALKPHOS:2,BILITOT:2,PROT:2,ALBUMIN:2 in the last 72 hours No results found for this basename: LIPASE:2,AMYLASE:2 in the last 72 hours CBC:  Basename 06/03/11 2344 06/03/11 2304  WBC -- 7.9  NEUTROABS -- --  HGB 12.9 12.6  HCT 38.0 37.5  MCV -- 98.4  PLT -- 221    Past Medical History  Diagnosis  Date  .  CAD (coronary artery disease)  s/p BMS to RCA and LCX 2008. In-stent restenosis of RCA stent rx'd with Xience DES 12/09. Requires extensive sedation for cath. cath 11/10: LAD mild plaque. LCX 10% RCA 50% ISR (FFR 0.91/0.92) Distal RCA 40/50. Cath 2/12: LAD 20-30; CFX 20-30, stent with 20-30; RCA stent with 30-40 and dRCA 30-40; EF 55-60%  .  Chronic chest pain  .  Anxiety and depression  .  H/O: suicide attempt  drug overdose 3/08  .  Hypertension  .  Obesity  .  Tobacco abuse  .  H/O ETOH abuse  .  Acid reflux  .  Arthritis  .  Hyperlipidemia  .  Pneumonia  .    UTI (lower urinary tract infection)  .  Colitis  .  GERD (gastroesophageal reflux disease)  .  COPD (chronic obstructive pulmonary disease)    RADIOLOGY: Dg Chest 2 View  06/03/2011  *RADIOLOGY REPORT*  Clinical Data: Chest pain  CHEST - 2 VIEW  Comparison: 03/04/2011  Findings: Mild linear left lung base opacity may represent minimal scarring or atelectasis.  Otherwise clear lungs.  No pleural effusion or pneumothorax.  Stable cardiomediastinal contours, within normal limits.  No acute osseous abnormality with multilevel degenerative changes.  IMPRESSION: No acute process identified.  Original Report Authenticated By: Waneta Martins, M.D.    PHYSICAL EXAM General: NAD Neck: No JVD, no thyromegaly or thyroid nodule.  Lungs: Clear to auscultation bilaterally with normal respiratory effort. CV: Nondisplaced PMI.  Heart regular S1/S2, no S3/S4, no murmur.  No peripheral edema.  No carotid bruit.  Normal pedal pulses.  Abdomen: Soft, nontender, no hepatosplenomegaly, no distention.  Neurologic: Alert and oriented x 3.  Psych: Normal affect. Extremities: No clubbing or cyanosis.   ASSESSMENT AND PLAN:  55 yo with history of CAD presents  with prolonged chest pain and hypotension.  She became hypotensive after forgetting to cut back on her Benicar dose when she started Imdur.  She had a cath with nonobstructive disease in 2/12 and a non-ischemic myoview in 9/12.   1. Chest pain: Unremitting despite narcotics.  Seems to be related to hypotensive episode likely from meds (SBP was in 80s).  Initial troponin negative despite hours of pain and ECG unremarkable, repeat cardiac enzymes pending.  She has chronic chest pain but is very worried that this chest pain is different and feels like pain before her stent in the past.  Microvascular angina could certainly be possible.  - Cycle cardiac enzymes - Probably best way to resolve the issue here given ongoing severe pain and prior h/o CAD is  going to be left heart cath.  I will arrange for this am.  2. Hypotension: Suspect due to taking Benicar without lowering dose at same time as Imdur (new med).  I will have her stop Benicar as her SBP is only running in the 100s.   Marca Ancona 06/04/2011 7:41 AM

## 2011-06-04 NOTE — H&P (Signed)
PCP: Clifton James  CC: Chest Pain  HPI:  5 YOWF with PMH of CAD, chronic chest pain, COPD, Barrett's esophagitis, HTN, HLD, bipolar disorder, reflex sympathetic dystrophy, previous suicidal ideations and attempt, COPD, tobacco abuse here today for cardiac follow up. She has been followed in the past by Dr. Gala Romney. Her cardiac history includes previous stenting of the RCA and left circumflex complicated by in-stent restenosis of her RCA back in 2009. At that time, she underwent drug-eluting stent to her RCA. She was admitted 2/12 with chest pain and cath 07/16/10 demonstrated LAD 20-30%; CFX 20-30%, mCFX stent with 20-30% ISR; mRCA stent with 30-40% ISR, dRCA 30-40%; EF 55-60%. Last myoview was in 02/2011 and demonstrated normal LV function and no ischemia or infarct.   She recently saw Dr. Clifton James for evaluation an management of chronic chest pain.  The decision was made to start imdur 30mg  daily and she was instructed to decrease her benicar from 10mg  to 5mg  daily.  Today was the first day that she took the imdur; however, she forgot to decrease her benicar dose.  She began having a headache and getting dizzy around 2pm.  Her BP was low all day with SBP 80s.  Around 3pm she developed substernal chest pain that has been constant for almost 12 hours.  She denies recent illnesses.    Past Medical History  Diagnosis Date  . CAD (coronary artery disease)     s/p BMS to RCA and LCX 2008. In-stent restenosis of RCA stent rx'd with Xience DES 12/09. Requires extensive sedation for cath. cath 11/10: LAD mild plaque. LCX 10% RCA 50% ISR (FFR 0.91/0.92) Distal RCA 40/50. Cath 2/12: LAD 20-30; CFX 20-30, stent with 20-30; RCA stent with 30-40 and dRCA 30-40; EF 55-60%  . Chronic chest pain   . Anxiety and depression   . H/O: suicide attempt     drug overdose 3/08  . Hypertension   . Obesity   . Tobacco abuse   . H/O ETOH abuse   . Acid reflux   . Arthritis   . Hyperlipidemia   . Pneumonia   . UTI (lower  urinary tract infection)   . Colitis   . GERD (gastroesophageal reflux disease)   . COPD (chronic obstructive pulmonary disease)     History   Social History  . Marital Status: Divorced    Spouse Name: N/A    Number of Children: 3  . Years of Education: N/A   Occupational History  .      Workmen's comp   Social History Main Topics  . Smoking status: Former Smoker -- 1.0 packs/day for 35 years    Types: Cigarettes    Quit date: 02/23/2010  . Smokeless tobacco: Never Used  . Alcohol Use: Yes  . Drug Use: No  . Sexually Active: Not on file   Other Topics Concern  . Not on file   Social History Narrative  . No narrative on file    Family History  Problem Relation Age of Onset  . Coronary artery disease Mother   . Emphysema Mother   . Colon cancer Father   . Prostate cancer Father     Medications Prior to Admission  Medication Dose Route Frequency Provider Last Rate Last Dose  . fentaNYL (SUBLIMAZE) injection 50 mcg  50 mcg Intravenous Once April K Palumbo-Rasch, MD   50 mcg at 06/04/11 0043  . fentaNYL (SUBLIMAZE) injection 50 mcg  50 mcg Intravenous Once April Smitty Cords, MD  50 mcg at 06/04/11 0129  . heparin 100 UNIT/ML infusion        25,000 Units at 06/04/11 0122  . heparin ADULT infusion 100 units/mL (25000 units/250 mL)  1,000 Units/hr Intravenous Continuous April K Palumbo-Rasch, MD      . heparin bolus via infusion 4,000 Units  4,000 Units Intravenous Once April K Palumbo-Rasch, MD   4,000 Units at 06/04/11 0132  . ondansetron [facility]) injection 4 mg  4 mg Intramuscular Once April K Palumbo-Rasch, MD      . ondansetron [facility]) injection 4 mg  4 mg Intravenous Once April K Palumbo-Rasch, MD   4 mg at 06/04/11 0109  . sodium chloride 0.9 % bolus 1,000 mL  1,000 mL Intravenous Once April K Palumbo-Rasch, MD   1,000 mL at 06/04/11 0040   Medications Prior to Admission  Medication Sig Dispense Refill  . albuterol (PROVENTIL HFA;VENTOLIN HFA) 108 (90 BASE)  MCG/ACT inhaler Inhale 2 puffs into the lungs every 4 (four) hours as needed. For shortness of breath       . Ascorbic Acid (VITAMIN C) 1000 MG tablet Take 1,000 mg by mouth daily.        Marland Kitchen aspirin 81 MG tablet Take 81 mg by mouth daily.       Marland Kitchen atorvastatin (LIPITOR) 80 MG tablet Take 80 mg by mouth daily.        . clonazePAM (KLONOPIN) 1 MG tablet Take 1 mg by mouth 3 (three) times daily as needed. For anxiety      . clopidogrel (PLAVIX) 75 MG tablet Take 1 tablet (75 mg total) by mouth daily.  30 tablet  11  . DULoxetine (CYMBALTA) 60 MG capsule Take 60 mg by mouth 2 (two) times daily.        Marland Kitchen esomeprazole (NEXIUM) 40 MG capsule Take 40 mg by mouth daily before breakfast.        . fish oil-omega-3 fatty acids 1000 MG capsule Take 2 g by mouth 2 (two) times daily.       Marland Kitchen gabapentin (NEURONTIN) 400 MG capsule Take 400 mg by mouth at bedtime.        Stephanie Acre (GLUCOSAMINE MSM COMPLEX) TABS Take 1 tablet by mouth 3 (three) times daily.       . isosorbide mononitrate (IMDUR) 30 MG 24 hr tablet Take 30 mg by mouth daily.        Marland Kitchen loratadine (CLARITIN) 10 MG tablet Take 10 mg by mouth daily.        . Multiple Vitamins-Minerals (MULTIVITAMIN WITH MINERALS) tablet Take 1 tablet by mouth daily.       Marland Kitchen olmesartan (BENICAR) 5 MG tablet Take 5 mg by mouth daily.        Marland Kitchen OVER THE COUNTER MEDICATION Raspberry ketones 100 mg 1 tab twice a day       . OVER THE COUNTER MEDICATION Take 1 drop by mouth daily. Vitamin B complex liquiod / b 12 sublingual  -1 dropper full daily      . OVER THE COUNTER MEDICATION Take 1 tablet by mouth daily. calicum 1200 mg 1 tab daily      . OVER THE COUNTER MEDICATION Take 1 tablet by mouth daily. Vitamin D3  5000 iu 1 tab am      . tiotropium (SPIRIVA) 18 MCG inhalation capsule Place 18 mcg into inhaler and inhale daily.        Marland Kitchen topiramate (TOPAMAX) 100 MG tablet Take 100-150 mg by mouth 2 (  two) times daily. 1 every am and 1 1/2 every pm      .  traZODone (DESYREL) 100 MG tablet Take 100 mg by mouth at bedtime.       . nitroGLYCERIN (NITROSTAT) 0.4 MG SL tablet Place 0.4 mg under the tongue every 5 (five) minutes as needed. For chest pain        No Known Allergies  Review of Systems: All systems reviewed and are negative except as mentioned above in the history of present illness.    PHYSICAL EXAM: Filed Vitals:   06/04/11 0045  BP: 101/55  Pulse: 67  Temp:   Resp: 16   GENERAL: No acute distress.   HEENT: Normocephalic, atraumatic.  Oropharynx is pink and moist without lesions.  NECK: Supple, no LAD, no JVD, no masses. CV: Regular rate and rhythm with no murmurs, rubs, or gallops.   LUNGS: Clear to auscultation bilaterally.   ABDOMEN: +BS, soft, nontender, nondistended.  EXTREMITIES: No clubbing, cyanosis, or edema.   NEURO: AO x 3, no focal deficits. PYSCH: Normal affect. SKIN: No rashes.    ECG: SR 83 BPM with no active ischemia; mild flattening of the [place] laterally.   Results for orders placed during the hospital encounter of 06/03/11 (from the past 24 hour(s))  CBC     Status: Abnormal   Collection Time   06/03/11 11:04 PM      Component Value Range   WBC 7.9  4.0 - 10.5 (K/uL)   RBC 3.81 (*) 3.87 - 5.11 (MIL/uL)   Hemoglobin 12.6  12.0 - 15.0 (g/dL)   HCT 16.1  09.6 - 04.5 (%)   MCV 98.4  78.0 - 100.0 (fL)   MCH 33.1  26.0 - 34.0 (pg)   MCHC 33.6  30.0 - 36.0 (g/dL)   RDW 40.9  81.1 - 91.4 (%)   Platelets 221  150 - 400 (K/uL)  BASIC METABOLIC PANEL     Status: Abnormal   Collection Time   06/03/11 11:04 PM      Component Value Range   Sodium 138  135 - 145 (mEq/L)   Potassium 3.4 (*) 3.5 - 5.1 (mEq/L)   Chloride 104  96 - 112 (mEq/L)   CO2 26  19 - 32 (mEq/L)   Glucose, Bld 137 (*) 70 - 99 (mg/dL)   BUN 14  6 - 23 (mg/dL)   Creatinine, Ser 7.82  0.50 - 1.10 (mg/dL)   Calcium 9.6  8.4 - 95.6 (mg/dL)   GFR calc non Af Amer >90  >90 (mL/min)   GFR calc Af Amer >90  >90 (mL/min)  POCT I-STAT  TROPONIN I     Status: Normal   Collection Time   06/03/11 11:24 PM      Component Value Range   Troponin i, poc 0.00  0.00 - 0.08 (ng/mL)   Comment 3           POCT I-STAT, CHEM 8     Status: Abnormal   Collection Time   06/03/11 11:44 PM      Component Value Range   Sodium 142  135 - 145 (mEq/L)   Potassium 3.6  3.5 - 5.1 (mEq/L)   Chloride 106  96 - 112 (mEq/L)   BUN 15  6 - 23 (mg/dL)   Creatinine, Ser 2.13  0.50 - 1.10 (mg/dL)   Glucose, Bld 086 (*) 70 - 99 (mg/dL)   Calcium, Ion 5.78  4.69 - 1.32 (mmol/L)   TCO2 25  0 - 100 (mmol/L)   Hemoglobin 12.9  12.0 - 15.0 (g/dL)   HCT 16.1  09.6 - 04.5 (%)   Dg Chest 2 View  06/03/2011  *RADIOLOGY REPORT*  Clinical Data: Chest pain  CHEST - 2 VIEW  Comparison: 03/04/2011  Findings: Mild linear left lung base opacity may represent minimal scarring or atelectasis.  Otherwise clear lungs.  No pleural effusion or pneumothorax.  Stable cardiomediastinal contours, within normal limits.  No acute osseous abnormality with multilevel degenerative changes.  IMPRESSION: No acute process identified.  Original Report Authenticated By: Waneta Martins, M.D.    ASSESSMENT and PLAN: 4 YOWF with PMH of CAD s/p PCI with recent cath showing non-obstructive disease (07/2010) and negative myoview (02/2011) who presents with hypotension and chest pain.   1: Admit to [facility].   2: Chest pain - her chest pain is difficult to sort of because it is somewhat chronic in nature; ddx includes angina, anxiety, musculoskeletal chest pain.  She currently has a negative troponin despite 12 hours of chest pain and her EKG doesn't show ischemia which is very reassuring.  Recommend continuing home medications including ASA, plavix, statin.  She was started on heparin in the ER and we will continue that medication until she rules out.  Decrease imdur to 15mg  daily and decrease benicar to 5mg  daily given her hypotension.  Depending on her clinical course this  evening, she may or may not need an ischemia evaluation.  If she needs an ischemia evaluation, I would favor cath given her recent negative stress test in September; furthermore, it may be helpful to definitively define her coronary anatomy as this may help with management of her chronic chest pain.  3: HTN - Decrease benicar to 5mg  daily; decrease imdur to 15mg  daily  4: HLD - home meds  5: FEN - NS at 75cc/hr, 43me1 KCL, NPO  6: DVT proph - N/A as she is on heparin gtt  Level 3 H&P

## 2011-06-04 NOTE — ED Notes (Signed)
Pt report given and care endorsed to [place], [place]

## 2011-06-04 NOTE — ED Notes (Signed)
Pts HR dropped to 50s. EDP made aware. No new orders given at this time

## 2011-06-04 NOTE — ED Notes (Signed)
Attempted to call report. Secretary states primary Water engineer are both busy.

## 2011-06-04 NOTE — H&P (View-Only) (Signed)
Patient ID: Kara Lamb, female   DOB: 08/27/1955, 55 y.o.   MRN: [id number]     SUBJECTIVE:  Still with ongoing chest pain this morning radiating up towards her shoulder.  She says it is different from her chronic pattern of chest pain and is worrisome for the pain prior to getting stents.  Morphine and NTG have not helped the pain.  BP is better this morning.    Filed Vitals:   06/04/11 0430 06/04/11 0500 06/04/11 0526 06/04/11 0710  BP: 95/60  102/66 102/66  Pulse: 69  75 62  Temp:   97.5 F (36.4 C) 97.5 F (36.4 C)  TempSrc:   Oral Oral  Resp:   16 18  Height:  5' 1" (1.549 m)    Weight:  83.7 kg (184 lb 8.4 oz)    SpO2: 96%  97% 97%   No intake or output data in the 24 hours ending 06/04/11 0734  LABS: Basic Metabolic Panel:  Basename 06/03/11 2344 06/03/11 2304  NA 142 138  K 3.6 3.4*  CL 106 104  CO2 -- 26  GLUCOSE 135* 137*  BUN 15 14  CREATININE 0.90 0.69  CALCIUM -- 9.6  MG -- --  PHOS -- --   Liver Function Tests: No results found for this basename: AST:2,ALT:2,ALKPHOS:2,BILITOT:2,PROT:2,ALBUMIN:2 in the last 72 hours No results found for this basename: LIPASE:2,AMYLASE:2 in the last 72 hours CBC:  Basename 06/03/11 2344 06/03/11 2304  WBC -- 7.9  NEUTROABS -- --  HGB 12.9 12.6  HCT 38.0 37.5  MCV -- 98.4  PLT -- 221    Past Medical History  Diagnosis  Date  .  CAD (coronary artery disease)  s/p BMS to RCA and LCX 2008. In-stent restenosis of RCA stent rx'd with Xience DES 12/09. Requires extensive sedation for cath. cath 11/10: LAD mild plaque. LCX 10% RCA 50% ISR (FFR 0.91/0.92) Distal RCA 40/50. Cath 2/12: LAD 20-30; CFX 20-30, stent with 20-30; RCA stent with 30-40 and dRCA 30-40; EF 55-60%  .  Chronic chest pain  .  Anxiety and depression  .  H/O: suicide attempt  drug overdose 3/08  .  Hypertension  .  Obesity  .  Tobacco abuse  .  H/O ETOH abuse  .  Acid reflux  .  Arthritis  .  Hyperlipidemia  .  Pneumonia  .    UTI (lower urinary tract infection)  .  Colitis  .  GERD (gastroesophageal reflux disease)  .  COPD (chronic obstructive pulmonary disease)    RADIOLOGY: Dg Chest 2 View  06/03/2011  *RADIOLOGY REPORT*  Clinical Data: Chest pain  CHEST - 2 VIEW  Comparison: 03/04/2011  Findings: Mild linear left lung base opacity may represent minimal scarring or atelectasis.  Otherwise clear lungs.  No pleural effusion or pneumothorax.  Stable cardiomediastinal contours, within normal limits.  No acute osseous abnormality with multilevel degenerative changes.  IMPRESSION: No acute process identified.  Original Report Authenticated By: ANDREW J. DELGAIZO, M.D.    PHYSICAL EXAM General: NAD Neck: No JVD, no thyromegaly or thyroid nodule.  Lungs: Clear to auscultation bilaterally with normal respiratory effort. CV: Nondisplaced PMI.  Heart regular S1/S2, no S3/S4, no murmur.  No peripheral edema.  No carotid bruit.  Normal pedal pulses.  Abdomen: Soft, nontender, no hepatosplenomegaly, no distention.  Neurologic: Alert and oriented x 3.  Psych: Normal affect. Extremities: No clubbing or cyanosis.   ASSESSMENT AND PLAN:  55 yo with history of CAD presents   with prolonged chest pain and hypotension.  She became hypotensive after forgetting to cut back on her Benicar dose when she started Imdur.  She had a cath with nonobstructive disease in 2/12 and a non-ischemic myoview in 9/12.   1. Chest pain: Unremitting despite narcotics.  Seems to be related to hypotensive episode likely from meds (SBP was in 80s).  Initial troponin negative despite hours of pain and ECG unremarkable, repeat cardiac enzymes pending.  She has chronic chest pain but is very worried that this chest pain is different and feels like pain before her stent in the past.  Microvascular angina could certainly be possible.  - Cycle cardiac enzymes - Probably best way to resolve the issue here given ongoing severe pain and prior h/o CAD is  going to be left heart cath.  I will arrange for this am.  2. Hypotension: Suspect due to taking Benicar without lowering dose at same time as Imdur (new med).  I will have her stop Benicar as her SBP is only running in the 100s.   Cherrill Scrima 06/04/2011 7:41 AM   

## 2011-06-04 NOTE — Interval H&P Note (Signed)
History and Physical Interval Note:  06/04/2011 2:44 PM  The patient is here today for a left heart catheterization with possible PCI. I have personally reviewed the labs and examined the patient. The History and Physical has been reviewed and there are no changes. The risks and benefits of the procedure have been reviewed with the patient. Informed consent has been signed by the patient and is present in the chart. The patient agrees to proceed with the procedure.    Roston Grunewald

## 2011-06-04 NOTE — ED Notes (Signed)
Pt to room 20.  Physician with internal medicine at bedside.  Pt c/o nausea without emesis and pain at 7/10. A/O x 3. PEARL

## 2011-06-04 NOTE — ED Notes (Addendum)
Gave Report to Tia RN in yellow. No question and/or concerns.. Transported to [place]

## 2011-06-05 ENCOUNTER — Encounter (HOSPITAL_COMMUNITY): Payer: Self-pay | Admitting: Nurse Practitioner

## 2011-06-05 ENCOUNTER — Other Ambulatory Visit: Payer: Self-pay

## 2011-06-05 DIAGNOSIS — R079 Chest pain, unspecified: Secondary | ICD-10-CM

## 2011-06-05 NOTE — Discharge Summary (Signed)
Patient ID: Kara Lamb,  MRN: [id number], DOB/AGE: 1956/06/05 55 y.o.  Admit date: 06/03/2011 Discharge date: 06/05/2011   Primary Cardiologist: [facility]  Discharge Diagnoses Principal Problem:  *CHEST PAIN UNSPECIFIED Active Problems:  HYPERLIPIDEMIA-MIXED  TOBACCO ABUSE  HYPERTENSION, BENIGN  CAD, NATIVE VESSEL  COPD  GERD  Bipolar 1 disorder   Allergies No Known Allergies  Procedures  06/04/2011 - Cardiac Catheterization Angiographic Findings:  Left main: No disease.  Left Anterior Descending Artery: Mild 20% plaque throughout. Small caliber Diagonal branch with 50% stenosis proximally.  Circumflex Artery: Mid 40% stenosis. Mild plaque OM1.  Right Coronary Artery: Proximal stent patent with minimal restenosis. Mid stent with 60% in stent restenosis. Distal vessel with 20% plaque. PDA and PLA with mild plaque. The baseline FFR was 1.0. With infusion of IV adenosine, the FFR was 0.98. Left Ventricular Angiogram: LVEF 55%.  Impression:  1. Single vessel CAD with moderate in stent restenosis in the RCA stent. This is not flow limiting by FFR.  2. Moderate disease in the LAD and Circumflex.  3. Preserved LF function.    History of Present Illness  55 y/o female with the above problem list.  She presented to the [facility] ED on 12/21 with complaints of chest discomfort, headache, and dizziness that started after taking both benicar and imdur.  In the ED, her ECG was non-acute and CE were negative.  She was admitted for further eval.  Hospital Course   Pt cont to have chest pain but r/o for MI.  Decision was made to pursue cath, which took place on 12/21 and revealed nonobstructive disease.  Fractional Flow Reserve was performed within the RCA and was within normal limits.  Post-cath, pt has cont to have constant chest pain, though this has improved some.  She does have a h/o gerd and barrett's esophagus and she has been maintained on PPI with recommendation to f/u with GI  in the outpt setting.  As her BP remained soft, we have discontinued Benicar.  She will be d/c today in good condition.  Discharge Vitals:  Blood pressure 131/70, pulse 63, temperature 98.1 F (36.7 C), temperature source Oral, resp. rate 16, height 5\' 1"  (1.549 m), weight 185 lb 3 oz (84 kg), SpO2 96.00%.    Labs: CBC:  Basename 06/03/11 2344 06/03/11 2304  WBC -- 7.9  NEUTROABS -- --  HGB 12.9 12.6  HCT 38.0 37.5  MCV -- 98.4  PLT -- 221   Basic Metabolic Panel:  Basename 06/03/11 2344 06/03/11 2304  NA 142 138  K 3.6 3.4*  CL 106 104  CO2 -- 26  GLUCOSE 135* 137*  BUN 15 14  CREATININE 0.90 0.69  CALCIUM -- 9.6  MG -- --  PHOS -- --   Cardiac Enzymes:  Basename 06/04/11 1213 06/04/11 0539  CKTOTAL 111 95  CKMB 2.0 2.1  CKMBINDEX -- --  TROPONINI <0.30 <0.30    Disposition:  Follow-up Information    Follow up with Sanda Linger, MD.      Follow up with Verne Carrow, MD. (3-4 weeks - call for appt.)    Contact information:   [facility] [address] [place] [address] [phone]          Discharge Medications: Current Discharge Medication List    CONTINUE these medications which have NOT CHANGED   Details  albuterol (PROVENTIL HFA;VENTOLIN HFA) 108 (90 BASE) MCG/ACT inhaler Inhale 2 puffs into the lungs every 4 (four) hours as  needed. For shortness of breath     Ascorbic Acid (VITAMIN C) 1000 MG tablet Take 1,000 mg by mouth daily.      aspirin 81 MG tablet Take 81 mg by mouth daily.     atorvastatin (LIPITOR) 80 MG tablet Take 80 mg by mouth daily.      clonazePAM (KLONOPIN) 1 MG tablet Take 1 mg by mouth 3 (three) times daily as needed. For anxiety    clopidogrel (PLAVIX) 75 MG tablet Take 1 tablet (75 mg total) by mouth daily. Qty: 30 tablet, Refills: 11    DULoxetine (CYMBALTA) 60 MG capsule Take 60 mg by mouth 2 (two) times daily.      esomeprazole (NEXIUM) 40 MG capsule Take 40 mg by mouth  daily before breakfast.      fish oil-omega-3 fatty acids 1000 MG capsule Take 2 g by mouth 2 (two) times daily.     gabapentin (NEURONTIN) 400 MG capsule Take 400 mg by mouth at bedtime.      Glucos-MSM-C-Mn-Ginger-Willow (GLUCOSAMINE MSM COMPLEX) TABS Take 1 tablet by mouth 3 (three) times daily.     isosorbide mononitrate (IMDUR) 30 MG 24 hr tablet Take 30 mg by mouth daily.      loratadine (CLARITIN) 10 MG tablet Take 10 mg by mouth daily.      Multiple Vitamins-Minerals (MULTIVITAMIN WITH MINERALS) tablet Take 1 tablet by mouth daily.     !! OVER THE COUNTER MEDICATION Raspberry ketones 100 mg 1 tab twice a day     !! OVER THE COUNTER MEDICATION Take 1 drop by mouth daily. Vitamin B complex liquiod / b 12 sublingual  -1 dropper full daily    !! OVER THE COUNTER MEDICATION Take 1 tablet by mouth daily. calicum 1200 mg 1 tab daily    !! OVER THE COUNTER MEDICATION Take 1 tablet by mouth daily. Vitamin D3  5000 iu 1 tab am    tiotropium (SPIRIVA) 18 MCG inhalation capsule Place 18 mcg into inhaler and inhale daily.      topiramate (TOPAMAX) 100 MG tablet Take 100-150 mg by mouth 2 (two) times daily. 1 every am and 1 1/2 every pm    traZODone (DESYREL) 100 MG tablet Take 100 mg by mouth at bedtime.     nitroGLYCERIN (NITROSTAT) 0.4 MG SL tablet Place 0.4 mg under the tongue every 5 (five) minutes as needed. For chest pain     !! - Potential duplicate medications found. Please discuss with provider.    STOP taking these medications     olmesartan (BENICAR) 5 MG tablet         Outstanding Labs/Studies  None  Duration of Discharge Encounter: Greater than 30 minutes including physician time.  Signed, Nicolasa Ducking NP 06/05/2011, 9:21 AM

## 2011-06-05 NOTE — Progress Notes (Signed)
Patient Name: Hadassah Rana Date of Encounter: 06/05/2011     Principal Problem:  *CHEST PAIN UNSPECIFIED Active Problems:  HYPERLIPIDEMIA-MIXED  TOBACCO ABUSE  HYPERTENSION, BENIGN  CAD, NATIVE VESSEL  COPD  GERD  Bipolar 1 disorder    SUBJECTIVE:  Cont to have c/p over past 24 hrs.  Nothing seems to make it better or worse.  Described as focal lower sternal discomfort.  Also w/ cough and assoc pleuritic c/p - different from presenting Ss.   CURRENT MEDS    . aspirin EC  81 mg Oral Daily  . bivalirudin      . clopidogrel  75 mg Oral Q breakfast  . DULoxetine  60 mg Oral BID  . famotidine      . fentaNYL      . gabapentin  400 mg Oral QHS  . heparin      . isosorbide mononitrate  15 mg Oral Daily  . lidocaine      . loratadine  10 mg Oral Daily  . midazolam      . midazolam      . nitroGLYCERIN      . omega-3 acid ethyl esters  2 g Oral BID WC  . pantoprazole  40 mg Oral Q1200  . potassium chloride SA  40 mEq Oral BID  . promethazine  12.5 mg Intravenous To Minor  . rosuvastatin  40 mg Oral Daily  . tiotropium  18 mcg Inhalation Daily  . topiramate  100 mg Oral Daily  . topiramate  150 mg Oral QHS  . traZODone  100 mg Oral QHS  . DISCONTD: adenosine  16 mL Intravenous To Cath  . DISCONTD: aspirin  324 mg Oral Pre-Cath  . DISCONTD: fish oil-omega-3 fatty acids  2 g Oral BID WC  . DISCONTD: sodium chloride  3 mL Intravenous Q12H    OBJECTIVE  Filed Vitals:   06/04/11 2053 06/05/11 0015 06/05/11 0435 06/05/11 0738  BP: 104/59 88/56 121/61 131/70  Pulse: 67 73 65 63  Temp: 97.6 F (36.4 C) 98.1 F (36.7 C) 97.7 F (36.5 C) 98.1 F (36.7 C)  TempSrc: Oral Oral Oral Oral  Resp: 19 14 8 16   Height:      Weight:  185 lb 3 oz (84 kg)    SpO2: 97% 96% 99% 96%    Intake/Output Summary (Last 24 hours) at 06/05/11 8119 Last data filed at 06/04/11 1900  Gross per 24 hour  Intake 1196.25 ml  Output      0 ml  Net 1196.25 ml   Weight change: 10.6  oz (0.3 kg)  PHYSICAL EXAM  General: Well developed, well nourished, in no acute distress. Head: Normocephalic, atraumatic, sclera non-icteric, no xanthomas, nares are without discharge.  Neck: Supple without bruits or JVD. Lungs:  Resp regular and unlabored, CTA. Heart: RRR no s3, s4, or murmurs. Abdomen: Soft, non-tender, non-distended, BS + x 4.  Msk:  Strength and tone appears normal for age. Extremities: No clubbing, cyanosis or edema. DP/PT/Radials 2+ and equal bilaterally.  R groin w/o bleeding/bruit/hematoma. Neuro: Alert and oriented X 3. Moves all extremities spontaneously. Psych: Normal affect.  LABS:  CBC:  Basename 06/03/11 2344 06/03/11 2304  WBC -- 7.9  NEUTROABS -- --  HGB 12.9 12.6  HCT 38.0 37.5  MCV -- 98.4  PLT -- 221   Basic Metabolic Panel:  Basename 06/03/11 2344 06/03/11 2304  NA 142 138  K 3.6 3.4*  CL 106 104  CO2 --  26  GLUCOSE 135* 137*  BUN 15 14  CREATININE 0.90 0.69  CALCIUM -- 9.6  MG -- --  PHOS -- --  Cardiac Enzymes:  Basename 06/04/11 1213 06/04/11 0539  CKTOTAL 111 95  CKMB 2.0 2.1  CKMBINDEX -- --  TROPONINI <0.30 <0.30     TELE  RSR   Radiology/Studies:  Dg Chest 2 View  06/03/2011  *RADIOLOGY REPORT*  Clinical Data: Chest pain  CHEST - 2 VIEW  Comparison: 03/04/2011  Findings: Mild linear left lung base opacity may represent minimal scarring or atelectasis.  Otherwise clear lungs.  No pleural effusion or pneumothorax.  Stable cardiomediastinal contours, within normal limits.  No acute osseous abnormality with multilevel degenerative changes.  IMPRESSION: No acute process identified.  Original Report Authenticated By: Waneta Martins, M.D.   06/04/2011 - Cath Angiographic Findings:  Left main:  No disease.  Left Anterior Descending Artery: Mild 20% plaque throughout. Small caliber Diagonal branch with 50% stenosis proximally.   Circumflex Artery: Mid 40% stenosis. Mild plaque OM1.   Right Coronary Artery:  Proximal stent patent with minimal restenosis. Mid stent with 60% in stent restenosis. Distal vessel with 20% plaque. PDA and PLA with mild plaque.   Left Ventricular Angiogram: LVEF 55%.    Impression:   1. Single vessel CAD with moderate in stent restenosis in the RCA stent. This is not flow limiting by FFR.   2. Moderate disease in the LAD and Circumflex.   3. Preserved LF function.    ASSESSMENT AND PLAN:  1.  Acute on Chronic Chest Pain:  Ongoing w/o evidence of ischemia.  Cath showed nonobs dzs.  Plan d/c today.  Cont home meds w/ exception of  ARB 2/2 soft bp's.  Add PPI.  Pt to f/u w/ GI as outpt.  2.  HTN:  As above.  3.  HL:  Cont statin.  4.  Genella Rife w/ h/o Barrett's Esoph:  F/U GI.  Used to see D. Juanda Chance but thinks she wants to see [facility] this time.  Add PPI.    Signed, Nicolasa Ducking NP

## 2011-06-07 ENCOUNTER — Telehealth: Payer: Self-pay | Admitting: Cardiovascular Disease

## 2011-06-07 MED ORDER — ISOSORBIDE MONONITRATE 15 MG HALF TABLET: 15.0000 mg | ORAL_TABLET | Freq: Every day | ORAL | Status: DC

## 2011-06-07 MED FILL — Dextrose Inj 5%: INTRAVENOUS | Qty: 50 | Status: AC

## 2011-06-07 NOTE — Telephone Encounter (Signed)
Agree. cdm 

## 2011-06-07 NOTE — Telephone Encounter (Signed)
Patient called, phone rings busy.

## 2011-06-07 NOTE — Telephone Encounter (Signed)
Per pt call her b/p is really low and she has been dizzy and nausea and she feels the imdur 30mg  is to strong her b/p has been running 90/60 - 80/48 she is not gonna take medication today and she will be waiting on your call.

## 2011-06-07 NOTE — Telephone Encounter (Signed)
Patient called, stating she came home from hospital this past Saturday.Blood Pressure has been low ranging 92/60,85/48,has felt dizzy,nausea.States taking Imdur 30 mg daily,thinks that is too strong.Wants to know if she can decrease imdur.Fowared to Dr. Clifton James.

## 2011-06-07 NOTE — Telephone Encounter (Signed)
Patient was called back,spoke to Dr. Clifton James he advised ok to decrease Imdur to 15 mg daily.Advised to call back if not better.

## 2011-06-14 NOTE — Discharge Summary (Signed)
See progress notes Kara Lamb  

## 2011-06-15 HISTORY — PX: ROTATOR CUFF REPAIR: SHX139

## 2011-06-16 ENCOUNTER — Encounter (HOSPITAL_COMMUNITY): Payer: Self-pay | Admitting: Psychiatry

## 2011-06-16 ENCOUNTER — Encounter (HOSPITAL_COMMUNITY): Payer: Self-pay

## 2011-06-16 ENCOUNTER — Emergency Department (HOSPITAL_COMMUNITY)
Admission: EM | Admit: 2011-06-16 | Discharge: 2011-06-16 | Disposition: A | Discharge status: Other Acute Inpatient Hospital | Payer: Medicare Other | Source: Home / Self Care

## 2011-06-16 ENCOUNTER — Other Ambulatory Visit: Payer: Self-pay | Admitting: Internal Medicine

## 2011-06-16 ENCOUNTER — Inpatient Hospital Stay (HOSPITAL_COMMUNITY)
Admission: AD | Admit: 2011-06-16 | Discharge: 2011-06-23 | DRG: 885 | Disposition: A | Discharge status: Home / Self Care | Payer: Medicare Other | Source: Ambulatory Visit | Attending: Psychiatry | Admitting: Psychiatry

## 2011-06-16 ENCOUNTER — Ambulatory Visit (HOSPITAL_COMMUNITY)
Admission: RE | Admit: 2011-06-16 | Discharge: 2011-06-16 | Disposition: A | Discharge status: Home / Self Care | Payer: Medicare Other | Source: Home / Self Care | Attending: Psychiatry | Admitting: Psychiatry

## 2011-06-16 DIAGNOSIS — E669 Obesity, unspecified: Secondary | ICD-10-CM

## 2011-06-16 DIAGNOSIS — E785 Hyperlipidemia, unspecified: Secondary | ICD-10-CM

## 2011-06-16 DIAGNOSIS — K219 Gastro-esophageal reflux disease without esophagitis: Secondary | ICD-10-CM

## 2011-06-16 DIAGNOSIS — Z79899 Other long term (current) drug therapy: Secondary | ICD-10-CM

## 2011-06-16 DIAGNOSIS — F411 Generalized anxiety disorder: Secondary | ICD-10-CM

## 2011-06-16 DIAGNOSIS — F4310 Post-traumatic stress disorder, unspecified: Secondary | ICD-10-CM

## 2011-06-16 DIAGNOSIS — Z9861 Coronary angioplasty status: Secondary | ICD-10-CM

## 2011-06-16 DIAGNOSIS — F429 Obsessive-compulsive disorder, unspecified: Secondary | ICD-10-CM

## 2011-06-16 DIAGNOSIS — Z7982 Long term (current) use of aspirin: Secondary | ICD-10-CM

## 2011-06-16 DIAGNOSIS — F603 Borderline personality disorder: Secondary | ICD-10-CM

## 2011-06-16 DIAGNOSIS — J4489 Other specified chronic obstructive pulmonary disease: Secondary | ICD-10-CM

## 2011-06-16 DIAGNOSIS — I2510 Atherosclerotic heart disease of native coronary artery without angina pectoris: Secondary | ICD-10-CM

## 2011-06-16 DIAGNOSIS — R45851 Suicidal ideations: Secondary | ICD-10-CM

## 2011-06-16 DIAGNOSIS — F319 Bipolar disorder, unspecified: Principal | ICD-10-CM

## 2011-06-16 DIAGNOSIS — I10 Essential (primary) hypertension: Secondary | ICD-10-CM

## 2011-06-16 DIAGNOSIS — M129 Arthropathy, unspecified: Secondary | ICD-10-CM

## 2011-06-16 DIAGNOSIS — J449 Chronic obstructive pulmonary disease, unspecified: Secondary | ICD-10-CM

## 2011-06-16 DIAGNOSIS — R079 Chest pain, unspecified: Secondary | ICD-10-CM

## 2011-06-16 DIAGNOSIS — IMO0002 Reserved for concepts with insufficient information to code with codable children: Secondary | ICD-10-CM

## 2011-06-16 DIAGNOSIS — F17200 Nicotine dependence, unspecified, uncomplicated: Secondary | ICD-10-CM

## 2011-06-16 LAB — URINALYSIS, ROUTINE W REFLEX MICROSCOPIC
Ketones, ur: NEGATIVE mg/dL
Leukocytes, UA: NEGATIVE
Nitrite: NEGATIVE
Protein, ur: NEGATIVE mg/dL
Urobilinogen, UA: 0.2 mg/dL (ref 0.0–1.0)

## 2011-06-16 LAB — COMPREHENSIVE METABOLIC PANEL
ALT: 27 U/L (ref 0–35)
AST: 30 U/L (ref 0–37)
Albumin: 4.3 g/dL (ref 3.5–5.2)
Calcium: 9.8 mg/dL (ref 8.4–10.5)
Sodium: 141 mEq/L (ref 135–145)
Total Protein: 7.5 g/dL (ref 6.0–8.3)

## 2011-06-16 LAB — CBC
MCH: 32.8 pg (ref 26.0–34.0)
MCHC: 33.5 g/dL (ref 30.0–36.0)
Platelets: 249 10*3/uL (ref 150–400)
RBC: 4.08 MIL/uL (ref 3.87–5.11)
RDW: 12.1 % (ref 11.5–15.5)

## 2011-06-16 LAB — ACETAMINOPHEN LEVEL: Acetaminophen (Tylenol), Serum: <15 ug/mL (ref 10–30)

## 2011-06-16 MED ORDER — ALUM & MAG HYDROXIDE-SIMETH 200-200-20 MG/5ML PO SUSP: 30.0000 mL | ORAL | Status: DC | PRN

## 2011-06-16 MED ORDER — TRAZODONE HCL 100 MG PO TABS
100.0000 mg | ORAL_TABLET | Freq: Every day | ORAL | Status: DC
  Administered 2011-06-16: 100 mg via ORAL
  Filled 2011-06-16 (×3): qty 1

## 2011-06-16 MED ORDER — ACETAMINOPHEN 325 MG PO TABS
650.0000 mg | ORAL_TABLET | Freq: Four times a day (QID) | ORAL | Status: DC | PRN
  Administered 2011-06-17 – 2011-06-19 (×5): 650 mg via ORAL

## 2011-06-16 MED ORDER — MAGNESIUM HYDROXIDE 400 MG/5ML PO SUSP: 30.0000 mL | Freq: Every day | ORAL | Status: DC | PRN

## 2011-06-16 MED ORDER — ESOMEPRAZOLE MAGNESIUM 40 MG PO CPDR: 40.0000 mg | DELAYED_RELEASE_CAPSULE | Freq: Every day | ORAL | Status: DC
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
## 2011-06-16 NOTE — ED Notes (Signed)
Report given to Noel at [facility]. 

## 2011-06-16 NOTE — ED Notes (Signed)
Naisa from [facility] has called back. States pt accepted by Serena Colonel, PA & going to RM [phone].  Report needs to be called to [phone] and to fax the voluntary admission paper (signed by patient) to [phone]-which has been done.

## 2011-06-16 NOTE — ED Notes (Signed)
Spoke to Kara Lamb at [facility].  States once all labs are back, call them back so the psych MD can review them then he/she will give the ok for pt to come over there.  Pt is resting; calm and cooperative w/the sitter.

## 2011-06-16 NOTE — Tx Team (Signed)
Initial Interdisciplinary Treatment Plan  PATIENT STRENGTHS: (choose at least two) Average or above average intelligence Capable of independent living Communication skills  PATIENT STRESSORS: Health problems Marital or family conflict   PROBLEM LIST: Problem List/Patient Goals Date to be addressed Date deferred Reason deferred Estimated date of resolution  Depression                                                       DISCHARGE CRITERIA:  Ability to meet basic life and health needs Adequate post-discharge living arrangements Improved stabilization in mood, thinking, and/or behavior Motivation to continue treatment in a less acute level of care Need for constant or close observation no longer present Reduction of life-threatening or endangering symptoms to within safe limits Verbal commitment to aftercare and medication compliance  PRELIMINARY DISCHARGE PLAN: Outpatient therapy Return to previous living arrangement  PATIENT/FAMIILY INVOLVEMENT: This treatment plan has been presented to and reviewed with the patient, Kara Lamb, and/or family member.  The patient and family have been given the opportunity to ask questions and make suggestions.  Dyke Maes 06/16/2011, 8:11 PM

## 2011-06-16 NOTE — Telephone Encounter (Signed)
Asked patient if she takes Nexium daily as it appears the last time it was filled was in June 2012. Patient states that she has been hospitalized multiple times and is given a 1 month prescription of medications so that is why she has not gotten rx filled by [place] in a while. She states that she does take her medication every day. I will send new rx to the pharmacy.

## 2011-06-16 NOTE — ED Notes (Signed)
Security called to transport pt across street w/EDT to accompany.

## 2011-06-16 NOTE — ED Notes (Signed)
Spoke w/Naisa ext [phone] at [facility] who knows all labs are back.  She will notify Tanna Savoy ext [phone] at [facility] who will call the MD on call.   She has requested pt to sign a voluntary admission form here before pt gets accepted.  Clydie Braun ACT team aware and will have this form signed by pt.
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
## 2011-06-17 DIAGNOSIS — F333 Major depressive disorder, recurrent, severe with psychotic symptoms: Secondary | ICD-10-CM

## 2011-06-17 MED ORDER — CLOPIDOGREL BISULFATE 75 MG PO TABS
75.0000 mg | ORAL_TABLET | Freq: Every day | ORAL | Status: DC
  Filled 2011-06-17: qty 1

## 2011-06-17 MED ORDER — ROSUVASTATIN CALCIUM 40 MG PO TABS
40.0000 mg | ORAL_TABLET | Freq: Every day | ORAL | Status: DC
  Administered 2011-06-17 – 2011-06-22 (×6): 40 mg via ORAL
  Filled 2011-06-17 (×7): qty 1

## 2011-06-17 MED ORDER — CLONAZEPAM 1 MG PO TABS
1.0000 mg | ORAL_TABLET | Freq: Three times a day (TID) | ORAL | Status: DC | PRN
  Administered 2011-06-17 – 2011-06-23 (×12): 1 mg via ORAL
  Filled 2011-06-17 (×12): qty 1

## 2011-06-17 MED ORDER — ALBUTEROL SULFATE HFA 108 (90 BASE) MCG/ACT IN AERS
2.0000 | INHALATION_SPRAY | RESPIRATORY_TRACT | Status: DC
  Administered 2011-06-17 – 2011-06-23 (×24): 2 via RESPIRATORY_TRACT
  Filled 2011-06-17: qty 6.7

## 2011-06-17 MED ORDER — ASPIRIN 81 MG PO TABS: 81.0000 mg | ORAL_TABLET | Freq: Every day | ORAL | Status: DC

## 2011-06-17 MED ORDER — CALCIUM CARBONATE 1250 (500 CA) MG PO TABS
1.0000 | ORAL_TABLET | Freq: Two times a day (BID) | ORAL | Status: DC
  Administered 2011-06-17 – 2011-06-20 (×7): 500 mg via ORAL
  Filled 2011-06-17 (×8): qty 1

## 2011-06-17 MED ORDER — ISOSORBIDE MONONITRATE 15 MG HALF TABLET
15.0000 mg | ORAL_TABLET | Freq: Every day | ORAL | Status: DC
  Filled 2011-06-17 (×3): qty 1

## 2011-06-17 MED ORDER — MENTHOL 3 MG MT LOZG
1.0000 | LOZENGE | OROMUCOSAL | Status: DC | PRN
  Administered 2011-06-18 – 2011-06-22 (×7): 3 mg via ORAL

## 2011-06-17 MED ORDER — OLMESARTAN MEDOXOMIL 5 MG PO TABS
5.0000 mg | ORAL_TABLET | Freq: Every day | ORAL | Status: DC
  Administered 2011-06-17 – 2011-06-23 (×7): 5 mg via ORAL
  Filled 2011-06-17 (×8): qty 1

## 2011-06-17 MED ORDER — PANTOPRAZOLE SODIUM 40 MG PO TBEC: 80.0000 mg | DELAYED_RELEASE_TABLET | Freq: Every day | ORAL | Status: DC

## 2011-06-17 MED ORDER — CLOPIDOGREL BISULFATE 75 MG PO TABS
75.0000 mg | ORAL_TABLET | Freq: Once | ORAL | Status: AC
  Administered 2011-06-17: 75 mg via ORAL

## 2011-06-17 MED ORDER — CLOPIDOGREL BISULFATE 75 MG PO TABS
75.0000 mg | ORAL_TABLET | ORAL | Status: DC
  Filled 2011-06-17 (×2): qty 1

## 2011-06-17 MED ORDER — SERTRALINE HCL 100 MG PO TABS
100.0000 mg | ORAL_TABLET | Freq: Every day | ORAL | Status: DC
  Administered 2011-06-18 – 2011-06-20 (×3): 100 mg via ORAL
  Filled 2011-06-17 (×4): qty 1

## 2011-06-17 MED ORDER — ASPIRIN 81 MG PO CHEW
81.0000 mg | CHEWABLE_TABLET | Freq: Once | ORAL | Status: DC
  Filled 2011-06-17: qty 1

## 2011-06-17 MED ORDER — GABAPENTIN 100 MG PO CAPS
200.0000 mg | ORAL_CAPSULE | Freq: Every day | ORAL | Status: AC
  Administered 2011-06-17 – 2011-06-18 (×2): 200 mg via ORAL
  Filled 2011-06-17 (×2): qty 2

## 2011-06-17 MED ORDER — NITROGLYCERIN 0.4 MG SL SUBL: 0.4000 mg | SUBLINGUAL_TABLET | SUBLINGUAL | Status: DC | PRN

## 2011-06-17 MED ORDER — LORATADINE 10 MG PO TABS
10.0000 mg | ORAL_TABLET | Freq: Every day | ORAL | Status: DC
  Filled 2011-06-17: qty 1

## 2011-06-17 MED ORDER — DULOXETINE HCL 60 MG PO CPEP: 60.0000 mg | ORAL_CAPSULE | Freq: Two times a day (BID) | ORAL | Status: DC

## 2011-06-17 MED ORDER — OMEGA-3-ACID ETHYL ESTERS 1 G PO CAPS
1.0000 g | ORAL_CAPSULE | Freq: Two times a day (BID) | ORAL | Status: DC
  Administered 2011-06-17 – 2011-06-19 (×4): 1 g via ORAL
  Filled 2011-06-17 (×8): qty 1

## 2011-06-17 MED ORDER — OMEGA-3 FATTY ACIDS 1000 MG PO CAPS: 2.0000 g | ORAL_CAPSULE | Freq: Two times a day (BID) | ORAL | Status: DC

## 2011-06-17 MED ORDER — CALCIUM CARBONATE 1250 (500 CA) MG PO TABS: 1.0000 | ORAL_TABLET | Freq: Two times a day (BID) | ORAL | Status: DC

## 2011-06-17 MED ORDER — ALBUTEROL SULFATE HFA 108 (90 BASE) MCG/ACT IN AERS
2.0000 | INHALATION_SPRAY | RESPIRATORY_TRACT | Status: DC | PRN
  Administered 2011-06-18 – 2011-06-22 (×4): 2 via RESPIRATORY_TRACT

## 2011-06-17 MED ORDER — PANTOPRAZOLE SODIUM 40 MG PO TBEC
80.0000 mg | DELAYED_RELEASE_TABLET | Freq: Every day | ORAL | Status: DC
  Administered 2011-06-17 – 2011-06-23 (×7): 80 mg via ORAL
  Filled 2011-06-17 (×7): qty 2

## 2011-06-17 MED ORDER — ASPIRIN EC 81 MG PO TBEC
81.0000 mg | DELAYED_RELEASE_TABLET | ORAL | Status: DC
  Administered 2011-06-18 – 2011-06-23 (×6): 81 mg via ORAL
  Filled 2011-06-17 (×6): qty 1

## 2011-06-17 MED ORDER — VITAMIN D3 25 MCG (1000 UNIT) PO TABS
5000.0000 [IU] | ORAL_TABLET | Freq: Every day | ORAL | Status: DC
  Administered 2011-06-18 – 2011-06-23 (×6): 5000 [IU] via ORAL
  Filled 2011-06-17 (×7): qty 5

## 2011-06-17 MED ORDER — TIOTROPIUM BROMIDE MONOHYDRATE 18 MCG IN CAPS
18.0000 ug | ORAL_CAPSULE | Freq: Every day | RESPIRATORY_TRACT | Status: DC
  Filled 2011-06-17: qty 5

## 2011-06-17 MED ORDER — TOPIRAMATE 100 MG PO TABS
100.0000 mg | ORAL_TABLET | Freq: Two times a day (BID) | ORAL | Status: DC
  Administered 2011-06-17 – 2011-06-23 (×12): 100 mg via ORAL
  Filled 2011-06-17 (×13): qty 1

## 2011-06-17 MED ORDER — VITAMIN D3 125 MCG (5000 UT) PO TABS: 5.0000 | ORAL_TABLET | ORAL | Status: DC

## 2011-06-17 MED ORDER — TIOTROPIUM BROMIDE MONOHYDRATE 18 MCG IN CAPS
18.0000 ug | ORAL_CAPSULE | RESPIRATORY_TRACT | Status: DC
  Administered 2011-06-18 – 2011-06-23 (×6): 18 ug via RESPIRATORY_TRACT

## 2011-06-17 MED ORDER — SIMVASTATIN 40 MG PO TABS
40.0000 mg | ORAL_TABLET | Freq: Every day | ORAL | Status: DC
  Filled 2011-06-17: qty 1

## 2011-06-17 MED ORDER — TRAZODONE HCL 100 MG PO TABS
200.0000 mg | ORAL_TABLET | Freq: Every day | ORAL | Status: DC
  Administered 2011-06-17: 200 mg via ORAL
  Filled 2011-06-17 (×2): qty 2

## 2011-06-17 MED ORDER — VITAMIN C 500 MG PO TABS
1000.0000 mg | ORAL_TABLET | ORAL | Status: DC
  Administered 2011-06-18 – 2011-06-23 (×6): 1000 mg via ORAL
  Filled 2011-06-17 (×6): qty 2

## 2011-06-17 MED ORDER — PANTOPRAZOLE SODIUM 40 MG PO TBEC
80.0000 mg | DELAYED_RELEASE_TABLET | Freq: Every day | ORAL | Status: DC
  Filled 2011-06-17: qty 2

## 2011-06-17 MED ORDER — LORATADINE 10 MG PO TABS
10.0000 mg | ORAL_TABLET | ORAL | Status: DC
  Administered 2011-06-18 – 2011-06-23 (×6): 10 mg via ORAL
  Filled 2011-06-17 (×6): qty 1

## 2011-06-17 MED ORDER — THERA M PLUS PO TABS
1.0000 | ORAL_TABLET | ORAL | Status: DC
  Administered 2011-06-18 – 2011-06-23 (×6): 1 via ORAL
  Filled 2011-06-17 (×6): qty 1
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
## 2011-06-17 NOTE — Progress Notes (Signed)
Pantoprazole may lead to reduced ability of Clopidogrel to inhibit platelet aggregation and increase the risk of subsequent cardiovascular events.   Coadministration of Pantoprazole and Clopidogrel should be avoided according to the official package labeling of Clopidogrel. However, according to an expert consensus document the benefits of Pantoprazole may outweigh the risk of the potential reduction in cardiovascular efficacy in patients receiving Clopidogrel who are at high risk for gastrointestinal bleeding.  The patient was on Esomeprazole(Nexium) prior to admission. The interaction between this PPI and Clopidogrel may be more significant.  If a PPI needs to continue upon discharge, consider prescribing Pantoprazole rather than Esomeprazole(Nexium).   Charolotte Eke, PharmD, pager [phone]. 06/17/2011,4:29 PM.

## 2011-06-17 NOTE — Progress Notes (Signed)
[facility] Group Notes: (Counselor/Nursing/MHT/Case Management/Adjunct) 06/17/11@11 :00am  Type of Therapy:  Group Therapy  Participation Level:  Minimal  Participation Quality:  Drowsy  Affect:  Depressed  Cognitive:  Appropriate  Insight:  Limited  Engagement in Group: Limited  Engagement in Therapy:  Minimal  Modes of Intervention:  Support and Exploration  Summary of Progress/Problems:  Kara Lamb was quiet during the beginning part of group. She seemed to be attentive though she was lying with her head down on the chair. Near the end of group she spoke up about another group member's experience with church people saying she would not be mentally ill if she had more faith in God. Kara Lamb stated that she finds this scary and disgusting, pointing out that mental illness is a sickness like other physical illnesses. She stated that some people hold on to the old way of thinking and use it to hurt others, sharing that her mother has said to others that Kara Lamb and her twin sister, "have just been so mentally messed up for so long now that I know they will never get better."  Kara Lamb 06/17/2011  3:20 PM
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
## 2011-06-17 NOTE — Progress Notes (Signed)
Adult Psychosocial Assessment Update Interdisciplinary Team  Previous [facility] Hospital admissions/discharges:  Admissions Discharges  Date: 03/04/11 Date: 03/12/11  Date: 03/02/11 Date: 03/04/11  Date:  11/20/10 Date: 11/28/10  Date: Date:  Date: Date:   Changes since the last Psychosocial Assessment (including adherence to outpatient mental health and/or substance abuse treatment, situational issues contributing to decompensation and/or relapse). Kara Lamb has been continuing to have problems with her family. Had an argument with  Her mother that escalated. Still has no contact with oldest daughter and is unable to see  Her grandchildren. Other daughter recently got married and Kara Lamb was not invited to  The wedding and did not know about it until afterwards when she saw pictures from it on  Facebook. Kara Lamb felt rejected by her whole family and had suicidal thoughts. She  Told her therapist about them at her appointment and was sent to the hospital.   Discharge Plan 1. Will you be returning to the same living situation after discharge?   Yes:  X No:      If no, what is your plan?    Will return home where she lives alone       2. Would you like a referral for services when you are discharged? Yes:     If yes, for what services?  No:   X    Regularly sees Emmaline Life at [facility] for therapy, as well was a psychiatrist there       Summary and Recommendations (to be completed by the evaluator) Kara Lamb is a 56 year old divorced female diagnosed with Major Depressive Disorder  And Borderline Personality traits. She reports plan to overdose on her medications and  Had prepared to do so by calling to find out about prices for cremation and writing her   Own obituary. Kara Lamb would benefit from crisis stabilization, medication evaluation,   Therapy groups for processing thoughts/feelings/experiences, psychoed groups for   Coping skills and case management for discharge  planning.              Signature:  Billie Lade, 06/17/2011 8:52 AM

## 2011-06-17 NOTE — Progress Notes (Signed)
Patient ID: Kara Lamb, female   DOB: [birth year], 56 y.o.   MRN: [id number] Pt is awake and active on the unit this AM. Pt is attending groups and is cooperative with staff. However, she is very attention seeking and needy for staff time throughout the day. Pt denies HI and AVH but endorses passive SI with a plan to OD on pills when she goes home. Pt states that she has made plans to have herself cremated and has written her own obituary. Writer encouraged pt to participate in her aftercare program in order to cope with her issues with family and friends. Pt does contract for safety and that she would not harm herself while at [facility]. This is baseline for her according to MD. Writer will continue to monitor.

## 2011-06-17 NOTE — Tx Team (Signed)
Interdisciplinary Treatment Plan Update (Adult)  Date:  06/17/2011  Time Reviewed:  9:47 AM   Progress in Treatment: Attending groups:   No, not present for morning groups Participating in groups:  No Taking medication as prescribed:  Yes Tolerating medication:  Yes Family/Significant other contact made: No, counselor requesting consent  Patient understands diagnosis:  Yes Discussing patient identified problems/goals with staff: Yes Medical problems stabilized or resolved: Yes Denies suicidal/homicidal ideation:Yes Issues/concerns per patient self-inventory:  Other:  New problem(s) identified:  Reason for Continuation of Hospitalization: Depression Suicidal ideation  Interventions implemented related to continuation of hospitalization: Medication Management; safety checks q 15 mins, group therapy   Additional comments: treatment team sees suicidality as chronic, doctor to address this with [name] and explore what accute needs can be addressed  Estimated length of stay: 1-2 days  Discharge Plan: return home, continue following up with [facility]  New goal(s):  Review of initial/current patient goals per problem list:   1.  Goal(s): Decrease depressive symptoms  Met:  No  Target date: by discharge  As evidenced by: Kara Lamb is rating depression at a 9 (will decrease to 4-5 before discharge)  2.  Goal (s):Reduce probability of self-harm behaviors  Met:  No  Target date: by discharge  As evidenced by: Kara Lamb reports she still wants to die and plans to go through with overdose when discharged  3.  Goal(s):  Medication stabilization  Met:  No  Target date: by discharge  As evidenced by: Kara Lamb is not yet reporting any changes from medication  Attendees: Patient:     Family:     Physician:  .Orson Aloe, MD , MD 06/17/2011 9:47 AM   Nursing:   Izola Price, RN 06/17/2011 9:47 AM   CaseManager:  Juline Patch, LCSW 06/17/2011 9:47 AM   Counselor:  Angus Palms, LCSW 06/17/2011 9:47 AM   Other:   06/17/2011 9:47 AM   Other:     Other:     Other:      Scribe for Treatment Team:   Wynn Banker, LCSW,  06/17/2011 9:47 AM

## 2011-06-18 MED ORDER — TRAZODONE HCL 50 MG PO TABS
150.0000 mg | ORAL_TABLET | Freq: Every day | ORAL | Status: DC
  Filled 2011-06-18: qty 3

## 2011-06-18 MED ORDER — PSEUDOEPHEDRINE HCL 30 MG PO TABS: 30.0000 mg | ORAL_TABLET | ORAL | Status: DC | PRN

## 2011-06-18 MED ORDER — TRAZODONE HCL 50 MG PO TABS
150.0000 mg | ORAL_TABLET | Freq: Every day | ORAL | Status: DC
  Administered 2011-06-18 – 2011-06-20 (×3): 150 mg via ORAL
  Filled 2011-06-18 (×3): qty 3

## 2011-06-18 MED ORDER — CLOPIDOGREL BISULFATE 75 MG PO TABS
75.0000 mg | ORAL_TABLET | Freq: Once | ORAL | Status: AC
  Administered 2011-06-18: 75 mg via ORAL

## 2011-06-18 MED ORDER — LOPERAMIDE HCL 2 MG PO CAPS: 2.0000 mg | ORAL_CAPSULE | Freq: Once | ORAL | Status: DC

## 2011-06-18 MED ORDER — CHLORPROMAZINE HCL 25 MG PO TABS
25.0000 mg | ORAL_TABLET | Freq: Three times a day (TID) | ORAL | Status: DC | PRN
  Administered 2011-06-18: 25 mg via ORAL
  Filled 2011-06-18: qty 1

## 2011-06-18 NOTE — Progress Notes (Signed)
Patient ID: Kara Lamb, female   DOB: 04-08-1956, 56 y.o.   MRN: [id number] Pt reports 2good sleep and 2improving appetite.  Pt reports depression at 2 and hopelessness at 2.  Pt's energy level is low and ability to pay attention is poor. Pt endorses  suicidal ideation. She reports constant thoughts  And she does contract for safety.  Pt has a list of her medcations and checks them off as she receives them.  She wants to take her plavix in the evening and also want something to help her sore throat.  She is complaining of a headache as well
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
## 2011-06-18 NOTE — Progress Notes (Signed)
[facility] Group Notes:  (Counselor/Nursing/MHT/Case Management/Adjunct)  06/18/2011 3:08 PM  Type of Therapy:  Group Therapy  Participation Level:  Did Not Attend   Summary of Progress/Problems: [name], LPCA    Kordae Buonocore Garret Reddish 06/18/2011, 3:08 PM

## 2011-06-18 NOTE — Progress Notes (Signed)
[facility]patient Family/Significant Other Suicide Prevention Education  Suicide Prevention Education:  Education Completed; pt's best friend Lilyan Punt [phone] has been identified by the patient as the family member/significant other with whom the patient will be residing, and identified as the person(s) who will aid the patient in the event of a mental health crisis (suicidal ideations/suicide attempt).  With written consent from the patient, the family member/significant other has been provided the following suicide prevention education, prior to the and/or following the discharge of the patient.  The suicide prevention education provided includes the following:  Suicide risk factors  Suicide prevention and interventions  National Suicide Hotline telephone number  [facility] assessment telephone number  [facility] Emergency Assistance 911  [facility] and/or Residential Mobile Crisis Unit telephone number  Request made of family/significant other to:  Remove weapons (e.g., guns, rifles, knives), all items previously/currently identified as safety concern.    Remove drugs/medications (over-the-counter, prescriptions, illicit drugs), all items previously/currently identified as a safety concern.  The family member/significant other verbalizes understanding of the suicide prevention education information provided.  The family member/significant other agrees to remove the items of safety concern listed above. Pt's best friend stated she has no guns in the home and access to weapons. [name], LPCA   Royetta Probus Garret Reddish 06/18/2011, 4:38 PM

## 2011-06-18 NOTE — Tx Team (Addendum)
Interdisciplinary Treatment Plan Update (Adult)  Date:  06/18/2011  Time Reviewed:  11:11 AM   Progress in Treatment: Attending groups:   Yes   Participating in groups:  Yes Taking medication as prescribed:  Yes Tolerating medication:  Yes Family/Significant othe contact made: Patient advised of no involvement with family Patient understands diagnosis:  Yes Discussing patient identified problems/goals with staff: Yes Medical problems stabilized or resolved: Yes Denies suicidal/homicidal ideation:No -  Kara Lamb contracts for safety Issues/concerns per patient self-inventory:  Other:  New problem(s) identified:  Reason for Continuation of Hospitalization: Anxiety Depression Medication stabilization Suicidal ideation  Interventions implemented related to continuation of hospitalization: Medication Management; safety checks q 15 mins  Additional comments: Suicide prevention education reviewed  Estimated length of stay: 3-5 days  Discharge Plan:  Home with outpatient follow up at [facility]):  Review of initial/current patient goals per problem list:    1.  Goal(s):  .Eliminate SI   Met:  No  Target date: d/c  As evidenced by: Kara Lamb will no longer endorse SI and/or intent to act on thoughs  2.  Goal (s):  Reduce depression  Met:  No  Target date: d/c   As evidenced by: Depression will be rated less than current rating of nine  3.  Goal(s):  Stabilize on medications  Met:  No  Target date: d/c  As evidenced by:  Kara Lamb will report decreased symptoms -medications working  4.  Goal(s):  Refer for outpatient services  Met:  No  Target date: d/c  As evidenced by: Follow up appointment will be scheduled with [facility]  Attendees: Patient:  Kara Lamb 06/18/2011 11:12 AM   Family:     Physician:  Orson Aloe, MD 06/18/2011 11:11 AM   Nursing:   Neill Loft, RN 06/18/2011 11:11 AM   CaseManager:  Juline Patch, LCSW 06/18/2011 11:11 AM     Counselor:   06/18/2011 11:11 AM   Other:  Consuello Bossier, NP 06/18/2011 11:11 AM   Other:  Reyes Ivan, LCSWA 06/18/2011  11:13 AM   Other:     Other:      Scribe for Treatment Team:   Wynn Banker, LCSW,  06/18/2011 11:11 AM

## 2011-06-18 NOTE — Progress Notes (Signed)
Pt seen during treatment team.  She explained why she felt that the diagnosis of Borderline Personality does not apply to her.  SHe had obsessed about that from my conversation with her yesterday.  She stated that she will still consider that, but steadfastly denies that it applies to her at this time.  When the nurse practitioner explained to her that some people respond to a family rejection as oh well they don't want me around but some people are drama queens and have over the top reactions to family rejection with suicide attempts.

## 2011-06-18 NOTE — Progress Notes (Signed)
Patient ID: Kara Lamb, female   DOB: September 11, 1955, 56 y.o.   MRN: [id number]  Patient asleep in bed, laying on back. Patient has towel covering eyes but patient is quietly snoring. Appears in no distress. Respirations even, normal and unlabored. Continue to monitor q15 minutes to ensure patient safety while on the unit.

## 2011-06-18 NOTE — Progress Notes (Signed)
[facility] Group Notes:  (Counselor/Nursing/MHT/Case Management/Adjunct)  06/18/2011 3:54 PM  Type of Therapy:  Psychoeducational Skills  Participation Level:  Did Not Attend [name], LPCA    Emilija Bohman Garret Reddish 06/18/2011, 3:54 PM

## 2011-06-18 NOTE — Progress Notes (Addendum)
Patient seen during d/c planning group.  She endorses SI but contracts for safety. Kara Lamb rates depression at nine today, anxiety and hopelessness at eight and helplessness at ten. She advised of being followed by [facility] for outpatient services.  She plans to return to her home at discharge. Suicide prevention reviewed during discharge planning group.  Per State Regulation 482.30 This chart was reviewed for medical necessity with respect to the patient's  Admission/Duration of Stay  [facility], LCSW @1 /09/2011    Next Review Date 06/20/2010

## 2011-06-19 MED ORDER — OMEGA-3-ACID ETHYL ESTERS 1 G PO CAPS
2.0000 g | ORAL_CAPSULE | Freq: Two times a day (BID) | ORAL | Status: DC
  Administered 2011-06-19 – 2011-06-23 (×8): 2 g via ORAL
  Filled 2011-06-19 (×10): qty 2

## 2011-06-19 MED ORDER — HYDROCORTISONE 2.5 % RE CREA
TOPICAL_CREAM | Freq: Four times a day (QID) | RECTAL | Status: DC | PRN
  Filled 2011-06-19: qty 28.35

## 2011-06-19 MED ORDER — GUAIFENESIN ER 600 MG PO TB12
600.0000 mg | ORAL_TABLET | Freq: Two times a day (BID) | ORAL | Status: DC
  Administered 2011-06-19 – 2011-06-23 (×8): 600 mg via ORAL
  Filled 2011-06-19 (×11): qty 1

## 2011-06-19 MED ORDER — ACETAMINOPHEN 325 MG PO TABS
650.0000 mg | ORAL_TABLET | ORAL | Status: DC | PRN
  Administered 2011-06-19 – 2011-06-23 (×10): 650 mg via ORAL

## 2011-06-19 MED ORDER — HYDROCORTISONE 1 % EX CREA
TOPICAL_CREAM | Freq: Three times a day (TID) | CUTANEOUS | Status: DC
  Filled 2011-06-19: qty 28

## 2011-06-19 MED ORDER — CHLORPROMAZINE HCL 25 MG PO TABS
50.0000 mg | ORAL_TABLET | Freq: Three times a day (TID) | ORAL | Status: DC | PRN
  Administered 2011-06-19 – 2011-06-20 (×2): 50 mg via ORAL
  Filled 2011-06-19 (×3): qty 2

## 2011-06-19 MED ORDER — CLOPIDOGREL BISULFATE 75 MG PO TABS
75.0000 mg | ORAL_TABLET | Freq: Every day | ORAL | Status: DC
  Administered 2011-06-19 – 2011-06-22 (×4): 75 mg via ORAL
  Filled 2011-06-19 (×6): qty 1

## 2011-06-19 NOTE — Progress Notes (Signed)
[facility] Group Notes:  (Counselor/Nursing/MHT/Case Management/Adjunct)  06/19/2011 3:41 PM  Type of Therapy:  Counseling Group  Participation Level:  Active  Participation Quality:  Appropriate  Affect:  Appropriate  Cognitive:  Appropriate  Insight:  Good  Engagement in Group:  Good  Engagement in Therapy:  Good  Modes of Intervention:  Clarification and Support  Summary of Progress/Problems: Pt. Participated in group session on self sabotaging behaviors and enabling. Each pt. Identified their self sabotaging behaviors and how to positively enable themselves to make the changes they need in their life. Pt. Spoke about her struggle with Si thoughts and how her involvement with her family triggers her SI thoughts and attempts. Pt. Sees a therapist who she says she should "cut off relationship" with her family. Pt. Talked about her self sabotaging behavior of calling her family on New years day before her recent SI plan.    Neila Gear 06/19/2011, 3:41 PM

## 2011-06-19 NOTE — Progress Notes (Signed)
[facility] MD Progress Note  06/19/2011 12:21 PM  Patient reports during rounds, "I am feeling a little better today. I am still having suicidal thoughts and have contacted for safety with the nurse. I am still having some chest congestions with cough. My nasal passages are not as stuffed. I have problems with hemorrhoids, and it hurts to use the bathroom. My anxiety is not controlled at all with Thorazine 25mg , and I have not felt anything from taking Zoloft yet"  Diagnosis:   Axis I: Major Depression, Recurrent severe, with psychotic features, Axis II: Deferred Axis III:  Past Medical History  Diagnosis Date  . CAD (coronary artery disease)     s/p BMS to RCA and LCX 2008. In-stent restenosis of RCA stent rx'd with Xience DES 12/09. Requires extensive sedation for cath. cath 11/10: LAD mild plaque. LCX 10% RCA 50% ISR (FFR 0.91/0.92) Distal RCA 40/50. Cath 2/12: LAD 20-30; CFX 20-30, stent with 20-30; RCA stent with 30-40 and dRCA 30-40; EF 55-60%;  06/04/2011 - Nonobstructive Cath  . Chronic chest pain   . Anxiety and depression   . H/O: suicide attempt     drug overdose 3/08  . Hypertension   . Obesity   . Tobacco abuse   . H/O ETOH abuse   . Acid reflux   . Arthritis   . Hyperlipidemia   . Pneumonia   . UTI (lower urinary tract infection)   . Colitis   . GERD (gastroesophageal reflux disease)   . COPD (chronic obstructive pulmonary disease)    Axis IV: problems with primary support group Axis V: 51-60 moderate symptoms  ADL's:  Intact  Sleep:  Yes,  AEB: 6.75  Appetite:  Yes,  AEB: "I eat well"  Suicidal Ideation:   Plan:  Yes  Intent:  No  Means:  No  Homicidal Ideation:   Plan:  No  Intent:  No  Means:  No  AEB (as evidenced by):  Mental Status: General Appearance Kara Lamb:  Casual Eye Contact:  Good Motor Behavior:  Normal Speech:  Normal Level of Consciousness:  Alert Mood:  "I am feeling a little better today" Affect:  Constricted Anxiety Level:   Moderate Thought Process:  Coherent Thought Content:  Rumination Perception:  Illusions Judgment:  Poor Insight:  Poor Cognition:  Orientation time, place and person Sleep:  Number of Hours: 6.75   Vital Signs:Blood pressure 119/82, pulse 83, temperature 98 F (36.7 C), temperature source Oral, resp. rate 14, height 5' 0.05" (1.525 m), weight 188 lb (85.276 kg), SpO2 99.00%.  Lab Results: No results found for this or any previous visit (from the past 48 hour(s)).    Treatment Plan Summary: Daily contact with patient to assess and evaluate symptoms and progress in treatment Medication management  Plan: Will discontinue Sudafed, initiate Mucinex 600 mg twice daily.           Increase Thorazine to 50 3 times daily,           Preparation-H to rectal areas 3 times daily.  [name] I 06/19/2011, 12:21 PM

## 2011-06-19 NOTE — Progress Notes (Signed)
Pt up and positive for group, interacting appropriately within the milieu.  Pt currently denies SI/HI/hallucinations, appears bright and appropriate.  Pt still having some difficulty with bronchitis, generalized body aches.  Pt reported that she takes Plavix daily due to the fact that she has had a heart attack in the past and the medication had been discontinued here.  Called doctor and got ordered renewed.  Pt pleased, less anxious once this was resolved.  Support and encouragement offered.  Will continue to monitor.

## 2011-06-19 NOTE — Progress Notes (Signed)
Patient ID: Kara Lamb, female   DOB: 10-26-1955, 56 y.o.   MRN: [id number] Nursing: Pt. Did not come for her 20:00 meds until closer to 21:00.  Pt. Did ask for a "cough drop" at 20:30, then came back around 21:00 to receive her other meds, including the inhaler, holding her "lists" of medications and checking them off as she took each one.   When she came to the Plavix tab, she refused it , stating, "i've told them, I only take it in the morning."  Pill was removed and Pt. took the remaining pills and went to bed. 22:00 Pt. Came back to the med window and apologetically said "I messed up, I'm sorry: I should have taken that Plavix.  Can I have it now?"  Pt. was then given the pill and she went back to bed. 23:00  Pt. Asleep. 05:30--Pt. came to med window to ask for cough drop and also for her sore throat. 06:00 Took AM meds without incident.

## 2011-06-19 NOTE — Progress Notes (Signed)
[facility] Group Notes:  (Counselor/Nursing/MHT/Case Management/Adjunct)  06/19/2011 10:43 AM Type of Therapy:  After care Planning group Pt. attended after care planning group and was given [place] Suicide Prevention Information with crisis and hotline numbers to use. The patients agreed to use them if needed. The pt. spoke about he SI plan prior to coming to [facility] and pt. Was contacted for safety during the counseling group. Pt. Agreed to tell staff at [facility] about SI and to remain safe while in the hospital. Pt. Was very depressed and seemed to be struggling with not feeling better since coming to the hospital  Kara Lamb 06/19/2011, 10:43 AM

## 2011-06-19 NOTE — Progress Notes (Signed)
Patient ID: Kara Lamb, female   DOB: Aug 26, 1955, 56 y.o.   MRN: [id number] 06/19/2011  Nursing   [name] cont to feel poor. She states her bronchitis is " about the same", that her " cough is better" and that she is " SIck". She is out in the milieu...interacting appropriately with staff and others .She completed her self inventory sheet and on is she wrote she was still having SI and she rated her feelings of depression and hopelessness " 9 / 8 ". SHe contracted for safety with this Clinical research associate. A MD changed her thro

## 2011-06-19 NOTE — Progress Notes (Addendum)
Patient ID: Kara Lamb, female   DOB: [birth year], 56 y.o.   MRN: [id number] 05/18/2012 Nursing  D Pt is OOB UAL this AM...coming to med window to get  meds. SHe is flat, sad, and depressed. She takes her meds as ordered, then questions the nurse about everything that she  Swallowed. SHe states her bronchitis is " still there" but that she " thinks" its getting better.A  She completed her self inventory sheet this AM..Oncology it she wrote that she is still having SI, that she contracts for safety and she rated her feelings of depression and hopelessness " 9 / 8 " respectively. Today her med changes include DC'ing her pseudofed, adding muccinex bid, hydrocortisone cream topically, increasing prn thorazine from 25 to 50 mg, adding omega 3 bid and increasing prn tylenol to q 4hr from q 6 hr. R Safety is mantained and POC includes fostering therapeutic relationship already established PD RN [facility]

## 2011-06-20 DIAGNOSIS — F411 Generalized anxiety disorder: Secondary | ICD-10-CM

## 2011-06-20 MED ORDER — SERTRALINE HCL 50 MG PO TABS
150.0000 mg | ORAL_TABLET | Freq: Every day | ORAL | Status: DC
  Administered 2011-06-21 – 2011-06-23 (×3): 150 mg via ORAL
  Filled 2011-06-20 (×3): qty 1

## 2011-06-20 MED ORDER — CALCIUM CARBONATE 1250 (500 CA) MG PO TABS
1.0000 | ORAL_TABLET | Freq: Two times a day (BID) | ORAL | Status: DC
  Administered 2011-06-20 – 2011-06-23 (×6): 500 mg via ORAL
  Filled 2011-06-20 (×6): qty 1

## 2011-06-20 NOTE — Progress Notes (Signed)
[facility] MD Progress Note  06/20/2011 3:19 PM  Patient reports, "I am still not sure that the Zoloft 100 mg is working. I think I have given it enough time to show me it will work. I need my Calcium supplement time changed. I don't like the dosing to be at 2:00 pm. I can I either take it at [facility] or much later time in the evening. I am feeling very anxious about discharge. Am I ready to go home soon?, because I am still feeling suicidal, but not consistently. But I am contracting for safety"  Diagnosis:   Axis I: major depressive disorder with psychotic features, Anxiety disorder Axis II: Borderline Personality Dis. Axis III:  Past Medical History  Diagnosis Date  . CAD (coronary artery disease)     s/p BMS to RCA and LCX 2008. In-stent restenosis of RCA stent rx'd with Xience DES 12/09. Requires extensive sedation for cath. cath 11/10: LAD mild plaque. LCX 10% RCA 50% ISR (FFR 0.91/0.92) Distal RCA 40/50. Cath 2/12: LAD 20-30; CFX 20-30, stent with 20-30; RCA stent with 30-40 and dRCA 30-40; EF 55-60%;  06/04/2011 - Nonobstructive Cath  . Chronic chest pain   . Anxiety and depression   . H/O: suicide attempt     drug overdose 3/08  . Hypertension   . Obesity   . Tobacco abuse   . H/O ETOH abuse   . Acid reflux   . Arthritis   . Hyperlipidemia   . Pneumonia   . UTI (lower urinary tract infection)   . Colitis   . GERD (gastroesophageal reflux disease)   . COPD (chronic obstructive pulmonary disease)    Axis IV: No changes Axis V: 51-60 moderate symptoms  ADL's:  Intact  Sleep:  Yes,  AEB: 6.75  Appetite:  Yes,  AEB:  Suicidal Ideation: Yes  Plan:  No  Intent:  No  Means:  No  Homicidal Ideation:   Plan:  No  Intent:  No  Means:  No    Mental Status: General Appearance /Behavior:  Casual Eye Contact:  Good Motor Behavior:  Normal Speech:  Normal Level of Consciousness:  Alert Mood:  Depressed Affect:  Blunt Anxiety Level:  Minimal Thought Process:   Circumstantial and Tangential Thought Content:  WNL Perception:  Delusional Judgment:  Poor Insight:  Absent Cognition:  Orientation time, place and person Sleep:  Number of Hours: 6.75   Vital Signs:Blood pressure 93/65, pulse 75, temperature 97.4 F (36.3 C), temperature source Oral, resp. rate 18, height 5' 0.05" (1.525 m), weight 188 lb (85.276 kg), SpO2 99.00%.  Lab Results: No results found for this or any previous visit (from the past 48 hour(s)).  Physical Findings: AIMS:  , ,  ,  ,    CIWA:    COWS:     Treatment Plan Summary: Daily contact with patient to assess and evaluate symptoms and progress in treatment Medication management  Plan: Will increase Sertraline to 150mg  q daily.           Change [place] afternoon dosing to 2000 per patient's request.           Continue current treatment plan  [name] I 06/20/2011, 3:19 PM

## 2011-06-20 NOTE — Progress Notes (Signed)
[facility] Group Notes:  (Counselor/Nursing/MHT/Case Management/Adjunct)  06/20/2011 4:21 PM  Type of Therapy:  Counseling Group Therapy  Participation Level:  Active  Participation Quality:  Appropriate  Affect:  Appropriate  Cognitive:  Appropriate  Insight:  Good  Engagement in Group:  Good  Engagement in Therapy:  Good  Modes of Intervention:  Clarification and Support  Summary of Progress/Problems: Pt.  participated in group session on supports and how to find supports if they have none. The pt shared who their supports wee in their lives and identified unhealthy and healthy supports. Pt.'s were encouraged to have more than one support if their support persons were not able to be there for them. Pt. Spoke about having church members that are a great support to her.  Kara Lamb 06/20/2011, 4:21 PM

## 2011-06-20 NOTE — Progress Notes (Signed)
Pt admits to thoughts of suicide. States that she thinks about it often but will not act on it in the hospital. Talks about her family and how they ostracize her and have nothing to do with her. Has developed some friendships over the last couple of months which Pt seems to feel are healthy. Has attended the groups, participates and interacts with select peers. Affect is flat and sad, mood depressed. States that she does not feel well. C/o a sore throat and general body aches and pains. Given support and reassurance.

## 2011-06-20 NOTE — Progress Notes (Signed)
Pt has been up and positive for groups today, denies any acute distress.  Wants to discuss medications with doctor and length of time before she sees results.  Pt reports some passive SI off and on, but Pt does contract.  Pt brightens on approach, support and encouragement offered.  Will continue to monitor.

## 2011-06-21 ENCOUNTER — Ambulatory Visit (HOSPITAL_COMMUNITY)
Admit: 2011-06-21 | Discharge: 2011-06-21 | Disposition: A | Discharge status: Home / Self Care | Payer: Medicare Other | Attending: Psychiatry | Admitting: Psychiatry

## 2011-06-21 ENCOUNTER — Telehealth: Payer: Self-pay | Admitting: Critical Care Medicine

## 2011-06-21 MED ORDER — TRAZODONE HCL 100 MG PO TABS
100.0000 mg | ORAL_TABLET | Freq: Once | ORAL | Status: AC
  Administered 2011-06-21: 100 mg via ORAL
  Filled 2011-06-21: qty 1

## 2011-06-21 MED ORDER — ALBUTEROL SULFATE HFA 108 (90 BASE) MCG/ACT IN AERS: 2.0000 | INHALATION_SPRAY | RESPIRATORY_TRACT | Status: DC | PRN

## 2011-06-21 MED ORDER — SERTRALINE HCL 50 MG PO TABS: 150.0000 mg | ORAL_TABLET | Freq: Every day | ORAL | Status: DC

## 2011-06-21 NOTE — Progress Notes (Signed)
[facility] Group Notes:  (Counselor/Nursing/MHT/Case Management/Adjunct)  06/21/2011 3:42 PM  Type of Therapy:  Psychoeducational Skills  Participation Level:  Did Not Attend [name], LPCA    Niomi Valent Garret Reddish 06/21/2011, 3:42 PM

## 2011-06-21 NOTE — Progress Notes (Signed)
Pt is visible on the unit and attending the scheduled groups. Reports SI but contracts for her safety. Pt has expressed interest in IOP and met with staff member from IOP to discuss the program this afternoon. She continues to complain of SOB and cough. Patient had Chest X-ray completed today and it was negative. Rates depression/hopelessness at 6. Energy level is low but ability to pay attention is improving. Remains safe on the unit.

## 2011-06-21 NOTE — Telephone Encounter (Signed)
ATC pt and was hung up when transferred WCB--does not look like CXR was ordered by PW

## 2011-06-21 NOTE — Discharge Summary (Signed)
Discharge Note  Patient:  Kara Lamb is an 56 y.o., female DOB:  1956-04-12  Date of Admission:  06/16/2011  Date of Discharge:  06/23/2011  Level of Care:  OP  Discharge destination:  HOME  Is patient on multiple antipsychotic therapies at discharge:  NO  Patient phone:  [phone] (home) Patient address:   [address] [place] [address]  The patient received suicide prevention pamphlet:  YES Belongings returned:  Valuables  Dan Humphreys, Vicke Plotner 06/21/2011,5:27 PM

## 2011-06-21 NOTE — Progress Notes (Signed)
Pt. Requesting that she been given Trazodone for sleep.  Pt was. Told that her Trazodone has been discontinued due to statements that she had made to the Nurse Practioner  That it made her have leg cramps and facial twitches.  Pt.  Stated that  The  NP must have misunderstood what she was saying and that it was Thorazine that made her have the leg cramps and facial twitches. Pt. Denies leg cramps and facial twitching from Trazodone.  Dr. Ferol Luz  Notified of pt's Request and denial.  Order given for Trazodone 100mg  po a one time dose.

## 2011-06-21 NOTE — Progress Notes (Signed)
[facility] Group Notes:  (Counselor/Nursing/MHT/Case Management/Adjunct)  06/21/2011 2:45 PM  Type of Therapy:  Group Therapy  Participation Level:  Active  Participation Quality:  Monopolizing, Sharing and Supportive  Affect:  Appropriate  Cognitive:  Appropriate  Insight:  Good  Engagement in Group:  Good  Engagement in Therapy:  Good  Modes of Intervention:  Problem-solving, Support and exploration  Summary of Progress/Problems: Kara Lamb active in group therapy session on overcoming obstacles. Pt was able to share that each day she feels like she is getting better and that going to IOP will help remove her obstacles. Pt shared that she was doing well but stopped her previous plan to wellness because the other people in the mental health community were not as high functioning as her and she had no friends. Pt was open to feedback from group on healthy places to meet friends and support. Pt able to give feedback to group however did so in away that she attempted to put self on higher plane. [name], LPCA    Kara Lamb 06/21/2011, 2:45 PM

## 2011-06-21 NOTE — Discharge Summary (Addendum)
Patient ID: Kara Lamb MRN: [id number] DOB/AGE: May 23, 1956 56 y.o.  Admit date: 06/16/2011 Discharge date: 06/22/2011  Reason for Admission: Pt got mad at her family for excluding her from their family gathering on New Years day.  Her out patient therapist had forbad her from contacting them, but she call anyway at the time the family was getting together and got depressed that they were still having their family traditional New Years gathering with out her.  She made detailed plans for her funeral and than came to the hospital describing her funeral plans and plans for her own cremation.  Hospital Course:  Patient was admitted for her plans for her funeral.  She reported that she had weekly sessions with her out patient therapist.  She was informed that she needed to keep those going as out patient therapy is where her cluster B traits can be managed most optimally.  She began revealing her obsessive thinking.  She also displayed considerable borderline behaviors on the unit by attempting to split staff and she kept details of how different staff were doing things wrong.  She also staged a protest in morning group about talking in group was a breech of privacy.  She tended to monopolize the group therapies by focusing on how all the other patients were victims and everyone else had harmed them and that they were going to always be a victims.  She tended to promote a lot of drama about reporting what all her children had done to her and then close her eyes and when all the other patients came to her aid, she would whimper and say it was just too hard to talk about.  She tended to be very obsessive in how she had to be precisely accurate about how she said things.  This was pointed out to her and she was able to see that that was OCD.  She was increased on her Zoloft from 100 mg that she had been on for some time to 150 mg.  She was hopeful that that would help her in 90 days as that would be when such an  improvement might be expected with that change.  Attempt was made to adjust her off Klonopin on to Thorazine, but she did not feel that 25 mg TID and 50 MG TID did not help enough and so she was restarted on her Klonopin.  She denied any lost of time or memory loss on the Klonopin.  It would seem that memory loss and loss of control of her actions would be important for someone with as much OCD as she seems to be operating under.  Her sleep medication Trazodone was lowered from 200 mg to 150 mg with benefit noted.  She was discharged not on the Tuesday before her appointment with her therapist, but rather on Wed AM before her appointment so she could keep her standing outpatient therapy appointment.    Discharge Diagnoses:  AXIS I Bipolar, Type 1, Obsessive Compulsive Disorder and Post Traumatic Stress Disorder  AXIS II Cluster B Traits  AXIS III Past Medical History  Diagnosis Date  . CAD (coronary artery disease)     s/p BMS to RCA and LCX 2008. In-stent restenosis of RCA stent rx'd with Xience DES 12/09. Requires extensive sedation for cath. cath 11/10: LAD mild plaque. LCX 10% RCA 50% ISR (FFR 0.91/0.92) Distal RCA 40/50. Cath 2/12: LAD 20-30; CFX 20-30, stent with 20-30; RCA stent with 30-40 and dRCA 30-40; EF 55-60%;  06/04/2011 - Nonobstructive Cath  . Chronic chest pain   . Anxiety and depression   . H/O: suicide attempt     drug overdose 3/08  . Hypertension   . Obesity   . Tobacco abuse   . H/O ETOH abuse   . Acid reflux   . Arthritis   . Hyperlipidemia   . Pneumonia   . UTI (lower urinary tract infection)   . Colitis   . GERD (gastroesophageal reflux disease)   . COPD (chronic obstructive pulmonary disease)     AXIS IV other psychosocial or environmental problems and problems with primary support group  AXIS V 51-60 moderate symptoms   Condition on  Discharge: Suicidal Ideation:   Plan:  No  Intent:  No  Means:  No  Homicidal Ideation:   Plan:  No  Intent:   No  Means:  No  Mental Status: General Appearance /Behavior:  Disheveled Eye Contact:  Good Motor Behavior:  Normal Speech:  Normal Level of Consciousness:  Alert Mood:  1 on a scale of 1 is the least and 10 is the most Affect:  Appropriate Anxiety Level:  3 on a scale of 1 is the least and 10 is the most Thought Process:  Coherent Thought Content:  Obsessions Perception:  Normal Judgment:  Fair Insight:  Limited by her borderline traits Cognition:  Orientation time, place and person Concentration Yes Sleep:  Number of Hours: 3.75   Vital Signs:Blood pressure 114/76, pulse 85, temperature 96.9 F (36.1 C), temperature source Oral, resp. rate 18, height 5' 0.05" (1.525 m), weight 85.276 kg (188 lb), SpO2 99.00%. Lab Results:No results found for this or any previous visit (from the past 48 hour(s)).   Risk of self harm is elevated due to her borderline traits, but she has pledged a more positive situation for herself by her deciding to be in charge of herself.  Risk of harm to others is minimal in that she does not have a record of any aggression.  Plan: Discharge Orders    Future Appointments: Provider: Department: Dept Phone: Center:   07/13/2011 10:15 AM Verne Carrow, MD [facility] [phone] [facility]     Current Discharge Medication List    START taking these medications   Details  azithromycin (ZITHROMAX) 250 MG tablet Take by mouth one each morning until gone. Qty: 3 each, Refills: 0    sertraline (ZOLOFT) 50 MG tablet Take 3 tablets (150 mg total) by mouth daily. Qty: 90 tablet, Refills: 0    !! traZODone (DESYREL) 150 MG tablet Take 1 tablet (150 mg total) by mouth at bedtime. Qty: 30 tablet, Refills: 0     !! - Potential duplicate medications found. Please discuss with provider.    CONTINUE these medications which have CHANGED   Details  !! albuterol (PROAIR HFA) 108 (90 BASE) MCG/ACT inhaler Inhale 2 puffs into the lungs every 4 (four)  hours as needed for wheezing or shortness of breath. Qty: 1 Inhaler, Refills: 0     !! - Potential duplicate medications found. Please discuss with provider.    CONTINUE these medications which have NOT CHANGED   Details  !! albuterol (PROAIR HFA) 108 (90 BASE) MCG/ACT inhaler Inhale 2 puffs into the lungs every 4 (four) hours as needed. For shortness of breath.     !! albuterol (PROVENTIL HFA;VENTOLIN HFA) 108 (90 BASE) MCG/ACT inhaler Inhale 2 puffs into the lungs every 4 (four) hours as needed. For shortness of breath  Ascorbic Acid (VITAMIN C) 1000 MG tablet Take 1,000 mg by mouth every morning.     aspirin EC 81 MG tablet Take 81 mg by mouth every morning.      atorvastatin (LIPITOR) 80 MG tablet Take 80 mg by mouth at bedtime. She takes every night at 9pm.    Cholecalciferol (VITAMIN D3) 5000 UNITS TABS Take 1 tablet by mouth every morning.      clonazePAM (KLONOPIN) 1 MG tablet Take 1 mg by mouth 3 (three) times daily as needed. For anxiety    clopidogrel (PLAVIX) 75 MG tablet Take 75 mg by mouth every morning.      esomeprazole (NEXIUM) 40 MG capsule Take 40 mg by mouth daily before breakfast. She takes daily at 6am.     fish oil-omega-3 fatty acids 1000 MG capsule Take 2 g by mouth 2 (two) times daily with a meal.     gabapentin (NEURONTIN) 100 MG capsule Take 200 mg by mouth at bedtime.      Glucos-MSM-C-Mn-Ginger-Willow (GLUCOSAMINE MSM COMPLEX) TABS Take 1 tablet by mouth 3 (three) times daily with meals.     isosorbide mononitrate (IMDUR) 30 MG 24 hr tablet Take 15 mg by mouth every morning.      loratadine (CLARITIN) 10 MG tablet Take 10 mg by mouth every morning.     Multiple Vitamins-Minerals (MULTIVITAMIN WITH MINERALS) tablet Take 1 tablet by mouth every morning.     nitroGLYCERIN (NITROSTAT) 0.4 MG SL tablet Place 0.4 mg under the tongue every 5 (five) minutes as needed. For chest pain    !! OVER THE COUNTER MEDICATION Take 1 tablet by mouth 2 (two) times  daily. She takes at 8am and dinnertime.    !! OVER THE COUNTER MEDICATION Place 1 drop under the tongue every morning. Vitamin B complex/B12 Liquid.    !! OVER THE COUNTER MEDICATION Take 1 tablet by mouth every morning. Calcium 1200mg .    !! OVER THE COUNTER MEDICATION Take 1 tablet by mouth every morning. Vitamin D3  5000 iu.    tiotropium (SPIRIVA) 18 MCG inhalation capsule Place 18 mcg into inhaler and inhale every morning.     topiramate (TOPAMAX) 100 MG tablet Take by mouth 2 (two) times daily. She takes one tablet every morning and one and a half tablets at bedtime.    !! traZODone (DESYREL) 100 MG tablet Take 100 mg by mouth at bedtime. She takes every night at 9pm.     !! - Potential duplicate medications found. Please discuss with provider.    STOP taking these medications     aspirin 81 MG tablet      DULoxetine (CYMBALTA) 60 MG capsule        Follow-up Information    Follow up with Horald Pollen - [facility] on 06/30/2011. (Your appointment with Nile Riggs is scheudled for 9:15 on Wednesday, June 30, 2011)    Contact information:   [address] [place], [place]  [address]  [phone]      Follow up with Lauree [facility] - [facility] on 06/23/2011. (Your appointment will [facility] is scheduled for Wednesday, June 23, 2011 at 11:00 a.m.)         Signed: Orson Aloe 06/22/2011, 4:35 PM

## 2011-06-21 NOTE — Tx Team (Signed)
Interdisciplinary Treatment Plan Update (Adult)  Date:  06/21/2011  Time Reviewed:  10:18 AM   Progress in Treatment: Attending groups:   Yes   Participating in groups:  Yes Taking medication as prescribed:  Yes Tolerating medication:  Yes Family/Significant othe contact made: No family involvement Patient understands diagnosis:  Yes Discussing patient identified problems/goals with staff: Yes Medical problems stabilized or resolved: Yes Denies suicidal/homicidal ideation:Yes - Currently denies SI Issues/concerns per patient self-inventory:  Other:  New problem(s) identified:  Reason for Continuation of Hospitalization: Anxiety Depression Medication stabilization Depression Medication Management  Interventions implemented related to continuation of hospitalization:  Medication Management; safety checks q 15 mins  Additional comments:  Reviewed suicide prevention education  Estimated length of stay:  2-4 days  Discharge Plan:  Home with out  New goal(s):  Review of initial/current patient goals per problem list:   1.  Goal(s):  Eliminate SI  Met:  No  Target date:  Denies SI today  As evidenced by:  2.  Goal (s):  .Reduce depression   Met:  No  Target date:  D/c As evidenced by:  Rating depression at six; rated at ten on admission  3.  Goal(s): .stabilize on meds   Met:  No  Target date: d/c  As evidenced by: Will report decreased symptoms; medication working  4.  Goal(s):  Schedule outpatient f/u  Met:  Yes  Target date: d/c  .      As evidenced by: Follow up schedule  Attendees: Patient:     Family:     Physician:  Orson Aloe, MD 06/21/2011 10:18 AM   Nursing:   Neill Loft, RN 06/21/2011 10:18 AM   CaseManager:  Juline Patch, LCSW 06/21/2011 10:18 AM   Counselor:  [name], LPCA 06/21/2011 10:18 AM   Other:  Consuello Bossier, NP 06/21/2011 10:18 AM   Other:  Reyes Ivan, LCSWA 06/21/2011 10:28 AM   Other:     Other:      Scribe for  Treatment Team:   Wynn Banker, LCSW,  06/21/2011 10:18 AM

## 2011-06-21 NOTE — Progress Notes (Signed)
[facility] MD Progress Note  06/21/2011 1:38 PM  "I am having cramps on my legs. It started after I was given Trazodone. Trazodone is not working for me. It makes me feel groggy in the morning. Trazodone does not help my anxiety. I have facial twitches, I think it is coming from the Trazodone. I am not breathing well. I am coughing quite much. Still, I am not coughing up anything. I am having shortness of breath. I think I might have pneumonia, even though I don't have a fever. 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 Axis II: Borderline Personality Dis. Axis III:  Past Medical History  Diagnosis Date  . CAD (coronary artery disease)     s/p BMS to RCA and LCX 2008. In-stent restenosis of RCA stent rx'd with Xience DES 12/09. Requires extensive sedation for cath. cath 11/10: LAD mild plaque. LCX 10% RCA 50% ISR (FFR 0.91/0.92) Distal RCA 40/50. Cath 2/12: LAD 20-30; CFX 20-30, stent with 20-30; RCA stent with 30-40 and dRCA 30-40; EF 55-60%;  06/04/2011 - Nonobstructive Cath  . Chronic chest pain   . Anxiety and depression   . H/O: suicide attempt     drug overdose 3/08  . Hypertension   . Obesity   . Tobacco abuse   . H/O ETOH abuse   . Acid reflux   . Arthritis   . Hyperlipidemia   . Pneumonia   . UTI (lower urinary tract infection)   . Colitis   . GERD (gastroesophageal reflux disease)   . COPD (chronic obstructive pulmonary disease)    Axis IV: No changes Axis V: 51-60 moderate symptoms  ADL's:  Intact  Sleep:  Yes,  AEB: 6.75  Appetite:  Yes,  AEB: "I'm eating [employer]  Suicidal Ideation:   Plan:  No  Intent:  No  Means:  No  Homicidal Ideation:   Plan:  No  Intent:  No  Means:  No    Mental Status: General Appearance /Behavior:  Casual Eye Contact:  Fair Motor Behavior:  Normal Speech:  Normal Level of Consciousness:  Alert Mood:  Depressed Affect:  Blunt Anxiety Level:  Minimal Thought  Process:  Coherent Thought Content:  Obsessions Perception:  Normal Judgment:  Fair Insight:  Absent Cognition:  Orientation time, place and person Sleep:  Number of Hours: 6.75   Vital Signs:Blood pressure 115/81, pulse 89, temperature 98.1 F (36.7 C), temperature source Oral, resp. rate 18, height 5' 0.05" (1.525 m), weight 85.276 kg (188 lb), SpO2 99.00%.  Lab Results: No results found for this or any previous visit (from the past 48 hour(s)).  Physical Findings: AIMS:  , ,  ,  ,    CIWA:    COWS:     Treatment Plan Summary: Daily contact with patient to assess and evaluate symptoms and progress in treatment Medication management  Plan: Chest -xray today,           Discontinue Trazodone.           Continue current treatment plan.  [name] I 06/21/2011, 1:38 PM

## 2011-06-21 NOTE — Progress Notes (Signed)
Patient seen during d/c planning group. Patient reports being a little better.  She denies SI today but rates depression at six, anxiety at eight, helplessness at three and hopeless at four.  Patient reported she is requesting a chest x-ray due to having a could.  She also expresses and interest in MH-IOP.  Outpatient notified and advised they will meet with her later today.

## 2011-06-22 DIAGNOSIS — F329 Major depressive disorder, single episode, unspecified: Secondary | ICD-10-CM

## 2011-06-22 MED ORDER — AZITHROMYCIN 500 MG PO TABS
500.0000 mg | ORAL_TABLET | Freq: Every day | ORAL | Status: AC
  Administered 2011-06-23: 500 mg via ORAL
  Filled 2011-06-22: qty 1

## 2011-06-22 MED ORDER — TRAZODONE HCL 150 MG PO TABS: 150.0000 mg | ORAL_TABLET | Freq: Every day | ORAL | Status: DC

## 2011-06-22 MED ORDER — AZITHROMYCIN 250 MG PO TABS: ORAL_TABLET | ORAL | Status: DC

## 2011-06-22 MED ORDER — AZITHROMYCIN 250 MG PO TABS
250.0000 mg | ORAL_TABLET | Freq: Every day | ORAL | Status: DC
  Filled 2011-06-22 (×2): qty 1

## 2011-06-22 MED ORDER — TRAZODONE HCL 150 MG PO TABS
150.0000 mg | ORAL_TABLET | Freq: Every day | ORAL | Status: DC
  Administered 2011-06-22: 150 mg via ORAL
  Filled 2011-06-22 (×2): qty 1
  Filled 2011-06-22: qty 3

## 2011-06-22 MED ORDER — AZITHROMYCIN 250 MG PO TABS: ORAL_TABLET | ORAL | Status: AC

## 2011-06-22 MED ORDER — AZITHROMYCIN 250 MG PO TABS
250.0000 mg | ORAL_TABLET | Freq: Every day | ORAL | Status: DC
  Administered 2011-06-23: 250 mg via ORAL
  Filled 2011-06-22: qty 1

## 2011-06-22 MED ORDER — AZITHROMYCIN 500 MG PO TABS
500.0000 mg | ORAL_TABLET | ORAL | Status: AC
  Administered 2011-06-22: 500 mg via ORAL
  Filled 2011-06-22: qty 2
  Filled 2011-06-22: qty 1

## 2011-06-22 NOTE — Telephone Encounter (Signed)
She should start azithromycin 250mg  Take two once then one daily until gone  #6  Call in to her phamacy

## 2011-06-22 NOTE — Telephone Encounter (Signed)
I spoke with pt and made her aware of PW recs. She voiced her understanding of the directions. I then spoke with Dr. Dan Humphreys over at behavorial health and advised of PW recs. He voiced his understanding and states he will go ahead an prescribe this for pt. Nothing further was needed

## 2011-06-22 NOTE — Progress Notes (Signed)
Patient ID: Kara Lamb, female   DOB: [birth year], 56 y.o.   MRN: [id number]

## 2011-06-22 NOTE — Progress Notes (Signed)
Patient asleep in bed, resting with eyes closed. Appears in no distress. Respirations even, normal and unlabored. Continue monitoring patient q15 minutes to ensure safety while on the unit. Patient has HOB elevated and laying on back in seated position.

## 2011-06-22 NOTE — Progress Notes (Signed)
Patient ID: Kara Lamb, female   DOB: 1956/03/17, 56 y.o.   MRN: [id number] BP: 114/76 Pulse: 85 Temp: 96.9 F (36.1 C)  Subjective:  Pt. Resting in bed today with towel over her eyes, emesis basin with tissues, water pitcher in reach filled with ice chips.  She is propped up in bed.  She states she is currently waiting a follow up call from her Pulmonologist, Dr. Delford Field, regarding the CXR she had yesterday.  She tells me she is concerned she needs antibiotics, and no one listened to her chest yesterday.  She denies fevers, but notes she has had chills, through out the night, but has eaten ice chips all night, and she has body aches.  Kara Lamb tells me she was recently diagnosed with COPD,  And Chronic Obstructive Bronchitis.  She also has multiple other medical problems.  She also states that she has had nausea, no vomiting, but some diarrhea.  Psych: Pt. Notes that her mood is better, denies HI, denies SI, or AH/VH. Mood : euthymic  Affect: positive, dramatic Speech: clear goal directed. Chest: Minimal inspiratory effort, with faint bronchial wheezes, no rhonchi, no rales.  Heart RRR, no rubs murmurs bruits or gallops. A) MDD recurrent severe no psychotic features noted today     BPD with multiple medical problems     COPD with cough, likely viral source given no fever P.) Continue current medications, will cover with Zithromycin per Dr.Wright     Pt. Requesting her sleep medications will consult with MD re restarting trazodone. Kara Lamb PAC

## 2011-06-22 NOTE — Telephone Encounter (Signed)
Returning call can be reached at [phone].Kara Lamb

## 2011-06-22 NOTE — Progress Notes (Signed)
Patient ID: Kara Lamb, female   DOB: 10-Sep-1955, 56 y.o.   MRN: [id number] Patient came up to window tonight with complaints of cough and congestion. Patient has nonproductive cough. No acute signs of distress noted. Patient given scheduled 0000 dose of albuterol per order. Respirations even, normal and unlabored. Respirations symmetrical. No other complaints this evening other than of cough/congestion. After 0000 dose of albuterol, patient has been resting in room.

## 2011-06-22 NOTE — Telephone Encounter (Signed)
I spoke with pt and she states she had a CXR done on 06/21/11 over at [facility]. Pt states she was not giving these results and would like Dr. Delford Field to look at. Pt c/o cough w/ dark brown to yellow phlem, severe chest tightness, body aches, nausea, dizziness x Thursday morning. Pt is currently admitted at behavioral health and will be d/c'd tomorrow at 10 am. Pt is requesting recs from Dr. Delford Field. Please advise thanks  No Known Allergies

## 2011-06-22 NOTE — Progress Notes (Signed)
[facility] Group Notes:  (Counselor/Nursing/MHT/Case Management/Adjunct)    Type of Therapy:  Group Therapy  Participation Level:  Did Not Attend       Billie Lade 06/22/2011  11:44 AM

## 2011-06-22 NOTE — Progress Notes (Signed)
Slept poorly last nite, did not get sleep med that helps her sleep, appetite is good, energy level is low and ability to pay attention is improving, depressed 4/10 and hopeless 2/10, denies Si or HI, complaining of cough for which she is seeing the MD today, taking all her meds as ordered, asked for Klonopin at morning med pass, attending group and is hoping to be discharged today or tomorrow q48min safety checks continue and support offeered Safety maintained

## 2011-06-22 NOTE — Progress Notes (Signed)
Patient reported that her Trazodone was discontinued yesterday and she wasn't able to sleep last night. She said that coupled with the fact that she came down with some cold and cough. She reported that Z-Pak helped her with the problem. Patient had consult with MD. Writer encouraged patient to discuss the insomnia problem with the physician. MD placed patient back on Trazodone. Pt denied SI/HI and denied hallucinations. Although her mood and affect seemed irritable and depressed. Q 15 minute checks continues to maintain safety.

## 2011-06-22 NOTE — Progress Notes (Signed)
[facility] Group Notes: (Counselor/Nursing/MHT/Case Management/Adjunct) 06/22/2011   @1 :15pm  Type of Therapy:  Group Therapy  Participation Level:  Active  Participation Quality:  Appropriate, Attentive, Sharing  Affect:  Appropriate  Cognitive:  Appropriate  Insight:  Good  Engagement in Group: Good  Engagement in Therapy:  Good  Modes of Intervention:  Support and Exploration  Summary of Progress/Problems: Kara Lamb was encouraging and supportive to others. She used her experience with unfair loss to attempt to give another group member new perspective. Kara Lamb discussed how therapy has been helpful to her and how she has recently uncovered the root of some distressing behavior because she kept working at it and did not give up.   Billie Lade 06/17/2011  3:56 PM\

## 2011-06-22 NOTE — Progress Notes (Signed)
Suicide Risk Assessment  Discharge Assessment     Demographic factors:  Assessment Details Time of Assessment: Admission Information Obtained From: Patient Current Mental Status:  Current Mental Status: Suicidal ideation indicated by patient;Plan includes specific time, place, or method;Suicide plan;Self-harm thoughts;Belief that plan would result in death Risk Reduction Factors:  Risk Reduction Factors: Positive therapeutic relationship  CLINICAL FACTORS:   Severe Anxiety and/or Agitation Alcohol/Substance Abuse/Dependencies Obsessive-Compulsive Disorder Personality Disorders:   Cluster B Chronic Pain Previous Psychiatric Diagnoses and Treatments Medical Diagnoses and Treatments/Surgeries  COGNITIVE FEATURES THAT CONTRIBUTE TO RISK:  Thought constriction (tunnel vision)    SUICIDE RISK:   Minimal: No identifiable suicidal ideation.  Patients presenting with no risk factors but with morbid ruminations; may be classified as minimal risk based on the severity of the depressive symptoms  Suicidal Ideation:   Plan:  No  Intent:  No  Means:  No  Homicidal Ideation:   Plan:  No  Intent:  No  Means:  No  Mental Status: General Appearance /Behavior:  Disheveled Eye Contact:  Good Motor Behavior:  Normal Speech:  Normal Level of Consciousness:  Alert Mood:  1 on a scale of 1 is the least and 10 is the most Affect:  Appropriate Anxiety Level:  3 on a scale of 1 is the least and 10 is the most Thought Process:  Coherent Thought Content:  Obsessions Perception:  Normal Judgment:  Fair Insight:  Limited by her borderline traits Cognition:  Orientation time, place and person Concentration Yes Sleep:  Number of Hours: 3.75   Vital Signs:Blood pressure 114/76, pulse 85, temperature 96.9 F (36.1 C), temperature source Oral, resp. rate 18, height 5' 0.05" (1.525 m), weight 85.276 kg (188 lb), SpO2 99.00%. Lab Results:No results found for this or any previous visit (from the  past 48 hour(s)).   Risk of self harm is elevated due to her borderline traits, but she has pledged a more positive situation for herself by her deciding to be in charge of herself.  Risk of harm to others is minimal in that she does not have a record of any aggression.  Kara Lamb 06/22/2011, 4:28 PM

## 2011-06-22 NOTE — Progress Notes (Signed)
Grief and Loss Group  Group addressed losses related to relationships and self. Discussion focused on nature of losses, feelings experienced and how they have reacted to their grief. In the end of the group we processed how it is to come to terms with what cannot be changed and what it means to be open to the options and possibilities which exist now. Pt talked about multiple health issues which have impacted her life. When asked about grief she was able to identify feeling a longing for her family to be a true family to her, but she has worked in therapy to accept how they are and to let go of expecting them to be different. She acknowledge that she has friends to actually do care about her and the need to focus on those relationships.    Carlyle Basques.Div, BCC Director, [employer]

## 2011-06-23 MED ORDER — AZITHROMYCIN 250 MG PO TABS: ORAL_TABLET | ORAL | Status: AC

## 2011-06-23 NOTE — Progress Notes (Signed)
Pt D/C home. Pt denies SI/HI. Pt rx and follow-up visits were reviewed and pt verbalized understanding. Pt belongings were returned.  

## 2011-06-23 NOTE — Progress Notes (Signed)
[facility] Case Management Discharge Plan:  Will you be returning to the same living situation after discharge: Yes,  Kara Lamb is returning to her home Would you like a referral for services when you are discharged:No. Do you have access to transportation at discharge:Yes,  Patient has transportation home Do you have the ability to pay for your medications:Yes,  Patient has Medicare and Medicaid  Interagency Information:     Patient to Follow up at:  Follow-up Information    Follow up with Horald Pollen [facility] on 06/30/2011. (Your appointment with Nile Riggs is scheudled for 9:15 on Wednesday, June 30, 2011)    Contact information:   [address] [place], [place]  [address]  [phone]      Follow up with Lauree Chandler - [facility] on 06/23/2011. (Your appointment will Fayette Pho is scheduled for Wednesday, June 23, 2011 at 11:00 a.m.)          Patient denies SI/HI:   Yes - Denied SI today    Safety Planning and Suicide Prevention discussed:  Yes,  Reviewed during discharge planning group  Barrier to discharge identified:No.  Summary and Recommendations:  No recommendation at time of discharge.   Wynn Banker 06/23/2011, 9:41 AM

## 2011-06-23 NOTE — Tx Team (Signed)
Interdisciplinary Treatment Plan Update (Adult)  Date:  06/23/2011  Time Reviewed:  10:39 AM   Progress in Treatment: Attending groups: Yes Participating in groups:  Yes, insightful and processing in groups Taking medication as prescribed:  Yes Tolerating medication: Yes Family/Significant othe contact made:  Yes, contact made with: friend, Bonita Quin Patient understands diagnosis: Yes, can demonstrate understanding Discussing patient identified problems/goals with staff:  Yes Medical problems stabilized or resolved: Yes Denies suicidal/homicidal ideation: Yes Issues/concerns per patient self-inventory:  No  Other:  New problem(s) identified: None  Reason for Continuation of Hospitalization: Appropriate for discharge today   Interventions implemented related to continuation of hospitalization:  Medication stabilization, safety checks q 15 mins, group attendance  Additional comments:  Estimated length of stay:  D/C today  Discharge Plan: Discharge home and follow up with Emmaline Life at [facility]):  Review of initial/current patient goals per problem list:   1. Goal(s): Eliminate SI  Met: Yes Target date: Denies SI today  As evidenced by: No suicidal ideation reported today  2. Goal (s): .Reduce depression  Met: Yes  Target date: D/c As evidenced by: Rating depression at 2;  rated at ten on admission  3. Goal(s): .stabilize on meds  Met: No  Target date: d/c  As evidenced by: Reports decreased symptoms, no intolerable side effects 4. Goal(s): Schedule outpatient f/u  Met: Yes  Target date: d/c . As evidenced by: Follow up scheduled at [facility]  Attendees: Patient:   06/23/2011 10:39 AM  Family:   06/23/2011 10:39 AM  Physician:  Dr Orson Aloe, MD 06/23/2011 10:39 AM  Nursing:   Omelia Blackwater, RN 06/23/2011 10:39 AM  Case Manager:  Juline Patch, LCSW 06/23/2011 10:39 AM  Counselor:  Angus Palms, LCSW 06/23/2011 10:39 AM  Other:  Reyes Ivan, LCSWA 06/23/2011  10:39 AM  Other:  Charlyne Mom, doctoral psych intern 06/23/2011 10:39 AM  Other:   06/23/2011 10:39 AM  Other:   06/23/2011 10:39 AM   Scribe for Treatment Team:   Billie Lade, 06/23/2011 10:39 AM

## 2011-06-25 ENCOUNTER — Ambulatory Visit: Payer: Medicare Other | Admitting: Cardiovascular Disease

## 2011-06-25 NOTE — Progress Notes (Signed)
Patient Discharge Instructions:  Admission Note Faxed,  06/24/2011 Discharge Note Faxed,   06/24/2011 After Visit Summary Faxed,  06/24/2011 Faxed to the Next Level Care provider:  06/24/2011 D/C Summary faxed 06/24/2011 Facesheet faxed 06/24/2011   Faxed to [facility] - Nile Riggs, Annette Stable [place]rent @ [phone]  Wandra Scot, 06/25/2011, 11:21 AM

## 2011-07-01 ENCOUNTER — Ambulatory Visit: Payer: Medicare Other | Admitting: Cardiovascular Disease

## 2011-07-06 ENCOUNTER — Telehealth: Payer: Self-pay | Admitting: Critical Care Medicine

## 2011-07-06 NOTE — Telephone Encounter (Signed)
Pt called back. She asks that this msg. Be disregarded as pt is seeing dr. Yetta Barre tomorrow and will get a referral at that time. "nothing further needed at this time". Thanks. [name]

## 2011-07-07 ENCOUNTER — Ambulatory Visit (INDEPENDENT_AMBULATORY_CARE_PROVIDER_SITE_OTHER): Payer: Medicare Other | Admitting: Internal Medicine

## 2011-07-07 ENCOUNTER — Other Ambulatory Visit (INDEPENDENT_AMBULATORY_CARE_PROVIDER_SITE_OTHER): Payer: Medicare Other

## 2011-07-07 ENCOUNTER — Encounter: Payer: Self-pay | Admitting: Internal Medicine

## 2011-07-07 DIAGNOSIS — Z86010 Personal history of colonic polyps: Secondary | ICD-10-CM

## 2011-07-07 DIAGNOSIS — E785 Hyperlipidemia, unspecified: Secondary | ICD-10-CM

## 2011-07-07 DIAGNOSIS — R197 Diarrhea, unspecified: Secondary | ICD-10-CM

## 2011-07-07 DIAGNOSIS — I10 Essential (primary) hypertension: Secondary | ICD-10-CM

## 2011-07-07 LAB — COMPREHENSIVE METABOLIC PANEL
ALT: 26 U/L (ref 0–35)
AST: 33 U/L (ref 0–37)
BUN: 12 mg/dL (ref 6–23)
Calcium: 9.5 mg/dL (ref 8.4–10.5)
Chloride: 105 mEq/L (ref 96–112)
Creatinine, Ser: 0.8 mg/dL (ref 0.4–1.2)
GFR: 82.64 mL/min (ref >60.00)
Total Bilirubin: 0.7 mg/dL (ref 0.3–1.2)

## 2011-07-07 LAB — CBC WITH DIFFERENTIAL/PLATELET
Basophils Absolute: 0 10*3/uL (ref 0.0–0.1)
Basophils Relative: 0.6 % (ref 0.0–3.0)
Eosinophils Absolute: 0.2 10*3/uL (ref 0.0–0.7)
HCT: 37.4 % (ref 36.0–46.0)
Hemoglobin: 13 g/dL (ref 12.0–15.0)
Lymphocytes Relative: 31.6 % (ref 12.0–46.0)
Lymphs Abs: 2.5 10*3/uL (ref 0.7–4.0)
MCHC: 34.7 g/dL (ref 30.0–36.0)
MCV: 99 fl (ref 78.0–100.0)
Monocytes Absolute: 0.4 10*3/uL (ref 0.1–1.0)
Neutro Abs: 4.7 10*3/uL (ref 1.4–7.7)
RBC: 3.78 Mil/uL — ABNORMAL LOW (ref 3.87–5.11)
RDW: 12.7 % (ref 11.5–14.6)

## 2011-07-07 LAB — LIPID PANEL
Cholesterol: 128 mg/dL (ref 0–200)
LDL Cholesterol: 70 mg/dL (ref 0–99)
Triglycerides: 88 mg/dL (ref 0.0–149.0)

## 2011-07-07 MED ORDER — METRONIDAZOLE 250 MG PO TABS: 250.0000 mg | ORAL_TABLET | Freq: Three times a day (TID) | ORAL | Status: AC

## 2011-07-07 NOTE — Assessment & Plan Note (Signed)
I am concerned that she has developed C diff colitis so I have asked her to start flagyl and I will check her stool today, I will also screen her for IBD with a CRP and will look at her labs for dehydration and abnormal lytes

## 2011-07-07 NOTE — Progress Notes (Signed)
Subjective:    Patient ID: Kara Lamb, female    DOB: 19-Sep-1955, 56 y.o.   MRN: [id number]  Diarrhea  This is a new problem. The current episode started 1 to 4 weeks ago. The problem occurs 2 to 4 times per day. The problem has been unchanged. The stool consistency is described as watery. Associated symptoms include increased flatus. Pertinent negatives include no abdominal pain, arthralgias, bloating, chills, coughing, fever, headaches, myalgias, sweats, URI, vomiting or weight loss. The symptoms are aggravated by nothing. Risk factors include recent antibiotic use. She has tried anti-motility drug for the symptoms. The treatment provided mild relief. microscopic colitis      Review of Systems  Constitutional: Positive for fatigue. Negative for fever, chills, weight loss, diaphoresis, activity change, appetite change and unexpected weight change.  HENT: Negative.   Eyes: Negative.   Respiratory: Negative for cough, chest tightness, shortness of breath, wheezing and stridor.   Cardiovascular: Negative for chest pain, palpitations and leg swelling.  Gastrointestinal: Positive for diarrhea and flatus. Negative for nausea, vomiting, abdominal pain, constipation, blood in stool, abdominal distention, anal bleeding, rectal pain and bloating.  Genitourinary: Negative.   Musculoskeletal: Negative for myalgias, back pain, joint swelling, arthralgias and gait problem.  Skin: Negative for color change, pallor, rash and wound.  Neurological: Negative for dizziness, tremors, seizures, syncope, facial asymmetry, speech difficulty, weakness, light-headedness, numbness and headaches.  Hematological: Does not bruise/bleed easily.  Psychiatric/Behavioral: Negative.        Objective:   Physical Exam  Vitals reviewed. Constitutional: She is oriented to person, place, and time. She appears well-developed and well-nourished. No distress.  HENT:  Head: Normocephalic and atraumatic.  Mouth/Throat:  Oropharynx is clear and moist. No oropharyngeal exudate.  Eyes: Conjunctivae are normal. Right eye exhibits no discharge. Left eye exhibits no discharge. No scleral icterus.  Neck: Normal range of motion. Neck supple. No JVD present. No tracheal deviation present. No thyromegaly present.  Cardiovascular: Normal rate, regular rhythm, normal heart sounds and intact distal pulses.  Exam reveals no gallop and no friction rub.   No murmur heard. Pulmonary/Chest: Effort normal and breath sounds normal. No stridor. No respiratory distress. She has no wheezes. She has no rales. She exhibits no tenderness.  Abdominal: Soft. Bowel sounds are normal. She exhibits no distension and no mass. There is no tenderness. There is no rebound and no guarding.  Musculoskeletal: Normal range of motion. She exhibits no edema and no tenderness.  Lymphadenopathy:    She has no cervical adenopathy.  Neurological: She is oriented to person, place, and time.  Skin: Skin is warm and dry. No rash noted. She is not diaphoretic. No erythema. No pallor.  Psychiatric: She has a normal mood and affect. Her behavior is normal. Judgment and thought content normal.      Lab Results  Component Value Date   WBC 8.3 06/16/2011   HGB 13.4 06/16/2011   HCT 40.0 06/16/2011   PLT 249 06/16/2011   GLUCOSE 94 06/16/2011   CHOL 118 03/04/2011   TRIG 94 03/04/2011   HDL 37* 03/04/2011   LDLDIRECT 86.2 01/05/2010   LDLCALC 62 03/04/2011   ALT 27 06/16/2011   AST 30 06/16/2011   NA 141 06/16/2011   K 3.6 06/16/2011   CL 107 06/16/2011   CREATININE 0.72 06/16/2011   BUN 7 06/16/2011   CO2 22 06/16/2011   TSH 2.88 07/28/2010   INR 1.06 06/04/2011   HGBA1C  Value: 5.4 (NOTE)  According to the ADA Clinical Practice Recommendations for 2011, when HbA1c is used as a screening test:   >=6.5%   Diagnostic of Diabetes Mellitus           (if abnormal result  is confirmed)  5.7-6.4%   Increased risk of  developing Diabetes Mellitus  References:Diagnosis and Classification of Diabetes Mellitus,Diabetes Care,2011,34(Suppl 1):S62-S69 and Standards of Medical Care in         Diabetes - 2011,Diabetes Care,2011,34  (Suppl 1):S11-S61. 07/15/2010      Assessment & Plan:

## 2011-07-07 NOTE — Patient Instructions (Signed)
Diarrhea Infections caused by germs (bacterial) or a virus commonly cause diarrhea. Your caregiver has determined that with time, rest and fluids, the diarrhea should improve. In general, eat normally while drinking more water than usual. Although water may prevent dehydration, it does not contain salt and minerals (electrolytes). Broths, weak tea without caffeine and oral rehydration solutions (ORS) replace fluids and electrolytes. Small amounts of fluids should be taken frequently. Large amounts at one time may not be tolerated. Plain water may be harmful in infants and the elderly. Oral rehydrating solutions (ORS) are available at pharmacies and grocery stores. ORS replace water and important electrolytes in proper proportions. Sports drinks are not as effective as ORS and may be harmful due to sugars worsening diarrhea.  ORS is especially recommended for use in children with diarrhea. As a general guideline for children, replace any new fluid losses from diarrhea and/or vomiting with ORS as follows:   If your child weighs 22 pounds or under (10 kg or less), give 60-120 mL ( -  cup or 2 - 4 ounces) of ORS for each episode of diarrheal stool or vomiting episode.   If your child weighs more than 22 pounds (more than 10 kgs), give 120-240 mL ( - 1 cup or 4 - 8 ounces) of ORS for each diarrheal stool or episode of vomiting.   While correcting for dehydration, children should eat normally. However, foods high in sugar should be avoided because this may worsen diarrhea. Large amounts of carbonated soft drinks, juice, gelatin desserts and other highly sugared drinks should be avoided.   After correction of dehydration, other liquids that are appealing to the child may be added. Children should drink small amounts of fluids frequently and fluids should be increased as tolerated. Children should drink enough fluids to keep urine clear or pale yellow.   Adults should eat normally while drinking more fluids  than usual. Drink small amounts of fluids frequently and increase as tolerated. Drink enough fluids to keep urine clear or pale yellow. Broths, weak decaffeinated tea, lemon lime soft drinks (allowed to go flat) and ORS replace fluids and electrolytes.   Avoid:   Carbonated drinks.   Juice.   Extremely hot or cold fluids.   Caffeine drinks.   Fatty, greasy foods.   Alcohol.   Tobacco.   Too much intake of anything at one time.   Gelatin desserts.   Probiotics are active cultures of beneficial bacteria. They may lessen the amount and number of diarrheal stools in adults. Probiotics can be found in yogurt with active cultures and in supplements.   Wash hands well to avoid spreading bacteria and virus.   Anti-diarrheal medications are not recommended for infants and children.   Only take over-the-counter or prescription medicines for pain, discomfort or fever as directed by your caregiver. Do not give aspirin to children because it may cause Reye's Syndrome.   For adults, ask your caregiver if you should continue all prescribed and over-the-counter medicines.   If your caregiver has given you a follow-up appointment, it is very important to keep that appointment. Not keeping the appointment could result in a chronic or permanent injury, and disability. If there is any problem keeping the appointment, you must call back to this facility for assistance.  SEEK IMMEDIATE MEDICAL CARE IF:   You or your child is unable to keep fluids down or other symptoms or problems become worse in spite of treatment.   Vomiting or diarrhea develops and becomes persistent.     There is vomiting of blood or bile (green material).   There is blood in the stool or the stools are black and tarry.   There is no urine output in 6-8 hours or there is only a small amount of very dark urine.   Abdominal pain develops, increases or localizes.   You have a fever.   Your baby is older than 3 months with a  rectal temperature of 102 F (38.9 C) or higher.   Your baby is 3 months old or younger with a rectal temperature of 100.4 F (38 C) or higher.   You or your child develops excessive weakness, dizziness, fainting or extreme thirst.   You or your child develops a rash, stiff neck, severe headache or become irritable or sleepy and difficult to awaken.  MAKE SURE YOU:   Understand these instructions.   Will watch your condition.   Will get help right away if you are not doing well or get worse.  Document Released: 05/21/2002 Document Revised: 02/10/2011 Document Reviewed: 04/07/2009 ExitCare Patient Information 2012 [employer]. 

## 2011-07-07 NOTE — Assessment & Plan Note (Signed)
I will check her FLP today 

## 2011-07-08 ENCOUNTER — Other Ambulatory Visit: Payer: Self-pay | Admitting: Internal Medicine

## 2011-07-08 ENCOUNTER — Encounter: Payer: Self-pay | Admitting: Internal Medicine

## 2011-07-08 ENCOUNTER — Other Ambulatory Visit: Payer: Medicare Other

## 2011-07-08 DIAGNOSIS — E785 Hyperlipidemia, unspecified: Secondary | ICD-10-CM

## 2011-07-08 DIAGNOSIS — R197 Diarrhea, unspecified: Secondary | ICD-10-CM

## 2011-07-08 DIAGNOSIS — E876 Hypokalemia: Secondary | ICD-10-CM | POA: Insufficient documentation

## 2011-07-08 DIAGNOSIS — I10 Essential (primary) hypertension: Secondary | ICD-10-CM

## 2011-07-08 MED ORDER — POTASSIUM CHLORIDE CRYS ER 20 MEQ PO TBCR: 20.0000 meq | EXTENDED_RELEASE_TABLET | Freq: Two times a day (BID) | ORAL | Status: DC

## 2011-07-09 LAB — OVA AND PARASITE EXAMINATION: OP: NONE SEEN

## 2011-07-09 LAB — FECAL LACTOFERRIN, QUANT: Lactoferrin: POSITIVE

## 2011-07-13 ENCOUNTER — Encounter: Payer: Self-pay | Admitting: Cardiovascular Disease

## 2011-07-13 ENCOUNTER — Ambulatory Visit (INDEPENDENT_AMBULATORY_CARE_PROVIDER_SITE_OTHER): Payer: Medicare Other | Admitting: Cardiovascular Disease

## 2011-07-13 VITALS — BP 108/72 | HR 79 | Wt 187.0 lb

## 2011-07-13 DIAGNOSIS — I2510 Atherosclerotic heart disease of native coronary artery without angina pectoris: Secondary | ICD-10-CM

## 2011-07-13 NOTE — Progress Notes (Signed)
History of Present Illness: 56 y.o. WF with history of CAD, chronic chest pain, COPD, Barrett's esophagitis, HTN, HLD, bipolar disorder, reflex sympathetic dystrophy, previous suicidal ideations and attempt, COPD, tobacco abuse here today for cardiac follow up.  I saw her as a new patient in December 2012. She has been followed in the past by Dr. Gala Romney. Her cardiac history includes previous stenting of the RCA and left circumflex complicated by in-stent restenosis of her RCA back in 2009. At that time, she underwent drug-eluting stent to her RCA. Last myoview was in 12/2009 and demonstrated an LVEF 74% with no ischemia or infarct. She was admitted 2/12 with chest pain and cath 07/16/10 demonstrated LAD 20-30%; CFX 20-30%, mCFX stent with 20-30% ISR; mRCA stent with 30-40% ISR, dRCA 30-40%; EF 55-60%. Her GI issues had been followed by Lina Sar. Her last EGD did demonstrate Barrett's Esophagus.Her COPD is followed by Dr. Danise Mina. She was admitted for depression to [facility] in the Fall of 2012 but has done well since discharge. At the first visit here last month, she had c/o chronic chest pains. I started Imdur 30 mg po Qdaily. She did not tolerate the Imdur and stopped this medication.    She had followup lipids and LFTs 03/04/11: LFTs normal, TC 118, HDL 37, LDL 62.   Since I saw her last month, she has been admitted to [facility] for depression and suicidal ideations. Her Trazodone dose was lowered to 150 mg po once daily. Zoloft was also increased. She was weaned off of Neurontin. She stopped Imdur because her blood pressure was dropping with this medication. She reports blood pressures in the 80s. She is feeling ok. She has had no change in her chest pains or breathing. She recently saw Dr. Yetta Barre for diarrhea. She tells me that she will not see Dr. Lina Sar again because she was unhappy with being awake during her EGD. She plans to see a GI doctor in [facility] next month. She  also did not like the way she was treated in [place]. She has multiple complaints today about many of the healthcare providers she has encountered over the last few years.     Past Medical History  Diagnosis Date  . CAD (coronary artery disease)     s/p BMS to RCA and LCX 2008. In-stent restenosis of RCA stent rx'd with Xience DES 12/09. Requires extensive sedation for cath. cath 11/10: LAD mild plaque. LCX 10% RCA 50% ISR (FFR 0.91/0.92) Distal RCA 40/50. Cath 2/12: LAD 20-30; CFX 20-30, stent with 20-30; RCA stent with 30-40 and dRCA 30-40; EF 55-60%;  06/04/2011 - Nonobstructive Cath  . Chronic chest pain   . Anxiety and depression   . H/O: suicide attempt     drug overdose 3/08  . Hypertension   . Obesity   . Tobacco abuse   . H/O ETOH abuse   . Acid reflux   . Arthritis   . Hyperlipidemia   . Pneumonia   . UTI (lower urinary tract infection)   . Colitis   . GERD (gastroesophageal reflux disease)   . COPD (chronic obstructive pulmonary disease)     Past Surgical History  Procedure Date  . Ptca     stent  . Colon biopsy 08/12/2008  . Tubal ligation 1982  . Cesarean section     x 3  . Carpal tunnel release     Current Outpatient Prescriptions  Medication Sig Dispense Refill  . albuterol (PROAIR  HFA) 108 (90 BASE) MCG/ACT inhaler Inhale 2 puffs into the lungs every 4 (four) hours as needed for wheezing or shortness of breath.  1 Inhaler  0  . Ascorbic Acid (VITAMIN C) 100 MG tablet Take 100 mg by mouth daily.      Marland Kitchen aspirin EC 81 MG tablet Take 81 mg by mouth every morning.        Marland Kitchen atorvastatin (LIPITOR) 80 MG tablet Take 80 mg by mouth at bedtime. She takes every night at 9pm.      . calcium carbonate 1250 MG capsule Take by mouth daily.      . Cholecalciferol (VITAMIN D3) 5000 UNITS TABS Take 1 tablet by mouth every morning.        . clonazePAM (KLONOPIN) 1 MG tablet Take 1 mg by mouth 3 (three) times daily as needed. For anxiety      . clopidogrel (PLAVIX)  75 MG tablet Take 75 mg by mouth every morning.        Marland Kitchen esomeprazole (NEXIUM) 40 MG capsule Take 40 mg by mouth daily before breakfast. She takes daily at 6am.       . fish oil-omega-3 fatty acids 1000 MG capsule Take 2 g by mouth 2 (two) times daily with a meal.       . Glucos-MSM-C-Mn-Ginger-Willow (GLUCOSAMINE MSM COMPLEX) TABS Take 1 tablet by mouth 3 (three) times daily with meals.       Marland Kitchen loratadine (CLARITIN) 10 MG tablet Take 10 mg by mouth every morning.       . metroNIDAZOLE (FLAGYL) 250 MG tablet Take 1 tablet (250 mg total) by mouth 3 (three) times daily.  21 tablet  1  . Multiple Vitamins-Minerals (MULTIVITAMIN WITH MINERALS) tablet Take 1 tablet by mouth every morning.       . nitroGLYCERIN (NITROSTAT) 0.4 MG SL tablet Place 0.4 mg under the tongue every 5 (five) minutes as needed. For chest pain      . olmesartan (BENICAR) 5 MG tablet Take 5 mg by mouth daily.      Marland Kitchen OVER THE COUNTER MEDICATION Liquiod vitamin b complex with vitamin b12      . potassium chloride SA (K-DUR,KLOR-CON) 20 MEQ tablet Take 1 tablet (20 mEq total) by mouth 2 (two) times daily.  60 tablet  3  . sertraline (ZOLOFT) 50 MG tablet Take 3 tablets (150 mg total) by mouth daily.  90 tablet  0  . tiotropium (SPIRIVA) 18 MCG inhalation capsule Place 18 mcg into inhaler and inhale every morning.       . topiramate (TOPAMAX) 50 MG tablet Take 50 mg by mouth 2 (two) times daily.      . traZODone (DESYREL) 150 MG tablet Take 1 tablet (150 mg total) by mouth at bedtime.  30 tablet  0    Allergies  Allergen Reactions  . Prednisone     psychosis    History   Social History  . Marital Status: Divorced    Spouse Name: N/A    Number of Children: 3  . Years of Education: N/A   Occupational History  .      Workmen's comp   Social History Main Topics  . Smoking status: Former Smoker -- 1.0 packs/day for 35 years    Types: Cigarettes    Quit date: 02/23/2010  . Smokeless tobacco: Never Used  . Alcohol Use:  No     rarely  . Drug Use: No  . Sexually Active: Not Currently  Other Topics Concern  . Not on file   Social History Narrative  . No narrative on file    Family History  Problem Relation Age of Onset  . Coronary artery disease Mother   . Emphysema Mother   . Colon cancer Father   . Prostate cancer Father     Review of Systems:  As stated in the HPI and otherwise negative.   BP 108/72  Pulse 79  Wt 187 lb (84.823 kg)  Physical Examination: General: Well developed, well nourished, NAD HEENT: OP clear, mucus membranes moist SKIN: warm, dry. No rashes. Neuro: No focal deficits Musculoskeletal: Muscle strength 5/5 all ext Psychiatric: Mood and affect normal Neck: No JVD, no carotid bruits, no thyromegaly, no lymphadenopathy. Lungs:Clear bilaterally, no wheezes, rhonci, crackles Cardiovascular: Regular rate and rhythm. No murmurs, gallops or rubs. Abdomen:Soft. Bowel sounds present. Non-tender.  Extremities: No lower extremity edema. Pulses are 2 + in the bilateral DP/PT.

## 2011-07-13 NOTE — Assessment & Plan Note (Signed)
Stable. No changes. Will not change medications today.

## 2011-07-13 NOTE — Patient Instructions (Signed)
Your physician wants you to follow-up in:  6 months. You will receive a reminder letter in the mail two months in advance. If you don't receive a letter, please call our office to schedule the follow-up appointment.   

## 2011-07-14 LAB — CLOSTRIDIUM DIFFICILE CULTURE-FECAL

## 2011-07-19 ENCOUNTER — Other Ambulatory Visit: Payer: Self-pay | Admitting: Cardiovascular Disease

## 2011-07-19 MED ORDER — NITROGLYCERIN 0.4 MG SL SUBL: 0.4000 mg | SUBLINGUAL_TABLET | SUBLINGUAL | Status: DC | PRN

## 2011-07-19 MED ORDER — CLOPIDOGREL BISULFATE 75 MG PO TABS: 75.0000 mg | ORAL_TABLET | ORAL | Status: DC

## 2011-07-19 NOTE — Telephone Encounter (Signed)
Pt wants 90 day supply

## 2011-07-21 ENCOUNTER — Ambulatory Visit: Payer: Self-pay | Admitting: Internal Medicine

## 2011-07-22 ENCOUNTER — Telehealth: Payer: Self-pay | Admitting: *Deleted

## 2011-07-22 NOTE — Telephone Encounter (Signed)
She can hold Plavix for 7 days before her procedure and then restart when safe afterwards. Thanks, chris

## 2011-07-22 NOTE — Telephone Encounter (Signed)
Message on Triage phone from Frank at [facility] that pt is being scheduled for colonoscopy by Dr. Loman Chroman. They are asking how many days pt can be off Plavix prior to procedure.  Call back number is [phone].

## 2011-07-23 NOTE — Telephone Encounter (Signed)
Spoke with Gordy Councilman, RN at [facility] and gave her information from Dr. Clifton James

## 2011-07-27 ENCOUNTER — Ambulatory Visit (INDEPENDENT_AMBULATORY_CARE_PROVIDER_SITE_OTHER): Payer: Medicare Other | Admitting: Internal Medicine

## 2011-07-27 ENCOUNTER — Other Ambulatory Visit (INDEPENDENT_AMBULATORY_CARE_PROVIDER_SITE_OTHER): Payer: Medicare Other

## 2011-07-27 ENCOUNTER — Encounter: Payer: Self-pay | Admitting: Internal Medicine

## 2011-07-27 DIAGNOSIS — E039 Hypothyroidism, unspecified: Secondary | ICD-10-CM

## 2011-07-27 DIAGNOSIS — E876 Hypokalemia: Secondary | ICD-10-CM

## 2011-07-27 DIAGNOSIS — I10 Essential (primary) hypertension: Secondary | ICD-10-CM

## 2011-07-27 DIAGNOSIS — R197 Diarrhea, unspecified: Secondary | ICD-10-CM

## 2011-07-27 LAB — BASIC METABOLIC PANEL
CO2: 29 mEq/L (ref 19–32)
Calcium: 9.5 mg/dL (ref 8.4–10.5)
Chloride: 102 mEq/L (ref 96–112)
Glucose, Bld: 100 mg/dL — ABNORMAL HIGH (ref 70–99)
Sodium: 138 mEq/L (ref 135–145)

## 2011-07-27 MED ORDER — LEVOTHYROXINE SODIUM 13 MCG PO CAPS: 1.0000 | ORAL_CAPSULE | Freq: Every day | ORAL | Status: DC

## 2011-07-27 NOTE — Assessment & Plan Note (Signed)
BP is well controlled 

## 2011-07-27 NOTE — Assessment & Plan Note (Signed)
Recheck TFT's and start tirosint

## 2011-07-27 NOTE — Patient Instructions (Signed)
Hypertension As your heart beats, it forces blood through your arteries. This force is your blood pressure. If the pressure is too high, it is called hypertension (HTN) or high blood pressure. HTN is dangerous because you may have it and not know it. High blood pressure may mean that your heart has to work harder to pump blood. Your arteries may be narrow or stiff. The extra work puts you at risk for heart disease, stroke, and other problems.  Blood pressure consists of two numbers, a higher number over a lower, 110/72, for example. It is stated as "110 over 72." The ideal is below 120 for the top number (systolic) and under 80 for the bottom (diastolic). Write down your blood pressure today. You should pay close attention to your blood pressure if you have certain conditions such as:  Heart failure.   Prior heart attack.   Diabetes   Chronic kidney disease.   Prior stroke.   Multiple risk factors for heart disease.  To see if you have HTN, your blood pressure should be measured while you are seated with your arm held at the level of the heart. It should be measured at least twice. A one-time elevated blood pressure reading (especially in the Emergency Department) does not mean that you need treatment. There may be conditions in which the blood pressure is different between your right and left arms. It is important to see your caregiver soon for a recheck. Most people have essential hypertension which means that there is not a specific cause. This type of high blood pressure may be lowered by changing lifestyle factors such as:  Stress.   Smoking.   Lack of exercise.   Excessive weight.   Drug/tobacco/alcohol use.   Eating less salt.  Most people do not have symptoms from high blood pressure until it has caused damage to the body. Effective treatment can often prevent, delay or reduce that damage. TREATMENT  When a cause has been identified, treatment for high blood pressure is  directed at the cause. There are a large number of medications to treat HTN. These fall into several categories, and your caregiver will help you select the medicines that are best for you. Medications may have side effects. You should review side effects with your caregiver. If your blood pressure stays high after you have made lifestyle changes or started on medicines,   Your medication(s) may need to be changed.   Other problems may need to be addressed.   Be certain you understand your prescriptions, and know how and when to take your medicine.   Be sure to follow up with your caregiver within the time frame advised (usually within two weeks) to have your blood pressure rechecked and to review your medications.   If you are taking more than one medicine to lower your blood pressure, make sure you know how and at what times they should be taken. Taking two medicines at the same time can result in blood pressure that is too low.  SEEK IMMEDIATE MEDICAL CARE IF:  You develop a severe headache, blurred or changing vision, or confusion.   You have unusual weakness or numbness, or a faint feeling.   You have severe chest or abdominal pain, vomiting, or breathing problems.  MAKE SURE YOU:   Understand these instructions.   Will watch your condition.   Will get help right away if you are not doing well or get worse.  Document Released: 05/31/2005 Document Revised: 02/10/2011 Document Reviewed:   01/19/2008 [employer] Patient Information 2012 [place], [place].Hypothyroidism The thyroid is a large gland located in the lower front of your neck. The thyroid gland helps control metabolism. Metabolism is how your body handles food. It controls metabolism with the hormone thyroxine. When this gland is underactive (hypothyroid), it produces too little hormone.  CAUSES These include:   Absence or destruction of thyroid tissue.   Goiter due to iodine deficiency.   Goiter due to medications.    Congenital defects (since birth).   Problems with the pituitary. This causes a lack of TSH (thyroid stimulating hormone). This hormone tells the thyroid to turn out more hormone.  SYMPTOMS  Lethargy (feeling as though you have no energy)   Cold intolerance   Weight gain (in spite of normal food intake)   Dry skin   Coarse hair   Menstrual irregularity (if severe, may lead to infertility)   Slowing of thought processes  Cardiac problems are also caused by insufficient amounts of thyroid hormone. Hypothyroidism in the newborn is cretinism, and is an extreme form. It is important that this form be treated adequately and immediately or it will lead rapidly to retarded physical and mental development. DIAGNOSIS  To prove hypothyroidism, your caregiver may do blood tests and ultrasound tests. Sometimes the signs are hidden. It may be necessary for your caregiver to watch this illness with blood tests either before or after diagnosis and treatment. TREATMENT  Low levels of thyroid hormone are increased by using synthetic thyroid hormone. This is a safe, effective treatment. It usually takes about four weeks to gain the full effects of the medication. After you have the full effect of the medication, it will generally take another four weeks for problems to leave. Your caregiver may start you on low doses. If you have had heart problems the dose may be gradually increased. It is generally not an emergency to get rapidly to normal. HOME CARE INSTRUCTIONS   Take your medications as your caregiver suggests. Let your caregiver know of any medications you are taking or start taking. Your caregiver will help you with dosage schedules.   As your condition improves, your dosage needs may increase. It will be necessary to have continuing blood tests as suggested by your caregiver.   Report all suspected medication side effects to your caregiver.  SEEK MEDICAL CARE IF: Seek medical care if you  develop:  Sweating.   Tremulousness (tremors).   Anxiety.   Rapid weight loss.   Heat intolerance.   Emotional swings.   Diarrhea.   Weakness.  SEEK IMMEDIATE MEDICAL CARE IF:  You develop chest pain, an irregular heart beat (palpitations), or a rapid heart beat. MAKE SURE YOU:   Understand these instructions.   Will watch your condition.   Will get help right away if you are not doing well or get worse.  Document Released: 05/31/2005 Document Revised: 02/10/2011 Document Reviewed: 01/19/2008 [facility] Patient Information 2012 [place], [place].

## 2011-07-27 NOTE — Assessment & Plan Note (Signed)
Check BMP and Mg ++ level today

## 2011-07-27 NOTE — Progress Notes (Signed)
Subjective:    Patient ID: Kara Lamb, female    DOB: [birth year], 56 y.o.   MRN: [id number]  Thyroid Problem Presents for initial visit. Onset time: she saw a GI doc in [place] one week ago and she told that her TSH=15. Symptoms include cold intolerance, constipation, diarrhea, dry skin, fatigue, hair loss, nail problem and weight gain. Patient reports no anxiety, depressed mood, diaphoresis, heat intolerance, hoarse voice, leg swelling, palpitations, tremors, visual change or weight loss. The symptoms have been stable. Past treatments include nothing. The following procedures have not been performed: radioiodine uptake scan, thyroid FNA, thyroid ultrasound and thyroidectomy.  Hypertension This is a chronic problem. The current episode started more than 1 year ago. The problem has been gradually improving since onset. The problem is controlled. Pertinent negatives include no anxiety, blurred vision, chest pain, headaches, malaise/fatigue, neck pain, orthopnea, palpitations, peripheral edema, PND, shortness of breath or sweats. Past treatments include angiotensin blockers. The current treatment provides significant improvement. Compliance problems include exercise and diet.  Hypertensive end-organ damage includes a thyroid problem.      Review of Systems  Constitutional: Positive for weight gain and fatigue. Negative for weight loss, malaise/fatigue and diaphoresis.  HENT: Negative.  Negative for hoarse voice and neck pain.   Eyes: Negative.  Negative for blurred vision.  Respiratory: Negative.  Negative for shortness of breath.   Cardiovascular: Negative.  Negative for chest pain, palpitations, orthopnea and PND.  Gastrointestinal: Positive for diarrhea and constipation.  Genitourinary: Negative.   Musculoskeletal: Negative.   Skin: Negative.   Neurological: Negative.  Negative for tremors and headaches.  Hematological: Positive for cold intolerance. Negative for heat intolerance.    Psychiatric/Behavioral: Negative.        Objective:   Physical Exam  Vitals reviewed. Constitutional: She is oriented to person, place, and time. She appears well-developed and well-nourished. No distress.  HENT:  Head: Normocephalic and atraumatic.  Mouth/Throat: Oropharynx is clear and moist. No oropharyngeal exudate.  Eyes: Conjunctivae are normal. Right eye exhibits no discharge. Left eye exhibits no discharge. No scleral icterus.  Neck: Normal range of motion. Neck supple. No JVD present. No tracheal deviation present. No thyromegaly present.  Cardiovascular: Normal rate, regular rhythm, normal heart sounds and intact distal pulses.  Exam reveals no gallop and no friction rub.   No murmur heard. Pulmonary/Chest: Effort normal and breath sounds normal. No stridor. No respiratory distress. She has no wheezes. She has no rales. She exhibits no tenderness.  Abdominal: Soft. Bowel sounds are normal. She exhibits no distension and no mass. There is no tenderness. There is no rebound and no guarding.  Musculoskeletal: Normal range of motion. She exhibits no edema and no tenderness.  Lymphadenopathy:    She has no cervical adenopathy.  Neurological: She is oriented to person, place, and time.  Skin: Skin is warm and dry. No rash noted. She is not diaphoretic. No erythema. No pallor.  Psychiatric: She has a normal mood and affect. Her behavior is normal. Judgment and thought content normal.      Lab Results  Component Value Date   WBC 7.8 07/07/2011   HGB 13.0 07/07/2011   HCT 37.4 07/07/2011   PLT 274.0 07/07/2011   GLUCOSE 99 07/07/2011   CHOL 128 07/07/2011   TRIG 88.0 07/07/2011   HDL 40.90 07/07/2011   LDLDIRECT 86.2 01/05/2010   LDLCALC 70 07/07/2011   ALT 26 07/07/2011   AST 33 07/07/2011   NA 140 07/07/2011   K 3.3*  07/07/2011   CL 105 07/07/2011   CREATININE 0.8 07/07/2011   BUN 12 07/07/2011   CO2 26 07/07/2011   TSH 2.88 07/28/2010   INR 1.06 06/04/2011   HGBA1C  Value: 5.4  (NOTE)                                                                       According to the ADA Clinical Practice Recommendations for 2011, when HbA1c is used as a screening test:   >=6.5%   Diagnostic of Diabetes Mellitus           (if abnormal result  is confirmed)  5.7-6.4%   Increased risk of developing Diabetes Mellitus  References:Diagnosis and Classification of Diabetes Mellitus,Diabetes Care,2011,34(Suppl 1):S62-S69 and Standards of Medical Care in         Diabetes - 2011,Diabetes Care,2011,34  (Suppl 1):S11-S61. 07/15/2010      Assessment & Plan:

## 2011-07-27 NOTE — Assessment & Plan Note (Signed)
She has seen ?GI in [place] and has had more stool studies and labs done and is scheduled for a colonoscopy in 2 weeks

## 2011-07-28 ENCOUNTER — Telehealth: Payer: Self-pay

## 2011-07-28 ENCOUNTER — Telehealth: Payer: Self-pay | Admitting: Cardiovascular Disease

## 2011-07-28 NOTE — Telephone Encounter (Signed)
Patient called Kara Lamb stating that she was seen in the office and started on levothyroxine 13 mcg. She went home and goggled/researced and read were pt with hx of heart attack shouldn't take this medication. She is requesting MD advisement Thanks

## 2011-07-28 NOTE — Telephone Encounter (Signed)
I don't think her info is correct

## 2011-07-28 NOTE — Telephone Encounter (Signed)
New Problem   Patient would like a return call regarding Levothyroxine sodium tabs, as she has questions about the medication. Wants to know if this med is a threat to her as she has read some of the known side affects, She can be reached at hm#    Also, does patient need 6 mo f/u with [facility], last visit noted [facility] 06/2011

## 2011-07-28 NOTE — Telephone Encounter (Signed)
Spoke with pt and told her it was OK to start levothyroxine from a cardiac standpoint.  I told her to make sure she keeps follow up appts with primary care so thyroid labs could be checked as needed.  Pt aware she is due for follow up with Dr. Clifton James in July.

## 2011-08-12 ENCOUNTER — Ambulatory Visit: Payer: Self-pay | Admitting: Internal Medicine

## 2011-09-01 ENCOUNTER — Telehealth: Payer: Self-pay | Admitting: Cardiovascular Disease

## 2011-09-01 NOTE — Telephone Encounter (Signed)
She can stop Plavix 5 days before surgery and restart when safe from her surgeons perspective following surgery. Thanks, chris

## 2011-09-01 NOTE — Telephone Encounter (Signed)
New problem:  Patient calling upcoming shoulder surgery . Please advise on how many days patient need to be off on plavix.

## 2011-09-01 NOTE — Telephone Encounter (Signed)
Spoke with pt who reports she had MRI done yesterday and needs to have rotator cuff surgery. Dr. Ophelia Charter is surgeon and per pt he would like to proceed with surgery as soon as possible. Is not scheduled yet.  Pt is asking if she can stop Plavix prior to surgery and if so how many days ahead of time can she stop it?

## 2011-09-01 NOTE — Telephone Encounter (Signed)
Spoke with pt and gave her information from Dr. McAlhany 

## 2011-09-02 ENCOUNTER — Ambulatory Visit: Payer: Self-pay | Admitting: Critical Care Medicine

## 2011-09-03 ENCOUNTER — Telehealth: Payer: Self-pay | Admitting: Cardiovascular Disease

## 2011-09-03 NOTE — Telephone Encounter (Signed)
Pt is having rotator cuff surgery by Dr. Annell Greening. His office will be sending clearance form for Dr. Clifton James to sign She would also like a copy of her December cath report sent to Dr. Ophelia Charter. I will forward to Dr. Clifton James & Barbee Shropshire RN

## 2011-09-03 NOTE — Telephone Encounter (Signed)
New msg Pt called and said she wants to talk to you about her diagnosis. Please call

## 2011-09-06 ENCOUNTER — Encounter: Payer: Self-pay | Admitting: Cardiovascular Disease

## 2011-09-06 ENCOUNTER — Telehealth: Payer: Self-pay | Admitting: Cardiovascular Disease

## 2011-09-06 NOTE — Telephone Encounter (Signed)
Called to let pt know information had been faxed to Dr. Ophelia Charter. Left message to call back.

## 2011-09-06 NOTE — Telephone Encounter (Signed)
FU Call: Pt returning call from Pat. Please return pt call to discuss further.  

## 2011-09-06 NOTE — Telephone Encounter (Signed)
Spoke with pt and told her cath report and letter from Dr. Clifton James had been sent to Dr. Ophelia Charter

## 2011-09-06 NOTE — Telephone Encounter (Signed)
Letter is written and printed and will be forwarded to Dr. Ophelia Charter. cdma

## 2011-09-06 NOTE — Telephone Encounter (Signed)
Pt has been notified.

## 2011-09-29 ENCOUNTER — Ambulatory Visit: Payer: Self-pay | Admitting: Critical Care Medicine

## 2011-10-13 ENCOUNTER — Ambulatory Visit: Payer: Self-pay | Admitting: Internal Medicine

## 2011-10-15 ENCOUNTER — Encounter: Payer: Self-pay | Admitting: Internal Medicine

## 2011-10-15 ENCOUNTER — Ambulatory Visit (INDEPENDENT_AMBULATORY_CARE_PROVIDER_SITE_OTHER): Payer: Medicare Other | Admitting: Internal Medicine

## 2011-10-15 ENCOUNTER — Telehealth: Payer: Self-pay | Admitting: Critical Care Medicine

## 2011-10-15 ENCOUNTER — Other Ambulatory Visit: Payer: Self-pay | Admitting: Emergency Medicine

## 2011-10-15 VITALS — BP 100/62 | HR 83 | Temp 98.1°F | Ht 61.0 in | Wt 186.0 lb

## 2011-10-15 DIAGNOSIS — J441 Chronic obstructive pulmonary disease with (acute) exacerbation: Secondary | ICD-10-CM

## 2011-10-15 DIAGNOSIS — J449 Chronic obstructive pulmonary disease, unspecified: Secondary | ICD-10-CM

## 2011-10-15 MED ORDER — DOXYCYCLINE HYCLATE 100 MG PO TABS: 100.0000 mg | ORAL_TABLET | Freq: Two times a day (BID) | ORAL | Status: AC

## 2011-10-15 NOTE — Telephone Encounter (Signed)
Called, spoke with pt who c/o "rough" throat, nonprod cough, SOB, states it hurts to breath in, and ribcage pain.  Symtoms started approx 2 wks ago.  Requesting OV - scheduled with MR for today at 4:15 pm -- pt aware.

## 2011-10-15 NOTE — Progress Notes (Signed)
Subjective:    Patient ID: Kara Lamb, female    DOB: 1956-01-15, 56 y.o.   MRN: [id number]  HPI  56 y.o.   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 No steroids  # Pysch history (admission [facility] June, Sept, Oct, 2012) and multiple psych drugs. Depression. Hx of suicidal attempts     ACUTE OV 10/15/2011 with Dr Marchelle Gearing  - Patient of Dr Delford Field with above issues. Lase seent by Dr Delford Field dec 2012    - Currently, reporting 2 weeks of insidious onset of progressive dyspnea on exertion relived by rest and associated with chest tightness, increased cough. There is not change in sputum volume or color.   -  CAT score: 30 and reflective of high burden and high risk copd (level 4 cough, level 2.5 mucus, level 4 chest tightness, leverl 5 dyspnea stairs, level 4 ADL difficulty, level 3 confidence leaving home aline, level 3 sleep issues, and level 3.5 fatigue)   - Spirometry fev1 0.68L/29%, FVC 1.82L/64% - cw very severe obstruction and significantly worse than reported in her baseline  - She is adamantly refusing systemic steroids   Current outpatient prescriptions:albuterol (PROAIR HFA) 108 (90 BASE) MCG/ACT inhaler, Inhale 2 puffs into the lungs every 4 (four) hours as needed for wheezing or shortness of breath., Disp: 1 Inhaler, Rfl: 0;  Ascorbic Acid (VITAMIN C) 100 MG tablet, Take 100 mg by mouth daily., Disp: , Rfl: ;  aspirin EC 81 MG tablet, Take 81 mg by mouth every morning.  , Disp: , Rfl:  atorvastatin (LIPITOR) 80 MG tablet, Take 80 mg by mouth at bedtime. She takes every night at 9pm., Disp: , Rfl: ;  Bismuth Subsalicylate (PEPTO-BISMOL) 262 MG TABS, Take by mouth. Take daily x 8wks, Disp: , Rfl: ;  calcium carbonate 1250 MG capsule, Take by mouth daily., Disp: , Rfl: ;  Cholecalciferol (VITAMIN D3) 5000 UNITS TABS, Take 1  tablet by mouth every morning.  , Disp: , Rfl:  clonazePAM (KLONOPIN) 1 MG tablet, Take 1 mg by mouth 3 (three) times daily as needed. For anxiety, Disp: , Rfl: ;  clopidogrel (PLAVIX) 75 MG tablet, Take 1 tablet (75 mg total) by mouth every morning., Disp: 30 tablet, Rfl: 12;  esomeprazole (NEXIUM) 40 MG capsule, Take 40 mg by mouth daily before breakfast. She takes daily at 6am. , Disp: , Rfl:  fish oil-omega-3 fatty acids 1000 MG capsule, Take 2 g by mouth 2 (two) times daily with a meal. , Disp: , Rfl: ;  FLUoxetine (PROZAC) 40 MG capsule, Take 40 mg by mouth daily., Disp: , Rfl: ;  levothyroxine (SYNTHROID, LEVOTHROID) 25 MCG tablet, Take 25 mcg by mouth daily., Disp: , Rfl: ;  loratadine (CLARITIN) 10 MG tablet, Take 10 mg by mouth every morning. , Disp: , Rfl:  Multiple Vitamins-Minerals (MULTIVITAMIN WITH MINERALS) tablet, Take 1 tablet by mouth every morning. , Disp: , Rfl: ;  nitroGLYCERIN (NITROSTAT) 0.4 MG SL tablet, Place 1 tablet (0.4 mg total) under the tongue every 5 (five) minutes as needed. For chest pain, Disp: 25 tablet, Rfl: 12;  olmesartan (BENICAR) 5 MG tablet, Take 5 mg by mouth daily., Disp: , Rfl: ;  OVER THE COUNTER MEDICATION, Liquiod vitamin b complex with vitamin b12, Disp: , Rfl:  potassium chloride SA (K-DUR,KLOR-CON) 20 MEQ tablet, Take 1 tablet (20 mEq total) by mouth 2 (two) times daily., Disp: 60 tablet, Rfl: 3;  Probiotic Product (PROBIOTIC PO), Take by mouth., Disp: , Rfl: ;  tiotropium (SPIRIVA) 18 MCG inhalation capsule, Place 18 mcg into inhaler and inhale every morning. , Disp: , Rfl: ;  topiramate (TOPAMAX) 50 MG tablet, Take 100 mg by mouth 2 (two) times daily. , Disp: , Rfl:  traZODone (DESYREL) 150 MG tablet, Take 150 mg by mouth at bedtime., Disp: , Rfl: ;  traZODone (DESYREL) 150 MG tablet, Take 1 tablet (150 mg total) by mouth at bedtime., Disp: 30 tablet, Rfl: 0   Review of Systems  Constitutional: Negative for fever and unexpected weight change.  HENT:  Positive for congestion, sneezing and trouble swallowing. Negative for ear pain, nosebleeds, sore throat, rhinorrhea, dental problem, postnasal drip and sinus pressure.   Eyes: Negative for redness and itching.  Respiratory: Positive for cough, chest tightness, shortness of breath and wheezing.   Cardiovascular: Negative for palpitations and leg swelling.  Gastrointestinal: Positive for nausea. Negative for vomiting.  Genitourinary: Negative for dysuria.  Musculoskeletal: Negative for joint swelling.  Skin: Negative for rash.  Neurological: Negative for headaches.  Hematological: Does not bruise/bleed easily.  Psychiatric/Behavioral: Negative for dysphoric mood. The patient is nervous/anxious.        Objective:   Physical Exam  Vitals reviewed. Constitutional: She is oriented to person, place, and time. She appears well-developed and well-nourished. No distress.       Obese Body mass index is 35.14 kg/(m^2).   HENT:  Head: Normocephalic and atraumatic.  Right Ear: External ear normal.  Left Ear: External ear normal.  Mouth/Throat: Oropharynx is clear and moist. No oropharyngeal exudate.  Eyes: Conjunctivae and EOM are normal. Pupils are equal, round, and reactive to light. Right eye exhibits no discharge. Left eye exhibits no discharge. No scleral icterus.  Neck: Normal range of motion. Neck supple. No JVD present. No tracheal deviation present. No thyromegaly present.  Cardiovascular: Normal rate, regular rhythm, normal heart sounds and intact distal pulses.  Exam reveals no gallop and no friction rub.   No murmur heard. Pulmonary/Chest: Effort normal. No respiratory distress. She has wheezes. She has no rales. She exhibits no tenderness.       Very faint, very occassional forced end exp wheeze  Abdominal: Soft. Bowel sounds are normal. She exhibits no distension and no mass. There is no tenderness. There is no rebound and no guarding.  Musculoskeletal: Normal range of motion. She  exhibits no edema and no tenderness.  Lymphadenopathy:    She has no cervical adenopathy.  Neurological: She is alert and oriented to person, place, and time. She has normal reflexes. No cranial nerve deficit. She exhibits normal muscle tone. Coordination normal.  Skin: Skin is warm and dry. No rash noted. She is not diaphoretic. No erythema. No pallor.  Psychiatric: Her behavior is normal. Judgment normal.       Flat affect

## 2011-10-15 NOTE — Telephone Encounter (Signed)
Pt seen by Dr. Marchelle Gearing earlier today for AECOPD.  When she went to pharmacy she found she did not have refill for proair.  I have sent refill for proair.

## 2011-10-15 NOTE — Patient Instructions (Signed)
#  COPD attack  - You have mild attack of copd called COPD exacerbation - Please take doxycycline 100mg  twice daily after meals x 5 days; avoid sunlight - As discussed we are going to hold off steroids due to your concern about side effects - your last CT chest was March 2012 and was clear . Therefore, I do not see a reason for CXR today. You can discuss this with Dr Delford Field at followup - Please have spirometry breathing test today - Continue spiriva 1 puff daily and albuterol as needed - If you get worse, call or come sooner  #Followup   - Dr Delford Field or NP in 1 month - CAT score at followup

## 2011-10-16 ENCOUNTER — Encounter: Payer: Self-pay | Admitting: Internal Medicine

## 2011-10-16 DIAGNOSIS — J441 Chronic obstructive pulmonary disease with (acute) exacerbation: Secondary | ICD-10-CM | POA: Insufficient documentation

## 2011-10-16 NOTE — Assessment & Plan Note (Signed)
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 We discussed some prednisone aleternatives like methylprednisone but she refused it all despite her spirometry showing very severe obstruction due to her bipolar issues  - So we decided to  As discussed we are going to hold off steroids due to your concern about side effects (we disc  - your last CT chest was March 2012 and was clear . Therefore, I do not see a reason for CXR today. You can discuss this with Dr Delford Field at  followup  -- Continue spiriva 1 puff daily and albuterol as needed - If you get worse, call or come sooner  #Followup   - Dr Delford Field or NP in 1 month - CAT score at followu  > 50% of this > 25 min visit spent in face to face counseling (15 min visit converted to 25 min)

## 2011-10-18 ENCOUNTER — Ambulatory Visit: Payer: Medicare Other | Attending: Orthopaedic Surgery

## 2011-10-18 DIAGNOSIS — IMO0001 Reserved for inherently not codable concepts without codable children: Secondary | ICD-10-CM | POA: Insufficient documentation

## 2011-10-18 DIAGNOSIS — M25619 Stiffness of unspecified shoulder, not elsewhere classified: Secondary | ICD-10-CM | POA: Insufficient documentation

## 2011-10-18 DIAGNOSIS — M25519 Pain in unspecified shoulder: Secondary | ICD-10-CM | POA: Insufficient documentation

## 2011-10-18 NOTE — Telephone Encounter (Signed)
Noted by triage. Will sign off on message. 

## 2011-10-20 ENCOUNTER — Ambulatory Visit: Payer: Medicare Other

## 2011-10-25 ENCOUNTER — Ambulatory Visit: Payer: Self-pay | Admitting: Internal Medicine

## 2011-11-03 ENCOUNTER — Ambulatory Visit: Payer: Self-pay

## 2011-11-09 ENCOUNTER — Ambulatory Visit: Payer: Self-pay | Admitting: Critical Care Medicine

## 2011-11-22 ENCOUNTER — Encounter: Payer: Self-pay | Admitting: *Deleted

## 2011-11-22 ENCOUNTER — Encounter: Payer: Self-pay | Admitting: Adult Health

## 2011-11-22 ENCOUNTER — Ambulatory Visit (INDEPENDENT_AMBULATORY_CARE_PROVIDER_SITE_OTHER): Payer: Medicare Other | Admitting: Adult Health

## 2011-11-22 VITALS — BP 126/78 | HR 70 | Temp 99.2°F | Ht 61.0 in | Wt 184.8 lb

## 2011-11-22 DIAGNOSIS — J441 Chronic obstructive pulmonary disease with (acute) exacerbation: Secondary | ICD-10-CM

## 2011-11-22 MED ORDER — DOXYCYCLINE HYCLATE 100 MG PO TABS: 100.0000 mg | ORAL_TABLET | Freq: Two times a day (BID) | ORAL | Status: DC

## 2011-11-22 NOTE — Progress Notes (Signed)
  Subjective:    Patient ID: Kara Lamb, female    DOB: 1955-11-15, 56 y.o.   MRN: [id number]  HPI 56 yo female with known hx of COPD   11/22/2011 Acute OV  pt states having increase of sob,wheezing,productive cough for 1 week.  OTC not helping  No chest pain or hemotpysis  No edema.  Low grade fevers.  Cough is worse for last 3 days .     Review of Systems Constitutional:   No  weight loss, night sweats,  Fevers, chills, + fatigue, or  lassitude.  HEENT:   No headaches,  Difficulty swallowing,  Tooth/dental problems, or  Sore throat,                No sneezing, itching, ear ache, nasal congestion, +post nasal drip,   CV:  No chest pain,  Orthopnea, PND, swelling in lower extremities, anasarca, dizziness, palpitations, syncope.   GI  No heartburn, indigestion, abdominal pain, nausea, vomiting, diarrhea, change in bowel habits, loss of appetite, bloody stools.   Resp:    No coughing up of blood.    No chest wall deformity  Skin: no rash or lesions.  GU: no dysuria, change in color of urine, no urgency or frequency.  No flank pain, no hematuria   MS:  No joint pain or swelling.  No decreased range of motion.  No back pain.  Psych:  No change in mood or affect. No depression or anxiety.  No memory loss.         Objective:   Physical Exam GEN: A/Ox3; pleasant , NAD, well nourished   HEENT:  /AT,  EACs-clear, TMs-wnl, NOSE-clear, THROAT-clear, no lesions, no postnasal drip or exudate noted.   NECK:  Supple w/ fair ROM; no JVD; normal carotid impulses w/o bruits; no thyromegaly or nodules palpated; no lymphadenopathy.  RESP  Coarse BS  w/o, wheezes/ rales/ or rhonchi.no accessory muscle use, no dullness to percussion  CARD:  RRR, no m/r/g  , no peripheral edema, pulses intact, no cyanosis or clubbing.  GI:   Soft & nt; nml bowel sounds; no organomegaly or masses detected.  Musco: Warm bil, no deformities or joint swelling noted.   Neuro: alert, no focal  deficits noted.    Skin: Warm, no lesions or rashes         Assessment & Plan:

## 2011-11-22 NOTE — Patient Instructions (Addendum)
Begin Symbicort 160/4.71mcg 2 puffs Twice daily  -brush/rinse /gargle after use.  Continue Spiriva daily  Doxycycline 100mg  Twice daily  For 7 days -take with food.  Please contact office for sooner follow up if symptoms do not improve or worsen or seek emergency care  follow up Dr. Delford Field  In 6 weeks and As needed

## 2011-11-23 ENCOUNTER — Telehealth: Payer: Self-pay | Admitting: Adult Health

## 2011-11-23 NOTE — Telephone Encounter (Signed)
I spoke with pt and she is requesting the OV note from her visit with TP on 11/22/11 to be mailed to her once it has been complete. Pt is requesting 2 copies of this. I confirmed pt address and she would like a call back when this is done. Will forward to JJ to look out for when note is done

## 2011-11-24 MED ORDER — BUDESONIDE-FORMOTEROL FUMARATE 160-4.5 MCG/ACT IN AERO: 2.0000 | INHALATION_SPRAY | Freq: Two times a day (BID) | RESPIRATORY_TRACT | Status: DC

## 2011-11-24 NOTE — Assessment & Plan Note (Signed)
Flare  Plan;  Begin Symbicort 160/4.68mcg 2 puffs Twice daily  -brush/rinse /gargle after use.  Continue Spiriva daily  Doxycycline 100mg  Twice daily  For 7 days -take with food.  Please contact office for sooner follow up if symptoms do not improve or worsen or seek emergency care  follow up Dr. Delford Field  In 6 weeks and As needed

## 2011-11-25 ENCOUNTER — Telehealth: Payer: Self-pay | Admitting: Critical Care Medicine

## 2011-11-25 NOTE — Telephone Encounter (Signed)
I spoke with pt and has adjusted medication list per pt had new medications added to her list

## 2011-11-25 NOTE — Telephone Encounter (Signed)
Note completed and mailed to pt. Carron Curie, CMA

## 2011-12-01 ENCOUNTER — Ambulatory Visit (INDEPENDENT_AMBULATORY_CARE_PROVIDER_SITE_OTHER): Payer: Medicare Other | Admitting: Critical Care Medicine

## 2011-12-01 ENCOUNTER — Other Ambulatory Visit: Payer: Medicare Other

## 2011-12-01 ENCOUNTER — Ambulatory Visit (INDEPENDENT_AMBULATORY_CARE_PROVIDER_SITE_OTHER)
Admission: RE | Admit: 2011-12-01 | Discharge: 2011-12-01 | Disposition: A | Discharge status: Home / Self Care | Payer: Medicare Other | Source: Ambulatory Visit | Attending: Critical Care Medicine | Admitting: Critical Care Medicine

## 2011-12-01 ENCOUNTER — Encounter: Payer: Self-pay | Admitting: Critical Care Medicine

## 2011-12-01 VITALS — BP 112/72 | HR 86 | Temp 98.4°F | Ht 61.0 in | Wt 181.6 lb

## 2011-12-01 DIAGNOSIS — J449 Chronic obstructive pulmonary disease, unspecified: Secondary | ICD-10-CM

## 2011-12-01 DIAGNOSIS — J4489 Other specified chronic obstructive pulmonary disease: Secondary | ICD-10-CM

## 2011-12-01 DIAGNOSIS — J309 Allergic rhinitis, unspecified: Secondary | ICD-10-CM

## 2011-12-01 NOTE — Assessment & Plan Note (Signed)
Gold stage C. COPD stable at this time Underlying allergic factors precipitated by animal dander Plan Maintain inhaled medications as prescribed Return 4 months

## 2011-12-01 NOTE — Progress Notes (Signed)
Subjective:    Patient ID: Kara Lamb, female    DOB: [birth year], 56 y.o.   MRN: [id number]  HPI  56 yo female with known hx of COPD   6/10 Acute OV  pt states having increase of sob,wheezing,productive cough for 1 week.  OTC not helping  No chest pain or hemotpysis  No edema.  Low grade fevers.  Cough is worse for last 3 days .   12/01/2011 Copd f/u.  In the patients apt , cat allergy an issue.   Pt has been on doxy.  At last ov the NP added symbicort to spiriva.  Plus doxy round again.   CAT 31.  Past Medical History  Diagnosis Date  . CAD (coronary artery disease)     s/p BMS to RCA and LCX 2008. In-stent restenosis of RCA stent rx'd with Xience DES 12/09. Requires extensive sedation for cath. cath 11/10: LAD mild plaque. LCX 10% RCA 50% ISR (FFR 0.91/0.92) Distal RCA 40/50. Cath 2/12: LAD 20-30; CFX 20-30, stent with 20-30; RCA stent with 30-40 and dRCA 30-40; EF 55-60%;  06/04/2011 - Nonobstructive Cath  . Chronic chest pain   . Anxiety and depression   . H/O: suicide attempt     drug overdose 3/08  . Hypertension   . Obesity   . Tobacco abuse   . H/O ETOH abuse   . Acid reflux   . Arthritis   . Hyperlipidemia   . Pneumonia   . UTI (lower urinary tract infection)   . Colitis   . GERD (gastroesophageal reflux disease)   . COPD (chronic obstructive pulmonary disease)      Family History  Problem Relation Age of Onset  . Coronary artery disease Mother   . Emphysema Mother   . Colon cancer Father   . Prostate cancer Father      History   Social History  . Marital Status: Divorced    Spouse Name: N/A    Number of Children: 3  . Years of Education: N/A   Occupational History  .      Workmen's comp   Social History Main Topics  . Smoking status: Former Smoker -- 1.0 packs/day for 35 years    Types: Cigarettes    Quit date: 02/23/2010  . Smokeless tobacco: Never Used  . Alcohol Use: No     rarely  . Drug Use: No  . Sexually Active: Not Currently    Other Topics Concern  . Not on file   Social History Narrative  . No narrative on file     Allergies  Allergen Reactions  . Prednisone     psychosis  . Imdur (Isosorbide)     Low blood pressure  . Nitroglycerin     Patches--headaches  . Zoloft (Sertraline Hcl)     Severe diarrhea     Outpatient Prescriptions Prior to Visit  Medication Sig Dispense Refill  . Ascorbic Acid (VITAMIN C) 100 MG tablet Take 100 mg by mouth daily.      Marland Kitchen aspirin EC 81 MG tablet Take 81 mg by mouth every morning.        Marland Kitchen atorvastatin (LIPITOR) 80 MG tablet Take 80 mg by mouth at bedtime. She takes every night at 9pm.      . budesonide-formoterol (SYMBICORT) 160-4.5 MCG/ACT inhaler Inhale 2 puffs into the lungs 2 (two) times daily.  1 Inhaler  12  . calcium carbonate 1250 MG capsule Take by mouth daily.      Marland Kitchen  Cholecalciferol (VITAMIN D3) 5000 UNITS TABS Take 1 tablet by mouth every morning.        . clonazePAM (KLONOPIN) 1 MG tablet Take 1 mg by mouth 3 (three) times daily as needed. For anxiety      . clopidogrel (PLAVIX) 75 MG tablet Take 1 tablet (75 mg total) by mouth every morning.  30 tablet  12  . dexlansoprazole (DEXILANT) 60 MG capsule Take 60 mg by mouth daily.      . fish oil-omega-3 fatty acids 1000 MG capsule Take 2 g by mouth 2 (two) times daily with a meal.       . FLUoxetine (PROZAC) 20 MG capsule Take 60 mg by mouth daily.      Marland Kitchen levothyroxine (SYNTHROID, LEVOTHROID) 25 MCG tablet Take 50 mcg by mouth daily. Take in the AM on empty stomach do not eat/drink for 1 hour      . loratadine (CLARITIN) 10 MG tablet Take 10 mg by mouth every morning.       . Multiple Vitamins-Minerals (MULTIVITAMIN WITH MINERALS) tablet Take 1 tablet by mouth every morning.       . nitroGLYCERIN (NITROSTAT) 0.4 MG SL tablet Place 1 tablet (0.4 mg total) under the tongue every 5 (five) minutes as needed. For chest pain  25 tablet  12  . olmesartan (BENICAR) 5 MG tablet Take 5 mg by mouth daily.      Marland Kitchen OVER  THE COUNTER MEDICATION Liquiod vitamin b complex with vitamin b12      . PROAIR HFA 108 (90 BASE) MCG/ACT inhaler INHALE TWO PUFFS EVERY 4 HOURS AS NEEDED FOR WHEEZING (SHORTNESS OF BREATH)  9 g  10  . Probiotic Product (PROBIOTIC PO) Take by mouth. daily      . tiotropium (SPIRIVA) 18 MCG inhalation capsule Place 18 mcg into inhaler and inhale every morning.       . topiramate (TOPAMAX) 100 MG tablet Take 100 mg by mouth. Takes 1 1/2 tablets tid      . traZODone (DESYREL) 150 MG tablet Take by mouth at bedtime. Takes 2 tablets at night        . doxycycline (VIBRA-TABS) 100 MG tablet Take 1 tablet (100 mg total) by mouth 2 (two) times daily.  14 tablet  0  . potassium chloride SA (K-DUR,KLOR-CON) 20 MEQ tablet Take 1 tablet (20 mEq total) by mouth 2 (two) times daily.  60 tablet  3     Review of Systems  Constitutional:   No  weight loss, night sweats,  Fevers, chills, + fatigue, or  lassitude.  HEENT:   No headaches,  Difficulty swallowing,  Tooth/dental problems, or  Sore throat,                No sneezing, itching, ear ache, nasal congestion, +post nasal drip,   CV:  No chest pain,  Orthopnea, PND, swelling in lower extremities, anasarca, dizziness, palpitations, syncope.   GI  No heartburn, indigestion, abdominal pain, nausea, vomiting, diarrhea, change in bowel habits, loss of appetite, bloody stools.   Resp:    No coughing up of blood.    No chest wall deformity  Skin: no rash or lesions.  GU: no dysuria, change in color of urine, no urgency or frequency.  No flank pain, no hematuria   MS:  No joint pain or swelling.  No decreased range of motion.  No back pain.  Psych:  No change in mood or affect. No depression or anxiety.  No  memory loss.         Objective:   Physical Exam BP 112/72  Pulse 86  Temp 98.4 F (36.9 C) (Oral)  Ht 5\' 1"  (1.549 m)  Wt 181 lb 9.6 oz (82.373 kg)  BMI 34.31 kg/m2  SpO2 97%  GEN: A/Ox3; pleasant , NAD, well nourished   HEENT:   [place]/AT,  EACs-clear, TMs-wnl, NOSE-clear, THROAT-clear, no lesions, no postnasal drip or exudate noted.   NECK:  Supple w/ fair ROM; no JVD; normal carotid impulses w/o bruits; no thyromegaly or nodules palpated; no lymphadenopathy.  RESP  Coarse BS  w/o, wheezes/ rales/ or rhonchi.no accessory muscle use, no dullness to percussion  CARD:  RRR, no m/r/g  , no peripheral edema, pulses intact, no cyanosis or clubbing.  GI:   Soft & nt; nml bowel sounds; no organomegaly or masses detected.  Musco: Warm bil, no deformities or joint swelling noted.   Neuro: alert, no focal deficits noted.    Skin: Warm, no lesions or rashes         Assessment & Plan:   COPD Gold stage C. COPD stable at this time Underlying allergic factors precipitated by animal dander Plan Maintain inhaled medications as prescribed Return 4 months   Updated Medication List Outpatient Encounter Prescriptions as of 12/01/2011  Medication Sig Dispense Refill  . Ascorbic Acid (VITAMIN C) 100 MG tablet Take 100 mg by mouth daily.      Marland Kitchen aspirin EC 81 MG tablet Take 81 mg by mouth every morning.        Marland Kitchen atorvastatin (LIPITOR) 80 MG tablet Take 80 mg by mouth at bedtime. She takes every night at 9pm.      . budesonide-formoterol (SYMBICORT) 160-4.5 MCG/ACT inhaler Inhale 2 puffs into the lungs 2 (two) times daily.  1 Inhaler  12  . calcium carbonate 1250 MG capsule Take by mouth daily.      . Cholecalciferol (VITAMIN D3) 5000 UNITS TABS Take 1 tablet by mouth every morning.        . clonazePAM (KLONOPIN) 1 MG tablet Take 1 mg by mouth 3 (three) times daily as needed. For anxiety      . clopidogrel (PLAVIX) 75 MG tablet Take 1 tablet (75 mg total) by mouth every morning.  30 tablet  12  . dexlansoprazole (DEXILANT) 60 MG capsule Take 60 mg by mouth daily.      . fish oil-omega-3 fatty acids 1000 MG capsule Take 2 g by mouth 2 (two) times daily with a meal.       . FLUoxetine (PROZAC) 20 MG capsule Take 60 mg by mouth  daily.      Marland Kitchen levothyroxine (SYNTHROID, LEVOTHROID) 25 MCG tablet Take 50 mcg by mouth daily. Take in the AM on empty stomach do not eat/drink for 1 hour      . loratadine (CLARITIN) 10 MG tablet Take 10 mg by mouth every morning.       . Multiple Vitamins-Minerals (MULTIVITAMIN WITH MINERALS) tablet Take 1 tablet by mouth every morning.       . nitroGLYCERIN (NITROSTAT) 0.4 MG SL tablet Place 1 tablet (0.4 mg total) under the tongue every 5 (five) minutes as needed. For chest pain  25 tablet  12  . olmesartan (BENICAR) 5 MG tablet Take 5 mg by mouth daily.      Marland Kitchen OVER THE COUNTER MEDICATION Liquiod vitamin b complex with vitamin b12      . PROAIR HFA 108 (90 BASE) MCG/ACT inhaler  INHALE TWO PUFFS EVERY 4 HOURS AS NEEDED FOR WHEEZING (SHORTNESS OF BREATH)  9 g  10  . Probiotic Product (PROBIOTIC PO) Take by mouth. daily      . tiotropium (SPIRIVA) 18 MCG inhalation capsule Place 18 mcg into inhaler and inhale every morning.       . topiramate (TOPAMAX) 100 MG tablet Take 100 mg by mouth. Takes 1 1/2 tablets tid      . traZODone (DESYREL) 150 MG tablet Take by mouth at bedtime. Takes 2 tablets at night        . DISCONTD: doxycycline (VIBRA-TABS) 100 MG tablet Take 1 tablet (100 mg total) by mouth 2 (two) times daily.  14 tablet  0  . DISCONTD: potassium chloride SA (K-DUR,KLOR-CON) 20 MEQ tablet Take 1 tablet (20 mEq total) by mouth 2 (two) times daily.  60 tablet  3

## 2011-12-01 NOTE — Patient Instructions (Signed)
Labs today for allergy testing, based upon this will reissue a new letter Stay on symbicort and spiriva daily Return 2 months

## 2011-12-01 NOTE — Progress Notes (Signed)
Quick Note:  Called, spoke with pt. I informed her of below cxr results and recs per Dr. Delford Field. She verbalized understanding of these results and recs. ______

## 2011-12-01 NOTE — Progress Notes (Signed)
Quick Note:  Notify the patient that the Xray is stable and no pneumonia No change in medications are recommended. Continue current meds as prescribed at last office visit ______ 

## 2011-12-03 ENCOUNTER — Telehealth: Payer: Self-pay | Admitting: Critical Care Medicine

## 2011-12-03 LAB — ALLERGY FULL PROFILE
Allergen, D pternoyssinus,d7: <0.1 kU/L (ref <0.35)
Allergen,Goose feathers, e70: <0.1 kU/L (ref <0.35)
Alternaria Alternata: 0.35 kU/L — ABNORMAL HIGH (ref <0.35)
Bahia Grass: <0.1 kU/L (ref <0.35)
Cat Dander: 4.66 kU/L — ABNORMAL HIGH (ref <0.35)
Common Ragweed: 0.31 kU/L (ref <0.35)
Dog Dander: 1.66 kU/L — ABNORMAL HIGH (ref <0.35)
Fescue: 1.38 kU/L — ABNORMAL HIGH (ref <0.35)
Goldenrod: <0.1 kU/L (ref <0.35)
Helminthosporium halodes: <0.1 kU/L (ref <0.35)
House Dust Hollister: 1.7 kU/L — ABNORMAL HIGH (ref <0.35)
Lamb's Quarters: <0.1 kU/L (ref <0.35)
Sycamore Tree: <0.1 kU/L (ref <0.35)

## 2011-12-03 NOTE — Telephone Encounter (Signed)
I spoke with pt and she is requesting her lab results 12/01/11. Pt is requesting she get these results today. I advised pt PW was out of the office but she is requesting this be read today. Also she states that PW had advised her if her allergy profile came back and she was allergic to animal dander then he would do a more detailed letter for her to get her out of her current lease/living situation. Please advise TP if you would please give pt results and do a letter for pt since PW is out of the office until July 1. Thanks

## 2011-12-03 NOTE — Telephone Encounter (Signed)
noted 

## 2011-12-03 NOTE — Telephone Encounter (Signed)
Pt aware Will forward to PW

## 2011-12-03 NOTE — Telephone Encounter (Signed)
Rast was positive for cat and dog dander  W/ high IGE  Wait for further instructions from Dr. Delford Field

## 2011-12-06 NOTE — Telephone Encounter (Signed)
Pt called back about conversation with PW Friday.  Wants to speak to nurse  Call back @ [phone] Leanora Ivanoff

## 2011-12-06 NOTE — Telephone Encounter (Signed)
This is fine with me.  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

## 2011-12-06 NOTE — Telephone Encounter (Signed)
Pt requests to have the letter addressed to [facility] & [employer] & mailed to [address], [place], [place]  [address].  Kara Lamb

## 2011-12-06 NOTE — Telephone Encounter (Signed)
I spoke with pt and she states she needs her letter addressed to [employer] and [employer]. Pt is moving out on 12/09/11. Pt is requesting a copy be mailed to her as well once this is done. Please advise Dr. Delford Field, thanks

## 2011-12-07 ENCOUNTER — Ambulatory Visit: Payer: Self-pay | Admitting: Cardiovascular Disease

## 2011-12-09 ENCOUNTER — Encounter: Payer: Self-pay | Admitting: *Deleted

## 2011-12-09 NOTE — Telephone Encounter (Signed)
Letter completed and placed in mail to [place] and [employer]idge [employer]ver at the address provided below.  lmomtcb to inform pt of this.

## 2011-12-10 NOTE — Telephone Encounter (Signed)
Called, spoke with pt.  She is aware letter has been mailed to [place] and [employer]idge A[employer] and I have placed a letter in the mail to her home address on file (which I did verify).  She verbalized understanding of this and voiced no further questions/concerns at this time.

## 2011-12-10 NOTE — Telephone Encounter (Signed)
PT RETURNED CALL. SHE ALSO SAYS SHE WANTS A COPY OF THIS LETTER MAILED TO HER HOME ADDRESS (THE ONE WE HAVE ON FILE IS CORRECT). Kara Lamb

## 2011-12-10 NOTE — Telephone Encounter (Signed)
lmomtcb x2 for pt 

## 2012-01-11 ENCOUNTER — Ambulatory Visit: Payer: Self-pay | Admitting: Critical Care Medicine

## 2012-02-02 ENCOUNTER — Telehealth: Payer: Self-pay | Admitting: Critical Care Medicine

## 2012-02-02 NOTE — Telephone Encounter (Signed)
LMOM of last dates of flu and pneumonia shots per pt's request.

## 2012-02-17 ENCOUNTER — Other Ambulatory Visit: Payer: Self-pay | Admitting: Critical Care Medicine

## 2012-09-05 ENCOUNTER — Encounter: Payer: Self-pay | Admitting: Internal Medicine

## 2012-09-07 IMAGING — CR DG CHEST 2V
2 series · 2 of 2 positions shown · non-contrast
Comparison: 03/04/2011

CLINICAL DATA: Chest pain

CHEST - 2 VIEW

[w chest pa]
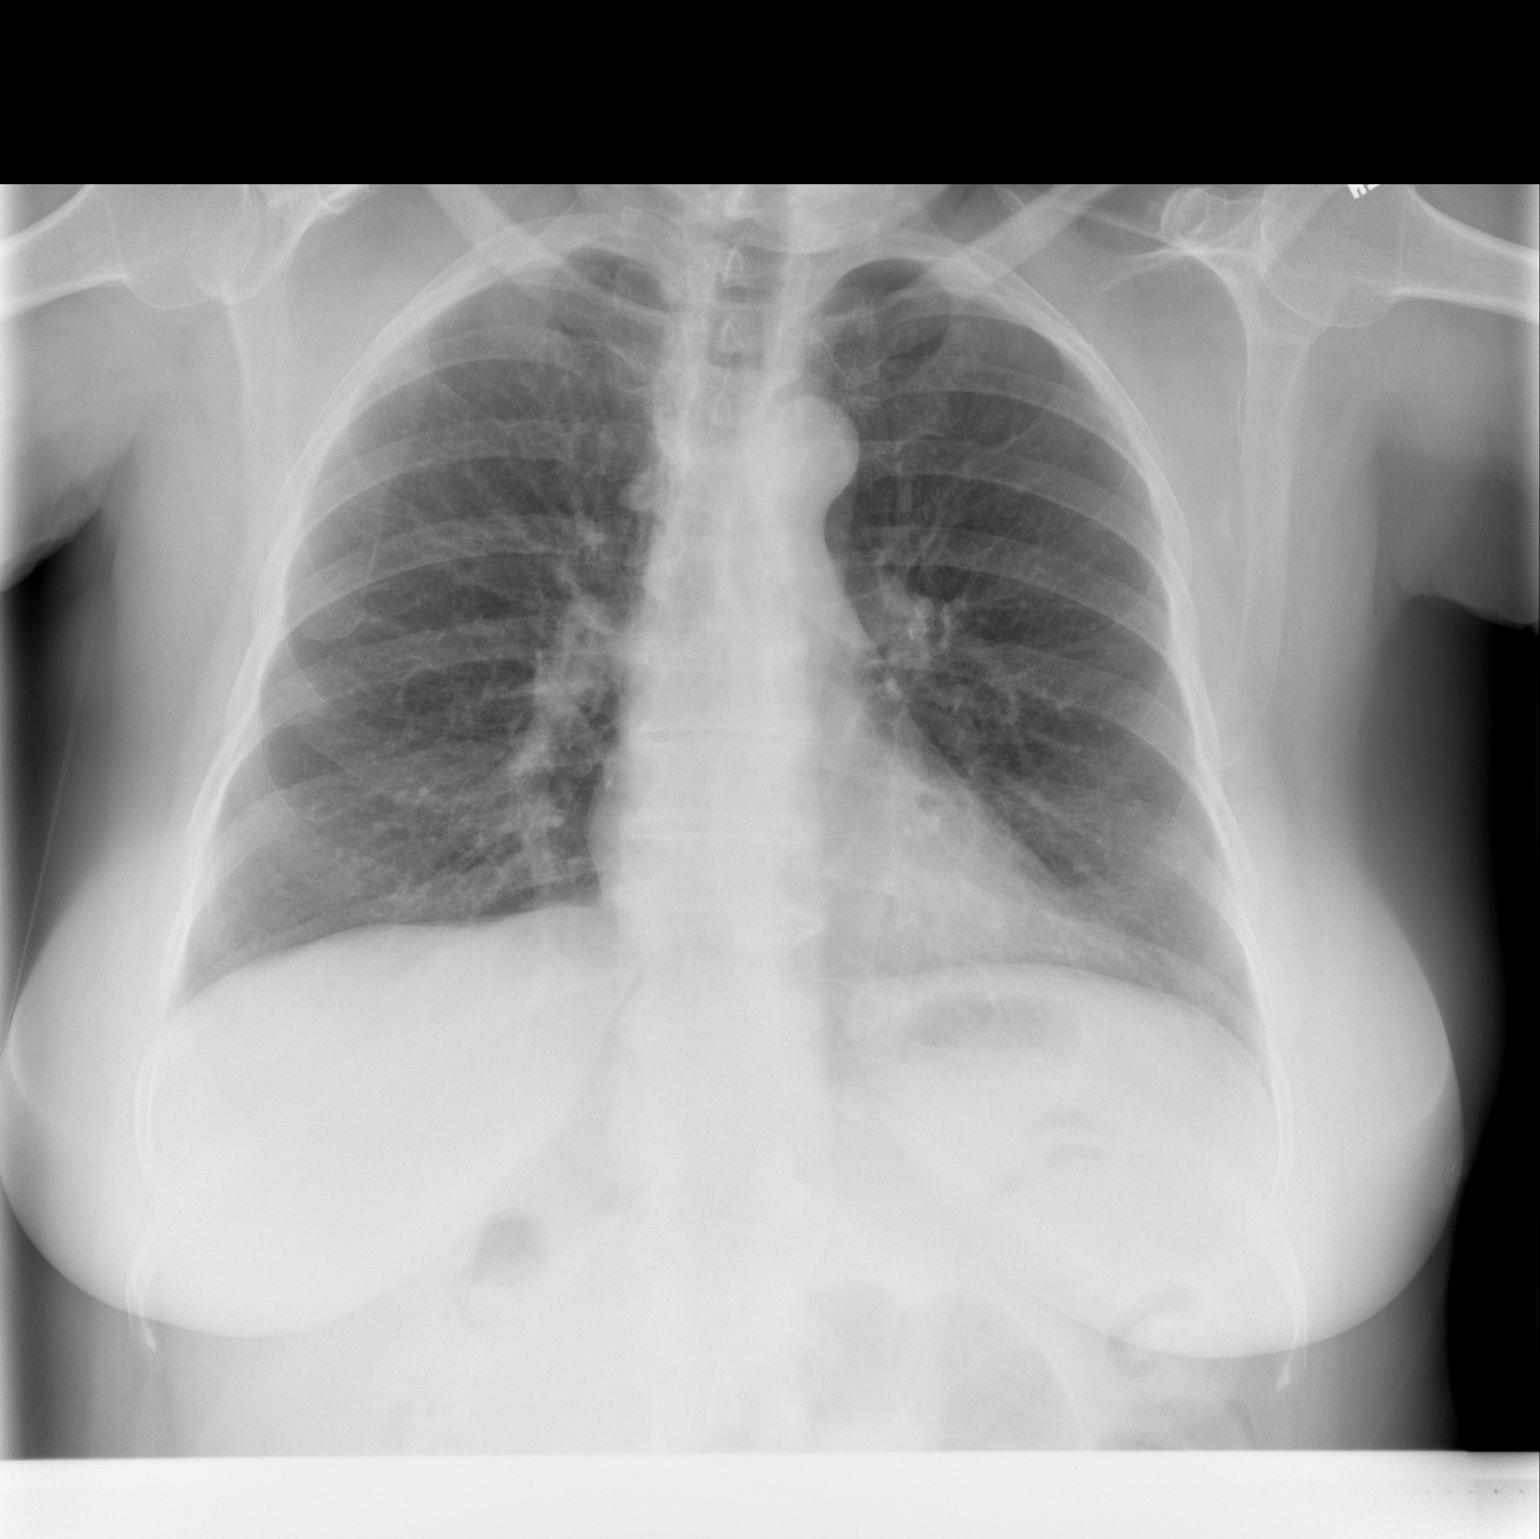

[w chest lat]
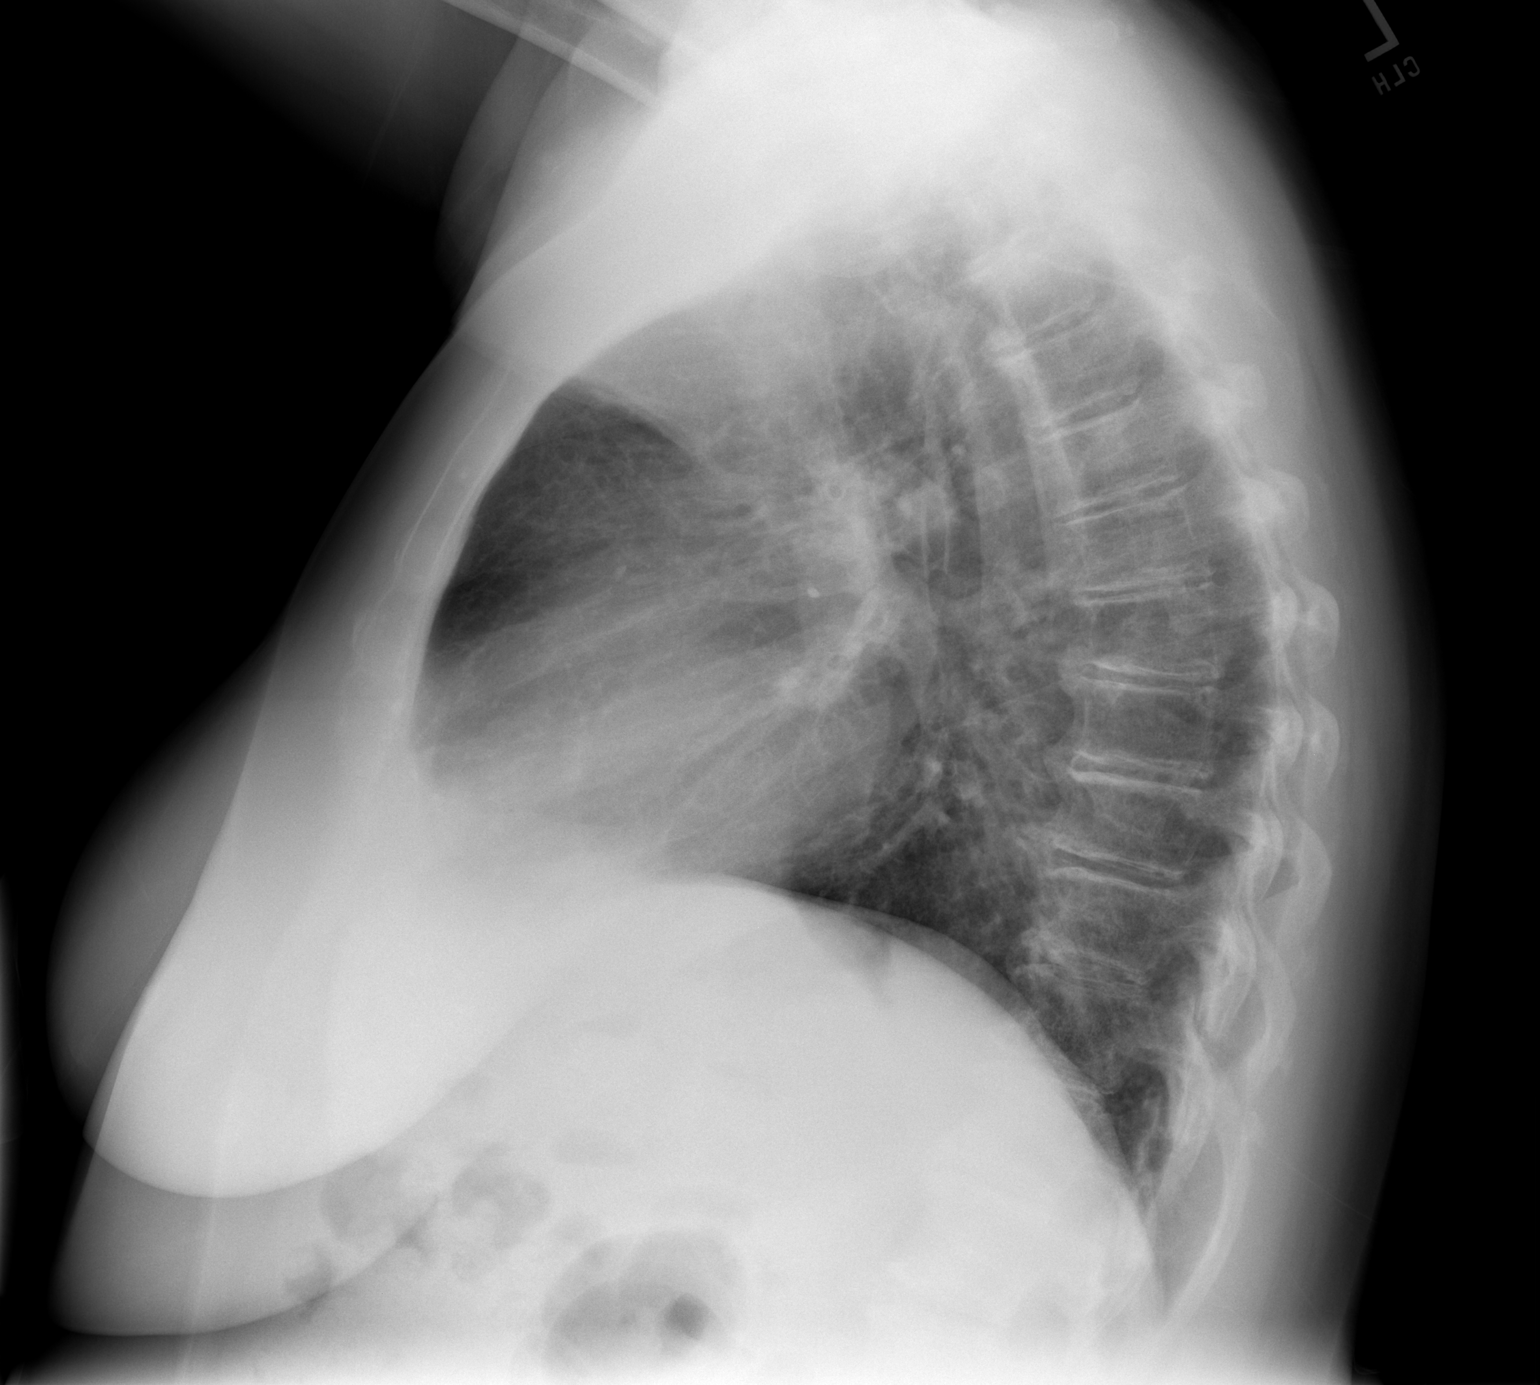

[2 of 2 positions shown; findings below may reference images not displayed]

FINDINGS: Mild linear left lung base opacity may represent minimal
scarring or atelectasis.  Otherwise clear lungs.  No pleural
effusion or pneumothorax.  Stable cardiomediastinal contours,
within normal limits.  No acute osseous abnormality with multilevel
degenerative changes.
IMPRESSION: No acute process identified.

## 2012-09-25 IMAGING — CR DG CHEST 2V
2 series · 2 of 2 positions shown · non-contrast
Comparison: Chest x-ray 07/03/2010

CLINICAL DATA: Persistent cough and shortness of breath.

CHEST - 2 VIEW

[w chest pa]
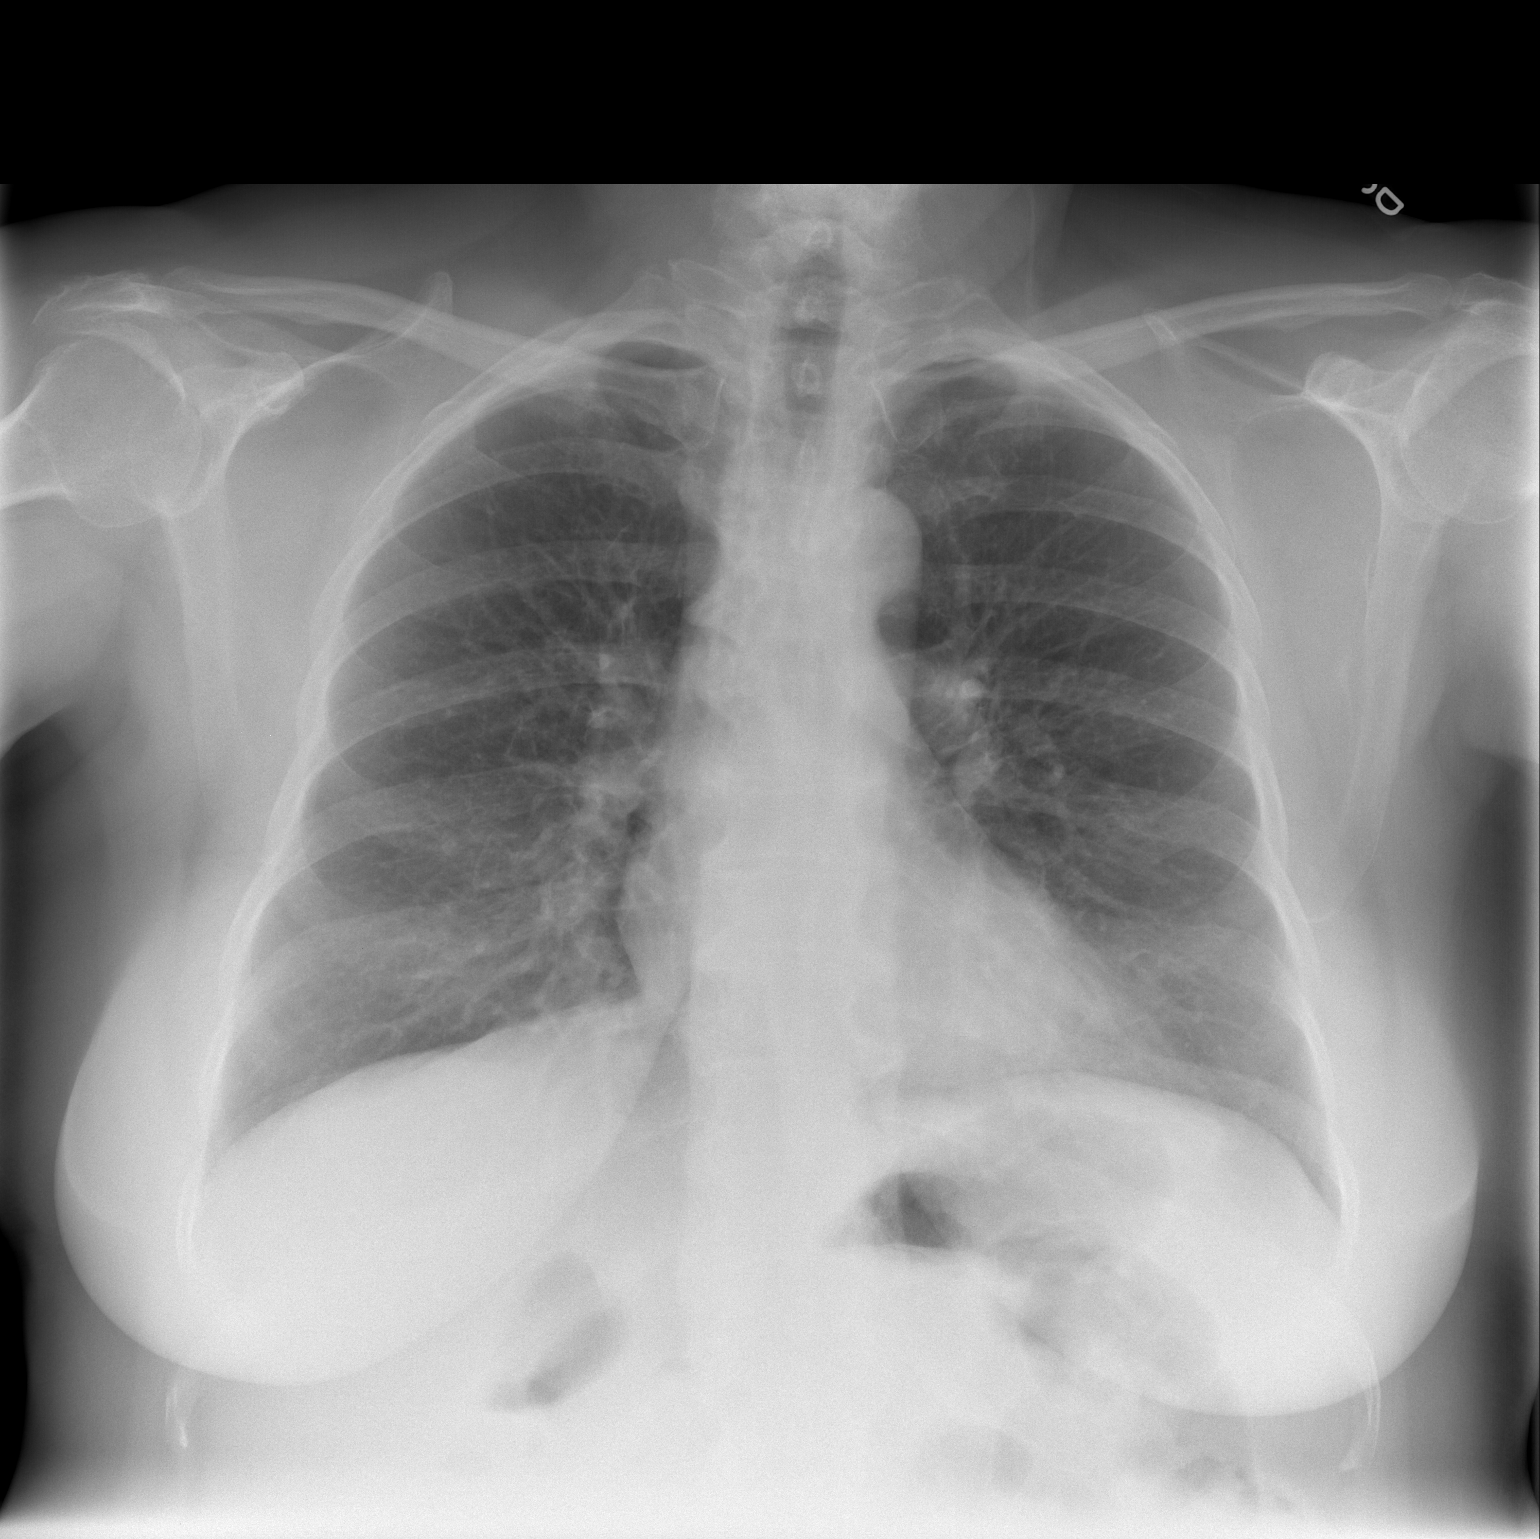

[w chest lat]
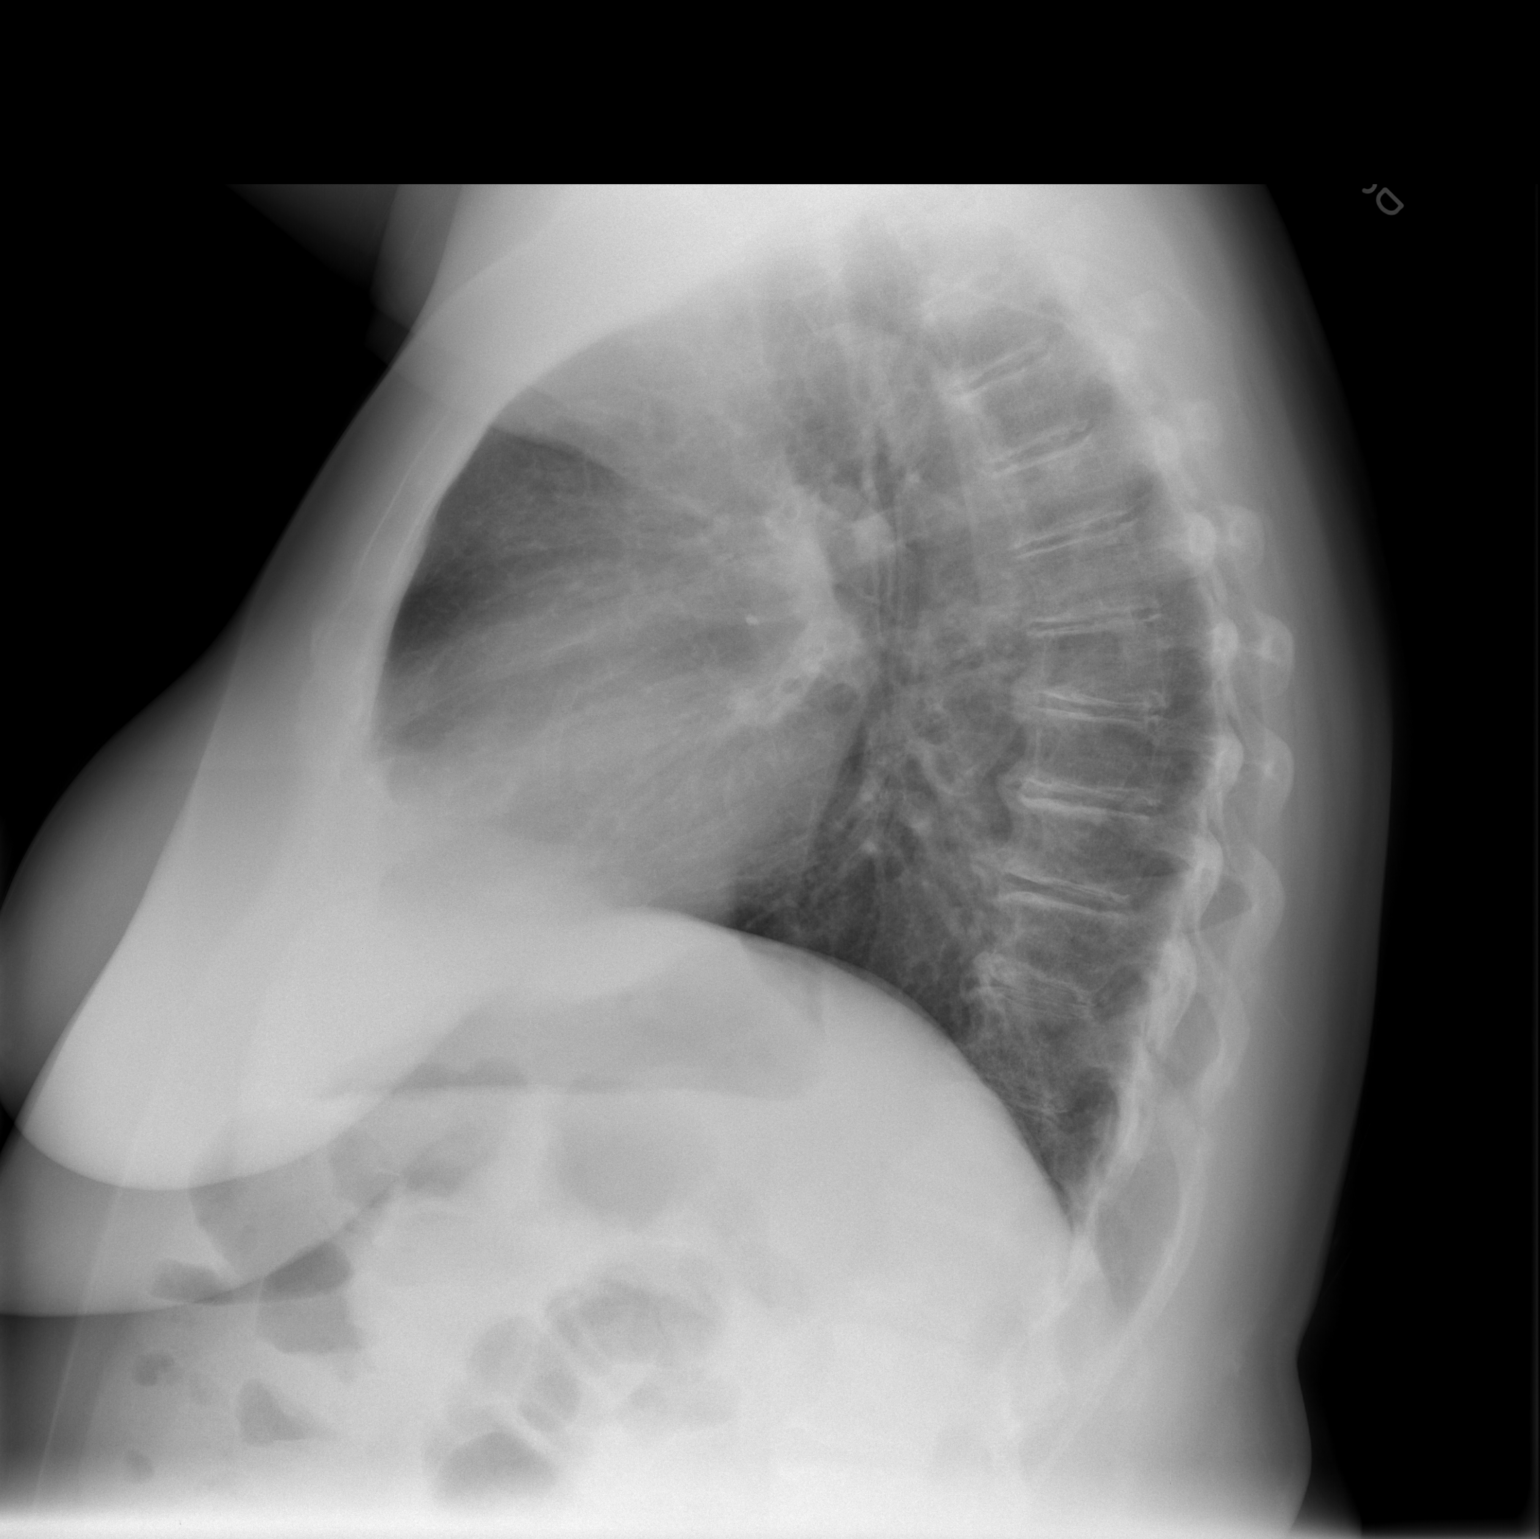

[2 of 2 positions shown; findings below may reference images not displayed]

FINDINGS: Lung volumes are normal.  No consolidative airspace
disease.  No pleural effusions.  Pulmonary vasculature and the
cardiomediastinal silhouette are within normal limits.
IMPRESSION: No radiographic evidence of acute cardiopulmonary disease.

## 2013-02-16 ENCOUNTER — Ambulatory Visit: Payer: Self-pay | Admitting: Cardiology

## 2013-02-21 ENCOUNTER — Encounter: Payer: Self-pay | Admitting: Physician Assistant

## 2013-02-21 ENCOUNTER — Encounter (HOSPITAL_COMMUNITY): Payer: Self-pay | Admitting: General Practice

## 2013-02-21 ENCOUNTER — Ambulatory Visit (INDEPENDENT_AMBULATORY_CARE_PROVIDER_SITE_OTHER): Payer: BC Managed Care – PPO | Admitting: Physician Assistant

## 2013-02-21 ENCOUNTER — Observation Stay (HOSPITAL_COMMUNITY): Payer: BC Managed Care – PPO

## 2013-02-21 ENCOUNTER — Observation Stay (HOSPITAL_COMMUNITY)
Admission: AD | Admit: 2013-02-21 | Discharge: 2013-02-22 | Disposition: A | Discharge status: Home / Self Care | Payer: BC Managed Care – PPO | Source: Ambulatory Visit | Attending: Internal Medicine | Admitting: Internal Medicine

## 2013-02-21 VITALS — BP 109/76 | HR 72 | Ht 61.0 in | Wt 197.0 lb

## 2013-02-21 DIAGNOSIS — J4489 Other specified chronic obstructive pulmonary disease: Secondary | ICD-10-CM | POA: Insufficient documentation

## 2013-02-21 DIAGNOSIS — E782 Mixed hyperlipidemia: Secondary | ICD-10-CM | POA: Diagnosis not present

## 2013-02-21 DIAGNOSIS — I10 Essential (primary) hypertension: Secondary | ICD-10-CM | POA: Diagnosis not present

## 2013-02-21 DIAGNOSIS — Y831 Surgical operation with implant of artificial internal device as the cause of abnormal reaction of the patient, or of later complication, without mention of misadventure at the time of the procedure: Secondary | ICD-10-CM | POA: Insufficient documentation

## 2013-02-21 DIAGNOSIS — J439 Emphysema, unspecified: Secondary | ICD-10-CM | POA: Diagnosis present

## 2013-02-21 DIAGNOSIS — J449 Chronic obstructive pulmonary disease, unspecified: Secondary | ICD-10-CM | POA: Insufficient documentation

## 2013-02-21 DIAGNOSIS — R079 Chest pain, unspecified: Secondary | ICD-10-CM | POA: Diagnosis present

## 2013-02-21 DIAGNOSIS — E039 Hypothyroidism, unspecified: Secondary | ICD-10-CM | POA: Insufficient documentation

## 2013-02-21 DIAGNOSIS — F319 Bipolar disorder, unspecified: Secondary | ICD-10-CM | POA: Diagnosis present

## 2013-02-21 DIAGNOSIS — I2510 Atherosclerotic heart disease of native coronary artery without angina pectoris: Secondary | ICD-10-CM

## 2013-02-21 DIAGNOSIS — Z7902 Long term (current) use of antithrombotics/antiplatelets: Secondary | ICD-10-CM | POA: Insufficient documentation

## 2013-02-21 DIAGNOSIS — E785 Hyperlipidemia, unspecified: Secondary | ICD-10-CM | POA: Diagnosis present

## 2013-02-21 DIAGNOSIS — I209 Angina pectoris, unspecified: Secondary | ICD-10-CM | POA: Insufficient documentation

## 2013-02-21 DIAGNOSIS — K219 Gastro-esophageal reflux disease without esophagitis: Secondary | ICD-10-CM | POA: Diagnosis not present

## 2013-02-21 DIAGNOSIS — Z9861 Coronary angioplasty status: Secondary | ICD-10-CM | POA: Insufficient documentation

## 2013-02-21 DIAGNOSIS — I200 Unstable angina: Secondary | ICD-10-CM

## 2013-02-21 HISTORY — DX: Other complications of anesthesia, initial encounter: T88.59XA

## 2013-02-21 HISTORY — DX: Adverse effect of unspecified anesthetic, initial encounter: T41.45XA

## 2013-02-21 LAB — COMPREHENSIVE METABOLIC PANEL
BUN: 10 mg/dL (ref 6–23)
CO2: 26 mEq/L (ref 19–32)
Calcium: 9.2 mg/dL (ref 8.4–10.5)
Creatinine, Ser: 0.58 mg/dL (ref 0.50–1.10)
GFR calc Af Amer: >90 mL/min (ref >90)
GFR calc non Af Amer: >90 mL/min (ref >90)
Glucose, Bld: 98 mg/dL (ref 70–99)
Total Bilirubin: 0.3 mg/dL (ref 0.3–1.2)

## 2013-02-21 LAB — CBC WITH DIFFERENTIAL/PLATELET
Eosinophils Absolute: 0.2 10*3/uL (ref 0.0–0.7)
Eosinophils Relative: 3 % (ref 0–5)
HCT: 37.4 % (ref 36.0–46.0)
Lymphs Abs: 2.4 10*3/uL (ref 0.7–4.0)
MCH: 34.6 pg — ABNORMAL HIGH (ref 26.0–34.0)
MCV: 100.3 fL — ABNORMAL HIGH (ref 78.0–100.0)
Monocytes Absolute: 0.4 10*3/uL (ref 0.1–1.0)
Monocytes Relative: 7 % (ref 3–12)
Platelets: 212 10*3/uL (ref 150–400)
RBC: 3.73 MIL/uL — ABNORMAL LOW (ref 3.87–5.11)

## 2013-02-21 LAB — PROTIME-INR: Prothrombin Time: 13 seconds (ref 11.6–15.2)

## 2013-02-21 LAB — HEPARIN LEVEL (UNFRACTIONATED): Heparin Unfractionated: 0.13 IU/mL — ABNORMAL LOW (ref 0.30–0.70)

## 2013-02-21 LAB — TROPONIN I: Troponin I: <0.3 ng/mL (ref <0.30)

## 2013-02-21 LAB — PRO B NATRIURETIC PEPTIDE: Pro B Natriuretic peptide (BNP): 15.1 pg/mL (ref 0–125)

## 2013-02-21 MED ORDER — SODIUM CHLORIDE 0.9 % IJ SOLN: 3.0000 mL | Freq: Two times a day (BID) | INTRAMUSCULAR | Status: DC

## 2013-02-21 MED ORDER — HEPARIN (PORCINE) IN NACL 100-0.45 UNIT/ML-% IJ SOLN
1200.0000 [IU]/h | INTRAMUSCULAR | Status: DC
  Administered 2013-02-21: 850 [IU]/h via INTRAVENOUS
  Filled 2013-02-21 (×2): qty 250

## 2013-02-21 MED ORDER — TIOTROPIUM BROMIDE MONOHYDRATE 18 MCG IN CAPS
18.0000 ug | ORAL_CAPSULE | Freq: Every day | RESPIRATORY_TRACT | Status: DC
  Filled 2013-02-21: qty 5

## 2013-02-21 MED ORDER — HEPARIN BOLUS VIA INFUSION
2000.0000 [IU] | Freq: Once | INTRAVENOUS | Status: AC
  Administered 2013-02-21: 2000 [IU] via INTRAVENOUS
  Filled 2013-02-21: qty 2000

## 2013-02-21 MED ORDER — LORATADINE 10 MG PO TABS
10.0000 mg | ORAL_TABLET | Freq: Every day | ORAL | Status: DC
  Filled 2013-02-21: qty 1

## 2013-02-21 MED ORDER — LOSARTAN POTASSIUM 50 MG PO TABS
50.0000 mg | ORAL_TABLET | Freq: Every day | ORAL | Status: DC
  Filled 2013-02-21: qty 1

## 2013-02-21 MED ORDER — SODIUM CHLORIDE 0.9 % IV SOLN
250.0000 mL | INTRAVENOUS | Status: DC | PRN
  Administered 2013-02-21: 250 mL via INTRAVENOUS

## 2013-02-21 MED ORDER — CLOPIDOGREL BISULFATE 75 MG PO TABS: 75.0000 mg | ORAL_TABLET | Freq: Every day | ORAL | Status: DC

## 2013-02-21 MED ORDER — MORPHINE SULFATE 2 MG/ML IJ SOLN
2.0000 mg | INTRAMUSCULAR | Status: DC | PRN
  Administered 2013-02-21: 2 mg via INTRAVENOUS
  Filled 2013-02-21: qty 1

## 2013-02-21 MED ORDER — ALBUTEROL SULFATE (5 MG/ML) 0.5% IN NEBU: 2.5000 mg | INHALATION_SOLUTION | Freq: Four times a day (QID) | RESPIRATORY_TRACT | Status: DC | PRN

## 2013-02-21 MED ORDER — TRAMADOL HCL 50 MG PO TABS
50.0000 mg | ORAL_TABLET | Freq: Four times a day (QID) | ORAL | Status: DC | PRN
  Administered 2013-02-21: 50 mg via ORAL
  Filled 2013-02-21: qty 1

## 2013-02-21 MED ORDER — SODIUM CHLORIDE 0.9 % IJ SOLN: 3.0000 mL | INTRAMUSCULAR | Status: DC | PRN

## 2013-02-21 MED ORDER — ASPIRIN EC 81 MG PO TBEC: 81.0000 mg | DELAYED_RELEASE_TABLET | Freq: Every day | ORAL | Status: DC

## 2013-02-21 MED ORDER — NITROGLYCERIN 0.4 MG SL SUBL: 0.4000 mg | SUBLINGUAL_TABLET | SUBLINGUAL | Status: DC | PRN

## 2013-02-21 MED ORDER — BUDESONIDE-FORMOTEROL FUMARATE 160-4.5 MCG/ACT IN AERO
2.0000 | INHALATION_SPRAY | Freq: Two times a day (BID) | RESPIRATORY_TRACT | Status: DC
  Administered 2013-02-21: 2 via RESPIRATORY_TRACT
  Filled 2013-02-21: qty 6

## 2013-02-21 MED ORDER — MORPHINE SULFATE 4 MG/ML IJ SOLN
4.0000 mg | INTRAMUSCULAR | Status: DC | PRN
  Administered 2013-02-21 – 2013-02-22 (×3): 4 mg via INTRAVENOUS
  Filled 2013-02-21 (×3): qty 1

## 2013-02-21 MED ORDER — DULOXETINE HCL 60 MG PO CPEP
60.0000 mg | ORAL_CAPSULE | Freq: Every day | ORAL | Status: DC
  Filled 2013-02-21: qty 1

## 2013-02-21 MED ORDER — HEPARIN BOLUS VIA INFUSION
3000.0000 [IU] | Freq: Once | INTRAVENOUS | Status: AC
  Administered 2013-02-21: 3000 [IU] via INTRAVENOUS
  Filled 2013-02-21: qty 3000

## 2013-02-21 MED ORDER — TRAZODONE HCL 150 MG PO TABS
150.0000 mg | ORAL_TABLET | Freq: Every day | ORAL | Status: DC
  Administered 2013-02-21: 150 mg via ORAL
  Filled 2013-02-21 (×2): qty 1

## 2013-02-21 MED ORDER — NITROGLYCERIN IN D5W 200-5 MCG/ML-% IV SOLN
5.0000 ug/min | INTRAVENOUS | Status: DC
  Administered 2013-02-21: 5 ug/min via INTRAVENOUS
  Filled 2013-02-21: qty 250

## 2013-02-21 MED ORDER — ASPIRIN 81 MG PO CHEW
324.0000 mg | CHEWABLE_TABLET | ORAL | Status: AC
  Administered 2013-02-22: 324 mg via ORAL
  Filled 2013-02-21: qty 4

## 2013-02-21 MED ORDER — ONDANSETRON HCL 4 MG/2ML IJ SOLN: 4.0000 mg | Freq: Four times a day (QID) | INTRAMUSCULAR | Status: DC | PRN

## 2013-02-21 MED ORDER — SODIUM CHLORIDE 0.9 % IV SOLN
INTRAVENOUS | Status: DC
  Administered 2013-02-22: 04:00:00 via INTRAVENOUS

## 2013-02-21 MED ORDER — PANTOPRAZOLE SODIUM 40 MG PO TBEC
80.0000 mg | DELAYED_RELEASE_TABLET | ORAL | Status: DC
  Administered 2013-02-22: 80 mg via ORAL
  Filled 2013-02-21: qty 2

## 2013-02-21 MED ORDER — ALBUTEROL SULFATE HFA 108 (90 BASE) MCG/ACT IN AERS: 2.0000 | INHALATION_SPRAY | Freq: Four times a day (QID) | RESPIRATORY_TRACT | Status: DC | PRN

## 2013-02-21 MED ORDER — ATORVASTATIN CALCIUM 40 MG PO TABS
40.0000 mg | ORAL_TABLET | Freq: Every day | ORAL | Status: DC
  Administered 2013-02-21: 40 mg via ORAL
  Filled 2013-02-21 (×2): qty 1

## 2013-02-21 MED ORDER — SODIUM CHLORIDE 0.9 % IV SOLN: 250.0000 mL | INTRAVENOUS | Status: DC | PRN

## 2013-02-21 MED ORDER — ACETAMINOPHEN 325 MG PO TABS: 650.0000 mg | ORAL_TABLET | ORAL | Status: DC | PRN

## 2013-02-21 MED ORDER — RANOLAZINE ER 500 MG PO TB12
500.0000 mg | ORAL_TABLET | Freq: Two times a day (BID) | ORAL | Status: DC
  Administered 2013-02-21: 500 mg via ORAL
  Filled 2013-02-21 (×3): qty 1

## 2013-02-21 MED ORDER — LEVOTHYROXINE SODIUM 50 MCG PO TABS
50.0000 ug | ORAL_TABLET | Freq: Every day | ORAL | Status: DC
  Administered 2013-02-22: 50 ug via ORAL
  Filled 2013-02-21 (×2): qty 1

## 2013-02-21 MED ORDER — INFLUENZA VAC SPLIT QUAD 0.5 ML IM SUSP
0.5000 mL | INTRAMUSCULAR | Status: DC
  Filled 2013-02-21: qty 0.5

## 2013-02-21 NOTE — Progress Notes (Signed)
ANTICOAGULATION CONSULT NOTE - Follow Up Consult  Pharmacy Consult for Heparin Indication: chest pain/ACS  Allergies  Allergen Reactions  . Prednisone     psychosis  . Imdur [Isosorbide]     Low blood pressure  . Nitroglycerin     Patches--headaches  . Zoloft [Sertraline Hcl]     Severe diarrhea    Patient Measurements: Height: 5\' 1"  (154.9 cm) Weight: 197 lb (89.359 kg) IBW/kg (Calculated) : 47.8 Heparin Dosing Weight: 68.6 kg  Vital Signs: Temp: 98.1 F (36.7 C) (09/10 2119) Temp src: Oral (09/10 2119) BP: 105/58 mmHg (09/10 2119) Pulse Rate: 80 (09/10 2119)  Labs:  Recent Labs  02/21/13 1349 02/21/13 1914 02/21/13 2056  HGB 12.9  --   --   HCT 37.4  --   --   PLT 212  --   --   APTT 25  --   --   LABPROT 13.0  --   --   INR 1.00  --   --   HEPARINUNFRC  --   --  0.13*  CREATININE 0.58  --   --   TROPONINI <0.30 <0.30  --     Estimated Creatinine Clearance: 79.8 ml/min (by C-G formula based on Cr of 0.58).   Medications:  Heparin @ 850 units/hr (8.5 ml/hr)  Assessment: 57 y.o. F who continues on heparin for persistent CP and [place] while awaiting cardiac cath for further evaluation on 02/22/13. Heparin level this evening is SUBtherapeutic (HL 0.13, goal of 0.3-0.7). No repeat CBC since heparin initiated -- no overt s/sx of bleeding noted at this time.   Goal of Therapy:  Heparin level 0.3-0.7 units/ml Monitor platelets by anticoagulation protocol: Yes   Plan:  1. Heparin bolus of 2000 units x 1 2. Increase heparin drip rate to 1050 units/hr (10.5 ml/hr) 3. Will continue to monitor for any signs/symptoms of bleeding and will follow up with heparin level in 6 hours   Georgina Pillion, PharmD, BCPS Clinical Pharmacist Pager: [phone] 02/21/2013 9:43 PM

## 2013-02-21 NOTE — H&P (Signed)
History and Physical  Date:  02/21/2013   ID:  Kara Lamb, [place] [birth year], MRN [id number]  PCP:  No primary provider on file.  Cardiologist:  Dr. Arvilla Meres => Dr. Verne Carrow => Dr. Rollene Rotunda     History of Present Illness: Kara Lamb is a 57 y.o. female who returns for evaluation of CP.  She has a hx of CAD, COPD and chronic chest pain. She's had previous stenting of her RCA and CFX c/b ISR of her RCA back in 2009. At that time, she underwent DES to her RCA. She also has a h/o HTN, HL, obesity, reflex sympathetic dystrophy and suicidal depression. Myoview in 12/2009:  EF 74% with no ischemia or infarct. Myoview 9/12:  No scar or ischemia, EF 70%.  LHC 05/2011:  LAD 20 throughout, pD1 50, mCFX 40, pRCA stent ok with minimal restenosis, mRCA stent 60% ISR, dRCA 20, EF 55% (FFR of mRCA 0.98 - no flow limiting).  Med Rx continued.  She has had Barrett's esophagus demonstrated on EGD in the past.  Last seen by Dr. Verne Carrow in 06/2011.  She saw her PCP yesterday with c/os CP and was referred for follow up today.    She moved to [place] in 2013.  She was followed by a cardiologist there (Dr. Darl Pikes).  She tells me she had one heart cath in 2013.  She tells me that she did not get PCI.  She also notes frequent CP over the last year.  It typically occurs while active and is relieved by rest.  She has used NTG with relief.  She tells me her cardiologist in [place] has done 2 stress tests (both normal - last one in 07/2012).  She was tried on Ranexa with some improvement that did not last.  She recently moved back to [place].  She was recently married but her marriage is ending.  She was staying at a women's shelter but now is staying with her mother.  She notes worse CP over the last 2 weeks.  This comes on at rest and is pressure like with assoc dyspnea, nausea, diaphoresis.  It is similar to her prior angina.  She is having 6/10 chest pain in the office today.    Labs  (1/13):  K 3.3, Cr 0.8, ALT 26, HDL 41, LDL 70, Hgb 13  Labs (2/13):  K 4, Cr 0.8, TSH 20.7   Wt Readings from Last 3 Encounters:  02/21/13 197 lb (89.359 kg)  12/01/11 181 lb 9.6 oz (82.373 kg)  11/22/11 184 lb 12.8 oz (83.825 kg)     Past Medical History  Diagnosis Date  . CAD (coronary artery disease)     s/p BMS to RCA and LCX 2008. In-stent restenosis of RCA stent rx'd with Xience DES 12/09. Requires extensive sedation for cath. cath 11/10: LAD mild plaque. LCX 10% RCA 50% ISR (FFR 0.91/0.92) Distal RCA 40/50. Cath 2/12: LAD 20-30; CFX 20-30, stent with 20-30; RCA stent with 30-40 and dRCA 30-40; EF 55-60%;  06/04/2011 - Nonobstructive Cath  . Chronic chest pain   . Anxiety and depression   . H/O: suicide attempt     drug overdose 3/08  . Hypertension   . Obesity   . Tobacco abuse   . H/O ETOH abuse   . Acid reflux   . Arthritis   . Hyperlipidemia   . Pneumonia   . UTI (lower urinary tract infection)   . Colitis   . GERD (gastroesophageal  reflux disease)   . COPD (chronic obstructive pulmonary disease)     Current Outpatient Prescriptions  Medication Sig Dispense Refill  . albuterol (PROVENTIL) (2.5 MG/3ML) 0.083% nebulizer solution Take 2.5 mg by nebulization every 6 (six) hours as needed for wheezing.      Marland Kitchen aspirin EC 81 MG tablet Take 81 mg by mouth every morning.        Marland Kitchen atorvastatin (LIPITOR) 40 MG tablet Take 40 mg by mouth daily.      . Biotin (SUPER BIOTIN) 5 MG CAPS Take by mouth.      . budesonide-formoterol (SYMBICORT) 160-4.5 MCG/ACT inhaler Inhale 2 puffs into the lungs 2 (two) times daily.  1 Inhaler  12  . clopidogrel (PLAVIX) 75 MG tablet Take 1 tablet (75 mg total) by mouth every morning.  30 tablet  12  . DULoxetine (CYMBALTA) 60 MG capsule Take 60 mg by mouth daily.      Marland Kitchen esomeprazole (NEXIUM) 40 MG capsule Take 40 mg by mouth daily before breakfast. Take one capsule in the morning and one capsuls in the eveniong      . levothyroxine (SYNTHROID,  LEVOTHROID) 50 MCG tablet Take 50 mcg by mouth daily before breakfast.      . loratadine (CLARITIN) 10 MG tablet Take 10 mg by mouth every morning.       Marland Kitchen losartan (COZAAR) 50 MG tablet Take 50 mg by mouth daily.      . Multiple Vitamins-Minerals (MULTIVITAMIN WITH MINERALS) tablet Take 1 tablet by mouth every morning.       . nitroGLYCERIN (NITROSTAT) 0.4 MG SL tablet Place 1 tablet (0.4 mg total) under the tongue every 5 (five) minutes as needed. For chest pain  25 tablet  12  . OVER THE COUNTER MEDICATION Liquiod vitamin b complex with vitamin b12      . PROAIR HFA 108 (90 BASE) MCG/ACT inhaler INHALE TWO PUFFS EVERY 4 HOURS AS NEEDED FOR WHEEZING (SHORTNESS OF BREATH)  9 g  10  . Probiotic Product (PROBIOTIC PO) Take by mouth. Daily. Phillips colon health      . ranolazine (RANEXA) 500 MG 12 hr tablet Take 500 mg by mouth 2 (two) times daily.      Marland Kitchen SPIRIVA HANDIHALER 18 MCG inhalation capsule INHALE ONE DOSE EVERY DAY  30 each  6  . tiotropium (SPIRIVA) 18 MCG inhalation capsule Place 18 mcg into inhaler and inhale every morning. Take two inhalations once a day      . traZODone (DESYREL) 150 MG tablet Take by mouth at bedtime. Takes 2 tablets at night         No current facility-administered medications for this visit.    Allergies:    Allergies  Allergen Reactions  . Prednisone     psychosis  . Imdur [Isosorbide]     Low blood pressure  . Nitroglycerin     Patches--headaches  . Zoloft [Sertraline Hcl]     Severe diarrhea    Social History:  The patient  reports that she quit smoking about 2 years ago. Her smoking use included Cigarettes. She has a 35 pack-year smoking history. She has never used smokeless tobacco. She reports that she does not drink alcohol or use illicit drugs.   Family History:  The patient's family history includes Colon cancer in her father; Coronary artery disease in her mother; Emphysema in her mother; Prostate cancer in her father.   ROS:  Please see  the history of present illness.  She denies CP assoc with meals, melena, hematochezia, fever, cough.   All other systems reviewed and negative.   PHYSICAL EXAM: VS:  BP 109/76  Pulse 72  Ht 5\' 1"  (1.549 m)  Wt 197 lb (89.359 kg)  BMI 37.24 kg/m2 Well nourished, well developed, in no acute distress HEENT: normal Neck: no JVD Cardiac:  normal S1, S2; RRR; no murmur Lungs:  clear to auscultation bilaterally, no wheezing, rhonchi or rales Abd: soft, nontender, no hepatomegaly Ext: no edema Skin: warm and dry Neuro:  CNs 2-12 intact, no focal abnormalities noted  EKG:  NSR, HR 72, normal axis, no acute changes     ASSESSMENT AND PLAN:  1. Unstable Angina:  She has chronic CP with last cath in [place] demonstrating stable disease.  She tells me she had a cath in 2013 in [place] that was stable and has had 2 stress tests in the last year.  She has had escalating CP over the last 2 weeks.  She is now having rest pain in the office.  I have recommended admission to the hospital for further evaluation and treatment.  I discussed with Dr. Hillis Range (DOD).  Given her long hx of CP, he suggested I discuss this with Dr. Verne Carrow who knows her.  Of note, the patient does not want to see Dr. Verne Carrow in the future.  Dr. Clifton Shaylee Stanislawski agreed that the most appropriate course of action would be to admit her for further evaluation.  Will place her on IV heparin and NTP.  Will check CEs.  Plan cardiac cath tomorrow.   2. CAD:  Continue ASA, Plavix, statin, Ranexa.  Plan cath tomorrow. 3. Hypertension:  Controlled.  Continue current therapy.  4. Hyperlipidemia:  Continue statin. 5. Disposition:  Patient will be admitted to [facility] today and travel via EMS.  Signed, Tereso Newcomer, PA-C  02/21/2013 11:12 AM       Hillis Range MD

## 2013-02-21 NOTE — Progress Notes (Signed)
OXYGEN WAS ADMINISTERED TODAY 02-21-2013 2 LITER WITH O2 AT 98%. BP=110/80 P=74

## 2013-02-21 NOTE — Progress Notes (Signed)
ANTICOAGULATION CONSULT NOTE - Initial Consult  Pharmacy Consult for heparin Indication: chest pain/ACS  Allergies  Allergen Reactions  . Prednisone     psychosis  . Imdur [Isosorbide]     Low blood pressure  . Nitroglycerin     Patches--headaches  . Zoloft [Sertraline Hcl]     Severe diarrhea    Patient Measurements:   Heparin Dosing Weight: 62 kg  Vital Signs: BP: 127/79 mmHg (09/10 1255) Pulse Rate: 72 (09/10 1255)  Labs: No results found for this basename: HGB, HCT, PLT, APTT, LABPROT, INR, HEPARINUNFRC, CREATININE, CKTOTAL, CKMB, TROPONINI,  in the last 72 hours  The CrCl is unknown because both a height and weight (above a minimum accepted value) are required for this calculation.   Medical History: Past Medical History  Diagnosis Date  . CAD (coronary artery disease)     s/p BMS to RCA and LCX 2008. In-stent restenosis of RCA stent rx'd with Xience DES 12/09. Requires extensive sedation for cath. cath 11/10: LAD mild plaque. LCX 10% RCA 50% ISR (FFR 0.91/0.92) Distal RCA 40/50. Cath 2/12: LAD 20-30; CFX 20-30, stent with 20-30; RCA stent with 30-40 and dRCA 30-40; EF 55-60%;  06/04/2011 - Nonobstructive Cath  . Chronic chest pain   . Anxiety and depression   . H/O: suicide attempt     drug overdose 3/08  . Hypertension   . Obesity   . Tobacco abuse   . H/O ETOH abuse   . Acid reflux   . Arthritis   . Hyperlipidemia   . Pneumonia   . UTI (lower urinary tract infection)   . Colitis   . GERD (gastroesophageal reflux disease)   . COPD (chronic obstructive pulmonary disease)     Medications:  Prescriptions prior to admission  Medication Sig Dispense Refill  . albuterol (PROVENTIL) (2.5 MG/3ML) 0.083% nebulizer solution Take 2.5 mg by nebulization every 6 (six) hours as needed for wheezing.      Marland Kitchen aspirin EC 81 MG tablet Take 81 mg by mouth every morning.        Marland Kitchen atorvastatin (LIPITOR) 40 MG tablet Take 40 mg by mouth daily.      . Biotin (SUPER BIOTIN) 5  MG CAPS Take by mouth.      . budesonide-formoterol (SYMBICORT) 160-4.5 MCG/ACT inhaler Inhale 2 puffs into the lungs 2 (two) times daily.  1 Inhaler  12  . clopidogrel (PLAVIX) 75 MG tablet Take 1 tablet (75 mg total) by mouth every morning.  30 tablet  12  . DULoxetine (CYMBALTA) 60 MG capsule Take 60 mg by mouth daily.      Marland Kitchen esomeprazole (NEXIUM) 40 MG capsule Take 40 mg by mouth daily before breakfast. Take one capsule in the morning and one capsuls in the eveniong      . levothyroxine (SYNTHROID, LEVOTHROID) 50 MCG tablet Take 50 mcg by mouth daily before breakfast.      . loratadine (CLARITIN) 10 MG tablet Take 10 mg by mouth every morning.       Marland Kitchen losartan (COZAAR) 50 MG tablet Take 50 mg by mouth daily.      . Multiple Vitamins-Minerals (MULTIVITAMIN WITH MINERALS) tablet Take 1 tablet by mouth every morning.       . nitroGLYCERIN (NITROSTAT) 0.4 MG SL tablet Place 1 tablet (0.4 mg total) under the tongue every 5 (five) minutes as needed. For chest pain  25 tablet  12  . OVER THE COUNTER MEDICATION Liquiod vitamin b complex with vitamin b12      .  PROAIR HFA 108 (90 BASE) MCG/ACT inhaler INHALE TWO PUFFS EVERY 4 HOURS AS NEEDED FOR WHEEZING (SHORTNESS OF BREATH)  9 g  10  . Probiotic Product (PROBIOTIC PO) Take by mouth. Daily. Phillips colon health      . ranolazine (RANEXA) 500 MG 12 hr tablet Take 500 mg by mouth 2 (two) times daily.      Marland Kitchen SPIRIVA HANDIHALER 18 MCG inhalation capsule INHALE ONE DOSE EVERY DAY  30 each  6  . tiotropium (SPIRIVA) 18 MCG inhalation capsule Place 18 mcg into inhaler and inhale every morning. Take two inhalations once a day      . traZODone (DESYREL) 150 MG tablet Take by mouth at bedtime. Takes 2 tablets at night          Assessment: 57 yo lady to start heparin for USAP.   Goal of Therapy:  Heparin level 0.3-0.7 units/ml Monitor platelets by anticoagulation protocol: Yes   Plan:  Heparin bolus 3000 units and drip at 850 units/hr Check heparin  level 8 hours after start and daily while on heparin.  Talbert Cage P[place]et 02/21/2013,1:03 PM

## 2013-02-21 NOTE — Progress Notes (Addendum)
Per pt request NTG gtt stopped, states she has migraine, and that CP is still 7-8/10, non-radiating; EKG obtained and no changes noted; states the 2mg  of morphine did not help at all with her pain; Ward Givens, paged and made aware; awaiting new orders

## 2013-02-21 NOTE — Progress Notes (Signed)
Utilization review completed.  

## 2013-02-21 NOTE — Patient Instructions (Addendum)
YOU ARE BEING ADMITTED TO THE HOSPITAL TODAY  WE HAVE ADMINISTERED OXYGEN IN THE OFFICE TODAY

## 2013-02-21 NOTE — Progress Notes (Signed)
[address] [place], [place]  [address] Phone: [phone] Fax:  [phone]  Date:  02/21/2013   ID:  Kara Lamb [place],  December 27, 1955, MRN [id number]  PCP:  No primary provider on file.  Cardiologist:  Dr. Arvilla Meres => Dr. Verne Carrow => Dr. Rollene Rotunda     History of Present Illness: Kara Lamb is a 57 y.o. female who returns for evaluation of CP.  She has a hx of CAD, COPD and chronic chest pain. She's had previous stenting of her RCA and CFX c/b ISR of her RCA back in 2009. At that time, she underwent DES to her RCA. She also has a h/o HTN, HL, obesity, reflex sympathetic dystrophy and suicidal depression. Myoview in 12/2009:  EF 74% with no ischemia or infarct. Myoview 9/12:  No scar or ischemia, EF 70%.  LHC 05/2011:  LAD 20 throughout, pD1 50, mCFX 40, pRCA stent ok with minimal restenosis, mRCA stent 60% ISR, dRCA 20, EF 55% (FFR of mRCA 0.98 - no flow limiting).  Med Rx continued.  She has had Barrett's esophagus demonstrated on EGD in the past.  Last seen by Dr. Verne Carrow in 06/2011.  She saw her PCP yesterday with c/os CP and was referred for follow up today.    She moved to [place] in 2013.  She was followed by a cardiologist there (Dr. Darl Pikes).  She tells me she had one heart cath in 2013.  She tells me that she did not get PCI.  She also notes frequent CP over the last year.  It typically occurs while active and is relieved by rest.  She has used NTG with relief.  She tells me her cardiologist in [place] has done 2 stress tests (both normal - last one in 07/2012).  She was tried on Ranexa with some improvement that did not last.  She recently moved back to [place].  She was recently married but her marriage is ending.  She was staying at a women's shelter but now is staying with her mother.  She notes worse CP over the last 2 weeks.  This comes on at rest and is pressure like with assoc dyspnea, nausea, diaphoresis.  It is similar to  her prior angina.  She is having 6/10 chest pain in the office today.    Labs (1/13):  K 3.3, Cr 0.8, ALT 26, HDL 41, LDL 70, Hgb 13  Labs (2/13):  K 4, Cr 0.8, TSH 20.7   Wt Readings from Last 3 Encounters:  02/21/13 197 lb (89.359 kg)  12/01/11 181 lb 9.6 oz (82.373 kg)  11/22/11 184 lb 12.8 oz (83.825 kg)     Past Medical History  Diagnosis Date  . CAD (coronary artery disease)     s/p BMS to RCA and LCX 2008. In-stent restenosis of RCA stent rx'd with Xience DES 12/09. Requires extensive sedation for cath. cath 11/10: LAD mild plaque. LCX 10% RCA 50% ISR (FFR 0.91/0.92) Distal RCA 40/50. Cath 2/12: LAD 20-30; CFX 20-30, stent with 20-30; RCA stent with 30-40 and dRCA 30-40; EF 55-60%;  06/04/2011 - Nonobstructive Cath  . Chronic chest pain   . Anxiety and depression   . H/O: suicide attempt     drug overdose 3/08  . Hypertension   . Obesity   . Tobacco abuse   . H/O ETOH abuse   . Acid reflux   . Arthritis   . Hyperlipidemia   . Pneumonia   .  UTI (lower urinary tract infection)   . Colitis   . GERD (gastroesophageal reflux disease)   . COPD (chronic obstructive pulmonary disease)     Current Outpatient Prescriptions  Medication Sig Dispense Refill  . albuterol (PROVENTIL) (2.5 MG/3ML) 0.083% nebulizer solution Take 2.5 mg by nebulization every 6 (six) hours as needed for wheezing.      Marland Kitchen aspirin EC 81 MG tablet Take 81 mg by mouth every morning.        Marland Kitchen atorvastatin (LIPITOR) 40 MG tablet Take 40 mg by mouth daily.      . Biotin (SUPER BIOTIN) 5 MG CAPS Take by mouth.      . budesonide-formoterol (SYMBICORT) 160-4.5 MCG/ACT inhaler Inhale 2 puffs into the lungs 2 (two) times daily.  1 Inhaler  12  . clopidogrel (PLAVIX) 75 MG tablet Take 1 tablet (75 mg total) by mouth every morning.  30 tablet  12  . DULoxetine (CYMBALTA) 60 MG capsule Take 60 mg by mouth daily.      Marland Kitchen esomeprazole (NEXIUM) 40 MG capsule Take 40 mg by mouth daily before breakfast. Take one capsule in  the morning and one capsuls in the eveniong      . levothyroxine (SYNTHROID, LEVOTHROID) 50 MCG tablet Take 50 mcg by mouth daily before breakfast.      . loratadine (CLARITIN) 10 MG tablet Take 10 mg by mouth every morning.       Marland Kitchen losartan (COZAAR) 50 MG tablet Take 50 mg by mouth daily.      . Multiple Vitamins-Minerals (MULTIVITAMIN WITH MINERALS) tablet Take 1 tablet by mouth every morning.       . nitroGLYCERIN (NITROSTAT) 0.4 MG SL tablet Place 1 tablet (0.4 mg total) under the tongue every 5 (five) minutes as needed. For chest pain  25 tablet  12  . OVER THE COUNTER MEDICATION Liquiod vitamin b complex with vitamin b12      . PROAIR HFA 108 (90 BASE) MCG/ACT inhaler INHALE TWO PUFFS EVERY 4 HOURS AS NEEDED FOR WHEEZING (SHORTNESS OF BREATH)  9 g  10  . Probiotic Product (PROBIOTIC PO) Take by mouth. Daily. Phillips colon health      . ranolazine (RANEXA) 500 MG 12 hr tablet Take 500 mg by mouth 2 (two) times daily.      Marland Kitchen SPIRIVA HANDIHALER 18 MCG inhalation capsule INHALE ONE DOSE EVERY DAY  30 each  6  . tiotropium (SPIRIVA) 18 MCG inhalation capsule Place 18 mcg into inhaler and inhale every morning. Take two inhalations once a day      . traZODone (DESYREL) 150 MG tablet Take by mouth at bedtime. Takes 2 tablets at night         No current facility-administered medications for this visit.    Allergies:    Allergies  Allergen Reactions  . Prednisone     psychosis  . Imdur [Isosorbide]     Low blood pressure  . Nitroglycerin     Patches--headaches  . Zoloft [Sertraline Hcl]     Severe diarrhea    Social History:  The patient  reports that she quit smoking about 2 years ago. Her smoking use included Cigarettes. She has a 35 pack-year smoking history. She has never used smokeless tobacco. She reports that she does not drink alcohol or use illicit drugs.   Family History:  The patient's family history includes Colon cancer in her father; Coronary artery disease in her mother;  Emphysema in her mother; Prostate cancer in  her father.   ROS:  Please see the history of present illness.   She denies CP assoc with meals, melena, hematochezia, fever, cough.   All other systems reviewed and negative.   PHYSICAL EXAM: VS:  BP 109/76  Pulse 72  Ht 5\' 1"  (1.549 m)  Wt 197 lb (89.359 kg)  BMI 37.24 kg/m2 Well nourished, well developed, in no acute distress HEENT: normal Neck: no JVD Cardiac:  normal S1, S2; RRR; no murmur Lungs:  clear to auscultation bilaterally, no wheezing, rhonchi or rales Abd: soft, nontender, no hepatomegaly Ext: no edema Skin: warm and dry Neuro:  CNs 2-12 intact, no focal abnormalities noted  EKG:  NSR, HR 72, normal axis, no acute changes     ASSESSMENT AND PLAN:  1. Unstable Angina:  She has chronic CP with last cath in [place] demonstrating stable disease.  She tells me she had a cath in 2013 in [place] that was stable and has had 2 stress tests in the last year.  She has had escalating CP over the last 2 weeks.  She is now having rest pain in the office.  I have recommended admission to the hospital for further evaluation and treatment.  I discussed with Dr. Hillis Range (DOD).  Given her long hx of CP, he suggested I discuss this with Dr. Verne Carrow who knows her.  Of note, the patient does not want to see Dr. Verne Carrow in the future.  Dr. Clifton James agreed that the most appropriate course of action would be to admit her for further evaluation.  Will place her on IV heparin and NTP.  Will check CEs.  Plan cardiac cath tomorrow.   2. CAD:  Continue ASA, Plavix, statin, Ranexa.  Plan cath tomorrow. 3. Hypertension:  Controlled.  Continue current therapy.  4. Hyperlipidemia:  Continue statin. 5. Disposition:  Patient will be admitted to [facility] today and travel via EMS.  Signed, Tereso Newcomer, PA-C  02/21/2013 11:12 AM

## 2013-02-22 ENCOUNTER — Encounter (HOSPITAL_COMMUNITY)
Admission: AD | Disposition: A | Discharge status: Home / Self Care | Payer: Self-pay | Source: Ambulatory Visit | Attending: Internal Medicine

## 2013-02-22 ENCOUNTER — Encounter (HOSPITAL_COMMUNITY): Payer: Self-pay | Admitting: Nurse Practitioner

## 2013-02-22 DIAGNOSIS — I2510 Atherosclerotic heart disease of native coronary artery without angina pectoris: Secondary | ICD-10-CM | POA: Diagnosis not present

## 2013-02-22 DIAGNOSIS — I209 Angina pectoris, unspecified: Secondary | ICD-10-CM | POA: Diagnosis present

## 2013-02-22 HISTORY — PX: LEFT HEART CATHETERIZATION WITH CORONARY ANGIOGRAM: SHX5451

## 2013-02-22 LAB — CBC
Hemoglobin: 12.3 g/dL (ref 12.0–15.0)
MCH: 34.4 pg — ABNORMAL HIGH (ref 26.0–34.0)
MCV: 102.2 fL — ABNORMAL HIGH (ref 78.0–100.0)
RBC: 3.58 MIL/uL — ABNORMAL LOW (ref 3.87–5.11)
WBC: 7.9 10*3/uL (ref 4.0–10.5)

## 2013-02-22 LAB — HEPARIN LEVEL (UNFRACTIONATED): Heparin Unfractionated: 0.26 IU/mL — ABNORMAL LOW (ref 0.30–0.70)

## 2013-02-22 LAB — LIPID PANEL: Cholesterol: 145 mg/dL (ref 0–200)

## 2013-02-22 LAB — TSH: TSH: 2.57 u[IU]/mL (ref 0.350–4.500)

## 2013-02-22 SURGERY — LEFT HEART CATHETERIZATION WITH CORONARY ANGIOGRAM
Anesthesia: LOCAL

## 2013-02-22 MED ORDER — NITROGLYCERIN 0.2 MG/ML ON CALL CATH LAB
INTRAVENOUS | Status: AC
  Filled 2013-02-22: qty 1

## 2013-02-22 MED ORDER — MIDAZOLAM HCL 2 MG/2ML IJ SOLN
INTRAMUSCULAR | Status: AC
  Filled 2013-02-22: qty 2

## 2013-02-22 MED ORDER — FENTANYL CITRATE 0.05 MG/ML IJ SOLN
INTRAMUSCULAR | Status: AC
  Filled 2013-02-22: qty 2

## 2013-02-22 MED ORDER — ACETAMINOPHEN 325 MG PO TABS: 650.0000 mg | ORAL_TABLET | ORAL | Status: DC | PRN

## 2013-02-22 MED ORDER — HEPARIN (PORCINE) IN NACL 2-0.9 UNIT/ML-% IJ SOLN
INTRAMUSCULAR | Status: AC
  Filled 2013-02-22: qty 1000

## 2013-02-22 MED ORDER — HEPARIN SODIUM (PORCINE) 1000 UNIT/ML IJ SOLN
INTRAMUSCULAR | Status: AC
  Filled 2013-02-22: qty 1

## 2013-02-22 MED ORDER — AMLODIPINE BESYLATE 2.5 MG PO TABS: 2.5000 mg | ORAL_TABLET | Freq: Every day | ORAL | Status: DC

## 2013-02-22 MED ORDER — ONDANSETRON HCL 4 MG/2ML IJ SOLN: 4.0000 mg | Freq: Four times a day (QID) | INTRAMUSCULAR | Status: DC | PRN

## 2013-02-22 MED ORDER — VERAPAMIL HCL 2.5 MG/ML IV SOLN
INTRAVENOUS | Status: AC
  Filled 2013-02-22: qty 2

## 2013-02-22 MED ORDER — LIDOCAINE HCL (PF) 1 % IJ SOLN
INTRAMUSCULAR | Status: AC
  Filled 2013-02-22: qty 30

## 2013-02-22 MED ORDER — SODIUM CHLORIDE 0.9 % IV SOLN: INTRAVENOUS | Status: DC

## 2013-02-22 MED ORDER — TRAMADOL HCL 50 MG PO TABS: 50.0000 mg | ORAL_TABLET | Freq: Four times a day (QID) | ORAL | Status: DC | PRN

## 2013-02-22 NOTE — Interval H&P Note (Signed)
History and Physical Interval Note:  02/22/2013 7:51 AM  Kara Lamb  has presented today for surgery, with the diagnosis of cp  The various methods of treatment have been discussed with the patient and family. After consideration of risks, benefits and other options for treatment, the patient has consented to  Procedure(s): LEFT HEART CATHETERIZATION WITH CORONARY ANGIOGRAM (N/A) as a surgical intervention .  The patient's history has been reviewed, patient examined, no change in status, stable for surgery.  I have reviewed the patient's chart and labs.  Questions were answered to the patient's satisfaction.     [name].

## 2013-02-22 NOTE — Progress Notes (Signed)
D/c orders received;IV removed with gauze on, pt remains in stable condition, pt meds and instructions reviewed and given to pt; pt d/c to home, reviewed radial site d/c information with pt

## 2013-02-22 NOTE — CV Procedure (Signed)
    Cardiac Cath Note  Kara Lamb [id number] 1956-01-18  Procedure: left  Heart Cardiac Catheterization Note Indications:  CAD, angina symptoms  Procedure Details Consent: Obtained Time Out: Verified patient identification, verified procedure, site/side was marked, verified correct patient position, special equipment/implants available, Radiology Safety Procedures followed,  medications/allergies/relevent history reviewed, required imaging and test results available.  Performed   Medications: Fentanyl: 100 mcg IV Versed: 4 mg IV Heparin 4500 units IV Verapamil 3 mg IV  The right radial  artery was easily canulated using a modified Seldinger technique.  Hemodynamics:    LV pressure: 129/13 Aortic pressure: 119/68  Angiography   Left Main: short, normal  Left anterior Descending: mild diffuse irregularities,  The diags are small and have mild irregularities.  Left Circumflex: moderate in size.  Mild - moderate irregularities.  There is a 20-30% stenosis just proximal to the OM  stent.  The mid OM1 stent  is widely patent.    Right Coronary Artery: large and dominant, mild irregularities diffusely.  The mid RCA stent has moderate in-stent restenosis ~ 50-60%.    LV Gram: normal LV function. EF 60-65%  Complications: No apparent complications Patient did tolerate procedure well.  Contrast used: 55 ccs  Conclusions:   1. Mild - moderate CAD.  The mid RCA stent has moderate in-stent restenosis but does not appear to be flow restrictive .  The OM stent is widely patent.  2. Normal LV function.   Vesta Mixer, Montez Hageman., MD, [facility] 02/22/2013, 8:29 AM Office - [phone] Pager [phone]

## 2013-02-22 NOTE — Discharge Summary (Signed)
CARDIOLOGY DISCHARGE SUMMARY   Patient ID: Kara Lamb MRN: [id number] DOB/AGE: [birth year] 57 y.o.  Admit date: 02/21/2013 Discharge date: 02/22/2013  Primary Discharge Diagnosis:    Angina pectoris Secondary Discharge Diagnosis:    HYPERLIPIDEMIA-MIXED   HYPERTENSION, BENIGN   CAD, NATIVE VESSEL   COPD   GERD   Bipolar 1 disorder   Unspecified hypothyroidism  Procedures:  Cardiac catheterization, coronary arteriogram, left ventriculogram  Hospital Course: [name] is a 57 y.o. female with a history of CAD. She has had recurrent chest pain and came to the office where she was evaluated by Dr. Johney Frame and admitted for further treatment.  Her cardiac enzymes were negative for MI. Her other labs were reviewed, including a lipid profile and a TSH but no medication changes were indicated. There was concern for unstable anginal pain as she was having resting chest pain, so cardiac catheterization was indicated. Cardiac catheterization was performed on 02/22/2013, results are below. Medical therapy for coronary artery disease was recommended. She tolerated the procedure well.  Post-cath, her chest resolved. She was concerned about various causes of chest pain and risk factors for coronary artery disease. Greater than 30 minutes was spent discussing cardiac risk factor reduction, including weight loss and other controllable cardiac risk factors. Additionally, medication options for controlling chest pain were reviewed. She has not tolerated long-acting nitrates in the past with migraine headaches. She is unable to afford Ranexa. We will add a low dose of amlodipine and titrate this as her blood pressure will tolerate. She is encouraged to obtain a primary care physician to obtain chronic pain control medications for her neuropathy and fibromyalgia. We will give her a short-term prescription for tramadol but she is aware we will not refill this. His height is considered stable for  discharge, to follow up as an outpatient.  Labs:  Lab Results  Component Value Date   WBC 7.9 02/22/2013   HGB 12.3 02/22/2013   HCT 36.6 02/22/2013   MCV 102.2* 02/22/2013   PLT 211 02/22/2013     Recent Labs Lab 02/21/13 1349  NA 140  K 3.8  CL 104  CO2 26  BUN 10  CREATININE 0.58  CALCIUM 9.2  PROT 6.4  BILITOT 0.3  ALKPHOS 79  ALT 21  AST 25  GLUCOSE 98    Recent Labs  02/21/13 1349 02/21/13 1914 02/22/13 0107  TROPONINI <0.30 <0.30 <0.30   Lipid Panel     Component Value Date/Time   CHOL 145 02/22/2013 0450   TRIG 116 02/22/2013 0450   HDL 51 02/22/2013 0450   CHOLHDL 2.8 02/22/2013 0450   VLDL 23 02/22/2013 0450   LDLCALC 71 02/22/2013 0450    Pro B Natriuretic peptide (BNP)  Date/Time Value Range Status  02/21/2013  1:49 PM 15.1  0 - 125 pg/mL Final  07/15/2010  6:00 AM <30.0  0.0 - 100.0 pg/mL Final    Recent Labs  02/21/13 1349  INR 1.00   Lab Results  Component Value Date   TSH 2.570 02/21/2013    Radiology: X-ray Chest Pa And Lateral 02/21/2013   *RADIOLOGY REPORT*  Clinical Data: Chest pain and shortness of breath.  CHEST - 2 VIEW  Comparison: 12/01/2011  Findings: The heart size is normal.  There is minimal left lower lobe density, consistent with atelectasis or scarring.  Less likely, this could represent early infiltrate.  There is perihilar peribronchial thickening with, accentuated by shallow lung inflation.  No pleural effusions.  There  are degenerative changes throughout the spine.  IMPRESSION:  1.  Bronchitic changes, chronic versus acute. 2.  Minimal density in the left lower lobe, consistent with atelectasis or early infiltrate.   Original Report Authenticated By: Norva Pavlov, M.D.    Cardiac Cath: 02/22/2013 Left Main: short, normal  Left anterior Descending: mild diffuse irregularities, The diags are small and have mild irregularities.  Left Circumflex: moderate in size. Mild - moderate irregularities. There is a 20-30% stenosis just  proximal to the OM stent. The mid OM1 stent is widely patent.  Right Coronary Artery: large and dominant, mild irregularities diffusely. The mid RCA stent has moderate in-stent restenosis ~ 50-60%.  LV Gram: normal LV function. EF 60-65%  Complications: No apparent complications  Patient did tolerate procedure well.  Contrast used: 55 ccs  Conclusions:  1. Mild - moderate CAD. The mid RCA stent has moderate in-stent restenosis but does not appear to be flow restrictive . The OM stent is widely patent.  2. Normal LV function.   EKG: 02/22/2013 Vent. rate 66 BPM PR interval 150 ms QRS duration 88 ms QT/QTc 408/427 ms P-R-T axes 45 6 29  FOLLOW UP PLANS AND APPOINTMENTS Allergies  Allergen Reactions  . Prednisone     psychosis  . Imdur [Isosorbide]     Low blood pressure  . Nitroglycerin     Patches--headaches  . Zoloft [Sertraline Hcl]     Severe diarrhea     Medication List    STOP taking these medications       budesonide-formoterol 160-4.5 MCG/ACT inhaler  Commonly known as:  SYMBICORT     ranolazine 500 MG 12 hr tablet  Commonly known as:  RANEXA      TAKE these medications       amLODipine 2.5 MG tablet  Commonly known as:  NORVASC  Take 1 tablet (2.5 mg total) by mouth daily.     aspirin EC 81 MG tablet  Take 81 mg by mouth every morning.     atorvastatin 40 MG tablet  Commonly known as:  LIPITOR  Take 40 mg by mouth daily.     clopidogrel 75 MG tablet  Commonly known as:  PLAVIX  Take 1 tablet (75 mg total) by mouth every morning.     DULoxetine 60 MG capsule  Commonly known as:  CYMBALTA  Take 60 mg by mouth daily.     esomeprazole 40 MG capsule  Commonly known as:  NEXIUM  Take 40 mg by mouth daily before breakfast. Take one capsule in the morning and one capsuls in the eveniong     levothyroxine 50 MCG tablet  Commonly known as:  SYNTHROID, LEVOTHROID  Take 50 mcg by mouth daily before breakfast.     loratadine 10 MG tablet  Commonly  known as:  CLARITIN  Take 10 mg by mouth every morning.     losartan 50 MG tablet  Commonly known as:  COZAAR  Take 50 mg by mouth daily.     multivitamin with minerals tablet  Take 1 tablet by mouth every morning.     nitroGLYCERIN 0.4 MG SL tablet  Commonly known as:  NITROSTAT  Place 1 tablet (0.4 mg total) under the tongue every 5 (five) minutes as needed. For chest pain     albuterol (2.5 MG/3ML) 0.083% nebulizer solution  Commonly known as:  PROVENTIL  Take 2.5 mg by nebulization every 6 (six) hours as needed for wheezing.     PROAIR HFA 108 (90  BASE) MCG/ACT inhaler  Generic drug:  albuterol  INHALE TWO PUFFS EVERY 4 HOURS AS NEEDED FOR WHEEZING (SHORTNESS OF BREATH)     PROBIOTIC PO  Take by mouth. Daily. Phillips colon health     SPIRIVA HANDIHALER 18 MCG inhalation capsule  Generic drug:  tiotropium  INHALE ONE DOSE EVERY DAY     SUPER BIOTIN 5 MG Caps  Generic drug:  Biotin  Take by mouth.     traMADol 50 MG tablet  Commonly known as:  ULTRAM  Take 1 tablet (50 mg total) by mouth every 6 (six) hours as needed.     traZODone 150 MG tablet  Commonly known as:  DESYREL  - Take by mouth at bedtime. Takes 2 tablets at night   -         Discharge Orders   Future Appointments Provider Department Dept Phone   02/27/2013 10:00 AM Storm Frisk, MD [facility] [phone]   03/06/2013 2:40 PM Beatrice Lecher, PA-C [facility] Main Office [place]) [phone]   Future Orders Complete By Expires   Diet - low sodium heart healthy  As directed    Increase activity slowly  As directed      Follow-up Information   Follow up with Shan Levans, MD. (Keep appt.)    Specialty:  Pulmonary Disease   Contact information:   [address] [place] [address] [phone]       Follow up with Tereso Newcomer, PA-C On 03/06/2013. (at 2:40 pm.)    Specialty:  Physician Assistant   Contact information:   [address] [place] [address] [phone]       BRING ALL MEDICATIONS WITH YOU TO FOLLOW UP APPOINTMENTS  Time spent with patient to include physician time: 49 min Signed: Theodore Demark, PA-C 02/22/2013, 12:31 PM Co-Sign MD  Attending Note:   The patient was seen and examined.  Agree with assessment and plan as noted above.  Changes made to the above note as needed.  SEE cath note.  Patient's stents are patent.  Troponin levels are normal.    She will need to work on diet, exercise, and weight loss.  Continue meds.  Follow up with Dr. Antoine Poche.   Vesta Mixer, Montez Hageman., MD, [facility] 02/22/2013, 6:19 PM

## 2013-02-22 NOTE — Progress Notes (Signed)
ANTICOAGULATION CONSULT NOTE  Pharmacy Consult for Heparin Indication: chest pain/ACS  Allergies  Allergen Reactions  . Prednisone     psychosis  . Imdur [Isosorbide]     Low blood pressure  . Nitroglycerin     Patches--headaches  . Zoloft [Sertraline Hcl]     Severe diarrhea    Patient Measurements: Height: 5\' 1"  (154.9 cm) Weight: 198 lb 1.6 oz (89.858 kg) IBW/kg (Calculated) : 47.8 Heparin Dosing Weight: 68.6 kg  Vital Signs: Temp: 97.6 F (36.4 C) (09/11 0607) Temp src: Oral (09/11 0607) BP: 113/75 mmHg (09/11 0607) Pulse Rate: 72 (09/11 0607)  Labs:  Recent Labs  02/21/13 1349 02/21/13 1914 02/21/13 2056 02/22/13 0107 02/22/13 0450  HGB 12.9  --   --   --  12.3  HCT 37.4  --   --   --  36.6  PLT 212  --   --   --  211  APTT 25  --   --   --   --   LABPROT 13.0  --   --   --   --   INR 1.00  --   --   --   --   HEPARINUNFRC  --   --  0.13*  --  0.26*  CREATININE 0.58  --   --   --   --   TROPONINI <0.30 <0.30  --  <0.30  --     Estimated Creatinine Clearance: 80.1 ml/min (by C-G formula based on Cr of 0.58).   Medications:  Heparin @ 850 units/hr (8.5 ml/hr)  Assessment: 57 y.o. female with chest pain for heparin  Goal of Therapy:  Heparin level 0.3-0.7 units/ml Monitor platelets by anticoagulation protocol: Yes   Plan:  Increase Heparin 1200 units/hr F/U after cath  Geannie Risen, PharmD, BCPS   02/22/2013 6:39 AM

## 2013-02-22 NOTE — Progress Notes (Signed)
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  Pressure dressing applied to right radial sight. Will continue to monitor. [place], Mitzi Hansen

## 2013-02-27 ENCOUNTER — Ambulatory Visit (INDEPENDENT_AMBULATORY_CARE_PROVIDER_SITE_OTHER): Payer: BC Managed Care – PPO | Admitting: Critical Care Medicine

## 2013-02-27 ENCOUNTER — Encounter: Payer: Self-pay | Admitting: Critical Care Medicine

## 2013-02-27 VITALS — BP 110/72 | HR 79 | Temp 97.9°F | Ht 61.0 in | Wt 199.0 lb

## 2013-02-27 DIAGNOSIS — J449 Chronic obstructive pulmonary disease, unspecified: Secondary | ICD-10-CM

## 2013-02-27 DIAGNOSIS — J438 Other emphysema: Secondary | ICD-10-CM

## 2013-02-27 DIAGNOSIS — Z23 Encounter for immunization: Secondary | ICD-10-CM

## 2013-02-27 DIAGNOSIS — J439 Emphysema, unspecified: Secondary | ICD-10-CM

## 2013-02-27 MED ORDER — TIOTROPIUM BROMIDE MONOHYDRATE 18 MCG IN CAPS: ORAL_CAPSULE | RESPIRATORY_TRACT | Status: DC

## 2013-02-27 MED ORDER — ALBUTEROL SULFATE HFA 108 (90 BASE) MCG/ACT IN AERS: INHALATION_SPRAY | RESPIRATORY_TRACT | Status: DC

## 2013-02-27 MED ORDER — ALBUTEROL SULFATE (2.5 MG/3ML) 0.083% IN NEBU: 2.5000 mg | INHALATION_SOLUTION | Freq: Four times a day (QID) | RESPIRATORY_TRACT | Status: DC | PRN

## 2013-02-27 NOTE — Patient Instructions (Addendum)
Stay on Spiriva We will sign you up for Copd education via EMMI Flu vaccine PUlmonary function study Overnight sleep oxygen test Need records from [place], sign release Return 3 months

## 2013-02-27 NOTE — Progress Notes (Signed)
Subjective:    Patient ID: Kara Lamb, female    DOB: [birth year], 57 y.o.   MRN: [id number]  HPI  57 yo female with known hx of COPD   02/27/2013 Chief Complaint  Patient presents with  . Follow up    last seen 11/2011.  Has SOB, wheezing, chest tightness, and prod cough with dark green mucus.  No fever.  Would also like to discuss o2 qhs.     Pt moved away to [place]. Now has moved back.  Had cat issues before. Now back in the area.  Pt 9/10>>02/22/13 for chest pain. Adm from cards office. Cards w/u showed in stent stenosis of RCA Stent.   Notes some wheezing, notes more dyspnea as well.  Not using inhalers much. Not using SABA as before.  Saw a MD in [place].  ? Lung cancer L lung>>biopsy needle, results were neg.  Pt had PNA with this before.  Scar tissue as well, no CA.  ONO done 02/2012 pos with desat. Now oxygen off since 12/2012.  Tried to commit suicide 09/2012 and 12/2012.     CAT Score 02/27/2013  Total CAT Score 20       Past Medical History  Diagnosis Date  . CAD (coronary artery disease)     a. s/p BMS to RCA and LCX 2008; b. ISR of RCA rx'd with Xience DES 12/09; c. Requires extensive sedation for cath; d. cath 11/10: nonobs; e.Cath 2/12: nonobs; f. 05/2011 Cath: nonobs; g. 02/2013 nonobs, EF 60-65%.  . Chronic chest pain   . Anxiety and depression   . H/O: suicide attempt     drug overdose 3/08  . Hypertension   . Obesity   . Tobacco abuse   . H/O ETOH abuse   . Arthritis   . Hyperlipidemia   . Pneumonia   . UTI (lower urinary tract infection)   . Colitis   . GERD (gastroesophageal reflux disease)   . COPD (chronic obstructive pulmonary disease)   . Complication of anesthesia   . PONV (postoperative nausea and vomiting)   . Family history of anesthesia complication     TWIN SISTER also had problems waking      Family History  Problem Relation Age of Onset  . Coronary artery disease Mother   . Emphysema Mother   . Colon cancer Father   . Prostate cancer  Father      History   Social History  . Marital Status: Divorced    Spouse Name: N/A    Number of Children: 3  . Years of Education: N/A   Occupational History  .      Workmen's comp   Social History Main Topics  . Smoking status: Former Smoker -- 1.00 packs/day for 35 years    Types: Cigarettes    Quit date: 02/23/2010  . Smokeless tobacco: Never Used  . Alcohol Use: No     Comment: rarely  . Drug Use: No  . Sexual Activity: Not Currently   Other Topics Concern  . Not on file   Social History Narrative  . No narrative on file     Allergies  Allergen Reactions  . Prednisone     psychosis  . Imdur [Isosorbide]     Low blood pressure  . Nitroglycerin     Patches--headaches  . Zoloft [Sertraline Hcl]     Severe diarrhea     Outpatient Prescriptions Prior to Visit  Medication Sig Dispense Refill  . amLODipine (NORVASC) 2.5  MG tablet Take 1 tablet (2.5 mg total) by mouth daily.  30 tablet  11  . aspirin EC 81 MG tablet Take 81 mg by mouth every morning.        Marland Kitchen atorvastatin (LIPITOR) 40 MG tablet Take 40 mg by mouth daily.      . Biotin (SUPER BIOTIN) 5 MG CAPS Take by mouth.      . clopidogrel (PLAVIX) 75 MG tablet Take 1 tablet (75 mg total) by mouth every morning.  30 tablet  12  . DULoxetine (CYMBALTA) 60 MG capsule Take 60 mg by mouth daily.      Marland Kitchen esomeprazole (NEXIUM) 40 MG capsule Take 40 mg by mouth daily before breakfast. Take one capsule in the morning and one capsuls in the eveniong      . levothyroxine (SYNTHROID, LEVOTHROID) 50 MCG tablet Take 50 mcg by mouth daily before breakfast.      . loratadine (CLARITIN) 10 MG tablet Take 10 mg by mouth every morning.       Marland Kitchen losartan (COZAAR) 50 MG tablet Take 50 mg by mouth daily.      . Multiple Vitamins-Minerals (MULTIVITAMIN WITH MINERALS) tablet Take 1 tablet by mouth every morning.       . nitroGLYCERIN (NITROSTAT) 0.4 MG SL tablet Place 1 tablet (0.4 mg total) under the tongue every 5 (five) minutes as  needed. For chest pain  25 tablet  12  . Probiotic Product (PROBIOTIC PO) Take by mouth. Daily. Phillips colon health      . traMADol (ULTRAM) 50 MG tablet Take 1 tablet (50 mg total) by mouth every 6 (six) hours as needed.  30 tablet  0  . traZODone (DESYREL) 150 MG tablet Take by mouth at bedtime. Takes 2 tablets at night        . albuterol (PROVENTIL) (2.5 MG/3ML) 0.083% nebulizer solution Take 2.5 mg by nebulization every 6 (six) hours as needed for wheezing.      Marland Kitchen PROAIR HFA 108 (90 BASE) MCG/ACT inhaler INHALE TWO PUFFS EVERY 4 HOURS AS NEEDED FOR WHEEZING (SHORTNESS OF BREATH)  9 g  10  . SPIRIVA HANDIHALER 18 MCG inhalation capsule INHALE ONE DOSE EVERY DAY  30 each  6   No facility-administered medications prior to visit.     Review of Systems  Constitutional:   No  weight loss, night sweats,  Fevers, chills, + fatigue, or  lassitude.  HEENT:   No headaches,  Difficulty swallowing,  Tooth/dental problems, or  Sore throat,                No sneezing, itching, ear ache, nasal congestion, +post nasal drip,   CV:  No chest pain,  Orthopnea, PND, swelling in lower extremities, anasarca, dizziness, palpitations, syncope.   GI  No heartburn, indigestion, abdominal pain, nausea, vomiting, diarrhea, change in bowel habits, loss of appetite, bloody stools.   Resp:    No coughing up of blood.    No chest wall deformity  Skin: no rash or lesions.  GU: no dysuria, change in color of urine, no urgency or frequency.  No flank pain, no hematuria   MS:  No joint pain or swelling.  No decreased range of motion.  No back pain.  Psych:  No change in mood or affect. No depression or anxiety.  No memory loss.         Objective:   Physical Exam BP 110/72  Pulse 79  Temp(Src) 97.9 F (36.6 C) (Oral)  Ht 5\' 1"  (1.549 m)  Wt 199 lb (90.266 kg)  BMI 37.62 kg/m2  SpO2 98%  GEN: A/Ox3; pleasant , NAD, well nourished   HEENT:  [place]/AT,  EACs-clear, TMs-wnl, NOSE-clear, THROAT-clear,  no lesions, no postnasal drip or exudate noted.   NECK:  Supple w/ fair ROM; no JVD; normal carotid impulses w/o bruits; no thyromegaly or nodules palpated; no lymphadenopathy.  RESP  Coarse BS  w/o, wheezes/ rales/ or rhonchi.no accessory muscle use, no dullness to percussion  CARD:  RRR, no m/r/g  , no peripheral edema, pulses intact, no cyanosis or clubbing.  GI:   Soft & nt; nml bowel sounds; no organomegaly or masses detected.  Musco: Warm bil, no deformities or joint swelling noted.   Neuro: alert, no focal deficits noted.    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 Plan Stay on Spiriva We will sign you up for Copd education via EMMI Flu vaccine PUlmonary function study Overnight sleep oxygen test Need records from [place], sign release Return 3 months     Updated Medication List Outpatient Encounter Prescriptions as of 02/27/2013  Medication Sig Dispense Refill  . albuterol (PROAIR HFA) 108 (90 BASE) MCG/ACT inhaler INHALE TWO PUFFS EVERY 4 HOURS AS NEEDED FOR WHEEZING (SHORTNESS OF BREATH)  8.5 g  4  . albuterol (PROVENTIL) (2.5 MG/3ML) 0.083% nebulizer solution Take 3 mLs (2.5 mg total) by nebulization every 6 (six) hours as needed for wheezing.  140 mL  6  . ALPRAZolam (XANAX) 0.25 MG tablet Take 0.25-0.5 mg by mouth 2 (two) times daily as needed for sleep.      Marland Kitchen amLODipine (NORVASC) 2.5 MG tablet Take 1 tablet (2.5 mg total) by mouth daily.  30 tablet  11  . aspirin EC 81 MG tablet Take 81 mg by mouth every morning.        Marland Kitchen atorvastatin (LIPITOR) 40 MG tablet Take 40 mg by mouth daily.      . Biotin (SUPER BIOTIN) 5 MG CAPS Take by mouth.      . clopidogrel (PLAVIX) 75 MG tablet Take 1 tablet (75 mg total) by mouth every morning.  30 tablet  12  . DULoxetine (CYMBALTA) 60 MG capsule Take 60 mg by mouth daily.      Marland Kitchen esomeprazole (NEXIUM) 40 MG capsule Take 40 mg by mouth  daily before breakfast. Take one capsule in the morning and one capsuls in the eveniong      . levothyroxine (SYNTHROID, LEVOTHROID) 50 MCG tablet Take 50 mcg by mouth daily before breakfast.      . loratadine (CLARITIN) 10 MG tablet Take 10 mg by mouth every morning.       Marland Kitchen losartan (COZAAR) 50 MG tablet Take 50 mg by mouth daily.      . Multiple Vitamins-Minerals (MULTIVITAMIN WITH MINERALS) tablet Take 1 tablet by mouth every morning.       . nitroGLYCERIN (NITROSTAT) 0.4 MG SL tablet Place 1 tablet (0.4 mg total) under the tongue every 5 (five) minutes as needed. For chest pain  25 tablet  12  . Probiotic Product (PROBIOTIC PO) Take by mouth. Daily. Phillips colon health      . tiotropium (SPIRIVA HANDIHALER) 18 MCG inhalation capsule INHALE ONE DOSE EVERY DAY  30 capsule  6  . traMADol (ULTRAM) 50 MG tablet Take 1 tablet (50 mg total) by  mouth every 6 (six) hours as needed.  30 tablet  0  . traZODone (DESYREL) 150 MG tablet Take by mouth at bedtime. Takes 2 tablets at night        . [DISCONTINUED] albuterol (PROVENTIL) (2.5 MG/3ML) 0.083% nebulizer solution Take 2.5 mg by nebulization every 6 (six) hours as needed for wheezing.      . [DISCONTINUED] PROAIR HFA 108 (90 BASE) MCG/ACT inhaler INHALE TWO PUFFS EVERY 4 HOURS AS NEEDED FOR WHEEZING (SHORTNESS OF BREATH)  9 g  10  . [DISCONTINUED] SPIRIVA HANDIHALER 18 MCG inhalation capsule INHALE ONE DOSE EVERY DAY  30 each  6   No facility-administered encounter medications on file as of 02/27/2013.

## 2013-02-28 ENCOUNTER — Emergency Department (HOSPITAL_COMMUNITY)
Admission: EM | Admit: 2013-02-28 | Discharge: 2013-02-28 | Disposition: A | Discharge status: Other Acute Inpatient Hospital | Payer: BC Managed Care – PPO | Source: Home / Self Care | Attending: Emergency Medicine | Admitting: Emergency Medicine

## 2013-02-28 ENCOUNTER — Encounter (HOSPITAL_COMMUNITY): Payer: Self-pay

## 2013-02-28 ENCOUNTER — Emergency Department (HOSPITAL_COMMUNITY): Payer: BC Managed Care – PPO

## 2013-02-28 ENCOUNTER — Inpatient Hospital Stay (HOSPITAL_COMMUNITY)
Admission: AD | Admit: 2013-02-28 | Discharge: 2013-03-07 | DRG: 430 | Disposition: A | Discharge status: Home / Self Care | Payer: BC Managed Care – PPO | Source: Intra-hospital | Attending: Psychiatry | Admitting: Psychiatry

## 2013-02-28 DIAGNOSIS — R45851 Suicidal ideations: Secondary | ICD-10-CM

## 2013-02-28 DIAGNOSIS — K219 Gastro-esophageal reflux disease without esophagitis: Secondary | ICD-10-CM | POA: Insufficient documentation

## 2013-02-28 DIAGNOSIS — M129 Arthropathy, unspecified: Secondary | ICD-10-CM | POA: Insufficient documentation

## 2013-02-28 DIAGNOSIS — J449 Chronic obstructive pulmonary disease, unspecified: Secondary | ICD-10-CM | POA: Insufficient documentation

## 2013-02-28 DIAGNOSIS — F319 Bipolar disorder, unspecified: Secondary | ICD-10-CM | POA: Diagnosis present

## 2013-02-28 DIAGNOSIS — Z87891 Personal history of nicotine dependence: Secondary | ICD-10-CM | POA: Insufficient documentation

## 2013-02-28 DIAGNOSIS — Z9861 Coronary angioplasty status: Secondary | ICD-10-CM | POA: Insufficient documentation

## 2013-02-28 DIAGNOSIS — Z8701 Personal history of pneumonia (recurrent): Secondary | ICD-10-CM | POA: Insufficient documentation

## 2013-02-28 DIAGNOSIS — F411 Generalized anxiety disorder: Secondary | ICD-10-CM | POA: Diagnosis present

## 2013-02-28 DIAGNOSIS — F329 Major depressive disorder, single episode, unspecified: Secondary | ICD-10-CM | POA: Insufficient documentation

## 2013-02-28 DIAGNOSIS — I10 Essential (primary) hypertension: Secondary | ICD-10-CM | POA: Diagnosis present

## 2013-02-28 DIAGNOSIS — R450 Nervousness: Secondary | ICD-10-CM | POA: Insufficient documentation

## 2013-02-28 DIAGNOSIS — Z79899 Other long term (current) drug therapy: Secondary | ICD-10-CM | POA: Diagnosis not present

## 2013-02-28 DIAGNOSIS — F322 Major depressive disorder, single episode, severe without psychotic features: Secondary | ICD-10-CM | POA: Diagnosis present

## 2013-02-28 DIAGNOSIS — R079 Chest pain, unspecified: Secondary | ICD-10-CM | POA: Insufficient documentation

## 2013-02-28 DIAGNOSIS — Z87440 Personal history of urinary (tract) infections: Secondary | ICD-10-CM | POA: Insufficient documentation

## 2013-02-28 DIAGNOSIS — R110 Nausea: Secondary | ICD-10-CM | POA: Insufficient documentation

## 2013-02-28 DIAGNOSIS — E669 Obesity, unspecified: Secondary | ICD-10-CM | POA: Insufficient documentation

## 2013-02-28 DIAGNOSIS — F32A Depression, unspecified: Secondary | ICD-10-CM

## 2013-02-28 DIAGNOSIS — Z7902 Long term (current) use of antithrombotics/antiplatelets: Secondary | ICD-10-CM | POA: Insufficient documentation

## 2013-02-28 DIAGNOSIS — J4489 Other specified chronic obstructive pulmonary disease: Secondary | ICD-10-CM | POA: Diagnosis present

## 2013-02-28 DIAGNOSIS — I2510 Atherosclerotic heart disease of native coronary artery without angina pectoris: Secondary | ICD-10-CM | POA: Diagnosis present

## 2013-02-28 DIAGNOSIS — Z7982 Long term (current) use of aspirin: Secondary | ICD-10-CM | POA: Insufficient documentation

## 2013-02-28 DIAGNOSIS — E785 Hyperlipidemia, unspecified: Secondary | ICD-10-CM | POA: Insufficient documentation

## 2013-02-28 DIAGNOSIS — F3289 Other specified depressive episodes: Secondary | ICD-10-CM | POA: Insufficient documentation

## 2013-02-28 LAB — COMPREHENSIVE METABOLIC PANEL
ALT: 30 U/L (ref 0–35)
AST: 32 U/L (ref 0–37)
Alkaline Phosphatase: 92 U/L (ref 39–117)
CO2: 27 mEq/L (ref 19–32)
Calcium: 10.2 mg/dL (ref 8.4–10.5)
Chloride: 98 mEq/L (ref 96–112)
GFR calc non Af Amer: >90 mL/min (ref >90)
Potassium: 3.5 mEq/L (ref 3.5–5.1)
Sodium: 139 mEq/L (ref 135–145)

## 2013-02-28 LAB — RAPID URINE DRUG SCREEN, HOSP PERFORMED
Amphetamines: NOT DETECTED
Barbiturates: NOT DETECTED
Opiates: NOT DETECTED
Tetrahydrocannabinol: NOT DETECTED

## 2013-02-28 LAB — ACETAMINOPHEN LEVEL: Acetaminophen (Tylenol), Serum: <15 ug/mL (ref 10–30)

## 2013-02-28 LAB — PROTIME-INR: Prothrombin Time: 12.2 seconds (ref 11.6–15.2)

## 2013-02-28 LAB — CBC
Hemoglobin: 14.1 g/dL (ref 12.0–15.0)
Platelets: 232 10*3/uL (ref 150–400)
RBC: 4.09 MIL/uL (ref 3.87–5.11)
WBC: 8 10*3/uL (ref 4.0–10.5)

## 2013-02-28 MED ORDER — LORAZEPAM 1 MG PO TABS: 1.0000 mg | ORAL_TABLET | Freq: Three times a day (TID) | ORAL | Status: DC | PRN

## 2013-02-28 MED ORDER — LORATADINE 10 MG PO TABS: 10.0000 mg | ORAL_TABLET | Freq: Every day | ORAL | Status: DC

## 2013-02-28 MED ORDER — DULOXETINE HCL 60 MG PO CPEP: 60.0000 mg | ORAL_CAPSULE | Freq: Every day | ORAL | Status: DC

## 2013-02-28 MED ORDER — LEVOTHYROXINE SODIUM 50 MCG PO TABS
50.0000 ug | ORAL_TABLET | Freq: Every day | ORAL | Status: DC
  Filled 2013-02-28: qty 1

## 2013-02-28 MED ORDER — PANTOPRAZOLE SODIUM 40 MG PO TBEC
40.0000 mg | DELAYED_RELEASE_TABLET | Freq: Every day | ORAL | Status: DC
  Administered 2013-02-28: 40 mg via ORAL
  Filled 2013-02-28: qty 1

## 2013-02-28 MED ORDER — AMLODIPINE BESYLATE 2.5 MG PO TABS
2.5000 mg | ORAL_TABLET | Freq: Every day | ORAL | Status: DC
  Filled 2013-02-28: qty 1

## 2013-02-28 MED ORDER — ATORVASTATIN CALCIUM 40 MG PO TABS: 40.0000 mg | ORAL_TABLET | Freq: Every day | ORAL | Status: DC

## 2013-02-28 MED ORDER — ZOLPIDEM TARTRATE 5 MG PO TABS: 5.0000 mg | ORAL_TABLET | Freq: Every evening | ORAL | Status: DC | PRN

## 2013-02-28 MED ORDER — NITROGLYCERIN 0.4 MG SL SUBL: 0.4000 mg | SUBLINGUAL_TABLET | SUBLINGUAL | Status: DC | PRN

## 2013-02-28 MED ORDER — ALBUTEROL SULFATE (5 MG/ML) 0.5% IN NEBU: 2.5000 mg | INHALATION_SOLUTION | RESPIRATORY_TRACT | Status: DC | PRN

## 2013-02-28 MED ORDER — LOSARTAN POTASSIUM 50 MG PO TABS
50.0000 mg | ORAL_TABLET | Freq: Every day | ORAL | Status: DC
  Filled 2013-02-28: qty 1

## 2013-02-28 MED ORDER — ALUM & MAG HYDROXIDE-SIMETH 200-200-20 MG/5ML PO SUSP: 30.0000 mL | ORAL | Status: DC | PRN

## 2013-02-28 MED ORDER — ONDANSETRON HCL 4 MG PO TABS
4.0000 mg | ORAL_TABLET | Freq: Three times a day (TID) | ORAL | Status: DC | PRN
  Administered 2013-02-28: 4 mg via ORAL
  Filled 2013-02-28: qty 1

## 2013-02-28 MED ORDER — IBUPROFEN 200 MG PO TABS: 600.0000 mg | ORAL_TABLET | Freq: Three times a day (TID) | ORAL | Status: DC | PRN

## 2013-02-28 MED ORDER — ASPIRIN EC 81 MG PO TBEC: 81.0000 mg | DELAYED_RELEASE_TABLET | Freq: Every day | ORAL | Status: DC

## 2013-02-28 MED ORDER — CLOPIDOGREL BISULFATE 75 MG PO TABS
75.0000 mg | ORAL_TABLET | Freq: Every day | ORAL | Status: DC
  Filled 2013-02-28: qty 1

## 2013-02-28 MED ORDER — TRAZODONE HCL 50 MG PO TABS
50.0000 mg | ORAL_TABLET | Freq: Every day | ORAL | Status: DC
  Administered 2013-02-28: 50 mg via ORAL
  Filled 2013-02-28: qty 1

## 2013-02-28 MED ORDER — ATORVASTATIN CALCIUM 40 MG PO TABS
40.0000 mg | ORAL_TABLET | Freq: Every day | ORAL | Status: DC
  Administered 2013-02-28: 40 mg via ORAL
  Filled 2013-02-28: qty 1

## 2013-02-28 MED ORDER — NICOTINE 21 MG/24HR TD PT24: 21.0000 mg | MEDICATED_PATCH | Freq: Every day | TRANSDERMAL | Status: DC

## 2013-02-28 MED ORDER — CLOPIDOGREL BISULFATE 75 MG PO TABS
75.0000 mg | ORAL_TABLET | Freq: Every day | ORAL | Status: DC
  Administered 2013-02-28: 75 mg via ORAL
  Filled 2013-02-28: qty 1

## 2013-02-28 MED ORDER — ALBUTEROL SULFATE HFA 108 (90 BASE) MCG/ACT IN AERS: 2.0000 | INHALATION_SPRAY | RESPIRATORY_TRACT | Status: DC | PRN

## 2013-02-28 NOTE — ED Provider Notes (Signed)
Medical screening examination/treatment/procedure(s) were performed by non-physician practitioner and as supervising physician I was immediately available for consultation/collaboration.   Shreyansh Tiffany, MD 02/28/13 2317 

## 2013-02-28 NOTE — ED Provider Notes (Signed)
CSN: [id number]     Arrival date & time 02/28/13  1759 History   First MD Initiated Contact with Patient 02/28/13 1853     Chief Complaint  Patient presents with  . Depression  . Suicidal  . Medical Clearance  . Chest Pain   (Consider location/radiation/quality/duration/timing/severity/associated sxs/prior Treatment) HPI Comments: 57 year old female with a past medical history of anxiety, depression, suicide attempts, CAD and COPD presents to the emergency department complaining of worsening depression and suicidal ideation with a plan to hang herself. Patient states she has a history of suicide attempts in the past, was hospitalized a few months back. She is under a lot of increased stress, separating from her husband and having issues at home with her mother. States she feels worthless. Also states she has intermittent chest pain and nausea for the past few months, had a cardiac cath one week ago on September 10 which showed 1. Mild - moderate CAD.  The mid RCA stent has moderate in-stent restenosis but does not appear to be flow restrictive .  The OM stent is widely patent.   2. Normal LV function.  Denies any new symptoms. Denies drug or alcohol use. States she is taking her antidepressant as prescribed.  Patient is a 57 y.o. female presenting with chest pain. The history is provided by the patient.  Chest Pain Associated symptoms: nausea   Associated symptoms: no diaphoresis, no dizziness, no headache and no weakness     Past Medical History  Diagnosis Date  . CAD (coronary artery disease)     a. s/p BMS to RCA and LCX 2008; b. ISR of RCA rx'd with Xience DES 12/09; c. Requires extensive sedation for cath; d. cath 11/10: nonobs; e.Cath 2/12: nonobs; f. 05/2011 Cath: nonobs; g. 02/2013 nonobs, EF 60-65%.  . Chronic chest pain   . Anxiety and depression   . H/O: suicide attempt     drug overdose 3/08  . Hypertension   . Obesity   . Tobacco abuse   . H/O ETOH abuse   . Arthritis    . Hyperlipidemia   . Pneumonia   . UTI (lower urinary tract infection)   . Colitis   . GERD (gastroesophageal reflux disease)   . COPD (chronic obstructive pulmonary disease)   . Complication of anesthesia   . PONV (postoperative nausea and vomiting)   . Family history of anesthesia complication     TWIN SISTER also had problems waking    Past Surgical History  Procedure Laterality Date  . Ptca      stent  . Colon biopsy  08/12/2008  . Tubal ligation  1982  . Cesarean section      x 3  . Carpal tunnel release     Family History  Problem Relation Age of Onset  . Coronary artery disease Mother   . Emphysema Mother   . Colon cancer Father   . Prostate cancer Father    History  Substance Use Topics  . Smoking status: Former Smoker -- 1.00 packs/day for 35 years    Types: Cigarettes    Quit date: 02/23/2010  . Smokeless tobacco: Never Used  . Alcohol Use: No     Comment: rarely   OB History   Grav Para Term Preterm Abortions TAB SAB Ect Mult Living                 Review of Systems  Constitutional: Negative for diaphoresis.  Eyes: Negative for visual disturbance.  Cardiovascular: Positive  for chest pain.  Gastrointestinal: Positive for nausea.  Neurological: Negative for dizziness, weakness, light-headedness and headaches.  Psychiatric/Behavioral: Positive for suicidal ideas and dysphoric mood. The patient is nervous/anxious.   All other systems reviewed and are negative.    Allergies  Prednisone; Imdur; Nitroglycerin; and Zoloft  Home Medications   Current Outpatient Rx  Name  Route  Sig  Dispense  Refill  . albuterol (PROAIR HFA) 108 (90 BASE) MCG/ACT inhaler      INHALE TWO PUFFS EVERY 4 HOURS AS NEEDED FOR WHEEZING (SHORTNESS OF BREATH)   8.5 g   4   . albuterol (PROVENTIL) (2.5 MG/3ML) 0.083% nebulizer solution   Nebulization   Take 3 mLs (2.5 mg total) by nebulization every 6 (six) hours as needed for wheezing.   140 mL   6   . ALPRAZolam  (XANAX) 0.25 MG tablet   Oral   Take 0.25-0.5 mg by mouth 2 (two) times daily as needed for sleep.         Marland Kitchen amLODipine (NORVASC) 2.5 MG tablet   Oral   Take 1 tablet (2.5 mg total) by mouth daily.   30 tablet   11   . aspirin EC 81 MG tablet   Oral   Take 81 mg by mouth every morning.           Marland Kitchen atorvastatin (LIPITOR) 40 MG tablet   Oral   Take 40 mg by mouth daily.         . Biotin (SUPER BIOTIN) 5 MG CAPS   Oral   Take by mouth.         . clopidogrel (PLAVIX) 75 MG tablet   Oral   Take 1 tablet (75 mg total) by mouth every morning.   30 tablet   12   . DULoxetine (CYMBALTA) 60 MG capsule   Oral   Take 60 mg by mouth daily.         Marland Kitchen esomeprazole (NEXIUM) 40 MG capsule   Oral   Take 40 mg by mouth daily before breakfast. Take one capsule in the morning and one capsuls in the eveniong         . levothyroxine (SYNTHROID, LEVOTHROID) 50 MCG tablet   Oral   Take 50 mcg by mouth daily before breakfast.         . loratadine (CLARITIN) 10 MG tablet   Oral   Take 10 mg by mouth every morning.          Marland Kitchen losartan (COZAAR) 50 MG tablet   Oral   Take 50 mg by mouth daily.         . Multiple Vitamins-Minerals (MULTIVITAMIN WITH MINERALS) tablet   Oral   Take 1 tablet by mouth every morning.          . nitroGLYCERIN (NITROSTAT) 0.4 MG SL tablet   Sublingual   Place 1 tablet (0.4 mg total) under the tongue every 5 (five) minutes as needed. For chest pain   25 tablet   12   . Probiotic Product (PROBIOTIC PO)   Oral   Take by mouth. Daily. [facility] colon health         . tiotropium (SPIRIVA HANDIHALER) 18 MCG inhalation capsule      INHALE ONE DOSE EVERY DAY   30 capsule   6   . traMADol (ULTRAM) 50 MG tablet   Oral   Take 1 tablet (50 mg total) by mouth every 6 (six) hours as needed.  30 tablet   0   . traZODone (DESYREL) 150 MG tablet   Oral   Take by mouth at bedtime. Takes 2 tablets at night            BP 116/63  Pulse  89  Temp(Src) 97.8 F (36.6 C) (Oral)  Resp 16  SpO2 98% Physical Exam  Nursing note and vitals reviewed. Constitutional: She is oriented to person, place, and time. She appears well-developed and well-nourished. No distress.  HENT:  Head: Normocephalic and atraumatic.  Mouth/Throat: Oropharynx is clear and moist.  Eyes: Conjunctivae and EOM are normal. Pupils are equal, round, and reactive to light.  Neck: Normal range of motion. Neck supple.  Cardiovascular: Normal rate, regular rhythm and normal heart sounds.   Pulmonary/Chest: Effort normal and breath sounds normal.  Abdominal: Soft. Bowel sounds are normal. There is no tenderness.  Musculoskeletal: Normal range of motion. She exhibits no edema.  Neurological: She is alert and oriented to person, place, and time.  Skin: Skin is warm and dry. She is not diaphoretic.  Psychiatric: Her behavior is normal. She exhibits a depressed mood. She expresses suicidal ideation. She expresses no homicidal ideation. She expresses suicidal plans.  Tearful.    ED Course  Procedures (including critical care time) Labs Review Labs Reviewed  CBC - Abnormal; Notable for the following:    MCV 100.5 (*)    MCH 34.5 (*)    All other components within normal limits  COMPREHENSIVE METABOLIC PANEL - Abnormal; Notable for the following:    Glucose, Bld 103 (*)    All other components within normal limits  SALICYLATE LEVEL - Abnormal; Notable for the following:    Salicylate Lvl <2.0 (*)    All other components within normal limits  ACETAMINOPHEN LEVEL  ETHANOL  URINE RAPID DRUG SCREEN (HOSP PERFORMED)  TROPONIN I  PROTIME-INR    Date: 02/28/2013  Rate: 84  Rhythm: normal sinus rhythm  QRS Axis: left  Intervals: normal  ST/T Wave abnormalities: normal  Conduction Disutrbances:none  Narrative Interpretation: no stemi  Old EKG Reviewed: unchanged   Imaging Review Dg Chest 2 View  02/28/2013   CLINICAL DATA:  Chest pain, medical  clearance.  EXAM: CHEST  2 VIEW  COMPARISON:  02/21/2013  FINDINGS: Coarse linear interstitial opacities in the lung bases, left greater the right, stable. No new infiltrate or overt edema. Heart size normal. No effusion. Spurring in the lower thoracic spine.  IMPRESSION: Chronic bibasilar scar or atelectasis. No acute disease.   Electronically Signed   By: Oley Balm M.D.   On: 02/28/2013 19:22    MDM   1. Suicidal ideation   2. Depression      Patient with suicidal ideation and plan. Psych labs pending. TTS consult. Will also do a cardiac workup given her significant medical history. 8:30 PM Patient medically cleared.  Trevor Mace, PA-C 02/28/13 2314

## 2013-02-28 NOTE — ED Notes (Signed)
Pt c/o depression, SI w/ plan to hang herself, intermittent chest pain and nausea x "several months."  Pain score 8/10.  Pt sts she was hospitalized earlier this year for an overdose.  Pt sts recently split from husband.  Sts she has been taking prescribed medication w/o relief.  Sts she had a cardiac cath last week.
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
## 2013-02-28 NOTE — Assessment & Plan Note (Signed)
Gold C Copd moderate obstruction. Plan Stay on Spiriva We will sign you up for Copd education via EMMI Flu vaccine PUlmonary function study Overnight sleep oxygen test Need records from [place], sign release Return 3 months

## 2013-02-28 NOTE — ED Notes (Signed)
Patient discharge via ambulatory with a steady gait. No acute distress noted. 

## 2013-03-01 ENCOUNTER — Encounter (HOSPITAL_COMMUNITY): Payer: Self-pay

## 2013-03-01 DIAGNOSIS — F411 Generalized anxiety disorder: Secondary | ICD-10-CM | POA: Diagnosis present

## 2013-03-01 DIAGNOSIS — F322 Major depressive disorder, single episode, severe without psychotic features: Secondary | ICD-10-CM | POA: Diagnosis present

## 2013-03-01 DIAGNOSIS — F3110 Bipolar disorder, current episode manic without psychotic features, unspecified: Secondary | ICD-10-CM

## 2013-03-01 LAB — TSH: TSH: 10.458 u[IU]/mL — ABNORMAL HIGH (ref 0.350–4.500)

## 2013-03-01 MED ORDER — ARIPIPRAZOLE 5 MG PO TABS
5.0000 mg | ORAL_TABLET | Freq: Every day | ORAL | Status: DC
  Administered 2013-03-01 – 2013-03-06 (×6): 5 mg via ORAL
  Filled 2013-03-01 (×8): qty 1

## 2013-03-01 MED ORDER — TRAMADOL HCL 50 MG PO TABS
50.0000 mg | ORAL_TABLET | Freq: Four times a day (QID) | ORAL | Status: DC | PRN
  Administered 2013-03-01 – 2013-03-05 (×10): 50 mg via ORAL
  Filled 2013-03-01 (×10): qty 1

## 2013-03-01 MED ORDER — CLOPIDOGREL BISULFATE 75 MG PO TABS
75.0000 mg | ORAL_TABLET | Freq: Every day | ORAL | Status: DC
  Administered 2013-03-01 – 2013-03-04 (×4): 75 mg via ORAL
  Filled 2013-03-01 (×5): qty 1

## 2013-03-01 MED ORDER — ACETAMINOPHEN 325 MG PO TABS
650.0000 mg | ORAL_TABLET | Freq: Four times a day (QID) | ORAL | Status: DC | PRN
  Administered 2013-03-01: 650 mg via ORAL

## 2013-03-01 MED ORDER — DULOXETINE HCL 60 MG PO CPEP
60.0000 mg | ORAL_CAPSULE | Freq: Every day | ORAL | Status: DC
  Administered 2013-03-01 – 2013-03-06 (×6): 60 mg via ORAL
  Filled 2013-03-01 (×11): qty 1

## 2013-03-01 MED ORDER — TIOTROPIUM BROMIDE MONOHYDRATE 18 MCG IN CAPS
18.0000 ug | ORAL_CAPSULE | RESPIRATORY_TRACT | Status: DC
  Administered 2013-03-01 – 2013-03-06 (×6): 18 ug via RESPIRATORY_TRACT
  Filled 2013-03-01: qty 5

## 2013-03-01 MED ORDER — ATORVASTATIN CALCIUM 40 MG PO TABS
40.0000 mg | ORAL_TABLET | Freq: Every day | ORAL | Status: DC
  Administered 2013-03-01 – 2013-03-04 (×4): 40 mg via ORAL
  Filled 2013-03-01 (×5): qty 1

## 2013-03-01 MED ORDER — ASPIRIN EC 81 MG PO TBEC
81.0000 mg | DELAYED_RELEASE_TABLET | ORAL | Status: DC
  Administered 2013-03-01 – 2013-03-06 (×6): 81 mg via ORAL
  Filled 2013-03-01 (×11): qty 1

## 2013-03-01 MED ORDER — MAGNESIUM HYDROXIDE 400 MG/5ML PO SUSP: 30.0000 mL | Freq: Every day | ORAL | Status: DC | PRN

## 2013-03-01 MED ORDER — IBUPROFEN 600 MG PO TABS
600.0000 mg | ORAL_TABLET | ORAL | Status: DC | PRN
  Filled 2013-03-01: qty 1

## 2013-03-01 MED ORDER — ALUM & MAG HYDROXIDE-SIMETH 200-200-20 MG/5ML PO SUSP
30.0000 mL | ORAL | Status: DC | PRN
  Administered 2013-03-03: 30 mL via ORAL

## 2013-03-01 MED ORDER — LORATADINE 10 MG PO TABS
10.0000 mg | ORAL_TABLET | ORAL | Status: DC
  Administered 2013-03-01 – 2013-03-06 (×6): 10 mg via ORAL
  Filled 2013-03-01 (×11): qty 1

## 2013-03-01 MED ORDER — ADULT MULTIVITAMIN W/MINERALS CH
1.0000 | ORAL_TABLET | ORAL | Status: DC
  Administered 2013-03-01 – 2013-03-06 (×6): 1 via ORAL
  Filled 2013-03-01 (×9): qty 1

## 2013-03-01 MED ORDER — LOSARTAN POTASSIUM 50 MG PO TABS
50.0000 mg | ORAL_TABLET | Freq: Every day | ORAL | Status: DC
  Administered 2013-03-01 – 2013-03-06 (×5): 50 mg via ORAL
  Filled 2013-03-01 (×9): qty 1

## 2013-03-01 MED ORDER — ALBUTEROL SULFATE (5 MG/ML) 0.5% IN NEBU: 2.5000 mg | INHALATION_SOLUTION | Freq: Four times a day (QID) | RESPIRATORY_TRACT | Status: DC | PRN

## 2013-03-01 MED ORDER — PANTOPRAZOLE SODIUM 40 MG PO TBEC
40.0000 mg | DELAYED_RELEASE_TABLET | Freq: Every day | ORAL | Status: DC
  Administered 2013-03-01 – 2013-03-04 (×4): 40 mg via ORAL
  Filled 2013-03-01 (×6): qty 1

## 2013-03-01 MED ORDER — LEVOTHYROXINE SODIUM 50 MCG PO TABS
50.0000 ug | ORAL_TABLET | Freq: Every day | ORAL | Status: DC
  Administered 2013-03-01 – 2013-03-07 (×4): 50 ug via ORAL
  Filled 2013-03-01 (×8): qty 1

## 2013-03-01 MED ORDER — AMLODIPINE BESYLATE 2.5 MG PO TABS
2.5000 mg | ORAL_TABLET | Freq: Every day | ORAL | Status: DC
  Administered 2013-03-01 – 2013-03-06 (×5): 2.5 mg via ORAL
  Filled 2013-03-01 (×8): qty 1

## 2013-03-01 MED ORDER — TRAZODONE HCL 100 MG PO TABS
300.0000 mg | ORAL_TABLET | Freq: Every day | ORAL | Status: DC
  Administered 2013-03-01 – 2013-03-06 (×7): 300 mg via ORAL
  Filled 2013-03-01 (×2): qty 2
  Filled 2013-03-01: qty 6
  Filled 2013-03-01 (×2): qty 2
  Filled 2013-03-01: qty 6
  Filled 2013-03-01 (×4): qty 2
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
## 2013-03-01 NOTE — [facility] Suicide Risk Assessment (Signed)
[facility] Adult Inpatient Family/Significant Other Suicide Prevention Education  Suicide Prevention Education:   Patient Refusal for Family/Significant Other Suicide Prevention Education: The patient has refused to provide written consent for family/significant other to be provided Family/Significant Other Suicide Prevention Education during admission and/or prior to discharge.  Physician notified.  CSW provided suicide prevention information with patient.    The suicide prevention education provided includes the following:  Suicide risk factors  Suicide prevention and interventions  National Suicide Hotline telephone number  [facility] assessment telephone number  [facility] Emergency Assistance 911  [facility] and/or Residential Mobile Crisis Unit telephone number   Reyes Ivan, [place] 03/01/2013 2:42 PM

## 2013-03-01 NOTE — Care Management Utilization Note (Signed)
Per State Regulation 482.30  The chart was reviewed for necessity with respect to the patient's Admission/ Duration of stay. Admission 02/28/2013  Next Review Date:03/03/2013  Lacinda Axon, RN, BSN

## 2013-03-01 NOTE — Progress Notes (Signed)
Patient ID: Kara Lamb, female   DOB: 11-Oct-1955, 57 y.o.   MRN: [id number] She has been in bed today up for medication  And refused to go to lunch because her head hurt. She had been given a PRN but it had not had time to be effective. Tramadol given at 11:45.

## 2013-03-01 NOTE — Progress Notes (Signed)
Adult Psychoeducational Group Note  Date:  03/01/2013 Time:  1:38 PM  Group Topic/Focus:  Building Self Esteem:   The Focus of this group is helping patients become aware of the effects of self-esteem on their lives, the things they and others do that enhance or undermine their self-esteem, seeing the relationship between their level of self-esteem and the choices they make and learning ways to enhance self-esteem.  Participation Level:  Did Not Attend  Kara Lamb 03/01/2013, 1:38 PM
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
## 2013-03-01 NOTE — Progress Notes (Signed)
Recreation Therapy Notes  Date: 09.18.2014 Time: 3:00pm Location: [address] Dayroom  Group Topic: Communication, Team Building, Problem Solving  Goal Area(s) Addresses:  Patient will effectively work with peer towards shared goal.  Patient will identify skill used to make activity successful.  Patient will identify how skills used during activity can be used to reach post d/c goals.   Behavioral Response: Did not attend  Nima Bamburg L Brinson Tozzi, LRT/CTRS  Niomie Englert L 03/01/2013 4:34 PM 

## 2013-03-01 NOTE — Progress Notes (Signed)
Recreation Therapy Notes  Date: 09.18.2014 Time: 2:30pm Location: [address] Dayroom  Group Topic: Animal Assisted Activities (AAA)  Behavioral Response: Did not attend.   Georga Stys L Cha Gomillion, LRT/CTRS  Jasiyah Paulding L 03/01/2013 4:16 PM 

## 2013-03-01 NOTE — Progress Notes (Signed)
D   Pt is depressed and sad   She continues to complain of a headache and has isolated to her room most of the day   She is pleasant and cooperative on approach   Her behavior is appropriate  A   Verbal support given  Medications administered and effectiveness monitored   Q 15 min checks R   Pt safe at present

## 2013-03-01 NOTE — [facility] Group Notes (Signed)
[facility] LCSW Group Therapy  03/01/2013  1:15 PM   Type of Therapy:  Group Therapy  Participation Level:  Did Not Attend  Reyes Ivan, LCSWA 03/01/2013 2:54 PM
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
## 2013-03-02 MED ORDER — CLONAZEPAM 0.5 MG PO TABS
0.5000 mg | ORAL_TABLET | Freq: Three times a day (TID) | ORAL | Status: DC | PRN
  Administered 2013-03-02 – 2013-03-06 (×15): 0.5 mg via ORAL
  Filled 2013-03-02 (×16): qty 1

## 2013-03-02 NOTE — Progress Notes (Signed)
Patient did attend the evening karaoke group. Pt was engaged and supportive but did not sing a song.   

## 2013-03-02 NOTE — Tx Team (Signed)
Interdisciplinary Treatment Plan Update (Adult)  Date: 03/02/2013  Time Reviewed:  9:45 AM  Progress in Treatment: Attending groups: Yes Participating in groups:  Yes Taking medication as prescribed:  Yes Tolerating medication:  Yes Family/Significant othe contact made: No, pt refused Patient understands diagnosis:  Yes Discussing patient identified problems/goals with staff:  Yes Medical problems stabilized or resolved:  Yes Denies suicidal/homicidal ideation: Yes Issues/concerns per patient self-inventory:  Yes Other:  New problem(s) identified: N/A  Discharge Plan or Barriers: Pt will follow up at [facility] for medication management and therapy.    Reason for Continuation of Hospitalization: Anxiety Depression Medication Stabilization  Comments: N/A  Estimated length of stay: 3-5 days  For review of initial/current patient goals, please see plan of care.  Attendees: Patient:     Family:     Physician:  Dr. Dub Mikes 03/02/2013 10:22 AM   Nursing:   Alease Frame, RN 03/02/2013 10:22 AM   Clinical Social Worker:  Reyes Ivan, LCSWA 03/02/2013 10:22 AM   Other: Onnie Boer, RN case manager 03/02/2013 10:22 AM   Other:  Trula Slade, LCSWA 03/02/2013 10:22 AM   Other:  Serena Colonel, NP 03/02/2013 10:22 AM   Other:  Onnie Boer, RN case manager 03/02/2013 10:22 AM   Other:    Other:    Other:    Other:    Other:    Other:     Scribe for Treatment Team:   Carmina Miller, 03/02/2013 , 10:22 AM

## 2013-03-02 NOTE — Progress Notes (Signed)
[facility] Group Notes:  (Nursing/MHT/Case Management/Adjunct)  Date:  03/02/2013  Time:  2000  Type of Therapy:  Psychoeducational Skills  Participation Level:  Active  Participation Quality:  Attentive  Affect:  Appropriate  Cognitive:  Appropriate  Insight:  Good  Engagement in Group:  Engaged  Modes of Intervention:  Education  Summary of Progress/Problems: The patient mentioned in group this evening that she had a good day. For one, she said that she had a better day. Secondly, she expressed that her migraines were minimal as compared to most days. Finally, she verbalized that she worked on trying to lower anxiety. Her goal for tomorrow is to be "less preoccupied with my thoughts".   Kara Lamb S 03/02/2013, 11:10 PM

## 2013-03-02 NOTE — Progress Notes (Signed)
D: Patient denies SI/HI/AVH. Patient rates hopelessness as 9 and depression as 9.  Patient affect is anxious. Mood is depressed and anxious.  Pt states that she requested medication for sleep last night that was effective.  Pt states that her appetite is improving.  Pt states that her energy level is low and her ability to pay attention is poor. Patient did attend group. Patient visible on the milieu. No distress noted. A: Support and encouragement offered. Scheduled medications given to pt. Q 15 min checks continued for patient safety. R: Patient receptive. Patient remains safe on the unit.

## 2013-03-02 NOTE — Progress Notes (Signed)
Adult Psychoeducational Group Note  Date:  03/02/2013 Time:  12:32 PM  Group Topic/Focus:  Relapse Prevention Planning:   The focus of this group is to define relapse and discuss the need for planning to combat relapse.  Participation Level:  Did Not Attend    Kara Lamb N 03/02/2013, 12:32 PM 

## 2013-03-02 NOTE — [facility] Group Notes (Signed)
[facility] LCSW Group Therapy  03/02/2013  1:15 PM   Type of Therapy:  Group Therapy  Participation Level:  Active  Participation Quality:  Appropriate and Attentive  Affect:  Appropriate, Flat and Depressed  Cognitive:  Alert and Appropriate  Insight:  Developing/Improving and Engaged  Engagement in Therapy:  Developing/Improving and Engaged  Modes of Intervention:  Clarification, Confrontation, Discussion, Education, Exploration, Limit-setting, Orientation, Problem-solving, Rapport Building, Dance movement psychotherapist, Socialization and Support  Summary of Progress/Problems: The topic for today was feelings about relapse.  Pt discussed what relapse prevention is to them and identified triggers that they are on the path to relapse.  Pt processed their feeling towards relapse and was able to relate to peers.  Pt discussed coping skills that can be used for relapse prevention.   Pt shared that she has been depressed for a long time and has had serious suicide attempts in the past.  Pt was able to process events that led up to this hospitalization, stating that she researched hanging as a means to commit suicide.  Pt states that she decided to get help before following through with this plan, which was a new thing to do for her.  Pt states that relapse is hard for her because she feels she is continuously struggling with depression and SI, and was able to relate to peers with this.  Pt actively participated and was engaged in group discussion.    Kara Lamb, [place] 03/02/2013 2:34 PM
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
## 2013-03-02 NOTE — [facility] Group Notes (Signed)
[facility] LCSW Aftercare Discharge Planning Group Note   03/02/2013  8:45 AM  Participation Quality:  Did Not Attend  Reyes Ivan, LCSWA 03/02/2013 9:42 AM

## 2013-03-03 MED ORDER — PROMETHAZINE HCL 25 MG PO TABS
25.0000 mg | ORAL_TABLET | ORAL | Status: DC | PRN
  Administered 2013-03-03 – 2013-03-07 (×5): 25 mg via ORAL
  Filled 2013-03-03 (×5): qty 1

## 2013-03-03 MED ORDER — ONDANSETRON HCL 4 MG PO TABS
8.0000 mg | ORAL_TABLET | Freq: Three times a day (TID) | ORAL | Status: DC | PRN
  Administered 2013-03-03 (×2): 8 mg via ORAL
  Filled 2013-03-03: qty 2

## 2013-03-03 MED ORDER — LOPERAMIDE HCL 2 MG PO CAPS
4.0000 mg | ORAL_CAPSULE | ORAL | Status: DC | PRN
  Administered 2013-03-03 – 2013-03-06 (×7): 4 mg via ORAL
  Filled 2013-03-03: qty 2
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
## 2013-03-03 NOTE — [facility] Group Notes (Signed)
[facility] Group Notes:  (Nursing/MHT/Case Management/Adjunct)  Date:  03/03/2013  Time:  12:15 PM  Type of Therapy: Psychoeducational Skills  Participation Level: Did Not Attend     Buford Dresser 03/03/2013, 12:15 PM
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
## 2013-03-03 NOTE — Progress Notes (Signed)
Patient ID: Kara Lamb, female   DOB: [birth year], 57 y.o.   MRN: [id number]  D: Pt has been very sick this morning, she had several episodes of N/V and diarrhea. Pt was given Zofran, Vistaril was also given. Pt has not attended any groups and has engaged in any treatment. Pt reported on her self inventory sheet that she was having some SI, and was able to contract for safety.  Pt reported being negative SI/HI, no AH/VH noted. A: 15 min checks continued for patient safety. R: Pts safety maintained.

## 2013-03-03 NOTE — Progress Notes (Signed)
D.  Pt continues to suffer from nausea but no vomiting or diarrhea at this time.  She is not convinced that it is her lactose intolerance causing this.  She is afebrile.  Continues to endorse passive suicidal ideation and depression.  Did not attend evening AA group due to not feeling well.  Interacting appropriately within milieu.  A. Support and encouragement offered  R.  Pt remains safe, will continue to monitor.

## 2013-03-03 NOTE — Progress Notes (Signed)
Patient did not attend the evening speaker AA meeting or the evening wrap up group. Pt remained in bed during group after experiencing diarrhea all day.

## 2013-03-03 NOTE — Progress Notes (Signed)
D.  Pt pleasant on approach, denies SI/HI/hallucinations at this time.  Positive for evening AA group, see group notes.  Interacting appropriately within milieu.  Complaint of headache, but no other complaints voiced.  A.  Support and encouragement offered  R.  Pt remains safe, will continue to monitor.

## 2013-03-03 NOTE — [facility] Group Notes (Signed)
[facility] Group Notes: (Clinical Social Work)   03/03/2013      Type of Therapy:  Group Therapy   Participation Level:  Did Not Attend    Ambrose Mantle, LCSW 03/03/2013, 12:00 PM

## 2013-03-04 DIAGNOSIS — F411 Generalized anxiety disorder: Secondary | ICD-10-CM

## 2013-03-04 DIAGNOSIS — F332 Major depressive disorder, recurrent severe without psychotic features: Secondary | ICD-10-CM

## 2013-03-04 MED ORDER — ALUM HYDROXIDE-MAG TRISILICATE 80-20 MG PO CHEW
2.0000 | CHEWABLE_TABLET | Freq: Three times a day (TID) | ORAL | Status: DC
  Administered 2013-03-04 – 2013-03-05 (×3): 2 via ORAL
  Filled 2013-03-04 (×10): qty 2

## 2013-03-04 MED ORDER — PROCHLORPERAZINE 25 MG RE SUPP
25.0000 mg | Freq: Two times a day (BID) | RECTAL | Status: DC | PRN
  Administered 2013-03-04: 25 mg via RECTAL
  Filled 2013-03-04 (×3): qty 1

## 2013-03-04 MED ORDER — RISAQUAD PO CAPS
1.0000 | ORAL_CAPSULE | Freq: Every day | ORAL | Status: DC
  Administered 2013-03-04 – 2013-03-06 (×3): 1 via ORAL
  Filled 2013-03-04 (×5): qty 1

## 2013-03-04 MED ORDER — B COMPLEX-C PO TABS
1.0000 | ORAL_TABLET | Freq: Every day | ORAL | Status: DC
  Administered 2013-03-04 – 2013-03-06 (×3): 1 via ORAL
  Filled 2013-03-04 (×5): qty 1

## 2013-03-04 MED ORDER — ALUM HYDROXIDE-MAG CARBONATE 95-358 MG/15ML PO SUSP
30.0000 mL | Freq: Three times a day (TID) | ORAL | Status: DC
  Filled 2013-03-04 (×2): qty 30

## 2013-03-04 MED ORDER — CALCIUM CARBONATE ANTACID 500 MG PO CHEW: 400.0000 mg | CHEWABLE_TABLET | Freq: Three times a day (TID) | ORAL | Status: DC

## 2013-03-04 MED ORDER — BIOTIN 5 MG PO CAPS: ORAL_CAPSULE | Freq: Every day | ORAL | Status: DC

## 2013-03-04 MED ORDER — SIMETHICONE 80 MG PO CHEW
80.0000 mg | CHEWABLE_TABLET | Freq: Four times a day (QID) | ORAL | Status: DC | PRN
  Administered 2013-03-04 – 2013-03-06 (×4): 80 mg via ORAL
  Filled 2013-03-04: qty 1

## 2013-03-04 MED ORDER — DIPHENHYDRAMINE HCL 50 MG PO CAPS
100.0000 mg | ORAL_CAPSULE | Freq: Once | ORAL | Status: AC
  Administered 2013-03-04: 100 mg via ORAL
  Filled 2013-03-04: qty 2

## 2013-03-04 MED ORDER — PANTOPRAZOLE SODIUM 40 MG PO TBEC
40.0000 mg | DELAYED_RELEASE_TABLET | Freq: Two times a day (BID) | ORAL | Status: DC
  Administered 2013-03-05 – 2013-03-07 (×5): 40 mg via ORAL
  Filled 2013-03-04 (×8): qty 1

## 2013-03-04 MED ORDER — PHENYLEPH-SHARK LIV OIL-MO-PET 0.25-3-14-71.9 % RE OINT
TOPICAL_OINTMENT | Freq: Two times a day (BID) | RECTAL | Status: DC | PRN
  Administered 2013-03-04: 16:00:00 via RECTAL
  Filled 2013-03-04: qty 28.4

## 2013-03-04 MED ORDER — ALBUTEROL SULFATE HFA 108 (90 BASE) MCG/ACT IN AERS
2.0000 | INHALATION_SPRAY | Freq: Four times a day (QID) | RESPIRATORY_TRACT | Status: DC | PRN
  Administered 2013-03-04 – 2013-03-06 (×2): 2 via RESPIRATORY_TRACT

## 2013-03-04 NOTE — Progress Notes (Signed)
Patient ID: Kara Lamb, female   DOB: 18-Jun-1955, 57 y.o.   MRN: [id number] [facility] MD Progress Note  03/04/2013 4:33 PM Kara Lamb  MRN:  [id number]  Subjective:  "I have been sick with diarrhea x 2 days, As a result, my mood is low. I take Biotene for my stomach problems at home. I have been messing up on myself. I need help here".  Diagnosis:   DSM5: Depressive Disorders:  Major Depressive Disorder - Severe (296.23)  Axis I: Anxiety Disorder NOS and Major Depression, Recurrent severe Axis II: Cluster B Traits Axis III:  Past Medical History  Diagnosis Date  . CAD (coronary artery disease)     a. s/p BMS to RCA and LCX 2008; b. ISR of RCA rx'd with Xience DES 12/09; c. Requires extensive sedation for cath; d. cath 11/10: nonobs; e.Cath 2/12: nonobs; f. 05/2011 Cath: nonobs; g. 02/2013 nonobs, EF 60-65%.  . Chronic chest pain   . Anxiety and depression   . H/O: suicide attempt     drug overdose 3/08  . Hypertension   . Obesity   . Tobacco abuse   . H/O ETOH abuse   . Arthritis   . Hyperlipidemia   . Pneumonia   . UTI (lower urinary tract infection)   . Colitis   . GERD (gastroesophageal reflux disease)   . COPD (chronic obstructive pulmonary disease)   . Complication of anesthesia   . PONV (postoperative nausea and vomiting)   . Family history of anesthesia complication     TWIN SISTER also had problems waking    Axis IV: other psychosocial or environmental problems, problems related to social environment and problems with primary support group Axis V: 41-50 serious symptoms  ADL's:  Intact  Sleep: Poor  Appetite:  Poor  Suicidal Ideation:  Denies Homicidal Ideation:  Denies  Psychiatric Specialty Exam: Review of Systems  Constitutional: Negative.   HENT: Negative.   Eyes: Negative.   Respiratory: Negative.   Cardiovascular: Negative.   Gastrointestinal: Positive for nausea, vomiting and diarrhea.  Genitourinary: Negative.   Musculoskeletal:  Negative.   Skin: Negative.   Neurological: Negative.   Endo/Heme/Allergies: Negative.   Psychiatric/Behavioral: Positive for depression and substance abuse. Negative for suicidal ideas and hallucinations. The patient is nervous/anxious and has insomnia (Currently being stabilized with medication).     Blood pressure 104/69, pulse 102, temperature 97.6 F (36.4 C), temperature source Oral, resp. rate 18, height 5\' 1"  (1.549 m), weight 89.359 kg (197 lb).Body mass index is 37.24 kg/(m^2).  General Appearance: Casual  Eye Contact::  Fair  Speech:  Normal Rate  Volume:  Normal  Mood:  Anxious and Depressed  Affect:  Congruent  Thought Process:  Coherent  Orientation:  Full (Time, Place, and Person)  Thought Content:  WDL  Suicidal Thoughts:  No  Homicidal Thoughts:  No  Memory:  Immediate;   Fair Recent;   Fair Remote;   Fair  Judgement:  Fair  Insight:  Fair  Psychomotor Activity:  Decreased  Concentration:  Fair  Recall:  Fair  Akathisia:  No  Handed:  Right  AIMS (if indicated):     Assets:  Physical Health Resilience  Sleep:  Number of Hours: 6.25   Current Medications: Current Facility-Administered Medications  Medication Dose Route Frequency Provider Last Rate Last Dose  . acetaminophen (TYLENOL) tablet 650 mg  650 mg Oral Q6H PRN Audrea Muscat, NP   650 mg at 03/01/13 0637  . acidophilus (RISAQUAD) capsule  1 capsule  1 capsule Oral Daily Nehemiah Settle, MD   1 capsule at 03/04/13 1551  . albuterol (PROVENTIL HFA;VENTOLIN HFA) 108 (90 BASE) MCG/ACT inhaler 2 puff  2 puff Inhalation Q6H PRN Nehemiah Settle, MD   2 puff at 03/04/13 1552  . alum & mag hydroxide-simeth (MAALOX/MYLANTA) 200-200-20 MG/5ML suspension 30 mL  30 mL Oral Q4H PRN Audrea Muscat, NP   30 mL at 03/03/13 0256  . amLODipine (NORVASC) tablet 2.5 mg  2.5 mg Oral Daily Sanjuana Kava, NP   2.5 mg at 03/03/13 1108  . ARIPiprazole (ABILIFY) tablet 5 mg  5 mg Oral Q2000 Sanjuana Kava, NP   5 mg at 03/03/13 1947  . aspirin EC tablet 81 mg  81 mg Oral BH-q7a Evanna Cori Burkett, NP   81 mg at 03/04/13 1309  . atorvastatin (LIPITOR) tablet 40 mg  40 mg Oral Q supper Audrea Muscat, NP   40 mg at 03/03/13 1808  . B-complex with vitamin C tablet 1 tablet  1 tablet Oral Daily Nehemiah Settle, MD   1 tablet at 03/04/13 1309  . clonazePAM (KLONOPIN) tablet 0.5 mg  0.5 mg Oral TID PRN Rachael Fee, MD   0.5 mg at 03/04/13 1317  . clopidogrel (PLAVIX) tablet 75 mg  75 mg Oral Q supper Audrea Muscat, NP   75 mg at 03/03/13 1808  . DULoxetine (CYMBALTA) DR capsule 60 mg  60 mg Oral Daily Sanjuana Kava, NP   60 mg at 03/04/13 1309  . ibuprofen (ADVIL,MOTRIN) tablet 600 mg  600 mg Oral Q4H PRN Sanjuana Kava, NP      . levothyroxine (SYNTHROID, LEVOTHROID) tablet 50 mcg  50 mcg Oral QAC breakfast Audrea Muscat, NP   50 mcg at 03/04/13 [phone]  . loperamide (IMODIUM) capsule 4 mg  4 mg Oral PRN Nanine Means, NP   4 mg at 03/04/13 9604  . loratadine (CLARITIN) tablet 10 mg  10 mg Oral BH-q7a Evanna Janann August, NP   10 mg at 03/04/13 1309  . losartan (COZAAR) tablet 50 mg  50 mg Oral Daily Evanna Cori Merry Proud, NP   50 mg at 03/03/13 1107  . magnesium hydroxide (MILK OF MAGNESIA) suspension 30 mL  30 mL Oral Daily PRN Evanna Janann August, NP      . multivitamin with minerals tablet 1 tablet  1 tablet Oral BH-q7a Evanna Janann August, NP   1 tablet at 03/04/13 1309  . pantoprazole (PROTONIX) EC tablet 40 mg  40 mg Oral Daily Evanna Cori Merry Proud, NP   40 mg at 03/04/13 5409  . phenylephrine-shark liver oil-mineral oil-petrolatum (PREPARATION H) rectal ointment   Rectal BID PRN Nehemiah Settle, MD      . promethazine (PHENERGAN) tablet 25 mg  25 mg Oral Q4H PRN Nanine Means, NP   25 mg at 03/04/13 8119  . simethicone (MYLICON) chewable tablet 80 mg  80 mg Oral QID PRN Nehemiah Settle, MD   80 mg at 03/04/13 1551  . tiotropium (SPIRIVA) inhalation  capsule 18 mcg  18 mcg Inhalation BH-q7a Audrea Muscat, NP   18 mcg at 03/04/13 1309  . traMADol (ULTRAM) tablet 50 mg  50 mg Oral Q6H PRN Sanjuana Kava, NP   50 mg at 03/04/13 1317  . traZODone (DESYREL) tablet 300 mg  300 mg Oral QHS Audrea Muscat, NP   300 mg at 03/03/13 2128  Lab Results: No results found for this or any previous visit (from the past 48 hour(s)).  Physical Findings: AIMS: Facial and Oral Movements Muscles of Facial Expression: None, normal Lips and Perioral Area: None, normal Jaw: None, normal Tongue: None, normal,Extremity Movements Upper (arms, wrists, hands, fingers): None, normal Lower (legs, knees, ankles, toes): None, normal, Trunk Movements Neck, shoulders, hips: None, normal, Overall Severity Severity of abnormal movements (highest score from questions above): None, normal Incapacitation due to abnormal movements: None, normal Patient's awareness of abnormal movements (rate only patient's report): No Awareness, Dental Status Current problems with teeth and/or dentures?: Yes (cavities) Does patient usually wear dentures?: Yes  CIWA:  CIWA-Ar Total: 1 COWS:     Treatment Plan Summary: Daily contact with patient to assess and evaluate symptoms and progress in treatment Medication management  Plan: Supportive approach/coping skills/relapse prevention. Initiate Risaquad capsule for colitis. Obtain stool culture.  Due to frequent foul lose stools. Preparation H for hemorrhoids. Encouraged out of room, participation in group sessions and application of coping skills when distressed. Will continue to monitor response to/adverse effects of medications in use to assure effectiveness. Continue to monitor mood, behavior and interaction with staff and other patients. Continue current plan of care.  Medical Decision Making Problem Points:  Established problem, stable/improving (1) and Review of psycho-social stressors (1) Data Points:  Review of  medication regiment & side effects (2)  I certify that inpatient services furnished can reasonably be expected to improve the patient's condition.   [name] I, PMH-NP 03/04/2013, 4:33 PM

## 2013-03-04 NOTE — Progress Notes (Signed)
Patient did not attend the evening speaker AA meeting or wrap up group. Pt remained in bed with complaints of diarrhea.

## 2013-03-04 NOTE — [facility] Group Notes (Signed)
[facility] Group Notes:  (Nursing/MHT/Case Management/Adjunct)  Date:  03/04/2013  Time:  3:03 PM  Type of Therapy:  Psychoeducational Skills  Participation Level:  Did Not Attend  Kara Lamb 03/04/2013, 3:03 PM 

## 2013-03-04 NOTE — [facility] Group Notes (Signed)
[facility] Group Notes: (Clinical Social Work)   03/04/2013      Type of Therapy:  Group Therapy   Participation Level:  Did Not Attend (500 hall group, where patient programs)   Ambrose Mantle, LCSW 03/04/2013, 4:28 PM

## 2013-03-04 NOTE — [facility] Group Notes (Signed)
[facility] Group Notes: (Clinical Social Work)   03/04/2013      Type of Therapy:  Group Therapy   Participation Level:  Did Not Attend (programs on 500 hall)   Ambrose Mantle, LCSW 03/04/2013, 12:52 PM

## 2013-03-04 NOTE — Progress Notes (Signed)
Patient ID: Kara Lamb, female   DOB: February 09, 1956, 57 y.o.   MRN: [id number]  D: Pt continues to be very sick on the unit with episodes of diarrhea and N/V. Pt has been in the bed all day, and unable to engage in any treatment. Aggie NP made aware of situation, stool sample ordered and collected. Medication was also ordered and given to patient with some relief. Staff has continued to encourage patient to eat and drink plenty of fluids.  Pt reported being negative SI/HI, no AH/VH noted. A: 15 min checks continued for patient safety. R: Pts safety maintained.

## 2013-03-04 NOTE — Progress Notes (Signed)
Psychoeducational Group Note  Date: 03/03/13 Time:115  Group Topic/Focus:  Identifying Needs:   The focus of this group is to help patients identify their personal needs that have been historically problematic and identify healthy behaviors to address their needs.  Participation Level:  Did Not Attend  Participation Quality:    Affect:    Cognitive:    Insight:    Engagement in Group:    Additional Comments:    090/20/14 PD RN [facility]

## 2013-03-04 NOTE — Progress Notes (Signed)
[facility] Group Notes:  (Nursing/MHT/Case Management/Adjunct)  Date:  03/04/2013  Time:  6:36 PM  Type of Therapy:  Psychoeducational Skills  Participation Level:  Did Not Attend   Kara Lamb 03/04/2013, 6:36 PM

## 2013-03-05 MED ORDER — ATORVASTATIN CALCIUM 40 MG PO TABS
40.0000 mg | ORAL_TABLET | Freq: Every day | ORAL | Status: DC
  Administered 2013-03-05 – 2013-03-06 (×2): 40 mg via ORAL
  Filled 2013-03-05 (×4): qty 1

## 2013-03-05 MED ORDER — CLOPIDOGREL BISULFATE 75 MG PO TABS
75.0000 mg | ORAL_TABLET | Freq: Every day | ORAL | Status: DC
  Administered 2013-03-05 – 2013-03-06 (×2): 75 mg via ORAL
  Filled 2013-03-05 (×3): qty 1

## 2013-03-05 NOTE — Tx Team (Signed)
Interdisciplinary Treatment Plan Update (Adult)  Date: 03/05/2013  Time Reviewed:  9:45 AM  Progress in Treatment: Attending groups: Intermittently  Participating in groups:  Yes, when she attends.  Taking medication as prescribed:  Yes Tolerating medication:  Yes Family/Significant othe contact made: No, pt refused Patient understands diagnosis:  Yes Discussing patient identified problems/goals with staff:  Yes Medical problems stabilized or resolved:  Yes Denies suicidal/homicidal ideation: Yes Issues/concerns per patient self-inventory:  Yes Other:  New problem(s) identified: N/A  Discharge Plan or Barriers: Pt will follow up at [facility] for medication management and therapy.    Reason for Continuation of Hospitalization: Anxiety Depression Medication Stabilization  Comments: N/A  Estimated length of stay: 1-2 days   For review of initial/current patient goals, please see plan of care.  Attendees: Patient:     Family:     Physician:  Dr. Dub Mikes 03/05/2013 11:08 AM   Nursing:   Lupita Leash RN  03/05/2013 11:09 AM   Clinical Social Worker:  Trula Slade, LCSWA 03/05/2013 11:09 AM   Other: Onnie Boer, RN case manager 03/05/2013 11:09 AM   Other:  Sue Lush RN  03/05/2013 11:09 AM   Other:  Serena Colonel, NP 03/05/2013 11:09 AM   Other:  Onnie Boer, RN case manager 03/05/2013 11:09 AM   Other:    Other:    Other:    Other:    Other:    Other:     Scribe for Treatment Team:   Trula Slade, LCSWA, 03/05/2013 , 11:08 AM

## 2013-03-05 NOTE — Progress Notes (Signed)
Patient ID: Kara Lamb, female   DOB: [birth year], 57 y.o.   MRN: [id number] Patient ID: Kara Lamb [facility], female   DOB: December 25, 1955, 57 y.o.   MRN: [id number] [facility] MD Progress Note  03/05/2013 1:57 PM Kara Lamb  MRN:  [id number]  Subjective: Kara Lamb reports that she is still having a sough time. States that she was having diarrheal stools x 4 between 12:30 - 5:00 am today. She says that she feels weak and down. She rated her depression at #8. She wants to be left alone in her room. Denies any SIHI, AVH  Diagnosis:   DSM5: Depressive Disorders:  Major Depressive Disorder - Severe (296.23)  Axis I: Anxiety Disorder NOS and Major Depression, Recurrent severe Axis II: Cluster B Traits Axis III:  Past Medical History  Diagnosis Date  . CAD (coronary artery disease)     a. s/p BMS to RCA and LCX 2008; b. ISR of RCA rx'd with Xience DES 12/09; c. Requires extensive sedation for cath; d. cath 11/10: nonobs; e.Cath 2/12: nonobs; f. 05/2011 Cath: nonobs; g. 02/2013 nonobs, EF 60-65%.  . Chronic chest pain   . Anxiety and depression   . H/O: suicide attempt     drug overdose 3/08  . Hypertension   . Obesity   . Tobacco abuse   . H/O ETOH abuse   . Arthritis   . Hyperlipidemia   . Pneumonia   . UTI (lower urinary tract infection)   . Colitis   . GERD (gastroesophageal reflux disease)   . COPD (chronic obstructive pulmonary disease)   . Complication of anesthesia   . PONV (postoperative nausea and vomiting)   . Family history of anesthesia complication     TWIN SISTER also had problems waking    Axis IV: other psychosocial or environmental problems, problems related to social environment and problems with primary support group Axis V: 41-50 serious symptoms  ADL's:  Intact  Sleep: Poor  Appetite:  Poor  Suicidal Ideation:  Denies Homicidal Ideation:  Denies  Psychiatric Specialty Exam: Review of Systems  Constitutional: Negative.   HENT: Negative.   Eyes: Negative.    Respiratory: Negative.   Cardiovascular: Negative.   Gastrointestinal: Positive for nausea, vomiting and diarrhea.  Genitourinary: Negative.   Musculoskeletal: Negative.   Skin: Negative.   Neurological: Negative.   Endo/Heme/Allergies: Negative.   Psychiatric/Behavioral: Positive for depression and substance abuse. Negative for suicidal ideas and hallucinations. The patient is nervous/anxious and has insomnia (Currently being stabilized with medication).     Blood pressure 119/82, pulse 99, temperature 98 F (36.7 C), temperature source Oral, resp. rate 20, height 5\' 1"  (1.549 m), weight 89.359 kg (197 lb).Body mass index is 37.24 kg/(m^2).  General Appearance: Casual  Eye Contact::  Fair  Speech:  Normal Rate  Volume:  Normal  Mood:  Anxious and Depressed  Affect:  Congruent  Thought Process:  Coherent  Orientation:  Full (Time, Place, and Person)  Thought Content:  WDL  Suicidal Thoughts:  No  Homicidal Thoughts:  No  Memory:  Immediate;   Fair Recent;   Fair Remote;   Fair  Judgement:  Fair  Insight:  Fair  Psychomotor Activity:  Decreased  Concentration:  Fair  Recall:  Fair  Akathisia:  No  Handed:  Right  AIMS (if indicated):     Assets:  Physical Health Resilience  Sleep:  Number of Hours: 4   Current Medications: Current Facility-Administered Medications  Medication Dose Route Frequency Provider Last  Rate Last Dose  . acetaminophen (TYLENOL) tablet 650 mg  650 mg Oral Q6H PRN Audrea Muscat, NP   650 mg at 03/01/13 0637  . acidophilus (RISAQUAD) capsule 1 capsule  1 capsule Oral Daily Sanjuana Kava, NP   1 capsule at 03/05/13 0829  . albuterol (PROVENTIL HFA;VENTOLIN HFA) 108 (90 BASE) MCG/ACT inhaler 2 puff  2 puff Inhalation Q6H PRN Nehemiah Settle, MD   2 puff at 03/04/13 1552  . alum & mag hydroxide-simeth (MAALOX/MYLANTA) 200-200-20 MG/5ML suspension 30 mL  30 mL Oral Q4H PRN Audrea Muscat, NP   30 mL at 03/03/13 0256  . alum  hydroxide-mag trisilicate (GAVISCON) 80-20 MG per chewable tablet 2 tablet  2 tablet Oral TID AC & HS Court Joy, PA-C   2 tablet at 03/05/13 1201  . amLODipine (NORVASC) tablet 2.5 mg  2.5 mg Oral Daily Sanjuana Kava, NP   2.5 mg at 03/05/13 2130  . ARIPiprazole (ABILIFY) tablet 5 mg  5 mg Oral Q2000 Sanjuana Kava, NP   5 mg at 03/04/13 1949  . aspirin EC tablet 81 mg  81 mg Oral BH-q7a Evanna Janann August, NP   81 mg at 03/05/13 0829  . atorvastatin (LIPITOR) tablet 40 mg  40 mg Oral Q supper Audrea Muscat, NP   40 mg at 03/04/13 1823  . B-complex with vitamin C tablet 1 tablet  1 tablet Oral Daily Nehemiah Settle, MD   1 tablet at 03/05/13 0829  . clonazePAM (KLONOPIN) tablet 0.5 mg  0.5 mg Oral TID PRN Rachael Fee, MD   0.5 mg at 03/05/13 1159  . clopidogrel (PLAVIX) tablet 75 mg  75 mg Oral Q supper Audrea Muscat, NP   75 mg at 03/04/13 1823  . DULoxetine (CYMBALTA) DR capsule 60 mg  60 mg Oral Daily Sanjuana Kava, NP   60 mg at 03/05/13 0828  . ibuprofen (ADVIL,MOTRIN) tablet 600 mg  600 mg Oral Q4H PRN Sanjuana Kava, NP      . levothyroxine (SYNTHROID, LEVOTHROID) tablet 50 mcg  50 mcg Oral QAC breakfast Audrea Muscat, NP   50 mcg at 03/04/13 [phone]  . loperamide (IMODIUM) capsule 4 mg  4 mg Oral PRN Nanine Means, NP   4 mg at 03/05/13 1159  . loratadine (CLARITIN) tablet 10 mg  10 mg Oral BH-q7a Evanna Janann August, NP   10 mg at 03/05/13 8469  . losartan (COZAAR) tablet 50 mg  50 mg Oral Daily Evanna Cori Merry Proud, NP   50 mg at 03/05/13 0829  . magnesium hydroxide (MILK OF MAGNESIA) suspension 30 mL  30 mL Oral Daily PRN Evanna Janann August, NP      . multivitamin with minerals tablet 1 tablet  1 tablet Oral BH-q7a Evanna Janann August, NP   1 tablet at 03/05/13 0829  . pantoprazole (PROTONIX) EC tablet 40 mg  40 mg Oral BID Court Joy, PA-C   40 mg at 03/05/13 0520  . phenylephrine-shark liver oil-mineral oil-petrolatum (PREPARATION H) rectal ointment    Rectal BID PRN Nehemiah Settle, MD      . prochlorperazine (COMPAZINE) suppository 25 mg  25 mg Rectal Q12H PRN Court Joy, PA-C   25 mg at 03/04/13 2103  . promethazine (PHENERGAN) tablet 25 mg  25 mg Oral Q4H PRN Nanine Means, NP   25 mg at 03/05/13 0518  . simethicone (MYLICON) chewable tablet 80 mg  80 mg  Oral QID PRN Nehemiah Settle, MD   80 mg at 03/04/13 1949  . tiotropium (SPIRIVA) inhalation capsule 18 mcg  18 mcg Inhalation BH-q7a Audrea Muscat, NP   18 mcg at 03/05/13 0830  . traMADol (ULTRAM) tablet 50 mg  50 mg Oral Q6H PRN Sanjuana Kava, NP   50 mg at 03/05/13 1159  . traZODone (DESYREL) tablet 300 mg  300 mg Oral QHS Evanna Janann August, NP   300 mg at 03/04/13 2153    Lab Results:  Results for orders placed during the hospital encounter of 02/28/13 (from the past 48 hour(s))  STOOL CULTURE     Status: None   Collection Time    03/04/13  3:44 PM      Result Value Range   Specimen Description       Value: STOOL     Performed at [facility]   Special Requests       Value: NONE     Performed at [facility]   Culture       Value: Culture reincubated for better growth     Performed at [facility]   Report Status PENDING      Physical Findings: AIMS: Facial and Oral Movements Muscles of Facial Expression: None, normal Lips and Perioral Area: None, normal Jaw: None, normal Tongue: None, normal,Extremity Movements Upper (arms, wrists, hands, fingers): None, normal Lower (legs, knees, ankles, toes): None, normal, Trunk Movements Neck, shoulders, hips: None, normal, Overall Severity Severity of abnormal movements (highest score from questions above): None, normal Incapacitation due to abnormal movements: None, normal Patient's awareness of abnormal movements (rate only patient's report): No Awareness, Dental Status Current problems with teeth and/or dentures?: Yes (cavities) Does patient usually  wear dentures?: Yes  CIWA:  CIWA-Ar Total: 1 COWS:     Treatment Plan Summary: Daily contact with patient to assess and evaluate symptoms and progress in treatment Medication management  Plan: Supportive approach/coping skills/relapse prevention. Continue Risaquad capsule for colitis. Preparation H for hemorrhoids. Encouraged out of room, participation in group sessions and application of coping skills when distressed. Will continue to monitor response to/adverse effects of medications in use to assure effectiveness. Continue to monitor mood, behavior and interaction with staff and other patients. Continue current plan of care.  Medical Decision Making Problem Points:  Established problem, stable/improving (1) and Review of psycho-social stressors (1) Data Points:  Review of medication regiment & side effects (2)  I certify that inpatient services furnished can reasonably be expected to improve the patient's condition.   [name] I, PMH-NP 03/05/2013, 1:57 PM

## 2013-03-05 NOTE — Progress Notes (Signed)
Adult Psychoeducational Group Note  Date:  03/05/2013 Time:  1:25 PM  Group Topic/Focus:  Self Care:   The focus of this group is to help patients understand the importance of self-care in order to improve or restore emotional, physical, spiritual, interpersonal, and financial health.  Participation Level:  Did Not Attend  Cathlean Cower 03/05/2013, 1:25 PM

## 2013-03-05 NOTE — [facility] Group Notes (Signed)
[facility] LCSW Group Therapy  03/05/2013 3:04 PM  Type of Therapy:  Group Therapy  Participation Level:  Did Not Attend  Smart, Herbert Seta 03/05/2013, 3:04 PM

## 2013-03-05 NOTE — [facility] Group Notes (Signed)
[facility] LCSW Aftercare Discharge Planning Group Note   03/05/2013 9:41 AM  Participation Quality:  DID NOT ATTEND   Smart, Kara Lamb

## 2013-03-05 NOTE — Progress Notes (Signed)
D.  Pt pleasant on approach but not feeling well.  Has had continued nausea/vomiting and diarrhea with much gas.  Pt wonders if the vomiting and nausea could be attributed to the amount of gas she has been experiencing.  Denies SI/HI/hallucinations at this time. Pt was unable to attend evening AA group due to illness.  Interacting appropriately within milieu.  A.  PA notified of Pt's condition and orders received.  ECG ordered, performed, and transmitted with normal results.  Support and encouragement offered  R.  Pt remains safe in room, still not feeling well but hopeful for sleep tonight.  Will continue to monitor.

## 2013-03-05 NOTE — Progress Notes (Signed)
D:  Per pt self inventory pt reports sleeping poorly, appetite poor, energy level low, ability to pay attention poor, rates depression at a 9 out of 10 and hopelessness at an 8 out of 10, denies HI/AVH, endorses on and off passive SI, contracts for safety.  Pt is irritable, demanding and attention seeking, c/o occasional N/V/D.     A:  Emotional support provided, Encouraged pt to continue with treatment plan and attend all group activities, q15 min checks maintained for safety.  R:  Pt is refusing to go to groups until this afternoon pt agreed to go outside, pt has had 2 episodes on day shift of diarrhea, is still getting prn imodium, pt states that her diarrhea is improving, pt also has been refusing to go to the cafeteria and has been demanding special food.

## 2013-03-05 NOTE — Progress Notes (Signed)
Adult Psychoeducational Group Note  Date:  03/05/2013 Time:  8:42 PM  Group Topic/Focus:  AA meeting  Participation Level:  Active  Participation Quality:  Appropriate  Affect:  Appropriate  Cognitive:  Appropriate  Insight: Appropriate  Engagement in Group:  Developing/Improving  Modes of Intervention:  Pt attended group  Additional Comments:  Pt attended group   Sven Pinheiro 03/05/2013, 8:42 PM

## 2013-03-05 NOTE — Progress Notes (Signed)
D: Patient denies SI/HI/AVH. Patient rates hopelessness as 7,  depression as 7, and anxiety as 5.  Patient affect is depressed. Mood is depressed.  Pt states, "I'm beginning to get better.  Physically I'm getting better.  Today has been a pretty good day for me.  I started out rough, but it got better."  Patient did attend evening group. Patient visible on the milieu. No distress noted. A: Support and encouragement offered. Scheduled medications given to pt. Q 15 min checks continued for patient safety. R: Patient receptive. Patient remains safe on the unit.

## 2013-03-06 ENCOUNTER — Encounter: Payer: Self-pay | Admitting: Physician Assistant

## 2013-03-06 DIAGNOSIS — F39 Unspecified mood [affective] disorder: Secondary | ICD-10-CM

## 2013-03-06 MED ORDER — MULTI-VITAMIN/MINERALS PO TABS: 1.0000 | ORAL_TABLET | ORAL | Status: DC

## 2013-03-06 MED ORDER — TIOTROPIUM BROMIDE MONOHYDRATE 18 MCG IN CAPS: 18.0000 ug | ORAL_CAPSULE | RESPIRATORY_TRACT | Status: DC

## 2013-03-06 MED ORDER — ARIPIPRAZOLE 5 MG PO TABS: 5.0000 mg | ORAL_TABLET | Freq: Every day | ORAL | Status: DC

## 2013-03-06 MED ORDER — TRAZODONE HCL 300 MG PO TABS: 300.0000 mg | ORAL_TABLET | Freq: Every day | ORAL | Status: DC

## 2013-03-06 MED ORDER — ALBUTEROL SULFATE (2.5 MG/3ML) 0.083% IN NEBU: 2.5000 mg | INHALATION_SOLUTION | Freq: Four times a day (QID) | RESPIRATORY_TRACT | Status: DC | PRN

## 2013-03-06 MED ORDER — ALBUTEROL SULFATE HFA 108 (90 BASE) MCG/ACT IN AERS: 2.0000 | INHALATION_SPRAY | RESPIRATORY_TRACT | Status: DC | PRN

## 2013-03-06 MED ORDER — AMLODIPINE BESYLATE 2.5 MG PO TABS: 2.5000 mg | ORAL_TABLET | Freq: Every day | ORAL | Status: DC

## 2013-03-06 MED ORDER — CLOPIDOGREL BISULFATE 75 MG PO TABS: 75.0000 mg | ORAL_TABLET | Freq: Every day | ORAL | Status: DC

## 2013-03-06 MED ORDER — TRAMADOL HCL 50 MG PO TABS: 50.0000 mg | ORAL_TABLET | Freq: Four times a day (QID) | ORAL | Status: DC | PRN

## 2013-03-06 MED ORDER — LORATADINE 10 MG PO TABS: 10.0000 mg | ORAL_TABLET | ORAL | Status: DC

## 2013-03-06 MED ORDER — RISAQUAD PO CAPS: 1.0000 | ORAL_CAPSULE | Freq: Every day | ORAL | Status: DC

## 2013-03-06 MED ORDER — ESOMEPRAZOLE MAGNESIUM 40 MG PO CPDR: 40.0000 mg | DELAYED_RELEASE_CAPSULE | Freq: Every day | ORAL | Status: DC

## 2013-03-06 MED ORDER — BIOTIN 5 MG PO CAPS: 1.0000 | ORAL_CAPSULE | Freq: Every day | ORAL | Status: DC

## 2013-03-06 MED ORDER — ASPIRIN EC 81 MG PO TBEC: 81.0000 mg | DELAYED_RELEASE_TABLET | ORAL | Status: DC

## 2013-03-06 MED ORDER — DULOXETINE HCL 60 MG PO CPEP: 60.0000 mg | ORAL_CAPSULE | Freq: Every day | ORAL | Status: DC

## 2013-03-06 MED ORDER — ATORVASTATIN CALCIUM 40 MG PO TABS: 40.0000 mg | ORAL_TABLET | Freq: Every day | ORAL | Status: DC

## 2013-03-06 MED ORDER — NITROGLYCERIN 0.4 MG SL SUBL: 0.4000 mg | SUBLINGUAL_TABLET | SUBLINGUAL | Status: DC | PRN

## 2013-03-06 MED ORDER — SIMETHICONE 80 MG PO CHEW: 80.0000 mg | CHEWABLE_TABLET | Freq: Four times a day (QID) | ORAL | Status: DC | PRN

## 2013-03-06 MED ORDER — LEVOTHYROXINE SODIUM 50 MCG PO TABS: 50.0000 ug | ORAL_TABLET | Freq: Every day | ORAL | Status: DC

## 2013-03-06 MED ORDER — LOSARTAN POTASSIUM 50 MG PO TABS: 50.0000 mg | ORAL_TABLET | Freq: Every day | ORAL | Status: DC

## 2013-03-06 NOTE — Progress Notes (Signed)
Pt. Was asleep when writer arrived and assessed but did get up and request immodium for diarrhea.  BM's have become fewer.  Pt. Was given immodium, and gatorade.  Pt. Went back to sleep with no further episodes of diarrhea.

## 2013-03-06 NOTE — Progress Notes (Signed)
D: Patient denies SI/HI and A/V hallucinations; patient reports sleep is fair; reports appetite is good; reports energy level is low; reports ability to pay attention; rates depression as 5/10; rates hopelessness 3/10; rates anxiety as 7/10  A: Monitored q 15 minutes; patient encouraged to attend groups; patient educated about medications; patient given medications per physician orders; patient encouraged to express feelings and/or concerns  R: Patient is attention seeking but can be redirected; patient is cooperative and assertive ; patient's interaction with staff and peers is appropriate; patient is taking medications as prescribed and tolerating medications; patient is attending all groups and engaging

## 2013-03-06 NOTE — Progress Notes (Signed)
[facility] MD Progress Note  03/06/2013 4:46 PM Kara Lamb  MRN:  [id number] Subjective:  Kara Lamb states she is feeling much better now. She is not going back with her mother. She was able to get in touch with an old friend who is willing to help her out. States that she needs a lot of support to go trough everything she has to go trough as she pursues the divorce from her husband. She is planning to walk directly into [place] when she gets out to pursue the follow up. Diagnosis:   DSM5: Schizophrenia Disorders:   Obsessive-Compulsive Disorders:   Trauma-Stressor Disorders:   Substance/Addictive Disorders:   Depressive Disorders:  Major Depressive Disorder - Moderate (296.22)  Axis I: Mood Disorder NOS  ADL's:  Intact  Sleep: Fair  Appetite:  Fair  Suicidal Ideation:  Plan:  denies Intent:  denies Means:  denies Homicidal Ideation:  Plan:  denies Intent:  denies Means:  denies AEB (as evidenced by):  Psychiatric Specialty Exam: Review of Systems  Constitutional: Negative.   HENT: Negative.   Eyes: Negative.   Respiratory: Negative.   Cardiovascular: Negative.   Gastrointestinal: Negative.   Genitourinary: Negative.   Musculoskeletal: Negative.   Skin: Negative.   Neurological: Negative.   Endo/Heme/Allergies: Negative.   Psychiatric/Behavioral: Positive for depression. The patient is nervous/anxious.     Blood pressure 123/77, pulse 80, temperature 98 F (36.7 C), temperature source Oral, resp. rate 20, height 5\' 1"  (1.549 m), weight 89.359 kg (197 lb).Body mass index is 37.24 kg/(m^2).  General Appearance: Fairly Groomed  Patent attorney::  Fair  Speech:  Clear and Coherent  Volume:  Normal  Mood:  Anxious and worried  Affect:  Appropriate  Thought Process:  Coherent and Goal Directed  Orientation:  Full (Time, Place, and Person)  Thought Content:  deals with the most recent events, her plans when she gets out  Suicidal Thoughts:  No  Homicidal Thoughts:  No   Memory:  Immediate;   Fair Recent;   Fair Remote;   Fair  Judgement:  Fair  Insight:  Present  Psychomotor Activity:  Restlessness  Concentration:  Fair  Recall:  Fair  Akathisia:  No  Handed:    AIMS (if indicated):     Assets:  Desire for Improvement  Sleep:  Number of Hours: 5.75   Current Medications: Current Facility-Administered Medications  Medication Dose Route Frequency Provider Last Rate Last Dose  . acetaminophen (TYLENOL) tablet 650 mg  650 mg Oral Q6H PRN Audrea Muscat, NP   650 mg at 03/01/13 0637  . acidophilus (RISAQUAD) capsule 1 capsule  1 capsule Oral Daily Sanjuana Kava, NP   1 capsule at 03/06/13 0841  . albuterol (PROVENTIL HFA;VENTOLIN HFA) 108 (90 BASE) MCG/ACT inhaler 2 puff  2 puff Inhalation Q6H PRN Nehemiah Settle, MD   2 puff at 03/04/13 1552  . alum & mag hydroxide-simeth (MAALOX/MYLANTA) 200-200-20 MG/5ML suspension 30 mL  30 mL Oral Q4H PRN Audrea Muscat, NP   30 mL at 03/03/13 0256  . amLODipine (NORVASC) tablet 2.5 mg  2.5 mg Oral Daily Sanjuana Kava, NP   2.5 mg at 03/06/13 0841  . ARIPiprazole (ABILIFY) tablet 5 mg  5 mg Oral Q2000 Sanjuana Kava, NP   5 mg at 03/05/13 2122  . aspirin EC tablet 81 mg  81 mg Oral BH-q7a Evanna Janann August, NP   81 mg at 03/06/13 0841  . atorvastatin (LIPITOR) tablet 40 mg  40 mg Oral q1800 Nehemiah Settle, MD   40 mg at 03/05/13 1818  . B-complex with vitamin C tablet 1 tablet  1 tablet Oral Daily Nehemiah Settle, MD   1 tablet at 03/06/13 0841  . clonazePAM (KLONOPIN) tablet 0.5 mg  0.5 mg Oral TID PRN Rachael Fee, MD   0.5 mg at 03/06/13 1513  . clopidogrel (PLAVIX) tablet 75 mg  75 mg Oral q1800 Nehemiah Settle, MD   75 mg at 03/05/13 1818  . DULoxetine (CYMBALTA) DR capsule 60 mg  60 mg Oral Daily Sanjuana Kava, NP   60 mg at 03/06/13 0842  . ibuprofen (ADVIL,MOTRIN) tablet 600 mg  600 mg Oral Q4H PRN Sanjuana Kava, NP      . levothyroxine (SYNTHROID,  LEVOTHROID) tablet 50 mcg  50 mcg Oral QAC breakfast Audrea Muscat, NP   50 mcg at 03/04/13 [phone]  . loperamide (IMODIUM) capsule 4 mg  4 mg Oral PRN Nanine Means, NP   4 mg at 03/06/13 0031  . loratadine (CLARITIN) tablet 10 mg  10 mg Oral BH-q7a Evanna Janann August, NP   10 mg at 03/06/13 0842  . losartan (COZAAR) tablet 50 mg  50 mg Oral Daily Evanna Cori Merry Proud, NP   50 mg at 03/06/13 0842  . magnesium hydroxide (MILK OF MAGNESIA) suspension 30 mL  30 mL Oral Daily PRN Evanna Janann August, NP      . multivitamin with minerals tablet 1 tablet  1 tablet Oral BH-q7a Evanna Janann August, NP   1 tablet at 03/06/13 0841  . pantoprazole (PROTONIX) EC tablet 40 mg  40 mg Oral BID Court Joy, PA-C   40 mg at 03/06/13 1513  . phenylephrine-shark liver oil-mineral oil-petrolatum (PREPARATION H) rectal ointment   Rectal BID PRN Nehemiah Settle, MD      . prochlorperazine (COMPAZINE) suppository 25 mg  25 mg Rectal Q12H PRN Court Joy, PA-C   25 mg at 03/04/13 2103  . promethazine (PHENERGAN) tablet 25 mg  25 mg Oral Q4H PRN Nanine Means, NP   25 mg at 03/05/13 0518  . simethicone (MYLICON) chewable tablet 80 mg  80 mg Oral QID PRN Nehemiah Settle, MD   80 mg at 03/04/13 1949  . tiotropium (SPIRIVA) inhalation capsule 18 mcg  18 mcg Inhalation BH-q7a Audrea Muscat, NP   18 mcg at 03/06/13 [phone]  . traMADol (ULTRAM) tablet 50 mg  50 mg Oral Q6H PRN Sanjuana Kava, NP   50 mg at 03/05/13 1159  . traZODone (DESYREL) tablet 300 mg  300 mg Oral QHS Evanna Janann August, NP   300 mg at 03/05/13 2121    Lab Results: No results found for this or any previous visit (from the past 48 hour(s)).  Physical Findings: AIMS: Facial and Oral Movements Muscles of Facial Expression: None, normal Lips and Perioral Area: None, normal Jaw: None, normal Tongue: None, normal,Extremity Movements Upper (arms, wrists, hands, fingers): None, normal Lower (legs, knees, ankles, toes): None,  normal, Trunk Movements Neck, shoulders, hips: None, normal, Overall Severity Severity of abnormal movements (highest score from questions above): None, normal Incapacitation due to abnormal movements: None, normal Patient's awareness of abnormal movements (rate only patient's report): No Awareness, Dental Status Current problems with teeth and/or dentures?: No Does patient usually wear dentures?: No  CIWA:  CIWA-Ar Total: 1 COWS:     Treatment Plan Summary: Daily contact with patient to assess and evaluate  symptoms and progress in treatment Medication management  Plan: Supportive approach/coping skills           Continue to work CBT mindfulness, lifestyle changes to help better control the mood           disorder  Medical Decision Making Problem Points:  Review of psycho-social stressors (1) Data Points:  Review of medication regiment & side effects (2)  I certify that inpatient services furnished can reasonably be expected to improve the patient's condition.   Jezel Basto A 03/06/2013, 4:46 PM

## 2013-03-06 NOTE — Progress Notes (Signed)
Recreation Therapy Notes  Date: 09.23.2014 Time: 3:00pm Location: [address] Dayroom  Group Topic: Communication, Team Building, Problem Solving  Goal Area(s) Addresses:  Patient will effectively work with peer towards shared goal.  Patient will identify skill used to make activity successful.  Patient will identify how skills used during activity can be used to reach post d/c goals.   Behavioral Response: Appropriate, Attentive, engaged  Intervention: Problem Solving Activitiy  Activity: Life Boat. Patients were given a scenario about being on a sinking yacht. Patients were informed the yacht included 15 guest, 8 of which could be placed on the life boat, along with all group members. Individuals on guest list were of varying socioeconomic classes such as a Education officer, museum, Materials engineer, Midwife, Tree surgeon.   Education: Customer service manager, Discharge Planning   Education Outcome: Needs additional education  Clinical Observations/Feedback: Patient was in dayroom as group was beginning, after telling LRT her name patient stated she needed to "take care of something." Patient left group returning at approximately 3:15pm. Upon returning to group session patient actively engaged in group activity, voicing her opinion and debating with group members appropriately. Patient contributed to wrap up discussion identifying communication as a skill necessary to make activity successful. Patient related skills used during group session to her recovery process.   Kara Lamb, LRT/CTRS  Kara Lamb 03/06/2013 9:30 PM

## 2013-03-06 NOTE — Progress Notes (Signed)
Recreation Therapy Notes  Date: 09.23.2014 Time: 2:30pm Location: [address] Dayroom  Group Topic: Animal Assisted Activities (AAA)  Behavioral Response: Appropriate  Affect: Euthymic  Clinical Observations/Feedback: Dog Team: [employer]. Patient interacted appropriately with peer, dog team, LRT and MHT.   Marykay Lex Cleofas Hudgins, LRT/CTRS  Zevin Nevares L 03/06/2013 9:10 PM

## 2013-03-06 NOTE — Progress Notes (Signed)
Patient ID: Kara Lamb, female   DOB: 1955-08-21, 57 y.o.   MRN: [id number] PER STATE REGULATIONS 482.30  THIS CHART WAS REVIEWED FOR MEDICAL NECESSITY WITH RESPECT TO THE PATIENT'S ADMISSION/ DURATION OF STAY.  NEXT REVIEW DATE: 03/07/2013  Willa Rough, RN, BSN CASE MANAGER

## 2013-03-06 NOTE — Progress Notes (Signed)
(  Discharge early tomorrow morning) [facility] Adult Case Management Discharge Plan :  Will you be returning to the same living situation after discharge: Yes,  returning home At discharge, do you have transportation home?:Yes,  friend will pick pt up Do you have the ability to pay for your medications:Yes,  access to meds  Release of information consent forms completed and in the chart;  Patient's signature needed at discharge.  Patient to Follow up at: Follow-up Information   Follow up with [facility] On 03/07/2013. (Walk in on this date for hospital discharge appointment, for medication management and therapy.  Walk in clinic is Monday - Friday 8 am - 3 pm)    Contact information:   [address] [place], [place] [address] Phone: [phone] Fax: [phone]      Patient denies SI/HI:   Yes,  denies SI/HI    Safety Planning and Suicide Prevention discussed:  Yes,  discussed with pt.  Pt refused consent to for CSW to talk with family/friend.  See suicide prevention education note.   Carmina Miller 03/06/2013, 2:43 PM

## 2013-03-06 NOTE — [facility] Suicide Risk Assessment (Signed)
Suicide Risk Assessment  Discharge Assessment     Demographic Factors:  Caucasian  Mental Status Per Nursing Assessment::   On Admission:  Suicidal ideation indicated by patient;Suicide plan;Plan includes specific time, place, or method;Self-harm thoughts  Current Mental Status by Physician: In full contact with reality. There are no suicidal ideas, plans or intent. Her mood is euthymic, her affect is appropriate. She is going to continue outpatient treatment.    Loss Factors: Loss of significant relationship and Decline in physical health  Historical Factors: NA  Risk Reduction Factors:   Sense of responsibility to family and Living with another person, especially a relative  Continued Clinical Symptoms:  Depression:   Severe  Cognitive Features That Contribute To Risk:  Closed-mindedness Polarized thinking Thought constriction (tunnel vision)    Suicide Risk:  Minimal: No identifiable suicidal ideation.  Patients presenting with no risk factors but with morbid ruminations; may be classified as minimal risk based on the severity of the depressive symptoms  Discharge Diagnoses:   AXIS I:  Anxiety Disorder NOS and Major Depression, Recurrent severe AXIS II:  Deferred AXIS III:   Past Medical History  Diagnosis Date  . CAD (coronary artery disease)     a. s/p BMS to RCA and LCX 2008; b. ISR of RCA rx'd with Xience DES 12/09; c. Requires extensive sedation for cath; d. cath 11/10: nonobs; e.Cath 2/12: nonobs; f. 05/2011 Cath: nonobs; g. 02/2013 nonobs, EF 60-65%.  . Chronic chest pain   . Anxiety and depression   . H/O: suicide attempt     drug overdose 3/08  . Hypertension   . Obesity   . Tobacco abuse   . H/O ETOH abuse   . Arthritis   . Hyperlipidemia   . Pneumonia   . UTI (lower urinary tract infection)   . Colitis   . GERD (gastroesophageal reflux disease)   . COPD (chronic obstructive pulmonary disease)   . Complication of anesthesia   . PONV (postoperative  nausea and vomiting)   . Family history of anesthesia complication     TWIN SISTER also had problems waking    AXIS IV:  other psychosocial or environmental problems AXIS V:  61-70 mild symptoms  Plan Of Care/Follow-up recommendations:  Activity:  as tolerated Diet:  regular Follow up [facility] Is patient on multiple antipsychotic therapies at discharge:  No   Has Patient had three or more failed trials of antipsychotic monotherapy by history:  No  Recommended Plan for Multiple Antipsychotic Therapies: NA  Bayler Gehrig A 03/06/2013, 4:55 PM

## 2013-03-06 NOTE — [facility] Group Notes (Addendum)
[facility] LCSW Group Therapy  03/06/2013  1:15 PM   Type of Therapy:  Group Therapy  Participation Level:  Active  Participation Quality:  Appropriate and Attentive  Affect:  Appropriate  Cognitive:  Alert and Appropriate  Insight:  Developing/Improving and Engaged  Engagement in Therapy:  Developing/Improving and Engaged  Modes of Intervention:  Clarification, Confrontation, Discussion, Education, Exploration, Limit-setting, Orientation, Problem-solving, Rapport Building, Dance movement psychotherapist, Socialization and Support  Summary of Progress/Problems: The topic for group therapy was feelings about diagnosis.  Pt actively participated in group discussion on their past and current diagnosis and how they feel towards this.  Pt also identified how society and family members judge them, based on their diagnosis as well as stereotypes and stigmas.   Pt shared that she was diagnosed with bipolar disorder a couple years ago and didn't want to accept it then and was resentful.  Pt shared that through education and understanding her diagnosis she is now aware of her symptoms and feels it makes more sense.  Pt was able to process her marriage in January and how she felt she married in a manic episode.  Pt actively participated and was engaged in group discussion.    Kara Lamb, [place] 03/06/2013 2:32 PM
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
## 2013-03-06 NOTE — Progress Notes (Signed)
Pt. Was in the milieu laughing and interacting with her peers when Clinical research associate assessed. Pt. Had no complaints of pain or discomfort and reported a good day with the exception her Mother called and tried to get staff to keep her another day. Pt. States her Mother does not want her to come back to live in her home so patient does not have anywhere to go now when she is discharged tomorrow.    Pt. Later came to the med window for her HS medications and stated that her abd. Was distended and she was having pain.  Pt. Had been reporting diarrhea and vomiting the 3 days prior to today.  Writer encouraged the pt. To walk and drink fluids because she was also reporting flatulence.  Pt. Agreed then said she was going to vomit.  Pt. Stood at the window for 2 minutes asking if we had bags to throw up in like they had on med surg and stating that she was going to vomit.  Pt. Was about to take her trazodone and decided not to take it because she knew she would not get a repeat dose if she threw up.  Writer told the pt. To go rest for a few minutes and let me see it if she throws up.  Pt. Then started telling writer WHAT she was going to throw up.   Pt. Went to the room and later called the nurses station requesting a nurse.  Writer went in and assessed the emesis.  It was chunks of food. Pt. Said she felt better now and her stomach was no longer distended.  Pt. Went back to bed to rest for a while.  Writer was in the med room taking care of other patients and was told pt. Was in the med room stating that she needed oxygen or her albuterol.  The RN with the pt. ckd pt.'s 02 Stat. And it was 100%. Pt. Was given her albuterol and went back to her room.  Writer went into the pt.'s room and she was angry because her 02 stat was ckd and was 100%.  Stated she was not feeling the love.  Pt. Then said she was hurting up under her right breast so bad she could not catch her breath. Writer put some pressure on the area and pt. Stated it was  sore to the touch, "it feels like it is  Bruised".  Pt. Said she thought it might be her gallbladder and ask what the symptoms were.  Pt. Had expressed anxiety over not having a place to go so Writer offered pt. Something for her anxiety at this time. Pt. Accepted it.  Pt. Called the nurses station again later stating she had thrown up again and Clinical research associate assessed it. It was again food pt. Had eaten.  Pt. Stated it was bile and she was worried the pain was coming from her gallbladder. Pt. Now changed her mind about where the pain was located, "I have been feeling and pressing and trying to decide exactly where the pain is and I think it is in my upper right abdomen".  Writer assured the pt. That was not bile and this again made her angry.  Writer told the pt. That she was not being told that it was not her gallbladder but told her ways to monitor for gallbladder pain and what foods might irritate it.  Writer encouraged pt. To see her MD if she continues to have problems.   Pt. Accepted writers response  and accepted her trazodone   Pt. Is now resting quietly

## 2013-03-06 NOTE — Progress Notes (Signed)
Adult Psychoeducational Group Note  Date:  03/06/2013 Time:  1:21 PM  Group Topic/Focus:  Recovery Goals:   The focus of this group is to identify appropriate goals for recovery and establish a plan to achieve them.  Participation Level:  Did Not Attend  Kara Lamb 03/06/2013, 1:21 PM

## 2013-03-07 ENCOUNTER — Ambulatory Visit: Payer: Self-pay | Admitting: Physician Assistant

## 2013-03-07 DIAGNOSIS — F29 Unspecified psychosis not due to a substance or known physiological condition: Secondary | ICD-10-CM

## 2013-03-07 DIAGNOSIS — F329 Major depressive disorder, single episode, unspecified: Secondary | ICD-10-CM

## 2013-03-07 NOTE — Progress Notes (Signed)
Patient ID: Kara Lamb, female   DOB: 12/03/1955, 57 y.o.   MRN: [id number] Patient discharged per physician order; patient denies SI/HI and A/V hallucinations; patient received copy of AVS and prescriptions after it was reviewed with her by another nurse Pam RN; patient had no other questions or concerns at this time; patient verbalized and signed that she received all belongings; patient left the unit ambulatory with the security to take her to her car

## 2013-03-08 LAB — STOOL CULTURE

## 2013-03-12 ENCOUNTER — Ambulatory Visit (INDEPENDENT_AMBULATORY_CARE_PROVIDER_SITE_OTHER): Payer: BC Managed Care – PPO | Admitting: Physician Assistant

## 2013-03-12 ENCOUNTER — Encounter: Payer: Self-pay | Admitting: Physician Assistant

## 2013-03-12 VITALS — BP 144/78 | HR 73 | Ht 61.0 in | Wt 193.8 lb

## 2013-03-12 DIAGNOSIS — I2510 Atherosclerotic heart disease of native coronary artery without angina pectoris: Secondary | ICD-10-CM

## 2013-03-12 DIAGNOSIS — I10 Essential (primary) hypertension: Secondary | ICD-10-CM

## 2013-03-12 DIAGNOSIS — E785 Hyperlipidemia, unspecified: Secondary | ICD-10-CM

## 2013-03-12 MED ORDER — AMLODIPINE BESYLATE 2.5 MG PO TABS: 2.5000 mg | ORAL_TABLET | Freq: Every day | ORAL | Status: DC

## 2013-03-12 NOTE — Progress Notes (Signed)
Patient Discharge Instructions:  After Visit Summary (AVS):   Faxed to:  03/12/13 Psychiatric Admission Assessment Note:   Faxed to:  03/12/13 Suicide Risk Assessment - Discharge Assessment:   Faxed to:  03/12/13 Faxed/Sent to the Next Level Care provider:  03/12/13 Faxed to [facility] @ [phone]  Jerelene Redden, 03/12/2013, 3:11 PM

## 2013-03-12 NOTE — Patient Instructions (Addendum)
Your physician wants you to follow-up in: 6 MONTHS WITH DR. HOCHREIN. You will receive a reminder letter in the mail two months in advance. If you don't receive a letter, please call our office to schedule the follow-up appointment.   NO CHANGES WERE MADE TODAY 

## 2013-03-12 NOTE — Progress Notes (Signed)
[address], [address] [place], [place]  [address] Phone: [phone] Fax:  [phone]  Date:  03/12/2013   ID:  Kara Lamb, [place] [birth year], MRN [id number]  PCP:   Dr. Gwendlyn [facility])  Cardiologist:  Dr. Arvilla Meres => Dr. Verne Carrow => Dr. Rollene Rotunda     History of Present Illness: Kara Lamb is a 57 y.o. female who returns for evaluation of CP.  She has a hx of CAD, COPD and chronic chest pain. She's had previous stenting of her RCA and CFX c/b ISR of her RCA back in 2009. At that time, she underwent DES to her RCA. She also has a h/o HTN, HL, obesity, reflex sympathetic dystrophy and suicidal depression.  Myoview 9/12:  No scar or ischemia, EF 70%.  LHC in 05/2011 with patent stent in RCA with 60% ISR (FFR 0.98 - not flow limiting) and non-obstructive disease elsewhere. She has had Barrett's esophagus demonstrated on EGD in the past.  Last seen by Dr. Verne Carrow in 06/2011.   She moved to [place] in 2013.  She was followed by a cardiologist there (Dr. Darl Pikes).  She reports one heart cath there in 2013.  She did not require PCI.  She recently moved back to this area. I saw her in follow up on 02/21/13. She reported frequent chest pain over the last year that had worsened over the last few weeks. We admitted her to the hospital and proceeded with cardiac catheterization.  She ruled out for myocardial infarction by enzymes. LHC (02/22/13): mid OM1 stent patent with 20-30% proximal to the OM stent, mid RCA stent patent with 50-60% ISR, EF 60-65%. Continued medical therapy was recommended.  She is doing better.  She notes less chest pain.  Breathing is stable.  She denies syncope.  She does note that she was admitted recently for depression and overall now feels better.  Labs (1/13):  K 3.3, Cr 0.8, ALT 26, HDL 41, LDL 70, Hgb 13  Labs (2/13):  K 4, Cr 0.8, TSH 20.7  Labs (9/14):  K 3.8, Cr 0.58, ALT 21, HDL 51, LDL 71, Hgb  14.1, TSH 2.570=>10.458  Wt Readings from Last 3 Encounters:  03/01/13 197 lb (89.359 kg)  02/27/13 199 lb (90.266 kg)  02/22/13 198 lb 1.6 oz (89.858 kg)     Past Medical History  Diagnosis Date  . CAD (coronary artery disease)     a. s/p BMS to RCA and LCX 2008; b. ISR of RCA rx'd with Xience DES 12/09; c. Requires extensive sedation for cath; d. cath 11/10: nonobs; e.Cath 2/12: nonobs; f. 05/2011 Cath: nonobs; g. 02/2013 nonobs, EF 60-65%.  . Chronic chest pain   . Anxiety and depression   . H/O: suicide attempt     drug overdose 3/08  . Hypertension   . Obesity   . Tobacco abuse   . H/O ETOH abuse   . Arthritis   . Hyperlipidemia   . Pneumonia   . UTI (lower urinary tract infection)   . Colitis   . GERD (gastroesophageal reflux disease)   . COPD (chronic obstructive pulmonary disease)   . Complication of anesthesia   . PONV (postoperative nausea and vomiting)   . Family history of anesthesia complication     TWIN SISTER also had problems waking     Current Outpatient Prescriptions  Medication Sig Dispense Refill  . acidophilus (RISAQUAD) CAPS capsule Take 1 capsule by mouth daily. For  colitis  30 capsule  0  . albuterol (PROVENTIL HFA;VENTOLIN HFA) 108 (90 BASE) MCG/ACT inhaler Inhale 2 puffs into the lungs every 4 (four) hours as needed for wheezing or shortness of breath.      Marland Kitchen albuterol (PROVENTIL) (2.5 MG/3ML) 0.083% nebulizer solution Take 3 mLs (2.5 mg total) by nebulization every 6 (six) hours as needed for wheezing or shortness of breath.  140 mL  6  . amLODipine (NORVASC) 2.5 MG tablet Take 1 tablet (2.5 mg total) by mouth daily. For hypertension  30 tablet  11  . ARIPiprazole (ABILIFY) 5 MG tablet Take 1 tablet (5 mg total) by mouth daily at 8 pm. For mood control  30 tablet  0  . aspirin EC 81 MG tablet Take 1 tablet (81 mg total) by mouth every morning. For heart health      . atorvastatin (LIPITOR) 40 MG tablet Take 1 tablet (40 mg total) by mouth daily with  supper. For high cholesterol control      . Biotin (SUPER BIOTIN) 5 MG CAPS Take 1 capsule (5 mg total) by mouth daily. B-vitamin supplement    0  . clopidogrel (PLAVIX) 75 MG tablet Take 1 tablet (75 mg total) by mouth daily with supper. For heart condition      . DULoxetine (CYMBALTA) 60 MG capsule Take 1 capsule (60 mg total) by mouth daily. For depression  30 capsule  0  . esomeprazole (NEXIUM) 40 MG capsule Take 1 capsule (40 mg total) by mouth daily before breakfast. For acid reflux      . levothyroxine (SYNTHROID, LEVOTHROID) 50 MCG tablet Take 1 tablet (50 mcg total) by mouth daily before breakfast. For low thyroid hormone      . loratadine (CLARITIN) 10 MG tablet Take 1 tablet (10 mg total) by mouth every morning. For allergies      . losartan (COZAAR) 50 MG tablet Take 1 tablet (50 mg total) by mouth daily. For hypertension      . Multiple Vitamins-Minerals (MULTIVITAMIN WITH MINERALS) tablet Take 1 tablet by mouth every morning. For low vitamin      . nitroGLYCERIN (NITROSTAT) 0.4 MG SL tablet Place 1 tablet (0.4 mg total) under the tongue every 5 (five) minutes as needed. For chest pain  25 tablet  12  . simethicone (MYLICON) 80 MG chewable tablet Chew 1 tablet (80 mg total) by mouth 4 (four) times daily as needed for flatulence.  120 tablet  0  . tiotropium (SPIRIVA) 18 MCG inhalation capsule Place 1 capsule (18 mcg total) into inhaler and inhale every morning. For COPD  30 capsule  12  . traMADol (ULTRAM) 50 MG tablet Take 1 tablet (50 mg total) by mouth every 6 (six) hours as needed. For pain  30 tablet  0  . traZODone (DESYREL) 300 MG tablet Take 1 tablet (300 mg total) by mouth at bedtime. For sleep  30 tablet  0   No current facility-administered medications for this visit.    Allergies:    Allergies  Allergen Reactions  . Prednisone     psychosis  . Imdur [Isosorbide]     Low blood pressure  . Nitroglycerin     Patches--headaches  . Zoloft [Sertraline Hcl]     Severe  diarrhea    Social History:  The patient  reports that she quit smoking about 3 years ago. Her smoking use included Cigarettes. She has a 35 pack-year smoking history. She has never used smokeless tobacco. She reports  that she does not drink alcohol or use illicit drugs.    ROS:  Please see the history of present illness.    All other systems reviewed and negative.   PHYSICAL EXAM: VS:  BP 144/78  Pulse 73  Ht 5\' 1"  (1.549 m)  Wt 193 lb 12.8 oz (87.907 kg)  BMI 36.64 kg/m2 Well nourished, well developed, in no acute distress HEENT: normal Neck: no JVD Cardiac:  normal S1, S2; RRR; no murmur Lungs:  clear to auscultation bilaterally, no wheezing, rhonchi or rales Abd: soft, nontender, no hepatomegaly Ext: no edema. right wrist without hematoma or mass  Skin: warm and dry Neuro:  CNs 2-12 intact, no focal abnormalities noted  EKG:   NSR, HR 73, no acute changes     ASSESSMENT AND PLAN:  1. CAD:  She has chronic CP that is overall stable to improved.  She may have microvascular ischemia.  CP may be related to fibromyalgia or possibly GI.  She has been intolerant to Nitrates.  She had no benefit to Ranexa.  She seems to be better on amlodipine.  She can continue this as well as ASA, Plavix, statin. 2. Hypertension:  Controlled.  Continue current therapy.  3. Hyperlipidemia:  Continue statin. 4. Hypothyroidism:  F/u with PCP. 5. Disposition:  F/u with Dr. Rollene Rotunda in 6 mos.  Signed, Tereso Newcomer, PA-C  03/12/2013 12:34 PM

## 2013-03-14 ENCOUNTER — Encounter: Payer: Self-pay | Admitting: Physician Assistant

## 2013-03-22 ENCOUNTER — Telehealth: Payer: Self-pay | Admitting: Critical Care Medicine

## 2013-03-22 NOTE — Telephone Encounter (Signed)
LM for [facility] to call back for ONO results

## 2013-03-23 NOTE — Telephone Encounter (Signed)
Noted  

## 2013-03-23 NOTE — Telephone Encounter (Signed)
We have the ONO results.  They are in PW's to do.  Pt aware he will not be back in office until Monday and ok with this.  Will route msg to PW to advise on Monday.  ** Pt states she is leaving to go out of town and will not be back until Nov 3.  FYI.

## 2013-03-23 NOTE — Telephone Encounter (Signed)
Spoke with pt and notified of results per Dr. Delford Field. Pt verbalized understanding and denied any questions. She states that she wants [place] to hold off on ordering this for now since she is going out of town and then when returns will most likely be moving and does not know her new address yet She states that she will call us back once she is situated and then we can order Will forward to PW so he is aware

## 2013-03-23 NOTE — Telephone Encounter (Signed)
ONO shows desaturation.  She would benefit from oxygen 2Liters QHS  Can order from [facility]

## 2013-03-26 ENCOUNTER — Encounter: Payer: Self-pay | Admitting: *Deleted

## 2013-03-27 ENCOUNTER — Telehealth: Payer: Self-pay | Admitting: Critical Care Medicine

## 2013-03-27 DIAGNOSIS — J439 Emphysema, unspecified: Secondary | ICD-10-CM

## 2013-03-27 NOTE — Telephone Encounter (Signed)
Faxed insurance card as requested

## 2013-03-27 NOTE — Telephone Encounter (Signed)
Per Dr. Delford Field: ONO shows desaturation. She would benefit from oxygen 2Liters QHS  Can order from [facility]  At the time the results were given the pt asked to hold off on order because she was going to be moving but pt states this has been postponed and wants order placed. Done. Carron Curie, CMA

## 2013-04-03 ENCOUNTER — Ambulatory Visit: Payer: Self-pay | Admitting: Cardiology

## 2013-05-04 ENCOUNTER — Other Ambulatory Visit: Payer: Self-pay | Admitting: *Deleted

## 2013-05-04 MED ORDER — CLOPIDOGREL BISULFATE 75 MG PO TABS: 75.0000 mg | ORAL_TABLET | Freq: Every day | ORAL | Status: DC

## 2013-05-04 MED ORDER — LOSARTAN POTASSIUM 50 MG PO TABS: 50.0000 mg | ORAL_TABLET | Freq: Every day | ORAL | Status: DC

## 2013-05-04 MED ORDER — ATORVASTATIN CALCIUM 40 MG PO TABS: 40.0000 mg | ORAL_TABLET | Freq: Every day | ORAL | Status: DC

## 2013-05-21 ENCOUNTER — Encounter: Payer: Self-pay | Admitting: Internal Medicine

## 2013-05-21 NOTE — Progress Notes (Signed)
Patient has transferred GI care from [facility] to [facility] (phone [phone]).

## 2013-05-28 ENCOUNTER — Ambulatory Visit: Payer: Self-pay | Admitting: Critical Care Medicine

## 2013-06-21 ENCOUNTER — Other Ambulatory Visit: Payer: Self-pay | Admitting: *Deleted

## 2013-06-21 MED ORDER — NITROGLYCERIN 0.4 MG SL SUBL: 0.4000 mg | SUBLINGUAL_TABLET | SUBLINGUAL | Status: AC | PRN

## 2013-07-05 ENCOUNTER — Ambulatory Visit (INDEPENDENT_AMBULATORY_CARE_PROVIDER_SITE_OTHER): Payer: BC Managed Care – PPO | Admitting: Critical Care Medicine

## 2013-07-05 ENCOUNTER — Encounter: Payer: Self-pay | Admitting: Critical Care Medicine

## 2013-07-05 VITALS — BP 128/80 | HR 82 | Temp 97.7°F | Ht 61.0 in | Wt 206.0 lb

## 2013-07-05 DIAGNOSIS — J449 Chronic obstructive pulmonary disease, unspecified: Secondary | ICD-10-CM | POA: Insufficient documentation

## 2013-07-05 DIAGNOSIS — J438 Other emphysema: Secondary | ICD-10-CM

## 2013-07-05 DIAGNOSIS — J4520 Mild intermittent asthma, uncomplicated: Secondary | ICD-10-CM

## 2013-07-05 DIAGNOSIS — J45909 Unspecified asthma, uncomplicated: Secondary | ICD-10-CM

## 2013-07-05 DIAGNOSIS — J439 Emphysema, unspecified: Secondary | ICD-10-CM

## 2013-07-05 DIAGNOSIS — J4489 Other specified chronic obstructive pulmonary disease: Secondary | ICD-10-CM | POA: Insufficient documentation

## 2013-07-05 LAB — PULMONARY FUNCTION TEST
DL/VA % pred: 99 %
DL/VA: 4.24 ml/min/mmHg/L
DLCO unc % pred: 97 %
DLCO unc: 18.32 ml/min/mmHg
FEF 25-75 Post: 3.25 L/sec
FEF 25-75 Pre: 2.39 L/sec
FEF2575-%CHANGE-POST: 35 %
FEF2575-%Pred-Post: 145 %
FEF2575-%Pred-Pre: 106 %
FEV1-%Change-Post: 7 %
FEV1-%PRED-POST: 101 %
FEV1-%Pred-Pre: 94 %
FEV1-POST: 2.3 L
FEV1-Pre: 2.13 L
FEV1FVC-%Change-Post: 2 %
FEV1FVC-%PRED-PRE: 103 %
FEV6-%Change-Post: 4 %
FEV6-%Pred-Post: 97 %
FEV6-%Pred-Pre: 93 %
FEV6-PRE: 2.61 L
FEV6-Post: 2.74 L
FEV6FVC-%PRED-PRE: 103 %
FEV6FVC-%Pred-Post: 103 %
FVC-%Change-Post: 4 %
FVC-%Pred-Post: 94 %
FVC-%Pred-Pre: 90 %
FVC-POST: 2.74 L
FVC-Pre: 2.61 L
POST FEV1/FVC RATIO: 84 %
Post FEV6/FVC ratio: 100 %
Pre FEV1/FVC ratio: 82 %
Pre FEV6/FVC Ratio: 100 %
RV % pred: 96 %
RV: 1.68 L
TLC % pred: 100 %
TLC: 4.48 L

## 2013-07-05 MED ORDER — ALBUTEROL SULFATE HFA 108 (90 BASE) MCG/ACT IN AERS: 2.0000 | INHALATION_SPRAY | RESPIRATORY_TRACT | Status: DC | PRN

## 2013-07-05 NOTE — Progress Notes (Signed)
PFT done. Jennifer Castillo, CMA  

## 2013-07-05 NOTE — Progress Notes (Signed)
Subjective:    Patient ID: Kara Lamb, female    DOB: [birth year], 58 y.o.   MRN: [id number]  HPI   07/06/2013 Chief Complaint  Patient presents with  . Follow-up    with PFTs.  Has prod cough with yellowish green mucus, wheezing, and some chest tightness.  No f/c/s.   More congestion since 04/2013. Cough is prod of yellow/green  Also more dyspnea.  More diff time in the cold air Spiriva does not help   CAT Score 07/05/2013 02/27/2013  Total CAT Score 16 20       Past Medical History  Diagnosis Date  . CAD (coronary artery disease)     a. s/p BMS to RCA and LCX 2008; b. ISR of RCA rx'd with Xience DES 12/09; c. Requires extensive sedation for cath; d. cath 11/10: nonobs; e.Cath 2/12: nonobs; f. 05/2011 Cath: nonobs; g. 02/2013 nonobs, EF 60-65%.  . Chronic chest pain   . Anxiety and depression   . H/O: suicide attempt     drug overdose 3/08  . Hypertension   . Obesity   . Tobacco abuse   . H/O ETOH abuse   . Arthritis   . Hyperlipidemia   . Colitis   . GERD (gastroesophageal reflux disease)   . COPD (chronic obstructive pulmonary disease)   . Complication of anesthesia      Family History  Problem Relation Age of Onset  . Coronary artery disease Mother   . Emphysema Mother   . Colon cancer Father   . Prostate cancer Father      History   Social History  . Marital Status: Divorced    Spouse Name: N/A    Number of Children: 3  . Years of Education: N/A   Occupational History  .      Workmen's comp   Social History Main Topics  . Smoking status: Former Smoker -- 1.00 packs/day for 35 years    Types: Cigarettes    Quit date: 02/23/2010  . Smokeless tobacco: Never Used  . Alcohol Use: No     Comment: rarely  . Drug Use: No  . Sexual Activity: Not Currently   Other Topics Concern  . Not on file   Social History Narrative  . No narrative on file     Allergies  Allergen Reactions  . Prednisone     psychosis  . Imdur [Isosorbide]     Low  blood pressure  . Nitroglycerin     Patches--headaches  . Zoloft [Sertraline Hcl]     Severe diarrhea     Outpatient Prescriptions Prior to Visit  Medication Sig Dispense Refill  . albuterol (PROVENTIL) (2.5 MG/3ML) 0.083% nebulizer solution Take 3 mLs (2.5 mg total) by nebulization every 6 (six) hours as needed for wheezing or shortness of breath.  140 mL  6  . amLODipine (NORVASC) 2.5 MG tablet Take 1 tablet (2.5 mg total) by mouth daily. For hypertension  30 tablet  11  . ARIPiprazole (ABILIFY) 5 MG tablet Take 1 tablet (5 mg total) by mouth daily at 8 pm. For mood control  30 tablet  0  . aspirin EC 81 MG tablet Take 1 tablet (81 mg total) by mouth every morning. For heart health      . atorvastatin (LIPITOR) 40 MG tablet Take 1 tablet (40 mg total) by mouth daily with supper. For high cholesterol control  30 tablet  3  . clopidogrel (PLAVIX) 75 MG tablet Take 1 tablet (75 mg  total) by mouth daily with supper. For heart condition  30 tablet  3  . DULoxetine (CYMBALTA) 60 MG capsule Take 1 capsule (60 mg total) by mouth daily. For depression  30 capsule  0  . esomeprazole (NEXIUM) 40 MG capsule Take 40 mg by mouth 2 (two) times daily before a meal. For acid reflux      . levothyroxine (SYNTHROID, LEVOTHROID) 50 MCG tablet Take 1 tablet (50 mcg total) by mouth daily before breakfast. For low thyroid hormone      . losartan (COZAAR) 50 MG tablet Take 1 tablet (50 mg total) by mouth daily. For hypertension  30 tablet  3  . Multiple Vitamins-Minerals (MULTIVITAMIN WITH MINERALS) tablet Take 1 tablet by mouth every morning. For low vitamin      . nitroGLYCERIN (NITROSTAT) 0.4 MG SL tablet Place 1 tablet (0.4 mg total) under the tongue every 5 (five) minutes as needed for chest pain.  25 tablet  3  . traZODone (DESYREL) 300 MG tablet Take 1 tablet (300 mg total) by mouth at bedtime. For sleep  30 tablet  0  . albuterol (PROVENTIL HFA;VENTOLIN HFA) 108 (90 BASE) MCG/ACT inhaler Inhale 2 puffs into  the lungs every 4 (four) hours as needed for wheezing or shortness of breath.      . tiotropium (SPIRIVA) 18 MCG inhalation capsule Place 1 capsule (18 mcg total) into inhaler and inhale every morning. For COPD  30 capsule  12  . acidophilus (RISAQUAD) CAPS capsule Take 1 capsule by mouth daily. For colitis  30 capsule  0  . Biotin (SUPER BIOTIN) 5 MG CAPS Take 1 capsule (5 mg total) by mouth daily. B-vitamin supplement    0  . loratadine (CLARITIN) 10 MG tablet Take 1 tablet (10 mg total) by mouth every morning. For allergies       No facility-administered medications prior to visit.     Review of Systems  Constitutional:   No  weight loss, night sweats,  Fevers, chills, + fatigue, or  lassitude.  HEENT:   No headaches,  Difficulty swallowing,  Tooth/dental problems, or  Sore throat,                No sneezing, itching, ear ache, nasal congestion, +post nasal drip,   CV:  No chest pain,  Orthopnea, PND, swelling in lower extremities, anasarca, dizziness, palpitations, syncope.   GI  No heartburn, indigestion, abdominal pain, nausea, vomiting, diarrhea, change in bowel habits, loss of appetite, bloody stools.   Resp:    No coughing up of blood.    No chest wall deformity  Skin: no rash or lesions.  GU: no dysuria, change in color of urine, no urgency or frequency.  No flank pain, no hematuria   MS:  No joint pain or swelling.  No decreased range of motion.  No back pain.  Psych:  No change in mood or affect. No depression or anxiety.  No memory loss.         Objective:   Physical Exam BP 128/80  Pulse 82  Temp(Src) 97.7 F (36.5 C) (Oral)  Ht 5\' 1"  (1.549 m)  Wt 206 lb (93.441 kg)  BMI 38.94 kg/m2  SpO2 97%  GEN: A/Ox3; pleasant , NAD, well nourished   HEENT:  /AT,  EACs-clear, TMs-wnl, NOSE-clear, THROAT-clear, no lesions, no postnasal drip or exudate noted.   NECK:  Supple w/ fair ROM; no JVD; normal carotid impulses w/o bruits; no thyromegaly or nodules  palpated; no  lymphadenopathy.  RESP  Coarse BS  w/o, wheezes/ rales/ or rhonchi.no accessory muscle use, no dullness to percussion  CARD:  RRR, no m/r/g  , no peripheral edema, pulses intact, no cyanosis or clubbing.  GI:   Soft & nt; nml bowel sounds; no organomegaly or masses detected.  Musco: Warm bil, no deformities or joint swelling noted.   Neuro: alert, no focal deficits noted.    Skin: Warm, no lesions or rashes   Pulmonary function studies on 07/05/2013 are completely normal with no abnormalities in lung volumes spirometry or diffusion     Assessment & Plan:   Asthma, mild intermittent Mild intermittent asthma  no evidence of COPD on pulmonary function testing Plan Continue as needed albuterol Discontinue further maintenance inhalers    Updated Medication List Outpatient Encounter Prescriptions as of 07/05/2013  Medication Sig  . albuterol (PROVENTIL HFA;VENTOLIN HFA) 108 (90 BASE) MCG/ACT inhaler Inhale 2 puffs into the lungs every 4 (four) hours as needed for wheezing or shortness of breath.  Marland Kitchen albuterol (PROVENTIL) (2.5 MG/3ML) 0.083% nebulizer solution Take 3 mLs (2.5 mg total) by nebulization every 6 (six) hours as needed for wheezing or shortness of breath.  Marland Kitchen aluminum-magnesium hydroxide-simethicone (MAALOX) [phone] MG/5ML SUSP Take by mouth every 4 (four) hours as needed.  Marland Kitchen amLODipine (NORVASC) 2.5 MG tablet Take 1 tablet (2.5 mg total) by mouth daily. For hypertension  . ARIPiprazole (ABILIFY) 5 MG tablet Take 1 tablet (5 mg total) by mouth daily at 8 pm. For mood control  . aspirin EC 81 MG tablet Take 1 tablet (81 mg total) by mouth every morning. For heart health  . atorvastatin (LIPITOR) 40 MG tablet Take 1 tablet (40 mg total) by mouth daily with supper. For high cholesterol control  . clopidogrel (PLAVIX) 75 MG tablet Take 1 tablet (75 mg total) by mouth daily with supper. For heart condition  . dicyclomine (BENTYL) 10 MG capsule Take 10 mg by mouth  every 6 (six) hours as needed for spasms.  . DULoxetine (CYMBALTA) 60 MG capsule Take 1 capsule (60 mg total) by mouth daily. For depression  . esomeprazole (NEXIUM) 40 MG capsule Take 40 mg by mouth 2 (two) times daily before a meal. For acid reflux  . levothyroxine (SYNTHROID, LEVOTHROID) 50 MCG tablet Take 1 tablet (50 mcg total) by mouth daily before breakfast. For low thyroid hormone  . losartan (COZAAR) 50 MG tablet Take 1 tablet (50 mg total) by mouth daily. For hypertension  . Multiple Vitamins-Minerals (MULTIVITAMIN WITH MINERALS) tablet Take 1 tablet by mouth every morning. For low vitamin  . nitroGLYCERIN (NITROSTAT) 0.4 MG SL tablet Place 1 tablet (0.4 mg total) under the tongue every 5 (five) minutes as needed for chest pain.  . traZODone (DESYREL) 300 MG tablet Take 1 tablet (300 mg total) by mouth at bedtime. For sleep  . [DISCONTINUED] albuterol (PROVENTIL HFA;VENTOLIN HFA) 108 (90 BASE) MCG/ACT inhaler Inhale 2 puffs into the lungs every 4 (four) hours as needed for wheezing or shortness of breath.  . [DISCONTINUED] tiotropium (SPIRIVA) 18 MCG inhalation capsule Place 1 capsule (18 mcg total) into inhaler and inhale every morning. For COPD  . [DISCONTINUED] acidophilus (RISAQUAD) CAPS capsule Take 1 capsule by mouth daily. For colitis  . [DISCONTINUED] Biotin (SUPER BIOTIN) 5 MG CAPS Take 1 capsule (5 mg total) by mouth daily. B-vitamin supplement  . [DISCONTINUED] loratadine (CLARITIN) 10 MG tablet Take 1 tablet (10 mg total) by mouth every morning. For allergies

## 2013-07-05 NOTE — Patient Instructions (Signed)
Use proair as needed Stop spiriva You do not have Copd Work on weight loss Follow reflux diet We will check you oxygen levels on room air  at night again later this year  Return 6 months

## 2013-07-06 ENCOUNTER — Encounter: Payer: Self-pay | Admitting: Critical Care Medicine

## 2013-07-06 NOTE — Assessment & Plan Note (Signed)
Mild intermittent asthma  no evidence of COPD on pulmonary function testing Plan Continue as needed albuterol Discontinue further maintenance inhalers

## 2013-07-23 ENCOUNTER — Encounter (HOSPITAL_COMMUNITY): Payer: Self-pay | Admitting: Physician Assistant

## 2013-07-23 ENCOUNTER — Telehealth: Payer: Self-pay | Admitting: Cardiology

## 2013-07-23 ENCOUNTER — Observation Stay (HOSPITAL_COMMUNITY)
Admission: AD | Admit: 2013-07-23 | Discharge: 2013-07-25 | Disposition: A | Discharge status: Home / Self Care | Payer: BC Managed Care – PPO | Source: Other Acute Inpatient Hospital | Attending: Cardiology | Admitting: Cardiology

## 2013-07-23 DIAGNOSIS — E039 Hypothyroidism, unspecified: Secondary | ICD-10-CM | POA: Insufficient documentation

## 2013-07-23 DIAGNOSIS — F322 Major depressive disorder, single episode, severe without psychotic features: Secondary | ICD-10-CM | POA: Diagnosis present

## 2013-07-23 DIAGNOSIS — Z915 Personal history of self-harm: Secondary | ICD-10-CM

## 2013-07-23 DIAGNOSIS — I10 Essential (primary) hypertension: Secondary | ICD-10-CM | POA: Diagnosis present

## 2013-07-23 DIAGNOSIS — E876 Hypokalemia: Secondary | ICD-10-CM | POA: Diagnosis not present

## 2013-07-23 DIAGNOSIS — I2510 Atherosclerotic heart disease of native coronary artery without angina pectoris: Secondary | ICD-10-CM | POA: Diagnosis present

## 2013-07-23 DIAGNOSIS — E669 Obesity, unspecified: Secondary | ICD-10-CM | POA: Diagnosis not present

## 2013-07-23 DIAGNOSIS — Z9151 Personal history of suicidal behavior: Secondary | ICD-10-CM

## 2013-07-23 DIAGNOSIS — R079 Chest pain, unspecified: Secondary | ICD-10-CM | POA: Diagnosis present

## 2013-07-23 DIAGNOSIS — J4489 Other specified chronic obstructive pulmonary disease: Secondary | ICD-10-CM | POA: Diagnosis present

## 2013-07-23 DIAGNOSIS — E785 Hyperlipidemia, unspecified: Secondary | ICD-10-CM | POA: Diagnosis not present

## 2013-07-23 DIAGNOSIS — Z9861 Coronary angioplasty status: Secondary | ICD-10-CM | POA: Diagnosis not present

## 2013-07-23 DIAGNOSIS — K2270 Barrett's esophagus without dysplasia: Secondary | ICD-10-CM | POA: Insufficient documentation

## 2013-07-23 DIAGNOSIS — F319 Bipolar disorder, unspecified: Secondary | ICD-10-CM | POA: Diagnosis not present

## 2013-07-23 DIAGNOSIS — Z7902 Long term (current) use of antithrombotics/antiplatelets: Secondary | ICD-10-CM | POA: Insufficient documentation

## 2013-07-23 DIAGNOSIS — R0789 Other chest pain: Secondary | ICD-10-CM | POA: Diagnosis present

## 2013-07-23 DIAGNOSIS — K219 Gastro-esophageal reflux disease without esophagitis: Secondary | ICD-10-CM | POA: Diagnosis present

## 2013-07-23 DIAGNOSIS — J45909 Unspecified asthma, uncomplicated: Secondary | ICD-10-CM | POA: Diagnosis not present

## 2013-07-23 DIAGNOSIS — G8929 Other chronic pain: Secondary | ICD-10-CM | POA: Diagnosis present

## 2013-07-23 DIAGNOSIS — Z87891 Personal history of nicotine dependence: Secondary | ICD-10-CM | POA: Diagnosis not present

## 2013-07-23 DIAGNOSIS — J449 Chronic obstructive pulmonary disease, unspecified: Secondary | ICD-10-CM | POA: Diagnosis present

## 2013-07-23 DIAGNOSIS — R9439 Abnormal result of other cardiovascular function study: Secondary | ICD-10-CM

## 2013-07-23 HISTORY — DX: Unspecified asthma, uncomplicated: J45.909

## 2013-07-23 LAB — CBC WITH DIFFERENTIAL/PLATELET
BASOS PCT: 1 % (ref 0–1)
Basophils Absolute: 0.1 10*3/uL (ref 0.0–0.1)
Eosinophils Absolute: 0.3 10*3/uL (ref 0.0–0.7)
Eosinophils Relative: 3 % (ref 0–5)
HEMATOCRIT: 39 % (ref 36.0–46.0)
HEMOGLOBIN: 13.1 g/dL (ref 12.0–15.0)
LYMPHS ABS: 3 10*3/uL (ref 0.7–4.0)
LYMPHS PCT: 32 % (ref 12–46)
MCH: 35.3 pg — ABNORMAL HIGH (ref 26.0–34.0)
MCHC: 33.6 g/dL (ref 30.0–36.0)
MCV: 105.1 fL — ABNORMAL HIGH (ref 78.0–100.0)
MONO ABS: 0.7 10*3/uL (ref 0.1–1.0)
MONOS PCT: 7 % (ref 3–12)
NEUTROS ABS: 5.2 10*3/uL (ref 1.7–7.7)
NEUTROS PCT: 56 % (ref 43–77)
Platelets: 260 10*3/uL (ref 150–400)
RBC: 3.71 MIL/uL — AB (ref 3.87–5.11)
RDW: 12.7 % (ref 11.5–15.5)
WBC: 9.2 10*3/uL (ref 4.0–10.5)

## 2013-07-23 LAB — BASIC METABOLIC PANEL
BUN: 14 mg/dL (ref 6–23)
CHLORIDE: 104 meq/L (ref 96–112)
CO2: 24 mEq/L (ref 19–32)
Calcium: 9.1 mg/dL (ref 8.4–10.5)
Creatinine, Ser: 0.66 mg/dL (ref 0.50–1.10)
Glucose, Bld: 147 mg/dL — ABNORMAL HIGH (ref 70–99)
POTASSIUM: 3.4 meq/L — AB (ref 3.7–5.3)
SODIUM: 144 meq/L (ref 137–147)

## 2013-07-23 LAB — TROPONIN I: Troponin I: <0.3 ng/mL (ref <0.30)

## 2013-07-23 LAB — MAGNESIUM: Magnesium: 1.8 mg/dL (ref 1.5–2.5)

## 2013-07-23 LAB — PROTIME-INR
INR: 0.98 (ref 0.00–1.49)
PROTHROMBIN TIME: 12.8 s (ref 11.6–15.2)

## 2013-07-23 LAB — MRSA PCR SCREENING: MRSA by PCR: NEGATIVE

## 2013-07-23 LAB — PRO B NATRIURETIC PEPTIDE: Pro B Natriuretic peptide (BNP): 12.6 pg/mL (ref 0–125)

## 2013-07-23 MED ORDER — MORPHINE SULFATE 2 MG/ML IJ SOLN
2.0000 mg | INTRAMUSCULAR | Status: DC | PRN
  Administered 2013-07-23 – 2013-07-25 (×9): 2 mg via INTRAVENOUS
  Filled 2013-07-23 (×8): qty 1

## 2013-07-23 MED ORDER — ASPIRIN EC 81 MG PO TBEC
81.0000 mg | DELAYED_RELEASE_TABLET | Freq: Every day | ORAL | Status: DC
  Administered 2013-07-24 – 2013-07-25 (×2): 81 mg via ORAL
  Filled 2013-07-23 (×2): qty 1

## 2013-07-23 MED ORDER — ATORVASTATIN CALCIUM 40 MG PO TABS
40.0000 mg | ORAL_TABLET | Freq: Every day | ORAL | Status: DC
  Administered 2013-07-23 – 2013-07-24 (×2): 40 mg via ORAL
  Filled 2013-07-23 (×3): qty 1

## 2013-07-23 MED ORDER — ALBUTEROL SULFATE (2.5 MG/3ML) 0.083% IN NEBU: 2.5000 mg | INHALATION_SOLUTION | Freq: Four times a day (QID) | RESPIRATORY_TRACT | Status: DC | PRN

## 2013-07-23 MED ORDER — ACETAMINOPHEN 325 MG PO TABS: 650.0000 mg | ORAL_TABLET | ORAL | Status: DC | PRN

## 2013-07-23 MED ORDER — CLOPIDOGREL BISULFATE 75 MG PO TABS
75.0000 mg | ORAL_TABLET | Freq: Every day | ORAL | Status: DC
  Administered 2013-07-23 – 2013-07-24 (×2): 75 mg via ORAL
  Filled 2013-07-23 (×3): qty 1

## 2013-07-23 MED ORDER — LOSARTAN POTASSIUM 50 MG PO TABS
50.0000 mg | ORAL_TABLET | Freq: Every day | ORAL | Status: DC
  Administered 2013-07-24: 50 mg via ORAL
  Filled 2013-07-23 (×2): qty 1

## 2013-07-23 MED ORDER — AMLODIPINE BESYLATE 2.5 MG PO TABS
2.5000 mg | ORAL_TABLET | Freq: Every day | ORAL | Status: DC
  Administered 2013-07-24: 2.5 mg via ORAL
  Filled 2013-07-23 (×2): qty 1

## 2013-07-23 MED ORDER — DICYCLOMINE HCL 10 MG PO CAPS
10.0000 mg | ORAL_CAPSULE | Freq: Four times a day (QID) | ORAL | Status: DC | PRN
  Filled 2013-07-23: qty 1

## 2013-07-23 MED ORDER — ALBUTEROL SULFATE (2.5 MG/3ML) 0.083% IN NEBU: 3.0000 mL | INHALATION_SOLUTION | RESPIRATORY_TRACT | Status: DC | PRN

## 2013-07-23 MED ORDER — ENOXAPARIN SODIUM 40 MG/0.4ML ~~LOC~~ SOLN
40.0000 mg | SUBCUTANEOUS | Status: DC
  Filled 2013-07-23 (×3): qty 0.4

## 2013-07-23 MED ORDER — DULOXETINE HCL 60 MG PO CPEP
60.0000 mg | ORAL_CAPSULE | Freq: Every day | ORAL | Status: DC
  Administered 2013-07-24 – 2013-07-25 (×2): 60 mg via ORAL
  Filled 2013-07-23 (×2): qty 1

## 2013-07-23 MED ORDER — REGADENOSON 0.4 MG/5ML IV SOLN
0.4000 mg | Freq: Once | INTRAVENOUS | Status: AC
  Administered 2013-07-24: 0.4 mg via INTRAVENOUS
  Filled 2013-07-23: qty 5

## 2013-07-23 MED ORDER — LEVOTHYROXINE SODIUM 50 MCG PO TABS
50.0000 ug | ORAL_TABLET | Freq: Every day | ORAL | Status: DC
  Administered 2013-07-24 – 2013-07-25 (×2): 50 ug via ORAL
  Filled 2013-07-23 (×3): qty 1

## 2013-07-23 MED ORDER — TRAZODONE HCL 150 MG PO TABS
300.0000 mg | ORAL_TABLET | Freq: Every day | ORAL | Status: DC
  Administered 2013-07-23 – 2013-07-24 (×2): 300 mg via ORAL
  Filled 2013-07-23 (×3): qty 2

## 2013-07-23 MED ORDER — SODIUM CHLORIDE 0.9 % IJ SOLN
3.0000 mL | Freq: Two times a day (BID) | INTRAMUSCULAR | Status: DC
Start: 2013-07-23 — End: 2013-07-25
  Administered 2013-07-23 – 2013-07-24 (×2): 3 mL via INTRAVENOUS

## 2013-07-23 MED ORDER — PANTOPRAZOLE SODIUM 40 MG PO TBEC
80.0000 mg | DELAYED_RELEASE_TABLET | Freq: Two times a day (BID) | ORAL | Status: DC
  Administered 2013-07-23 – 2013-07-25 (×4): 80 mg via ORAL
  Filled 2013-07-23 (×5): qty 2

## 2013-07-23 MED ORDER — NITROGLYCERIN 0.4 MG SL SUBL
0.4000 mg | SUBLINGUAL_TABLET | SUBLINGUAL | Status: DC | PRN
  Filled 2013-07-23: qty 25

## 2013-07-23 MED ORDER — ENOXAPARIN SODIUM 40 MG/0.4ML ~~LOC~~ SOLN: 40.0000 mg | SUBCUTANEOUS | Status: DC

## 2013-07-23 MED ORDER — ARIPIPRAZOLE 5 MG PO TABS
5.0000 mg | ORAL_TABLET | Freq: Every day | ORAL | Status: DC
  Filled 2013-07-23 (×2): qty 1

## 2013-07-23 MED ORDER — SODIUM CHLORIDE 0.9 % IV SOLN: 250.0000 mL | INTRAVENOUS | Status: DC | PRN

## 2013-07-23 MED ORDER — SODIUM CHLORIDE 0.9 % IJ SOLN: 3.0000 mL | INTRAMUSCULAR | Status: DC | PRN

## 2013-07-23 MED ORDER — ONDANSETRON HCL 4 MG/2ML IJ SOLN: 4.0000 mg | Freq: Four times a day (QID) | INTRAMUSCULAR | Status: DC | PRN

## 2013-07-23 NOTE — H&P (Signed)
See Consult Note by Lorretta Harp, PA.   I have seen and evaluated the patient this PM along with Lorretta Harp, PA. I agree with her findings, examination as well as impression recommendations.  58 y/o woman with known CAD & PCI - ~50-60% ISR on recent cath p/w persistent L-sided CP (under breast), described as pressure. Somewhat reproducible (exquisit tenderness on exam throughout the chest). Troponin Negative, ECG from [place] not available.  Impression - persistent L sided CP with minimal relief from SL NTG - relief from Morphine; Atypical Sx for ACS - unlikely for ~4 days of persistent Angina without + Troponin levels.  Last CAth was 05/2011 after Negative Myoview for peristent pain - FFR of ~50-60% ISR on in RCA stent was 0.98.  My suspicion is that her Sx are most consistent with MSK pain -- she reports probable h/o fibromyalgia.  Plan: Analgesics for CP; continue to cycle cardiac markers --> Lexiscan Myoview in AM.  Continue home medications.  Leonie Man, M.D., M.S.  [facility]  [address] [place], [place] [address]  [phone]  Pager # [phone]  07/23/2013  7:23 PM

## 2013-07-23 NOTE — Telephone Encounter (Signed)
Spoke with Kara Lamb, since Friday night she has a pressure in her chest that radiates up into the left side of her neck. The discomfort is not simular to her usual angina but reports it is similar to the MI pain she had. It occurs at rest, she lies down and elevates her legs, the pain eases but never goes away. Her SOB has not changes and she does report a couple episodes of diaphoresis yesterday. nshe has not tried NTG, her bp this am is 149/78. Kara Lamb will try NTG while I speak with dr hochrein.

## 2013-07-23 NOTE — Telephone Encounter (Signed)
Discussed with dr hochrein, pt advised to go to the nearest ER for evaluation. She did try the NTG with no change in her symptoms. She was advised to have someone transport her to the nearest ER to be checked and then she can be transferred to [place] if needed. Patient voiced understanding

## 2013-07-23 NOTE — Consult Note (Signed)
CARDIOLOGY CONSULT NOTE   Patient ID: Kara Lamb MRN: [id number] DOB/AGE: 16-Feb-1956 58 y.o.  Admit date: 07/23/2013  Primary Physician   [facility] Angelique Blonder., MD Primary Cardiologist   Dr. Percival Spanish  Reason for Consultation   Chest pain  HPI: Kara Lamb is a 57 y.o. female with a history of  CAD s/p BMS to RCA and LCX 2008; ISR of RCA rx'd with Xience DES 12/09; cath 11/10: nonobs; cath 2/12: nonobs; cath 05/2011 nonobs; cath 02/2013 nonobs, chronic chest pain, polysubstance abuse, asthma, Barrett's esophagus, HTN, HL, obesity, reflex sympathetic dystrophy and suicidal depression. She reported frequent chest pain last year. She was admitted to the hospital where she ruled out for MI by enzymes (02/22/13). LHC at this time revealed mid OM1 stent patent with 20-30% proximal to the OM stent, mid RCA stent patent with 50-60% ISR, EF 60-65%. Continued medical therapy was recommended. Last seen by Richardson Dopp PA-C in 02/2013. She was transferred from [place] today for chest pain that radiates into left neck and is reminsent of her cardiac chest pain. She reports that the discomfort today is not simular to her usual angina but reports it is similar to the MI pain she had. She has pain at rest; however it is relieved when she lies down and elevates her legs. She reports chronic SOB that has not changed and she does report a couple episodes of diaphoresis yesterday. NTG does not relieve the pain. She requests more narcotic pain medication.     Past Medical History  Diagnosis Date  . CAD (coronary artery disease)     a. s/p BMS to RCA and LCX 2008; b. ISR of RCA rx'd with Xience DES 12/09; c. Requires extensive sedation for cath; d. cath 11/10: nonobs; e.Cath 2/12: nonobs; f. 05/2011 Cath: nonobs; g. 02/2013 nonobs, EF 60-65%.  . Chronic chest pain   . Anxiety and depression   . H/O: suicide attempt     drug overdose 3/08  . Hypertension   . Obesity   . Tobacco abuse     a. quit in  2012  . H/O ETOH abuse   . Arthritis   . Hyperlipidemia   . Colitis   . GERD (gastroesophageal reflux disease)   . Complication of anesthesia   . Asthma      Past Surgical History  Procedure Laterality Date  . Ptca      stent  . Colon biopsy  08/12/2008  . Tubal ligation  1982  . Cesarean section      x 3  . Carpal tunnel release      Allergies  Allergen Reactions  . Prednisone     psychosis  . Imdur [Isosorbide]     Low blood pressure  . Nitroglycerin     Patches--headaches  . Zoloft [Sertraline Hcl]     Severe diarrhea    I have reviewed the patient's current medications       Prior to Admission medications   Medication Sig Start Date End Date Taking? Authorizing Provider  albuterol (PROVENTIL HFA;VENTOLIN HFA) 108 (90 BASE) MCG/ACT inhaler Inhale 2 puffs into the lungs every 4 (four) hours as needed for wheezing or shortness of breath. 07/05/13   Elsie Stain, MD  albuterol (PROVENTIL) (2.5 MG/3ML) 0.083% nebulizer solution Take 3 mLs (2.5 mg total) by nebulization every 6 (six) hours as needed for wheezing or shortness of breath. 03/06/13   Encarnacion Slates, NP  aluminum-magnesium hydroxide-simethicone (MAALOX)  200-200-20 MG/5ML SUSP Take by mouth every 4 (four) hours as needed.    Historical Provider, MD  amLODipine (NORVASC) 2.5 MG tablet Take 1 tablet (2.5 mg total) by mouth daily. For hypertension 03/12/13   Liliane Shi, PA-C  ARIPiprazole (ABILIFY) 5 MG tablet Take 1 tablet (5 mg total) by mouth daily at 8 pm. For mood control 03/06/13   Encarnacion Slates, NP  aspirin EC 81 MG tablet Take 1 tablet (81 mg total) by mouth every morning. For heart health 03/06/13   Encarnacion Slates, NP  atorvastatin (LIPITOR) 40 MG tablet Take 1 tablet (40 mg total) by mouth daily with supper. For high cholesterol control 05/04/13   Minus Breeding, MD  clopidogrel (PLAVIX) 75 MG tablet Take 1 tablet (75 mg total) by mouth daily with supper. For heart condition 05/04/13   Minus Breeding,  MD  dicyclomine (BENTYL) 10 MG capsule Take 10 mg by mouth every 6 (six) hours as needed for spasms.    Historical Provider, MD  DULoxetine (CYMBALTA) 60 MG capsule Take 1 capsule (60 mg total) by mouth daily. For depression 03/06/13   Encarnacion Slates, NP  esomeprazole (NEXIUM) 40 MG capsule Take 40 mg by mouth 2 (two) times daily before a meal. For acid reflux 03/06/13   Encarnacion Slates, NP  levothyroxine (SYNTHROID, LEVOTHROID) 50 MCG tablet Take 1 tablet (50 mcg total) by mouth daily before breakfast. For low thyroid hormone 03/06/13   Encarnacion Slates, NP  losartan (COZAAR) 50 MG tablet Take 1 tablet (50 mg total) by mouth daily. For hypertension 05/04/13   Minus Breeding, MD  Multiple Vitamins-Minerals (MULTIVITAMIN WITH MINERALS) tablet Take 1 tablet by mouth every morning. For low vitamin 03/06/13   Encarnacion Slates, NP  nitroGLYCERIN (NITROSTAT) 0.4 MG SL tablet Place 1 tablet (0.4 mg total) under the tongue every 5 (five) minutes as needed for chest pain. 06/21/13   Minus Breeding, MD  traZODone (DESYREL) 300 MG tablet Take 1 tablet (300 mg total) by mouth at bedtime. For sleep 03/06/13   Encarnacion Slates, NP     History   Social History  . Marital Status: Divorced    Spouse Name: N/A    Number of Children: 3  . Years of Education: N/A   Occupational History  .      Workmen's comp   Social History Main Topics  . Smoking status: Former Smoker -- 1.00 packs/day for 35 years    Types: Cigarettes    Quit date: 02/23/2010  . Smokeless tobacco: Never Used  . Alcohol Use: No     Comment: rarely  . Drug Use: No  . Sexual Activity: Not Currently   Other Topics Concern  . Not on file   Social History Narrative  . No narrative on file    Family Status  Relation Status Death Age  . Mother Alive   . Father Deceased     lung cancer, clotting disorder, emphysema  . Sister Alive   . Brother Alive    Family History  Problem Relation Age of Onset  . Coronary artery disease Mother   . Emphysema  Mother   . Colon cancer Father   . Prostate cancer Father      ROS:  Full 14 point review of systems complete and found to be negative unless listed above.  Physical Exam: Blood pressure 113/64, pulse 81, temperature 97.6 F (36.4 C), temperature source Oral, resp. rate 21, height 5\' 1"  (1.549  m), weight 210 lb 15.7 oz (95.7 kg), SpO2 96.00%.  General: Well developed, well nourished, female in no acute distress, obese Head: Eyes PERRLA, No xanthomas.   Normocephalic and atraumatic, oropharynx without edema or exudate. Dentition:  Lungs: Diffuse wheezes and expiatory cough Heart: HRRR S1 S2, no rub/gallop, Heart irregular rate and rhythm with S1, S2  murmur. pulses are 2+ extrem.   Neck: No carotid bruits. No lymphadenopathy.  JVD. Abdomen: Bowel sounds present, abdomen soft and non-tender without masses or hernias noted. Msk:  No spine or cva tenderness. No weakness, no joint deformities or effusions. Extremities: No clubbing or cyanosis.  No edema.  Neuro: Alert and oriented X 3. No focal deficits noted. Psych:  Good affect, responds appropriately Skin: No rashes or lesions noted.  Labs: Drawn at [facility] -- normal   ECG:  HR 80, NSR  Radiology:  No results found.  ASSESSMENT AND PLAN:    Active Problems:   Chronic chest pain   Chest pain   HYPERLIPIDEMIA-MIXED   HYPERTENSION, BENIGN   Bipolar 1 disorder   Severe major depression without psychotic features   Asthma, mild intermittent   CAD (coronary artery disease)   H/O: suicide attempt   Obesity   GERD (gastroesophageal reflux disease)  Chest pain ANTONIA STANSBERRY is a 58 y.o. female with a history of  CAD s/p BMS to RCA and LCX 2008; ISR of RCA rx'd with Xience DES 12/09; cath 11/10: nonobs; cath 2/12: nonobs; cath 05/2011 nonobs; cath 02/2013 nonobs, chronic chest pain, polysubstance abuse, asthma, Barrett's esophagus, HTN, HLD, obesity, reflex sympathetic dystrophy and suicidal depression who was transferred from  [facility] today for chest pain.  Chest pain- patient with known CAD s/p BMS to RCA and LCX 2008; ISR of RCA rx'd with Xience DES 12/09; she has had 4 cardiac caths since with stable, non-obstructive disease. LHC in 02/2013 revealed mid OM1 stent patent with 20-30% proximal to the OM stent, mid RCA stent patent with 50-60% ISR, EF 60-65%. Continued medical therapy was recommended.  - Troponin x1 negative. EKG with no acute ST or TW changes - Will admit for observation; cycle enzymes and serial ekgs - Hx of chronic chest pain, also has a history of polysubstance abuse - Continue medical therapy with Lipitor, ASA/plavix, not on a beta blocker- defer to primary cardiology team - Lexiscan myovue in AM/ NPO after midnight  HTN- continue losartan, amlodipine  HLD- continue statin  GERD- protonix  Asthma- continue home meds  Hypothyroidism- continue synthroid   SignedPerry Mount, PA-C 07/23/2013 6:33 PM  Pager [phone]  Co-Sign MD

## 2013-07-23 NOTE — Consult Note (Signed)
I have seen and evaluated the patient this PM along with Lorretta Harp, PA. I agree with her findings, examination as well as impression recommendations.  58 y/o woman with known CAD & PCI - ~50-60% ISR on recent cath p/w persistent L-sided CP (under breast), described as pressure.  Somewhat reproducible (exquisit tenderness on exam throughout the chest).  Troponin Negative, ECG from [place] not available.  Impression - persistent L sided CP with minimal relief from SL NTG - relief from Morphine; Atypical Sx for ACS - unlikely for ~4 days of persistent Angina without + Troponin levels.  Last CAth was 05/2011 after Negative Myoview for peristent pain - FFR of ~50-60% ISR on in RCA stent was 0.98.    My suspicion is that her Sx are most consistent with MSK pain -- she reports probable h/o fibromyalgia.  Plan: Analgesics for CP; continue to cycle cardiac markers --> Lexiscan Myoview in AM.  Continue home medications.  Leonie Man, M.D., M.S. [facility] [address] [place], [place]  [address]  [phone] Pager # [phone] 07/23/2013 7:23 PM

## 2013-07-23 NOTE — Telephone Encounter (Signed)
New Problem:  Pt is c/o chest pain and neck/shoulder pain. Pt states it feels like it did when she had her heart attack.

## 2013-07-24 ENCOUNTER — Observation Stay (HOSPITAL_COMMUNITY): Payer: BC Managed Care – PPO

## 2013-07-24 DIAGNOSIS — R079 Chest pain, unspecified: Secondary | ICD-10-CM

## 2013-07-24 DIAGNOSIS — R0789 Other chest pain: Secondary | ICD-10-CM | POA: Diagnosis present

## 2013-07-24 LAB — RAPID URINE DRUG SCREEN, HOSP PERFORMED
AMPHETAMINES: POSITIVE — AB
BENZODIAZEPINES: NOT DETECTED
Barbiturates: NOT DETECTED
Cocaine: NOT DETECTED
Opiates: POSITIVE — AB
TETRAHYDROCANNABINOL: NOT DETECTED

## 2013-07-24 LAB — COMPREHENSIVE METABOLIC PANEL
ALK PHOS: 78 U/L (ref 39–117)
ALT: 21 U/L (ref 0–35)
AST: 27 U/L (ref 0–37)
Albumin: 3.3 g/dL — ABNORMAL LOW (ref 3.5–5.2)
BUN: 11 mg/dL (ref 6–23)
CALCIUM: 8.9 mg/dL (ref 8.4–10.5)
CO2: 29 mEq/L (ref 19–32)
Chloride: 108 mEq/L (ref 96–112)
Creatinine, Ser: 0.61 mg/dL (ref 0.50–1.10)
GFR calc non Af Amer: >90 mL/min (ref >90)
GLUCOSE: 116 mg/dL — AB (ref 70–99)
POTASSIUM: 3.7 meq/L (ref 3.7–5.3)
SODIUM: 147 meq/L (ref 137–147)
Total Bilirubin: 0.2 mg/dL — ABNORMAL LOW (ref 0.3–1.2)
Total Protein: 6.4 g/dL (ref 6.0–8.3)

## 2013-07-24 LAB — CBC
HCT: 39.3 % (ref 36.0–46.0)
HEMOGLOBIN: 13.2 g/dL (ref 12.0–15.0)
MCH: 35.6 pg — AB (ref 26.0–34.0)
MCHC: 33.6 g/dL (ref 30.0–36.0)
MCV: 105.9 fL — AB (ref 78.0–100.0)
Platelets: 238 10*3/uL (ref 150–400)
RBC: 3.71 MIL/uL — AB (ref 3.87–5.11)
RDW: 12.6 % (ref 11.5–15.5)
WBC: 6.8 10*3/uL (ref 4.0–10.5)

## 2013-07-24 LAB — TSH: TSH: 4.421 u[IU]/mL (ref 0.350–4.500)

## 2013-07-24 LAB — PLATELET INHIBITION P2Y12: Platelet Function  P2Y12: 175 [PRU] — ABNORMAL LOW (ref 194–418)

## 2013-07-24 LAB — TROPONIN I
Troponin I: <0.3 ng/mL (ref <0.30)
Troponin I: <0.3 ng/mL (ref <0.30)

## 2013-07-24 LAB — PROTIME-INR
INR: 0.89 (ref 0.00–1.49)
PROTHROMBIN TIME: 11.9 s (ref 11.6–15.2)

## 2013-07-24 MED ORDER — DIAZEPAM 5 MG PO TABS
5.0000 mg | ORAL_TABLET | ORAL | Status: AC
  Administered 2013-07-25: 5 mg via ORAL
  Filled 2013-07-24: qty 1

## 2013-07-24 MED ORDER — DIAZEPAM 5 MG PO TABS: 5.0000 mg | ORAL_TABLET | ORAL | Status: DC

## 2013-07-24 MED ORDER — SODIUM CHLORIDE 0.9 % IV SOLN: 250.0000 mL | INTRAVENOUS | Status: DC | PRN

## 2013-07-24 MED ORDER — TECHNETIUM TC 99M SESTAMIBI GENERIC - CARDIOLITE
30.0000 | Freq: Once | INTRAVENOUS | Status: AC | PRN
  Administered 2013-07-24: 30 via INTRAVENOUS

## 2013-07-24 MED ORDER — SODIUM CHLORIDE 0.9 % IJ SOLN: 3.0000 mL | INTRAMUSCULAR | Status: DC | PRN

## 2013-07-24 MED ORDER — REGADENOSON 0.4 MG/5ML IV SOLN
INTRAVENOUS | Status: AC
  Administered 2013-07-24: 0.4 mg via INTRAVENOUS
  Filled 2013-07-24: qty 5

## 2013-07-24 MED ORDER — SENNA 8.6 MG PO TABS
2.0000 | ORAL_TABLET | Freq: Once | ORAL | Status: AC
  Administered 2013-07-24: 17.2 mg via ORAL
  Filled 2013-07-24: qty 2

## 2013-07-24 MED ORDER — SODIUM CHLORIDE 0.9 % IV SOLN
INTRAVENOUS | Status: DC
  Administered 2013-07-25: 04:00:00 via INTRAVENOUS

## 2013-07-24 MED ORDER — MORPHINE SULFATE 4 MG/ML IJ SOLN
INTRAMUSCULAR | Status: AC
  Filled 2013-07-24: qty 1

## 2013-07-24 MED ORDER — TECHNETIUM TC 99M SESTAMIBI GENERIC - CARDIOLITE
10.0000 | Freq: Once | INTRAVENOUS | Status: AC | PRN
  Administered 2013-07-24: 10 via INTRAVENOUS

## 2013-07-24 MED ORDER — SODIUM CHLORIDE 0.9 % IJ SOLN
3.0000 mL | Freq: Two times a day (BID) | INTRAMUSCULAR | Status: DC
  Administered 2013-07-24: 3 mL via INTRAVENOUS

## 2013-07-24 MED ORDER — DOCUSATE SODIUM 100 MG PO CAPS
100.0000 mg | ORAL_CAPSULE | Freq: Two times a day (BID) | ORAL | Status: DC
  Administered 2013-07-24: 100 mg via ORAL
  Filled 2013-07-24 (×3): qty 1

## 2013-07-24 NOTE — Discharge Instructions (Addendum)
Chest Pain (Nonspecific) °It is often hard to give a specific diagnosis for the cause of chest pain. There is always a chance that your pain could be related to something serious, such as a heart attack or a blood clot in the lungs. You need to follow up with your caregiver for further evaluation. °CAUSES  °· Heartburn. °· Pneumonia or bronchitis. °· Anxiety or stress. °· Inflammation around your heart (pericarditis) or lung (pleuritis or pleurisy). °· A blood clot in the lung. °· A collapsed lung (pneumothorax). It can develop suddenly on its own (spontaneous pneumothorax) or from injury (trauma) to the chest. °· Shingles infection (herpes zoster virus). °The chest wall is composed of bones, muscles, and cartilage. Any of these can be the source of the pain. °· The bones can be bruised by injury. °· The muscles or cartilage can be strained by coughing or overwork. °· The cartilage can be affected by inflammation and become sore (costochondritis). °DIAGNOSIS  °Lab tests or other studies, such as X-rays, electrocardiography, stress testing, or cardiac imaging, may be needed to find the cause of your pain.  °TREATMENT  °· Treatment depends on what may be causing your chest pain. Treatment may include: °· Acid blockers for heartburn. °· Anti-inflammatory medicine. °· Pain medicine for inflammatory conditions. °· Antibiotics if an infection is present. °· You may be advised to change lifestyle habits. This includes stopping smoking and avoiding alcohol, caffeine, and chocolate. °· You may be advised to keep your head raised (elevated) when sleeping. This reduces the chance of acid going backward from your stomach into your esophagus. °· Most of the time, nonspecific chest pain will improve within 2 to 3 days with rest and mild pain medicine. °HOME CARE INSTRUCTIONS  °· If antibiotics were prescribed, take your antibiotics as directed. Finish them even if you start to feel better. °· For the next few days, avoid physical  activities that bring on chest pain. Continue physical activities as directed. °· Do not smoke. °· Avoid drinking alcohol. °· Only take over-the-counter or prescription medicine for pain, discomfort, or fever as directed by your caregiver. °· Follow your caregiver's suggestions for further testing if your chest pain does not go away. °· Keep any follow-up appointments you made. If you do not go to an appointment, you could develop lasting (chronic) problems with pain. If there is any problem keeping an appointment, you must call to reschedule. °SEEK MEDICAL CARE IF:  °· You think you are having problems from the medicine you are taking. Read your medicine instructions carefully. °· Your chest pain does not go away, even after treatment. °· You develop a rash with blisters on your chest. °SEEK IMMEDIATE MEDICAL CARE IF:  °· You have increased chest pain or pain that spreads to your arm, neck, jaw, back, or abdomen. °· You develop shortness of breath, an increasing cough, or you are coughing up blood. °· You have severe back or abdominal pain, feel nauseous, or vomit. °· You develop severe weakness, fainting, or chills. °· You have a fever. °THIS IS AN EMERGENCY. Do not wait to see if the pain will go away. Get medical help at once. Call your local emergency services (911 in [place] Do not drive yourself to the hospital. °MAKE SURE YOU:  °· Understand these instructions. °· Will watch your condition. °· Will get help right away if you are not doing well or get worse. °Document Released: 03/10/2005 Document Revised: 08/23/2011 Document Reviewed: 01/04/2008 °[employer]® Patient Information ©2014 [employer].   Diet for Gastroesophageal Reflux Disease, Adult Reflux (acid reflux) is when acid from your stomach flows up into the esophagus. When acid comes in contact with the esophagus, the acid causes irritation and soreness (inflammation) in the esophagus. When reflux happens often or so severely that it causes damage  to the esophagus, it is called gastroesophageal reflux disease (GERD). Nutrition therapy can help ease the discomfort of GERD. FOODS OR DRINKS TO AVOID OR LIMIT  Smoking or chewing tobacco. Nicotine is one of the most potent stimulants to acid production in the gastrointestinal tract.  Caffeinated and decaffeinated coffee and black tea.  Regular or low-calorie carbonated beverages or energy drinks (caffeine-free carbonated beverages are allowed).   Strong spices, such as black pepper, white pepper, red pepper, cayenne, curry powder, and chili powder.  Peppermint or spearmint.  Chocolate.  High-fat foods, including meats and fried foods. Extra added fats including oils, butter, salad dressings, and nuts. Limit these to less than 8 tsp per day.  Fruits and vegetables if they are not tolerated, such as citrus fruits or tomatoes.  Alcohol.  Any food that seems to aggravate your condition. If you have questions regarding your diet, call your caregiver or a registered dietitian. OTHER THINGS THAT MAY HELP GERD INCLUDE:   Eating your meals slowly, in a relaxed setting.  Eating 5 to 6 small meals per day instead of 3 large meals.  Eliminating food for a period of time if it causes distress.  Not lying down until 3 hours after eating a meal.  Keeping the head of your bed raised 6 to 9 inches (15 to 23 cm) by using a foam wedge or blocks under the legs of the bed. Lying flat may make symptoms worse.  Being physically active. Weight loss may be helpful in reducing reflux in overweight or obese adults.  Wear loose fitting clothing EXAMPLE MEAL PLAN This meal plan is approximately 2,000 calories based on [email or web address] meal planning guidelines. Breakfast   cup cooked oatmeal.  1 cup strawberries.  1 cup low-fat milk.  1 oz almonds. Snack  1 cup cucumber slices.  6 oz yogurt (made from low-fat or fat-free milk). Lunch  2 slice whole-wheat bread.  2 oz sliced  [place].  2 tsp mayonnaise.  1 cup blueberries.  1 cup snap peas. Snack  6 whole-wheat crackers.  1 oz string cheese. Dinner   cup brown rice.  1 cup mixed veggies.  1 tsp olive oil.  3 oz grilled fish. Document Released: 05/31/2005 Document Revised: 08/23/2011 Document Reviewed: 04/16/2011 [facility] Patient Information 2014 [place], [place].  Gastroesophageal Reflux Disease, Adult Gastroesophageal reflux disease (GERD) happens when acid from your stomach flows up into the esophagus. When acid comes in contact with the esophagus, the acid causes soreness (inflammation) in the esophagus. Over time, GERD may create small holes (ulcers) in the lining of the esophagus. CAUSES   Increased body weight. This puts pressure on the stomach, making acid rise from the stomach into the esophagus.  Smoking. This increases acid production in the stomach.  Drinking alcohol. This causes decreased pressure in the lower esophageal sphincter (valve or ring of muscle between the esophagus and stomach), allowing acid from the stomach into the esophagus.  Late evening meals and a full stomach. This increases pressure and acid production in the stomach.  A malformed lower esophageal sphincter. Sometimes, no cause is found. SYMPTOMS   Burning pain in the lower part of the mid-chest behind the breastbone and in  the mid-stomach area. This may occur twice a week or more often.  Trouble swallowing.  Sore throat.  Dry cough.  Asthma-like symptoms including chest tightness, shortness of breath, or wheezing. DIAGNOSIS  Your caregiver may be able to diagnose GERD based on your symptoms. In some cases, X-rays and other tests may be done to check for complications or to check the condition of your stomach and esophagus. TREATMENT  Your caregiver may recommend over-the-counter or prescription medicines to help decrease acid production. Ask your caregiver before starting or adding any new medicines.   HOME CARE INSTRUCTIONS   Change the factors that you can control. Ask your caregiver for guidance concerning weight loss, quitting smoking, and alcohol consumption.  Avoid foods and drinks that make your symptoms worse, such as:  Caffeine or alcoholic drinks.  Chocolate.  Peppermint or mint flavorings.  Garlic and onions.  Spicy foods.  Citrus fruits, such as oranges, lemons, or limes.  Tomato-based foods such as sauce, chili, salsa, and pizza.  Fried and fatty foods.  Avoid lying down for the 3 hours prior to your bedtime or prior to taking a nap.  Eat small, frequent meals instead of large meals.  Wear loose-fitting clothing. Do not wear anything tight around your waist that causes pressure on your stomach.  Raise the head of your bed 6 to 8 inches with wood blocks to help you sleep. Extra pillows will not help.  Only take over-the-counter or prescription medicines for pain, discomfort, or fever as directed by your caregiver.  Do not take aspirin, ibuprofen, or other nonsteroidal anti-inflammatory drugs (NSAIDs). SEEK IMMEDIATE MEDICAL CARE IF:   You have pain in your arms, neck, jaw, teeth, or back.  Your pain increases or changes in intensity or duration.  You develop nausea, vomiting, or sweating (diaphoresis).  You develop shortness of breath, or you faint.  Your vomit is green, yellow, black, or looks like coffee grounds or blood.  Your stool is red, bloody, or black. These symptoms could be signs of other problems, such as heart disease, gastric bleeding, or esophageal bleeding. MAKE SURE YOU:   Understand these instructions.  Will watch your condition.  Will get help right away if you are not doing well or get worse. Document Released: 03/10/2005 Document Revised: 08/23/2011 Document Reviewed: 12/18/2010 [facility] Patient Information 2014 [place], [place].  Metoprolol tablets What is this medicine? METOPROLOL (me TOE proe lole) is a beta-blocker.  Beta-blockers reduce the workload on the heart and help it to beat more regularly. This medicine is used to treat high blood pressure and to prevent chest pain. It is also used to after a heart attack and to prevent an additional heart attack from occurring. This medicine may be used for other purposes; ask your health care provider or pharmacist if you have questions. COMMON BRAND NAME(S): Lopressor What should I tell my health care provider before I take this medicine? They need to know if you have any of these conditions: -diabetes -heart or vessel disease like slow heart rate, worsening heart failure, heart block, sick sinus syndrome or Raynaud's disease -kidney disease -liver disease -lung or breathing disease, like asthma or emphysema -pheochromocytoma -thyroid disease -an unusual or allergic reaction to metoprolol, other beta-blockers, medicines, foods, dyes, or preservatives -pregnant or trying to get pregnant -breast-feeding How should I use this medicine? Take this medicine by mouth with a drink of water. Follow the directions on the prescription label. Take this medicine immediately after meals. Take your doses at regular  intervals. Do not take more medicine than directed. Do not stop taking this medicine suddenly. This could lead to serious heart-related effects. Talk to your pediatrician regarding the use of this medicine in children. Special care may be needed. Overdosage: If you think you have taken too much of this medicine contact a poison control center or emergency room at once. NOTE: This medicine is only for you. Do not share this medicine with others. What if I miss a dose? If you miss a dose, take it as soon as you can. If it is almost time for your next dose, take only that dose. Do not take double or extra doses. What may interact with this medicine? This medicine may interact with the following medications: -certain medicines for blood pressure, heart disease,  irregular heart beat -certain medicines for depression like monoamine oxidase (MAO) inhibitors, fluoxetine, or paroxetine -clonidine -dobutamine -epinephrine -isoproterenol -reserpine This list may not describe all possible interactions. Give your health care provider a list of all the medicines, herbs, non-prescription drugs, or dietary supplements you use. Also tell them if you smoke, drink alcohol, or use illegal drugs. Some items may interact with your medicine. What should I watch for while using this medicine? Visit your doctor or health care professional for regular check ups. Contact your doctor right away if your symptoms worsen. Check your blood pressure and pulse rate regularly. Ask your health care professional what your blood pressure and pulse rate should be, and when you should contact them. You may get drowsy or dizzy. Do not drive, use machinery, or do anything that needs mental alertness until you know how this medicine affects you. Do not sit or stand up quickly, especially if you are an older patient. This reduces the risk of dizzy or fainting spells. Contact your doctor if these symptoms continue. Alcohol may interfere with the effect of this medicine. Avoid alcoholic drinks. What side effects may I notice from receiving this medicine? Side effects that you should report to your doctor or health care professional as soon as possible: -allergic reactions like skin rash, itching or hives -cold or numb hands or feet -depression -difficulty breathing -faint -fever with sore throat -irregular heartbeat, chest pain -rapid weight gain -swollen legs or ankles Side effects that usually do not require medical attention (report to your doctor or health care professional if they continue or are bothersome): -anxiety or nervousness -change in sex drive or performance -dry skin -headache -nightmares or trouble sleeping -short term memory loss -stomach upset or diarrhea -unusually  tired This list may not describe all possible side effects. Call your doctor for medical advice about side effects. You may report side effects to FDA at 1-800-FDA-1088. Where should I keep my medicine? Keep out of the reach of children. Store at room temperature between 15 and 30 degrees C (59 and 86 degrees F). Throw away any unused medicine after the expiration date. NOTE: This sheet is a summary. It may not cover all possible information. If you have questions about this medicine, talk to your doctor, pharmacist, or health care provider.  2014, [employer]. (2013-02-02 14:40:36)

## 2013-07-24 NOTE — Progress Notes (Signed)
UR completed 

## 2013-07-24 NOTE — Progress Notes (Signed)
Discussed Myoview result with Dr. Johnsie Cancel area of lateral ischemia is present. Reviewed findings with the patient. While her chest pain has both typical and atypical features, it has been present on and off now for several days. I think considering the findings of her nuclear scan, the patient should undergo cardiac catheterization and possible PCI. I have reviewed the risks, indications, and alternatives to cardiac catheterization and possible PCI with her. She understands and agrees to proceed. This will be scheduled tomorrow for Dr. Tamala Julian.

## 2013-07-24 NOTE — Discharge Summary (Signed)
Name: Kara Lamb MRN: [id number] DOB: [birth year] 58 y.o. PCP: Angelina Sheriff, MD  Date of Admission: 07/23/2013  5:23 PM Date of Discharge: 07/25/2013 Attending Physician: Leonie Man, MD, Dr. Sherren Mocha   Discharge Diagnosis: 1. Atypical chest pain with moderate risk for cardiac etiology with CAD history 2. CAD s/p stents  3. Dyslipidemia  4. HYPERTENSION  5. Psychiatric disorder  6. Tobacco abuse quit >2 years ago 7. Asthma, mild intermittent  8. GERD/Barretts esophagus/hiatal hernia  9. Hypothyroidism  10. Hypokalemia, resolved  Discharge Medications:   Medication List         albuterol (2.5 MG/3ML) 0.083% nebulizer solution  Commonly known as:  PROVENTIL  Take 3 mLs (2.5 mg total) by nebulization every 6 (six) hours as needed for wheezing or shortness of breath.     albuterol 108 (90 BASE) MCG/ACT inhaler  Commonly known as:  PROVENTIL HFA;VENTOLIN HFA  Inhale 2 puffs into the lungs every 4 (four) hours as needed for wheezing or shortness of breath.     amLODipine 2.5 MG tablet  Commonly known as:  NORVASC  Take 1 tablet (2.5 mg total) by mouth daily. For hypertension     aspirin EC 81 MG tablet  Take 1 tablet (81 mg total) by mouth every morning. For heart health     atorvastatin 40 MG tablet  Commonly known as:  LIPITOR  Take 1 tablet (40 mg total) by mouth daily with supper. For high cholesterol control     clopidogrel 75 MG tablet  Commonly known as:  PLAVIX  Take 1 tablet (75 mg total) by mouth daily with supper. For heart condition     dicyclomine 10 MG capsule  Commonly known as:  BENTYL  Take 10 mg by mouth every 6 (six) hours as needed (abdominal pain).     DULoxetine 60 MG capsule  Commonly known as:  CYMBALTA  Take 1 capsule (60 mg total) by mouth daily. For depression     esomeprazole 40 MG capsule  Commonly known as:  NEXIUM  Take 40 mg by mouth 2 (two) times daily before a meal. For acid reflux     levothyroxine 50 MCG  tablet  Commonly known as:  SYNTHROID, LEVOTHROID  Take 1 tablet (50 mcg total) by mouth daily before breakfast. For low thyroid hormone     losartan 50 MG tablet  Commonly known as:  COZAAR  Take 1 tablet (50 mg total) by mouth daily. For hypertension     multivitamin with minerals tablet  Take 1 tablet by mouth every morning. For low vitamin     nitroGLYCERIN 0.4 MG SL tablet  Commonly known as:  NITROSTAT  Place 1 tablet (0.4 mg total) under the tongue every 5 (five) minutes as needed for chest pain.     trazodone 300 MG tablet  Commonly known as:  DESYREL  Take 1 tablet (300 mg total) by mouth at bedtime. For sleep        Disposition and follow-up:   [name] was discharged from [facility] in stable condition.  At the hospital follow up visit please address:  1.   -Repeat BMET -Assess chest pain.  Unclear why patient is not on beta blocker  -Make sure patient follows with Gastroenterology.    2.  Labs / imaging needed at time of follow-up: none   3.  Pending labs/ test needing follow-up: none   Follow-up Appointments:   Discharge Instructions: Discharge  Orders   Future Appointments Provider Department Dept Phone   08/14/2013 11:45 AM Minus Breeding, MD [facility] [phone]   Future Orders Complete By Expires   Diet - low sodium heart healthy  As directed    Diet - low sodium heart healthy  As directed    Discharge instructions  As directed    Comments:     Follow up with [facility] as scheduled [phone]   Follow up with your Gastroenterologist   Read all the information given  Take care   Discharge instructions  As directed    Comments:     Follow up with cardiology as scheduled   Follow up with gastroenterology   Take medications as instructed   Take care   Increase activity slowly  As directed    Increase activity slowly  As directed       Consultations: [facility]. Dr. Ellyn Hack, Dr. Burt Knack, Dr. Tamala Julian  Procedures Performed:  07/24/13 Carlton Adam results  FINDINGS:  ECG: SR normal  Symptoms: Dyspnea  RAW Data: Motion  QPS: 3  Quantitative Gated SPECT EF: 44% apical and lateral hypokinesis  Perfusion Images: Lateral wall ischemia at mid and apical level  IMPRESSION:  Abnormal myovue with lateral wall ischemia at mid and apical level  EF down at 44% see above  2D Echo: no recent   Cardiac Cath:   07/25/13 INDICATIONS: Lateral ischemia on nuclear study. History of prior stents. Having pain at rest. No evidence of infarction or ischemia on EKG to  PROCEDURE: 1. Left heart catheterization; 2. Coronary angiography; 3. Left ventriculography  CONSENT:  The risks, benefits, and details of the procedure were explained in detail to the patient. Risks including death, stroke, heart attack, kidney injury, allergy, limb ischemia, bleeding and radiation injury were discussed. The patient verbalized understanding and wanted to proceed. Informed written consent was obtained.  PROCEDURE TECHNIQUE: After Xylocaine anesthesia a 5 French Slender sheath was placed in the right radial artery with a single double wall needle stick. Coronary angiography was done using a 5 [place] JR 4 and JL 3.5 catheter. Left ventriculography was done using the JR 4 catheter and hand injection.  Hemostasis was achieved with a wrist band without complications.  MEDICATIONS: 3 mg IV Versed, 100 mcg of fentanyl  CONTRAST: Total of 90 cc.  COMPLICATIONS: None  HEMODYNAMICS: Aortic pressure 138/73 mmHg; LV pressure 45/0 mmHg; LVEDP 14 mm mercury  ANGIOGRAPHIC DATA: The left main coronary artery is widely patent.  The left anterior descending artery is transapical and free of significant obstruction. The mid vessel contains eccentric 50-60% narrowing. The proximal vessel contains eccentric 30-40% narrowing..  The left circumflex artery is widely patent. The previously placed mid vessel  stent is without evidence of significant obstruction. The stent in his proximal to a distal bifurcation into the second and third obtuse marginal. Both branches have ostial narrowing similar to 2008 following stent implantation. The more distal branch contains ostial 60-75% narrowing.  The right coronary artery is dominant. The posterior descending and zone the inferoapical segment. The mid vessel contains overlapping stents. The more distally placed stent contains diffuse 40-50% ISR commencing below the first acute marginal branch. This region of ISR is not felt to be clinically/hemodynamically significant. The distal right coronary contains luminal irregularities.Marland Kitchen  LEFT VENTRICULOGRAM: Left ventricular angiogram was done in the 30 RAO projection and revealed symmetrical contractility with EF 55%.  IMPRESSIONS: 1. Widely patent coronary arteries without  significant change in appearance when compared to the most recent images from 2011. There is mild to moderate ISR in the mid RCA, 50-70% stenosis in a branch of the circumflex beyond the mid stent, and 50% mid LAD narrowing.  2. Overall normal LV function  3. Unable to identify a coronary obstructive abnormality that would account for mid to apical lateral wall ischemia. The possibility of dynamic obstruction (coronary vasoconstriction) during the myocardial perfusion study would be a consideration.  RECOMMENDATION: Medical therapy with continued aggressive risk factor modification images and treatment plan reviewed with Dr. Burt Knack.   Admission HPI: 58 y.o PMH CAD s/p BMS (RCA and LCX 2008; ISR of RCA tx'ed with Xience DES 12/09, cath 04/2009 and 05/2011 and 02/2013 nonobstructive caths), chronic chest pain, anxiety/depression, h/o suicide attempt, HTN, obesity, h/o tobacco and alcohol abuse, GERD, Barretts esophagus, hiatal hernia. She presented 2/9 with chest pain/pressure (8/10) radiating to left neck/shoulder pain since Friday prior to admission.  Sensations felt like prior heart attack and were minimally relieved with NTG, morphine. She also had associated sob and sweating x 3-4 episodes.   She denied nausea/vomiting.  She tried to lie down, sleep and elevate her legs with little relief as well.  EKG has been normal sinus rhythm without acute changes.   ROS: Full 14 point review of systems complete and found to be negative unless listed above.  Physical Exam:  Blood pressure 113/64, pulse 81, temperature 97.6 F (36.4 C), temperature source Oral, resp. rate 21, height 5\' 1"  (1.549 m), weight 210 lb 15.7 oz (95.7 kg), SpO2 96.00%.  General: Well developed, well nourished, female in no acute distress, obese  Head: Eyes PERRLA, No xanthomas. Normocephalic and atraumatic, oropharynx without edema or exudate. Dentition:  Lungs: Diffuse wheezes and expiatory cough  Heart: HRRR S1 S2, no rub/gallop, Heart irregular rate and rhythm with S1, S2 murmur. pulses are 2+ extrem.  Neck: No carotid bruits. No lymphadenopathy. JVD.  Abdomen: Bowel sounds present, abdomen soft and non-tender without masses or hernias noted.  Msk: No spine or cva tenderness. No weakness, no joint deformities or effusions.  Extremities: No clubbing or cyanosis. No edema.  Neuro: Alert and oriented X 3. No focal deficits noted.  Psych: Good affect, responds appropriately  Skin: No rashes or lesions noted.   Hospital Course by problem list: 1. Atypical chest pain with moderate risk for cardiac etiology with CAD history 2. CAD s/p stents  3. Dyslipidemia  4. HYPERTENSION  5. Psychiatric disorder  6. Tobacco abuse quit > 2 years ago  7. Asthma, mild intermittent  8. GERD/Barretts esophagus/hiatal hernia  9. Hypothyroidism  10. Hypokalemia, resolved  1. Atypical chest pain with moderate risk for cardiac etiology with CAD history She has a history of chronic atypical chest pain and unstable angina. We ruled out ACS though this could be noncardiac etiology (i.e GI) with  history. Timi score of at least 3=moderate risk. CAD s/p BMS (RCA and LCX 2008; ISR of RCA tx'ed with Xience DES 12/09, cath 04/2009 and 05/2011 and 02/2013 nonobstructive caths). Prior work ups include LHC (02/22/13): mid OM1 stent patent with 20-30% proximal to the OM stent, mid RCA stent patent with 50-60% in stent restenosis, EF 60-65%. Continued medical therapy was recommended.  Left heart cath 05/2011 with patent stent in RCA with 60%-65% ISR (FFR 0.98 - not flow limiting) and non-obstructive disease elsewhere.  She has had at least 3 catherizations 04/2009, 05/2011 and 02/2013 with nonobstructive results.  Troponins were negative  x 3.  EKG normal sinus rhythm without acute changes.  She had a Nuclear stress test this admission 07/24/13 that was abnormal with lateral wall ischemia at mid and apical level EF down at 44% see above. Normal nuclear stress test 02/2011 with EF 70%.  She was give Morphine 2mg  q3 hours, prn NTG, prn Zofran.  In the past she had no benefit from Ranexa. Prior notes indicate chest pain seems to be better on Norvasc.  It is unclear per chart review why the patient is not on a beta blocker.  She had a heart catherization 07/25/13 by Dr. Tamala Julian.  Results above.  We continued medical therapy Aspirin 81 mg, Lipitor 40 mg qd, Plavix 75 mg.    2. CAD s/p stents  Continued Aspirin 81 mg, Lipitor 40 mg qd, Plavix 75 mg.    3. Dyslipidemia    Component  Value  Date/Time    CHOL  145  02/22/2013 0450    TRIG  116  02/22/2013 0450    HDL  51  02/22/2013 0450    CHOLHDL  2.8  02/22/2013 0450    VLDL  23  02/22/2013 0450    LDLCALC  71  02/22/2013 0450    -on statin   4. HYPERTENSION  BP controlled on Norvasc 2.5 mg, Cozaar 50 mg qd   5. Psychiatric disorder  History of anxiety/depression, suicide attempt.  Continued Abilify 5 mg qd (acutally discontinued so will not resume at discharge), Cymbalta 60 mg qd, Trazadone 300 mg qd.   6. Tobacco abuse  Smoking cessation.  Patient quit smoking  2.5 years ago   7. Asthma, mild intermittent  Stable, Albuterol nebulizer given this admission  8. GERD/Barretts esophagus/hiatal hernia  She follows with GI in [place] and had recent upper endoscopy 06/2013.  She was given Protonix 80 mg bid and takes Nexium at home  9. Hypothyroidism  Continued Synthroid 50 mcg   10. Hypokalemia, resolved  noted on 2/9.   11. DVT px  Lovenox   Cardiologist: Dr. Percival Spanish  PCP: Dr. Lovette Cliche [place] Garvin    Discharge Vitals:   BP 127/65  Pulse 81  Temp(Src) 97.8 F (36.6 C) (Oral)  Resp 16  Ht 5\' 1"  (1.549 m)  Wt 210 lb 15.7 oz (95.7 kg)  BMI 39.88 kg/m2  SpO2 96%  Discharge physical exam:  General: resting in bed asleep and arousable, NAD  HEENT:[place]/at, no scleral icterus  Cardiac: RRR, no rubs, murmurs or gallops  Pulm: clear to auscultation bilaterally, no wheezes, rales, or rhonchi.  Abd: soft, nontender, nondistended, BS present, obese  Ext: warm and well perfused, no pedal edema  Neuro: alert and oriented X3, cranial nerves II-XII grossly intact, moving all 4 extremities    Discharge Labs:  Results for LAVESHA, KLEHR (MRN [id number]) as of 07/25/2013 13:26  Ref. Range 07/25/2013 02:15  Sodium Latest Range: 137-147 mEq/L 140  Potassium Latest Range: 3.7-5.3 mEq/L 3.9  Chloride Latest Range: 96-112 mEq/L 101  CO2 Latest Range: 19-32 mEq/L 27  BUN Latest Range: 6-23 mg/dL 12  Creatinine Latest Range: 0.50-1.10 mg/dL 0.71  Calcium Latest Range: 8.4-10.5 mg/dL 9.1  GFR calc non Af Amer Latest Range: >90 mL/min >90  GFR calc Af Amer Latest Range: >90 mL/min >90  Glucose Latest Range: 70-99 mg/dL 148 (H)  Magnesium Latest Range: 1.5-2.5 mg/dL 1.8  Prothrombin Time Latest Range: 11.6-15.2 seconds 13.2  INR Latest Range: 0.00-1.49  1.02   Results for Leichter, Kara A (  MRN [id number]) as of 07/24/2013 10:52  Ref. Range 07/24/2013 07:30  Sodium Latest Range: 137-147 mEq/L 147  Potassium Latest Range: 3.7-5.3 mEq/L 3.7  Chloride  Latest Range: 96-112 mEq/L 108  CO2 Latest Range: 19-32 mEq/L 29  BUN Latest Range: 6-23 mg/dL 11  Creatinine Latest Range: 0.50-1.10 mg/dL 0.61  Calcium Latest Range: 8.4-10.5 mg/dL 8.9  GFR calc non Af Amer Latest Range: >90 mL/min >90  GFR calc Af Amer Latest Range: >90 mL/min >90  Glucose Latest Range: 70-99 mg/dL 116 (H)  Alkaline Phosphatase Latest Range: 39-117 U/L 78  Albumin Latest Range: 3.5-5.2 g/dL 3.3 (L)  AST Latest Range: 0-37 U/L 27  ALT Latest Range: 0-35 U/L 21  Total Protein Latest Range: 6.0-8.3 g/dL 6.4  Total Bilirubin Latest Range: 0.3-1.2 mg/dL 0.2 (L)   Results for MAXENE, BYINGTON (MRN [id number]) as of 07/24/2013 10:52  Ref. Range 07/24/2013 07:30  WBC Latest Range: 4.0-10.5 K/uL 6.8  RBC Latest Range: 3.87-5.11 MIL/uL 3.71 (L)  Hemoglobin Latest Range: 12.0-15.0 g/dL 13.2  HCT Latest Range: 36.0-46.0 % 39.3  MCV Latest Range: 78.0-100.0 fL 105.9 (H)  MCH Latest Range: 26.0-34.0 pg 35.6 (H)  MCHC Latest Range: 30.0-36.0 g/dL 33.6  RDW Latest Range: 11.5-15.5 % 12.6  Platelets Latest Range: 150-400 K/uL 238  Results for TIARNA, KOPPEN (MRN [id number]) as of 07/24/2013 10:52  Ref. Range 07/24/2013 10:18  Prothrombin Time Latest Range: 11.6-15.2 seconds 11.9  INR Latest Range: 0.00-1.49  0.89   Results for KAMBRI, DISMORE (MRN [id number]) as of 07/25/2013 13:26  Ref. Range 07/24/2013 18:19 07/24/2013 20:20  Platelet Function  P2Y12 Latest Range: 194-418 PRU  175 (L)  Amphetamines Latest Range: NONE DETECTED  POSITIVE (A)   Barbiturates Latest Range: NONE DETECTED  NONE DETECTED   Benzodiazepines Latest Range: NONE DETECTED  NONE DETECTED   Opiates Latest Range: NONE DETECTED  POSITIVE (A)   COCAINE Latest Range: NONE DETECTED  NONE DETECTED   Tetrahydrocannabinol Latest Range: NONE DETECTED  NONE DETECTED     Results for SHAKARI, QAZI (MRN [id number]) as of 07/24/2013 10:52  Ref. Range 07/23/2013 21:10 07/24/2013 00:00 07/24/2013 07:30  Troponin I Latest Range: <0.30  ng/mL <0.30 <0.30 <0.30  Pro B Natriuretic peptide (BNP) Latest Range: 0-125 pg/mL 12.6       Results for orders placed during the hospital encounter of 07/23/13 (from the past 24 hour(s))  URINE RAPID DRUG SCREEN (HOSP PERFORMED)     Status: Abnormal   Collection Time    07/24/13  6:19 PM      Result Value Ref Range   Opiates POSITIVE (*) NONE DETECTED   Cocaine NONE DETECTED  NONE DETECTED   Benzodiazepines NONE DETECTED  NONE DETECTED   Amphetamines POSITIVE (*) NONE DETECTED   Tetrahydrocannabinol NONE DETECTED  NONE DETECTED   Barbiturates NONE DETECTED  NONE DETECTED  PLATELET INHIBITION P2Y12     Status: Abnormal   Collection Time    07/24/13  8:20 PM      Result Value Ref Range   Platelet Function  P2Y12 175 (*) 194 - 418 PRU  MAGNESIUM     Status: None   Collection Time    07/25/13  2:15 AM      Result Value Ref Range   Magnesium 1.8  1.5 - 2.5 mg/dL  BASIC METABOLIC PANEL     Status: Abnormal   Collection Time    07/25/13  2:15 AM      Result Value Ref Range  Sodium 140  137 - 147 mEq/L   Potassium 3.9  3.7 - 5.3 mEq/L   Chloride 101  96 - 112 mEq/L   CO2 27  19 - 32 mEq/L   Glucose, Bld 148 (*) 70 - 99 mg/dL   BUN 12  6 - 23 mg/dL   Creatinine, Ser 0.71  0.50 - 1.10 mg/dL   Calcium 9.1  8.4 - 10.5 mg/dL   GFR calc non Af Amer >90  >90 mL/min   GFR calc Af Amer >90  >90 mL/min  PROTIME-INR     Status: None   Collection Time    07/25/13  2:15 AM      Result Value Ref Range   Prothrombin Time 13.2  11.6 - 15.2 seconds   INR 1.02  0.00 - 1.49    Signed: Cresenciano Genre, MD 07/25/2013, 1:46 PM   Time Spent on Discharge: >30 minutes Services Ordered on Discharge: none Equipment Ordered on Discharge: none

## 2013-07-24 NOTE — Progress Notes (Signed)
Patient seen, examined. Available data reviewed. Agree with findings, assessment, and plan as outlined by Dr Aundra Dubin. Exam reveals an overweight pleasant woman in no distress. Lungs are clear. Heart is regular rate and rhythm without murmur or gallop. Extremities show no edema. I have reviewed the patient's laboratory data, electrocardiogram, and radiographic data. Her extensive history was also carefully reviewed. She's had multiple cardiac catheterizations since her original PCI that have demonstrated nonobstructive disease. Most recently she underwent pressure wire analysis of moderate in-stent restenosis in the right coronary artery. This was entirely normal. She now has chest pain with typical and atypical features. I agree with a nuclear stress test for risk stratification. If her study is low risk I would anticipate discharge home later today.  Sherren Mocha, M.D. 07/24/2013 11:14 AM

## 2013-07-24 NOTE — Progress Notes (Signed)
[facility] Cardiology Progress Note   Subjective: Pt has h/o chronic chest pain.  States she had chest pressure since Friday evening 8/10 constant radiating to left neck, shoulder.  She tried to lie down, sleep, elevate legs, NTG, morphine which relieved pain a little.  CP was associated with sob and 3-4 episodes of sweating.  Denies nausea/vomiting.  Pt denies drugs.    RN: NSR; no tele events. Lexiscan at 9:30 am Objective: Vital signs in last 24 hours: Filed Vitals:   07/24/13 0400 07/24/13 0423 07/24/13 0500 07/24/13 0746  BP: 113/57   140/65  Pulse:      Temp:  97.2 F (36.2 C)    TempSrc:  Oral  Oral  Resp:    14  Height:      Weight:   207 lb 0.2 oz (93.9 kg)   SpO2:       Weight change:  No intake or output data in the 24 hours ending 07/24/13 0944 Vitals reviewed.HR 70s-80s, 113/57 (74) General: resting in bed asleep and arousable, NAD HEENT:[place]/at, no scleral icterus Cardiac: RRR, no rubs, murmurs or gallops Pulm: clear to auscultation bilaterally, no wheezes, rales, or rhonchi. Coughing intermittently  Abd: soft, nontender, nondistended, BS present, obese  Ext: warm and well perfused, no pedal edema Neuro: alert and oriented X3, cranial nerves II-XII grossly intact, moving all 4 extremities  Lab Results: Basic Metabolic Panel:  Recent Labs Lab 07/23/13 2110 07/24/13 0730  NA 144 147  K 3.4* 3.7  CL 104 108  CO2 24 29  GLUCOSE 147* 116*  BUN 14 11  CREATININE 0.66 0.61  CALCIUM 9.1 8.9  MG 1.8  --    Liver Function Tests:  Recent Labs Lab 07/24/13 0730  AST 27  ALT 21  ALKPHOS 78  BILITOT 0.2*  PROT 6.4  ALBUMIN 3.3*   No results found for this basename: LIPASE, AMYLASE,  in the last 168 hours No results found for this basename: AMMONIA,  in the last 168 hours CBC:  Recent Labs Lab 07/23/13 2110 07/24/13 0730  WBC 9.2 6.8  NEUTROABS 5.2  --   HGB 13.1 13.2  HCT 39.0 39.3  MCV 105.1* 105.9*  PLT 260 238   Cardiac  Enzymes:  Recent Labs Lab 07/23/13 2110 07/24/13 07/24/13 0730  TROPONINI <0.30 <0.30 <0.30   BNP:  Recent Labs Lab 07/23/13 2110  PROBNP 12.6   Thyroid Function Tests:  Recent Labs Lab 07/23/13 2110  TSH 4.421   Coagulation:  Recent Labs Lab 07/23/13 2110  LABPROT 12.8  INR 0.98   Urine Drug Screen: Drugs of Abuse     Component Value Date/Time   LABOPIA NONE DETECTED 02/28/2013 1934   COCAINSCRNUR NONE DETECTED 02/28/2013 1934   LABBENZ NONE DETECTED 02/28/2013 1934   AMPHETMU NONE DETECTED 02/28/2013 1934   THCU NONE DETECTED 02/28/2013 1934   LABBARB NONE DETECTED 02/28/2013 1934    Misc. Labs: CMET, CBC, UDS, trop  Micro Results: Recent Results (from the past 240 hour(s))  MRSA PCR SCREENING     Status: None   Collection Time    07/23/13  5:48 PM      Result Value Range Status   MRSA by PCR NEGATIVE  NEGATIVE Final   Comment:            The GeneXpert MRSA Assay (FDA     approved for NASAL specimens     only), is one component of a     comprehensive MRSA  colonization     surveillance program. It is not     intended to diagnose MRSA     infection nor to guide or     monitor treatment for     MRSA infections.   Studies/Results: No results found. Medications:  Medications Prior to Admission  Medication Sig Dispense Refill  . albuterol (PROVENTIL HFA;VENTOLIN HFA) 108 (90 BASE) MCG/ACT inhaler Inhale 2 puffs into the lungs every 4 (four) hours as needed for wheezing or shortness of breath.  1 Inhaler  5  . albuterol (PROVENTIL) (2.5 MG/3ML) 0.083% nebulizer solution Take 3 mLs (2.5 mg total) by nebulization every 6 (six) hours as needed for wheezing or shortness of breath.  140 mL  6  . aluminum-magnesium hydroxide-simethicone (MAALOX) 742-595-63 MG/5ML SUSP Take by mouth every 4 (four) hours as needed.      Marland Kitchen amLODipine (NORVASC) 2.5 MG tablet Take 1 tablet (2.5 mg total) by mouth daily. For hypertension  30 tablet  11  . ARIPiprazole (ABILIFY) 5 MG  tablet Take 1 tablet (5 mg total) by mouth daily at 8 pm. For mood control  30 tablet  0  . aspirin EC 81 MG tablet Take 1 tablet (81 mg total) by mouth every morning. For heart health      . atorvastatin (LIPITOR) 40 MG tablet Take 1 tablet (40 mg total) by mouth daily with supper. For high cholesterol control  30 tablet  3  . clopidogrel (PLAVIX) 75 MG tablet Take 1 tablet (75 mg total) by mouth daily with supper. For heart condition  30 tablet  3  . dicyclomine (BENTYL) 10 MG capsule Take 10 mg by mouth every 6 (six) hours as needed for spasms.      . DULoxetine (CYMBALTA) 60 MG capsule Take 1 capsule (60 mg total) by mouth daily. For depression  30 capsule  0  . esomeprazole (NEXIUM) 40 MG capsule Take 40 mg by mouth 2 (two) times daily before a meal. For acid reflux      . levothyroxine (SYNTHROID, LEVOTHROID) 50 MCG tablet Take 1 tablet (50 mcg total) by mouth daily before breakfast. For low thyroid hormone      . losartan (COZAAR) 50 MG tablet Take 1 tablet (50 mg total) by mouth daily. For hypertension  30 tablet  3  . Multiple Vitamins-Minerals (MULTIVITAMIN WITH MINERALS) tablet Take 1 tablet by mouth every morning. For low vitamin      . nitroGLYCERIN (NITROSTAT) 0.4 MG SL tablet Place 1 tablet (0.4 mg total) under the tongue every 5 (five) minutes as needed for chest pain.  25 tablet  3  . traZODone (DESYREL) 300 MG tablet Take 1 tablet (300 mg total) by mouth at bedtime. For sleep  30 tablet  0   Scheduled Meds: . amLODipine  2.5 mg Oral Daily  . ARIPiprazole  5 mg Oral Q2000  . aspirin EC  81 mg Oral Daily  . atorvastatin  40 mg Oral Q supper  . clopidogrel  75 mg Oral Q supper  . DULoxetine  60 mg Oral Daily  . enoxaparin (LOVENOX) injection  40 mg Subcutaneous Q24H  . levothyroxine  50 mcg Oral QAC breakfast  . losartan  50 mg Oral Daily  . pantoprazole  80 mg Oral BID  . regadenoson  0.4 mg Intravenous Once  . sodium chloride  3 mL Intravenous Q12H  . traZODone  300 mg Oral  QHS    Continuous Infusions:  PRN Meds:.sodium chloride, acetaminophen, albuterol,  albuterol, dicyclomine, morphine injection, nitroGLYCERIN, ondansetron (ZOFRAN) IV, sodium chloride Assessment/Plan: 58 y.o PMH CAD s/p BMS (RCA and LCX 2008; ISR of RCA tx'ed with Xience DES 12/09, cath 04/2009 and 05/2011 and 02/2013 nonobstructive caths), chronic chest pain, anxiety/depression, h/o suicide attempt, HTN, obesity, h/o tobacco and alcohol abuse, GERD, Barretts esophagus, hiatal hernia.  She presented 2/9 with chest pain radiating to left neck/shoulder pain.  Sensations felt like prior heart attack and were minimally relieved with NTG. She also had associated sweating.    #Atypical Chest pain with moderate risk for cardiac etiology -pt has a history of chronic chest pain and unstable angina.  Will r/o ACS though this could be noncardiac etiology (i.e GI). timi score of at least 3=moderate risk  -LHC (02/22/13): mid OM1 stent patent with 20-30% proximal to the OM stent, mid RCA stent patent with 50-60% ISR, EF 60-65%. Continued medical therapy was recommended.Left heart cath 05/2011 with patent stent in RCA with 60%-65% ISR (FFR 0.98 - not flow limiting) and non-obstructive disease elsewhere -trop negative x 2 -pending am EKG -pending Nuclear stress test. Normal nuclear stress test 02/2011 with EF 70% -morphine 2mg  q3 hours, prn NTG, prn Zofran  -In the past pt had no benefit from Ranexa.  Cp seems to be better on Norvasc  -pending UDS  -continue medical therapy Aspirin 81 mg, Lipitor 40 mg qd, Plavix 75 mg   #CAD s/p stents  -Aspirin 81 mg, Lipitor 40 mg qd, Plavix 75 mg   #Dyslipidemia     Component Value Date/Time   CHOL 145 02/22/2013 0450   TRIG 116 02/22/2013 0450   HDL 51 02/22/2013 0450   CHOLHDL 2.8 02/22/2013 0450   VLDL 23 02/22/2013 0450   LDLCALC 71 02/22/2013 0450   -on statin   #HYPERTENSION -Norvasc 2.5 mg, Cozaar 50 mg qd   #Psychiatric disorder  -Abilify 5 mg qd, Cymbalta  60 mg qd, Trazadone 300 mg qd   #Tobacco abuse  -smoking cessation  -pt quit smoking 2.5 years ago   #Asthma, mild intermittent -stable, Albuterol neb  #GERD/Barretts esophagus/hiatal hernia -she follows with GI in [place] and had recent upper endoscopy -Protonix 80 mg bid   Hypothyroidism  -Synthroid 50 mcg  #F/E/N -NSL -Hypokalemia noted on 2/9. Pending am labs CMET, Mag -NPO  DVT #px  -Lovenox    Cardiologist: Dr. Percival Spanish PCP: Dr. Lovette Cliche [place]      LOS: 1 day   Cresenciano Genre, MD [phone] 07/24/2013, 9:44 AM

## 2013-07-25 ENCOUNTER — Encounter (HOSPITAL_COMMUNITY)
Admission: AD | Disposition: A | Discharge status: Home / Self Care | Payer: Self-pay | Source: Other Acute Inpatient Hospital | Attending: Cardiology

## 2013-07-25 DIAGNOSIS — K2270 Barrett's esophagus without dysplasia: Secondary | ICD-10-CM | POA: Diagnosis not present

## 2013-07-25 DIAGNOSIS — I2510 Atherosclerotic heart disease of native coronary artery without angina pectoris: Secondary | ICD-10-CM

## 2013-07-25 DIAGNOSIS — R079 Chest pain, unspecified: Secondary | ICD-10-CM | POA: Diagnosis present

## 2013-07-25 DIAGNOSIS — R9439 Abnormal result of other cardiovascular function study: Secondary | ICD-10-CM

## 2013-07-25 DIAGNOSIS — Z87891 Personal history of nicotine dependence: Secondary | ICD-10-CM | POA: Diagnosis not present

## 2013-07-25 DIAGNOSIS — E785 Hyperlipidemia, unspecified: Secondary | ICD-10-CM | POA: Diagnosis not present

## 2013-07-25 DIAGNOSIS — J45909 Unspecified asthma, uncomplicated: Secondary | ICD-10-CM | POA: Diagnosis not present

## 2013-07-25 DIAGNOSIS — E669 Obesity, unspecified: Secondary | ICD-10-CM | POA: Diagnosis not present

## 2013-07-25 DIAGNOSIS — R0789 Other chest pain: Secondary | ICD-10-CM | POA: Diagnosis not present

## 2013-07-25 DIAGNOSIS — I10 Essential (primary) hypertension: Secondary | ICD-10-CM | POA: Diagnosis not present

## 2013-07-25 DIAGNOSIS — Z9861 Coronary angioplasty status: Secondary | ICD-10-CM | POA: Diagnosis not present

## 2013-07-25 DIAGNOSIS — E876 Hypokalemia: Secondary | ICD-10-CM | POA: Diagnosis not present

## 2013-07-25 DIAGNOSIS — E039 Hypothyroidism, unspecified: Secondary | ICD-10-CM | POA: Diagnosis not present

## 2013-07-25 DIAGNOSIS — Z7902 Long term (current) use of antithrombotics/antiplatelets: Secondary | ICD-10-CM | POA: Diagnosis not present

## 2013-07-25 DIAGNOSIS — F319 Bipolar disorder, unspecified: Secondary | ICD-10-CM | POA: Diagnosis not present

## 2013-07-25 HISTORY — PX: LEFT HEART CATHETERIZATION WITH CORONARY ANGIOGRAM: SHX5451

## 2013-07-25 LAB — BASIC METABOLIC PANEL
BUN: 12 mg/dL (ref 6–23)
CALCIUM: 9.1 mg/dL (ref 8.4–10.5)
CO2: 27 mEq/L (ref 19–32)
Chloride: 101 mEq/L (ref 96–112)
Creatinine, Ser: 0.71 mg/dL (ref 0.50–1.10)
GLUCOSE: 148 mg/dL — AB (ref 70–99)
Potassium: 3.9 mEq/L (ref 3.7–5.3)
Sodium: 140 mEq/L (ref 137–147)

## 2013-07-25 LAB — PROTIME-INR
INR: 1.02 (ref 0.00–1.49)
Prothrombin Time: 13.2 seconds (ref 11.6–15.2)

## 2013-07-25 LAB — MAGNESIUM: Magnesium: 1.8 mg/dL (ref 1.5–2.5)

## 2013-07-25 SURGERY — LEFT HEART CATHETERIZATION WITH CORONARY ANGIOGRAM
Anesthesia: LOCAL

## 2013-07-25 MED ORDER — ACETAMINOPHEN 325 MG PO TABS: 650.0000 mg | ORAL_TABLET | ORAL | Status: DC | PRN

## 2013-07-25 MED ORDER — NITROGLYCERIN 0.2 MG/ML ON CALL CATH LAB
INTRAVENOUS | Status: AC
  Filled 2013-07-25: qty 1

## 2013-07-25 MED ORDER — METOPROLOL TARTRATE 25 MG PO TABS: 25.0000 mg | ORAL_TABLET | Freq: Two times a day (BID) | ORAL | Status: DC

## 2013-07-25 MED ORDER — VERAPAMIL HCL 2.5 MG/ML IV SOLN
INTRAVENOUS | Status: AC
  Filled 2013-07-25: qty 2

## 2013-07-25 MED ORDER — GI COCKTAIL ~~LOC~~
30.0000 mL | Freq: Once | ORAL | Status: DC | PRN
  Filled 2013-07-25: qty 30

## 2013-07-25 MED ORDER — IBUPROFEN 800 MG PO TABS
800.0000 mg | ORAL_TABLET | Freq: Once | ORAL | Status: DC
  Filled 2013-07-25: qty 1

## 2013-07-25 MED ORDER — MIDAZOLAM HCL 2 MG/2ML IJ SOLN
INTRAMUSCULAR | Status: AC
  Filled 2013-07-25: qty 2

## 2013-07-25 MED ORDER — LIDOCAINE HCL (PF) 1 % IJ SOLN
INTRAMUSCULAR | Status: AC
Start: 2013-07-25 — End: 2013-07-25
  Filled 2013-07-25: qty 30

## 2013-07-25 MED ORDER — ONDANSETRON HCL 4 MG/2ML IJ SOLN: 4.0000 mg | Freq: Four times a day (QID) | INTRAMUSCULAR | Status: DC | PRN

## 2013-07-25 MED ORDER — FENTANYL CITRATE 0.05 MG/ML IJ SOLN
INTRAMUSCULAR | Status: AC
  Filled 2013-07-25: qty 2

## 2013-07-25 MED ORDER — HEPARIN (PORCINE) IN NACL 2-0.9 UNIT/ML-% IJ SOLN
INTRAMUSCULAR | Status: AC
  Filled 2013-07-25: qty 1500

## 2013-07-25 MED ORDER — HEPARIN SODIUM (PORCINE) 1000 UNIT/ML IJ SOLN
INTRAMUSCULAR | Status: AC
  Filled 2013-07-25: qty 1

## 2013-07-25 MED ORDER — ADENOSINE 12 MG/4ML IV SOLN
16.0000 mL | Freq: Once | INTRAVENOUS | Status: DC
  Filled 2013-07-25: qty 16

## 2013-07-25 NOTE — Discharge Summary (Signed)
Reviewed cath films with Dr Tamala Julian. I agree with med management and do not think CAD explains her resting chest pain. Will continue med Rx as outlined by Dr Aundra Dubin. D/C home with follow-up as planned.

## 2013-07-25 NOTE — H&P (View-Only) (Signed)
[facility] Card[facility] Progress Note   Subjective: Pt states CP is 7/10.  She has abrasion from BP cuff left arm  RN: no events. UDS + amphetamines  Objective: Vital signs in last 24 hours: Filed Vitals:   07/25/13 0000 07/25/13 0357 07/25/13 0419 07/25/13 0731  BP: 129/59 115/62  127/65  Pulse:      Temp:  97.8 F (36.6 C)  98 F (36.7 C)  TempSrc:  Oral  Oral  Resp: 18 16  14   Height:      Weight:   210 lb 15.7 oz (95.7 kg)   SpO2: 99% 97%     Weight change: 0 lb (0 kg)  Intake/Output Summary (Last 24 hours) at 07/25/13 1027 Last data filed at 07/25/13 0700  Gross per 24 hour  Intake    940 ml  Output      0 ml  Net    940 ml   Vitals reviewed.HR 60, 127/65 (87) General: resting in bed asleep and arousable, NAD HEENT:[place]/at, no scleral icterus Cardiac: RRR, no rubs, murmurs or gallops Pulm: clear to auscultation bilaterally, no wheezes, rales, or rhonchi.  Abd: soft, nontender, nondistended, BS present, obese  Ext: warm and well perfused, no pedal edema Neuro: alert and oriented X3, cranial nerves II-XII grossly intact, moving all 4 extremities  Lab Results: Basic Metabolic Panel:  Recent Labs Lab 07/23/13 2110 07/24/13 0730 07/25/13 0215  NA 144 147 140  K 3.4* 3.7 3.9  CL 104 108 101  CO2 24 29 27   GLUCOSE 147* 116* 148*  BUN 14 11 12   CREATININE 0.66 0.61 0.71  CALCIUM 9.1 8.9 9.1  MG 1.8  --  1.8   Liver Function Tests:  Recent Labs Lab 07/24/13 0730  AST 27  ALT 21  ALKPHOS 78  BILITOT 0.2*  PROT 6.4  ALBUMIN 3.3*   CBC:  Recent Labs Lab 07/23/13 2110 07/24/13 0730  WBC 9.2 6.8  NEUTROABS 5.2  --   HGB 13.1 13.2  HCT 39.0 39.3  MCV 105.1* 105.9*  PLT 260 238   Cardiac Enzymes:  Recent Labs Lab 07/23/13 2110 07/24/13 07/24/13 0730  TROPONINI <0.30 <0.30 <0.30   BNP:  Recent Labs Lab 07/23/13 2110  PROBNP 12.6   Thyroid Function Tests:  Recent Labs Lab 07/23/13 2110  TSH 4.421    Coagulation:  Recent Labs Lab 07/23/13 2110 07/24/13 1018 07/25/13 0215  LABPROT 12.8 11.9 13.2  INR 0.98 0.89 1.02   Urine Drug Screen: Drugs of Abuse     Component Value Date/Time   LABOPIA POSITIVE* 07/24/2013 1819   COCAINSCRNUR NONE DETECTED 07/24/2013 1819   LABBENZ NONE DETECTED 07/24/2013 1819   AMPHETMU POSITIVE* 07/24/2013 1819   THCU NONE DETECTED 07/24/2013 1819   LABBARB NONE DETECTED 07/24/2013 1819    Misc. Labs: none Micro Results: Recent Results (from the past 240 hour(s))  MRSA PCR SCREENING     Status: None   Collection Time    07/23/13  5:48 PM      Result Value Ref Range Status   MRSA by PCR NEGATIVE  NEGATIVE Final   Comment:            The GeneXpert MRSA Assay (FDA     approved for NASAL specimens     only), is one component of a     comprehensive MRSA colonization     surveillance program. It is not     intended to diagnose MRSA  infection nor to guide or     monitor treatment for     MRSA infections.   Studies/Results: Nm Myocar Multi W/spect W/wall Motion / Ef  07/24/2013   CLINICAL DATA:  Chest pain  EXAM: Lexiscan Myoview  TECHNIQUE: The patient received IV Lexiscan .4mg  over 15 seconds. 33.0 mCi of Technetium 4m Sestamibi injected at 30 seconds. Quantitative SPECT images were obtained in the vertical, horizontal and short axis planes after a 45 minute delay. Rest images were obtained with similar planes and delay using 10.2 mCi of Technetium 63m Sestamibi.  FINDINGS: ECG: SR normal  Symptoms:  Dyspnea  RAW Data:  Motion  QPS: 3  Quantitative Gated SPECT EF:  44% apical and lateral hypokinesis  Perfusion Images:  Lateral wall ischemia at mid and apical level  IMPRESSION: Abnormal myovue with lateral wall ischemia at mid and apical level EF down at 44% see above  Discussed with Dr Dorena Dew   Electronically Signed   By: Jenkins Rouge M.D.   On: 07/24/2013 14:08   Medications:  Medications Prior to Admission  Medication Sig  Dispense Refill  . albuterol (PROVENTIL HFA;VENTOLIN HFA) 108 (90 BASE) MCG/ACT inhaler Inhale 2 puffs into the lungs every 4 (four) hours as needed for wheezing or shortness of breath.  1 Inhaler  5  . albuterol (PROVENTIL) (2.5 MG/3ML) 0.083% nebulizer solution Take 3 mLs (2.5 mg total) by nebulization every 6 (six) hours as needed for wheezing or shortness of breath.  140 mL  6  . amLODipine (NORVASC) 2.5 MG tablet Take 1 tablet (2.5 mg total) by mouth daily. For hypertension  30 tablet  11  . aspirin EC 81 MG tablet Take 1 tablet (81 mg total) by mouth every morning. For heart health      . atorvastatin (LIPITOR) 40 MG tablet Take 1 tablet (40 mg total) by mouth daily with supper. For high cholesterol control  30 tablet  3  . clopidogrel (PLAVIX) 75 MG tablet Take 1 tablet (75 mg total) by mouth daily with supper. For heart condition  30 tablet  3  . dicyclomine (BENTYL) 10 MG capsule Take 10 mg by mouth every 6 (six) hours as needed (abdominal pain).      . DULoxetine (CYMBALTA) 60 MG capsule Take 1 capsule (60 mg total) by mouth daily. For depression  30 capsule  0  . esomeprazole (NEXIUM) 40 MG capsule Take 40 mg by mouth 2 (two) times daily before a meal. For acid reflux      . levothyroxine (SYNTHROID, LEVOTHROID) 50 MCG tablet Take 1 tablet (50 mcg total) by mouth daily before breakfast. For low thyroid hormone      . losartan (COZAAR) 50 MG tablet Take 1 tablet (50 mg total) by mouth daily. For hypertension  30 tablet  3  . Multiple Vitamins-Minerals (MULTIVITAMIN WITH MINERALS) tablet Take 1 tablet by mouth every morning. For low vitamin      . nitroGLYCERIN (NITROSTAT) 0.4 MG SL tablet Place 1 tablet (0.4 mg total) under the tongue every 5 (five) minutes as needed for chest pain.  25 tablet  3  . traZODone (DESYREL) 300 MG tablet Take 1 tablet (300 mg total) by mouth at bedtime. For sleep  30 tablet  0   Scheduled Meds: . amLODipine  2.5 mg Oral Daily  . ARIPiprazole  5 mg Oral Q2000   . aspirin EC  81 mg Oral Daily  . atorvastatin  40 mg Oral Q supper  .  clopidogrel  75 mg Oral Q supper  . diazepam  5 mg Oral On Call  . docusate sodium  100 mg Oral BID  . DULoxetine  60 mg Oral Daily  . enoxaparin (LOVENOX) injection  40 mg Subcutaneous Q24H  . levothyroxine  50 mcg Oral QAC breakfast  . losartan  50 mg Oral Daily  . pantoprazole  80 mg Oral BID  . sodium chloride  3 mL Intravenous Q12H  . sodium chloride  3 mL Intravenous Q12H  . traZODone  300 mg Oral QHS    Continuous Infusions: . sodium chloride 100 mL/hr at 07/25/13 0419   PRN Meds:.sodium chloride, sodium chloride, acetaminophen, albuterol, albuterol, dicyclomine, morphine injection, nitroGLYCERIN, ondansetron (ZOFRAN) IV, sodium chloride, sodium chloride Assessment/Plan: 58 y.o PMH CAD s/p BMS (RCA and LCX 2008; ISR of RCA tx'ed with Xience DES 12/09, cath 04/2009 and 05/2011 and 02/2013 nonobstructive caths), chronic chest pain, anxiety/depression, h/o suicide attempt, HTN, obesity, h/o tobacco and alcohol abuse, GERD, Barretts esophagus, hiatal hernia.  She presented 2/9 with chest pain radiating to left neck/shoulder pain.  Sensations felt like prior heart attack and were minimally relieved with NTG. She also had associated sweating.    #Atypical Chest pain with moderate risk for cardiac etiology -pt has a history of chronic chest pain and unstable angina. Timi score of at least 3=moderate risk  -LHC (02/22/13): mid OM1 stent patent with 20-30% proximal to the OM stent, mid RCA stent patent with 50-60% ISR, EF 60-65%. Continued medical therapy was recommended.Left heart cath 05/2011 with patent stent in RCA with 60%-65% ISR (FFR 0.98 - not flow limiting) and non-obstructive disease elsewhere -trop negative x 3 -2/10 Nuclear stress test abnormal myovue with lateral wall ischemia at mid and apical level EF down at 44% see above . Normal nuclear stress test 02/2011 with EF 70% -morphine 2mg  q3 hours, prn NTG, prn  Zofran  -In the past pt had no benefit from Ranexa.  Cp seems to be better on Norvasc  -UDS + amphetamines and opiates  -continue medical therapy Aspirin 81 mg, Lipitor 40 mg qd, Plavix 75 mg  -will get cath at 12 PM today d/t abnormal nuc test 2/10  #CAD s/p stents  -Aspirin 81 mg, Lipitor 40 mg qd, Plavix 75 mg   #Dyslipidemia     Component Value Date/Time   CHOL 145 02/22/2013 0450   TRIG 116 02/22/2013 0450   HDL 51 02/22/2013 0450   CHOLHDL 2.8 02/22/2013 0450   VLDL 23 02/22/2013 0450   LDLCALC 71 02/22/2013 0450   -on statin   #HYPERTENSION -Norvasc 2.5 mg, Cozaar 50 mg qd   #Psychiatric disorder  -Abilify 5 mg qd, Cymbalta 60 mg qd, Trazadone 300 mg qd   #Tobacco abuse  -smoking cessation  -pt quit smoking 2.5 years ago   #Asthma, mild intermittent -stable, Albuterol neb  #GERD/Barretts esophagus/hiatal hernia -she follows with GI in [place] and had recent upper endoscopy -Protonix 80 mg bid   Hypothyroidism  -Synthroid 50 mcg  #F/E/N -NSL -electrolytes wnl -NPO  DVT #px  -Lovenox    Cardiologist: Dr. Percival Spanish PCP: Dr. Lovette Cliche [place]       LOS: 2 days   Cresenciano Genre, MD [phone] 07/25/2013, 10:27 AM

## 2013-07-25 NOTE — H&P (View-Only) (Signed)
Patient seen, examined. Available data reviewed. Agree with findings, assessment, and plan as outlined by Dr Aundra Dubin. Exam reveals anxious but alert, oriented woman in NAD. Lungs CTA, heart RRR without murmur, no peripheral edema. Updated labs reviewed. Myoview showed lateral ischemia and depressed LVEF. Plan cardiac cath +/- PCI for evaluation and treatment of this patient who has unstable angina with an abnormal Myoview scan (moderate risk). If cath shows stable anatomy, I think she can be discharged on current medical therapy.  Sherren Mocha, M.D. 07/25/2013 11:48 AM

## 2013-07-25 NOTE — Discharge Summary (Signed)
Name: Kara Lamb MRN: [id number] DOB: Sep 10, 1955 58 y.o. PCP: Kara Sheriff, MD  Date of Admission: 07/23/2013  5:23 PM Date of Discharge: 07/25/2013 Attending Physician: Leonie Man, MD, Dr. Sherren Mocha   Discharge Diagnosis: 1. Atypical chest pain with moderate risk for cardiac etiology with CAD history 2. CAD s/p stents  3. Dyslipidemia  4. HYPERTENSION  5. Psychiatric disorder  6. Tobacco abuse quit >2 years ago 7. Asthma, mild intermittent  8. GERD/Barretts esophagus/hiatal hernia  9. Hypothyroidism  10. Hypokalemia, resolved  Discharge Medications:   Medication List         albuterol (2.5 MG/3ML) 0.083% nebulizer solution  Commonly known as:  PROVENTIL  Take 3 mLs (2.5 mg total) by nebulization every 6 (six) hours as needed for wheezing or shortness of breath.     albuterol 108 (90 BASE) MCG/ACT inhaler  Commonly known as:  PROVENTIL HFA;VENTOLIN HFA  Inhale 2 puffs into the lungs every 4 (four) hours as needed for wheezing or shortness of breath.     amLODipine 2.5 MG tablet  Commonly known as:  NORVASC  Take 1 tablet (2.5 mg total) by mouth daily. For hypertension     aspirin EC 81 MG tablet  Take 1 tablet (81 mg total) by mouth every morning. For heart health     atorvastatin 40 MG tablet  Commonly known as:  LIPITOR  Take 1 tablet (40 mg total) by mouth daily with supper. For high cholesterol control     clopidogrel 75 MG tablet  Commonly known as:  PLAVIX  Take 1 tablet (75 mg total) by mouth daily with supper. For heart condition     dicyclomine 10 MG capsule  Commonly known as:  BENTYL  Take 10 mg by mouth every 6 (six) hours as needed (abdominal pain).     DULoxetine 60 MG capsule  Commonly known as:  CYMBALTA  Take 1 capsule (60 mg total) by mouth daily. For depression     esomeprazole 40 MG capsule  Commonly known as:  NEXIUM  Take 40 mg by mouth 2 (two) times daily before a meal. For acid reflux     levothyroxine 50 MCG  tablet  Commonly known as:  SYNTHROID, LEVOTHROID  Take 1 tablet (50 mcg total) by mouth daily before breakfast. For low thyroid hormone     losartan 50 MG tablet  Commonly known as:  COZAAR  Take 1 tablet (50 mg total) by mouth daily. For hypertension     metoprolol tartrate 25 MG tablet  Commonly known as:  LOPRESSOR  Take 1 tablet (25 mg total) by mouth 2 (two) times daily.     multivitamin with minerals tablet  Take 1 tablet by mouth every morning. For low vitamin     nitroGLYCERIN 0.4 MG SL tablet  Commonly known as:  NITROSTAT  Place 1 tablet (0.4 mg total) under the tongue every 5 (five) minutes as needed for chest pain.     trazodone 300 MG tablet  Commonly known as:  DESYREL  Take 1 tablet (300 mg total) by mouth at bedtime. For sleep        Disposition and follow-up:   [name] was discharged from [facility] in stable condition.  At the hospital follow up visit please address:  1.   -Repeat BMET -Assess chest pain.  Unclear why patient is not on beta blocker. We started Lopressor 25 mg bid this admission -Make sure patient follows  with Gastroenterology.    2.  Labs / imaging needed at time of follow-up: none   3.  Pending labs/ test needing follow-up: none   Follow-up Appointments:   Discharge Instructions: Discharge Orders   Future Appointments Provider Department Dept Phone   08/14/2013 11:45 AM Minus Breeding, MD [facility] [phone]   Future Orders Complete By Expires   Diet - low sodium heart healthy  As directed    Diet - low sodium heart healthy  As directed    Discharge instructions  As directed    Comments:     Follow up with [facility] as scheduled [phone]   Follow up with your Gastroenterologist   Read all the information given  Take care   Discharge instructions  As directed    Comments:     Follow up with cardiology as scheduled   Follow up with gastroenterology   Take  medications as instructed   Take care   Increase activity slowly  As directed    Increase activity slowly  As directed       Consultations: [facility]. Dr. Ellyn Hack, Dr. Burt Knack, Dr. Tamala Julian  Procedures Performed:  07/24/13 Carlton Adam results  FINDINGS:  ECG: SR normal  Symptoms: Dyspnea  RAW Data: Motion  QPS: 3  Quantitative Gated SPECT EF: 44% apical and lateral hypokinesis  Perfusion Images: Lateral wall ischemia at mid and apical level  IMPRESSION:  Abnormal myovue with lateral wall ischemia at mid and apical level  EF down at 44% see above  2D Echo: no recent   Cardiac Cath:   07/25/13 INDICATIONS: Lateral ischemia on nuclear study. History of prior stents. Having pain at rest. No evidence of infarction or ischemia on EKG to  PROCEDURE: 1. Left heart catheterization; 2. Coronary angiography; 3. Left ventriculography  CONSENT:  The risks, benefits, and details of the procedure were explained in detail to the patient. Risks including death, stroke, heart attack, kidney injury, allergy, limb ischemia, bleeding and radiation injury were discussed. The patient verbalized understanding and wanted to proceed. Informed written consent was obtained.  PROCEDURE TECHNIQUE: After Xylocaine anesthesia a 5 French Slender sheath was placed in the right radial artery with a single double wall needle stick. Coronary angiography was done using a 5 [place] JR 4 and JL 3.5 catheter. Left ventriculography was done using the JR 4 catheter and hand injection.  Hemostasis was achieved with a wrist band without complications.  MEDICATIONS: 3 mg IV Versed, 100 mcg of fentanyl  CONTRAST: Total of 90 cc.  COMPLICATIONS: None  HEMODYNAMICS: Aortic pressure 138/73 mmHg; LV pressure 45/0 mmHg; LVEDP 14 mm mercury  ANGIOGRAPHIC DATA: The left main coronary artery is widely patent.  The left anterior descending artery is transapical and free of significant obstruction. The mid vessel  contains eccentric 50-60% narrowing. The proximal vessel contains eccentric 30-40% narrowing..  The left circumflex artery is widely patent. The previously placed mid vessel stent is without evidence of significant obstruction. The stent in his proximal to a distal bifurcation into the second and third obtuse marginal. Both branches have ostial narrowing similar to 2008 following stent implantation. The more distal branch contains ostial 60-75% narrowing.  The right coronary artery is dominant. The posterior descending and zone the inferoapical segment. The mid vessel contains overlapping stents. The more distally placed stent contains diffuse 40-50% ISR commencing below the first acute marginal branch. This region of ISR is not felt to be clinically/hemodynamically significant.  The distal right coronary contains luminal irregularities.Marland Kitchen  LEFT VENTRICULOGRAM: Left ventricular angiogram was done in the 30 RAO projection and revealed symmetrical contractility with EF 55%.  IMPRESSIONS: 1. Widely patent coronary arteries without significant change in appearance when compared to the most recent images from 2011. There is mild to moderate ISR in the mid RCA, 50-70% stenosis in a branch of the circumflex beyond the mid stent, and 50% mid LAD narrowing.  2. Overall normal LV function  3. Unable to identify a coronary obstructive abnormality that would account for mid to apical lateral wall ischemia. The possibility of dynamic obstruction (coronary vasoconstriction) during the myocardial perfusion study would be a consideration.  RECOMMENDATION: Medical therapy with continued aggressive risk factor modification images and treatment plan reviewed with Dr. Burt Knack.   Admission HPI: 58 y.o PMH CAD s/p BMS (RCA and LCX 2008; ISR of RCA tx'ed with Xience DES 12/09, cath 04/2009 and 05/2011 and 02/2013 nonobstructive caths), chronic chest pain, anxiety/depression, h/o suicide attempt, HTN, obesity, h/o tobacco and  alcohol abuse, GERD, Barretts esophagus, hiatal hernia. She presented 2/9 with chest pain/pressure (8/10) radiating to left neck/shoulder pain since Friday prior to admission. Sensations felt like prior heart attack and were minimally relieved with NTG, morphine. She also had associated sob and sweating x 3-4 episodes.   She denied nausea/vomiting.  She tried to lie down, sleep and elevate her legs with little relief as well.  EKG has been normal sinus rhythm without acute changes.   ROS: Full 14 point review of systems complete and found to be negative unless listed above.  Physical Exam:  Blood pressure 113/64, pulse 81, temperature 97.6 F (36.4 C), temperature source Oral, resp. rate 21, height 5\' 1"  (1.549 m), weight 210 lb 15.7 oz (95.7 kg), SpO2 96.00%.  General: Well developed, well nourished, female in no acute distress, obese  Head: Eyes PERRLA, No xanthomas. Normocephalic and atraumatic, oropharynx without edema or exudate. Dentition:  Lungs: Diffuse wheezes and expiatory cough  Heart: HRRR S1 S2, no rub/gallop, Heart irregular rate and rhythm with S1, S2 murmur. pulses are 2+ extrem.  Neck: No carotid bruits. No lymphadenopathy. JVD.  Abdomen: Bowel sounds present, abdomen soft and non-tender without masses or hernias noted.  Msk: No spine or cva tenderness. No weakness, no joint deformities or effusions.  Extremities: No clubbing or cyanosis. No edema.  Neuro: Alert and oriented X 3. No focal deficits noted.  Psych: Good affect, responds appropriately  Skin: No rashes or lesions noted.   Hospital Course by problem list: 1. Atypical chest pain with moderate risk for cardiac etiology with CAD history 2. CAD s/p stents  3. Dyslipidemia  4. HYPERTENSION  5. Psychiatric disorder  6. Tobacco abuse quit > 2 years ago  7. Asthma, mild intermittent  8. GERD/Barretts esophagus/hiatal hernia  9. Hypothyroidism  10. Hypokalemia, resolved  1. Atypical chest pain with moderate risk for  cardiac etiology with CAD history She has a history of chronic atypical chest pain and unstable angina. We ruled out ACS though this could be noncardiac etiology (i.e GI) with history. Timi score of at least 3=moderate risk. CAD s/p BMS (RCA and LCX 2008; ISR of RCA tx'ed with Xience DES 12/09, cath 04/2009 and 05/2011 and 02/2013 nonobstructive caths). Prior work ups include LHC (02/22/13): mid OM1 stent patent with 20-30% proximal to the OM stent, mid RCA stent patent with 50-60% in stent restenosis, EF 60-65%. Continued medical therapy was recommended.  Left heart cath 05/2011 with patent  stent in RCA with 60%-65% ISR (FFR 0.98 - not flow limiting) and non-obstructive disease elsewhere.  She has had at least 3 catherizations 04/2009, 05/2011 and 02/2013 with nonobstructive results.  Troponins were negative x 3.  EKG normal sinus rhythm without acute changes.  She had a Nuclear stress test this admission 07/24/13 that was abnormal with lateral wall ischemia at mid and apical level EF down at 44% see above. Normal nuclear stress test 02/2011 with EF 70%.  She was give Morphine 2mg  q3 hours, prn NTG, prn Zofran.  In the past she had no benefit from Ranexa. Prior notes indicate chest pain seems to be better on Norvasc.  It is unclear per chart review why the patient is not on a beta blocker but we started Lopressor 25 mg bid prior to discharge.  She had a heart catherization 07/25/13 by Dr. Tamala Julian.  Results above.  We continued medical therapy Aspirin 81 mg, Lipitor 40 mg qd, Plavix 75 mg.  The source of chest pain this admission is likely noncardiac based on findings.  We will not prescribe the patient narcotics.  She can try tylenol or Ibuprofen for pain plus continue home medical management.     2. CAD s/p stents  Continued Aspirin 81 mg, Lipitor 40 mg qd, Plavix 75 mg.    3. Dyslipidemia    Component  Value  Date/Time    CHOL  145  02/22/2013 0450    TRIG  116  02/22/2013 0450    HDL  51  02/22/2013 0450     CHOLHDL  2.8  02/22/2013 0450    VLDL  23  02/22/2013 0450    LDLCALC  71  02/22/2013 0450    -on statin   4. HYPERTENSION  BP controlled on Norvasc 2.5 mg, Cozaar 50 mg qd   5. Psychiatric disorder  History of anxiety/depression, suicide attempt.  Continued Abilify 5 mg qd (acutally discontinued so will not resume at discharge), Cymbalta 60 mg qd, Trazadone 300 mg qd.   6. Tobacco abuse  Smoking cessation.  Patient quit smoking 2.5 years ago   7. Asthma, mild intermittent  Stable, Albuterol nebulizer given this admission  8. GERD/Barretts esophagus/hiatal hernia  She follows with GI in [place] and had recent upper endoscopy 06/2013.  She was given Protonix 80 mg bid and takes Nexium at home  9. Hypothyroidism  Continued Synthroid 50 mcg   10. Hypokalemia, resolved  noted on 2/9.   11. DVT px  Lovenox   Cardiologist: Dr. Percival Spanish  PCP: Dr. Lovette Cliche Helena Delia    Discharge Vitals:   BP 121/66  Pulse 75  Temp(Src) 97.8 F (36.6 C) (Oral)  Resp 16  Ht 5\' 1"  (1.549 m)  Wt 210 lb 15.7 oz (95.7 kg)  BMI 39.88 kg/m2  SpO2 95%  Discharge physical exam:  General: resting in bed asleep and arousable, NAD  HEENT:[place]/at, no scleral icterus  Cardiac: RRR, no rubs, murmurs or gallops  Pulm: clear to auscultation bilaterally, no wheezes, rales, or rhonchi.  Abd: soft, nontender, nondistended, BS present, obese  Ext: warm and well perfused, no pedal edema  Neuro: alert and oriented X3, cranial nerves II-XII grossly intact, moving all 4 extremities    Discharge Labs:  Results for ANAIKA, SANTILLANO (MRN [id number]) as of 07/25/2013 13:26  Ref. Range 07/25/2013 02:15  Sodium Latest Range: 137-147 mEq/L 140  Potassium Latest Range: 3.7-5.3 mEq/L 3.9  Chloride Latest Range: 96-112 mEq/L 101  CO2 Latest Range:  19-32 mEq/L 27  BUN Latest Range: 6-23 mg/dL 12  Creatinine Latest Range: 0.50-1.10 mg/dL 0.71  Calcium Latest Range: 8.4-10.5 mg/dL 9.1  GFR calc non Af Amer Latest  Range: >90 mL/min >90  GFR calc Af Amer Latest Range: >90 mL/min >90  Glucose Latest Range: 70-99 mg/dL 148 (H)  Magnesium Latest Range: 1.5-2.5 mg/dL 1.8  Prothrombin Time Latest Range: 11.6-15.2 seconds 13.2  INR Latest Range: 0.00-1.49  1.02   Results for LUCIANNA, VERMILYEA (MRN [id number]) as of 07/24/2013 10:52  Ref. Range 07/24/2013 07:30  Sodium Latest Range: 137-147 mEq/L 147  Potassium Latest Range: 3.7-5.3 mEq/L 3.7  Chloride Latest Range: 96-112 mEq/L 108  CO2 Latest Range: 19-32 mEq/L 29  BUN Latest Range: 6-23 mg/dL 11  Creatinine Latest Range: 0.50-1.10 mg/dL 0.61  Calcium Latest Range: 8.4-10.5 mg/dL 8.9  GFR calc non Af Amer Latest Range: >90 mL/min >90  GFR calc Af Amer Latest Range: >90 mL/min >90  Glucose Latest Range: 70-99 mg/dL 116 (H)  Alkaline Phosphatase Latest Range: 39-117 U/L 78  Albumin Latest Range: 3.5-5.2 g/dL 3.3 (L)  AST Latest Range: 0-37 U/L 27  ALT Latest Range: 0-35 U/L 21  Total Protein Latest Range: 6.0-8.3 g/dL 6.4  Total Bilirubin Latest Range: 0.3-1.2 mg/dL 0.2 (L)   Results for JENYFER, KOVAL (MRN [id number]) as of 07/24/2013 10:52  Ref. Range 07/24/2013 07:30  WBC Latest Range: 4.0-10.5 K/uL 6.8  RBC Latest Range: 3.87-5.11 MIL/uL 3.71 (L)  Hemoglobin Latest Range: 12.0-15.0 g/dL 13.2  HCT Latest Range: 36.0-46.0 % 39.3  MCV Latest Range: 78.0-100.0 fL 105.9 (H)  MCH Latest Range: 26.0-34.0 pg 35.6 (H)  MCHC Latest Range: 30.0-36.0 g/dL 33.6  RDW Latest Range: 11.5-15.5 % 12.6  Platelets Latest Range: 150-400 K/uL 238  Results for DESMA, MUNAFO (MRN [id number]) as of 07/24/2013 10:52  Ref. Range 07/24/2013 10:18  Prothrombin Time Latest Range: 11.6-15.2 seconds 11.9  INR Latest Range: 0.00-1.49  0.89   Results for KEMARI, LOGA (MRN [id number]) as of 07/25/2013 13:26  Ref. Range 07/24/2013 18:19 07/24/2013 20:20  Platelet Function  P2Y12 Latest Range: 194-418 PRU  175 (L)  Amphetamines Latest Range: NONE DETECTED  POSITIVE (A)   Barbiturates  Latest Range: NONE DETECTED  NONE DETECTED   Benzodiazepines Latest Range: NONE DETECTED  NONE DETECTED   Opiates Latest Range: NONE DETECTED  POSITIVE (A)   COCAINE Latest Range: NONE DETECTED  NONE DETECTED   Tetrahydrocannabinol Latest Range: NONE DETECTED  NONE DETECTED     Results for ILARIA, LESIEUR (MRN [id number]) as of 07/24/2013 10:52  Ref. Range 07/23/2013 21:10 07/24/2013 00:00 07/24/2013 07:30  Troponin I Latest Range: <0.30 ng/mL <0.30 <0.30 <0.30  Pro B Natriuretic peptide (BNP) Latest Range: 0-125 pg/mL 12.6       Results for orders placed during the hospital encounter of 07/23/13 (from the past 24 hour(s))  URINE RAPID DRUG SCREEN (HOSP PERFORMED)     Status: Abnormal   Collection Time    07/24/13  6:19 PM      Result Value Ref Range   Opiates POSITIVE (*) NONE DETECTED   Cocaine NONE DETECTED  NONE DETECTED   Benzodiazepines NONE DETECTED  NONE DETECTED   Amphetamines POSITIVE (*) NONE DETECTED   Tetrahydrocannabinol NONE DETECTED  NONE DETECTED   Barbiturates NONE DETECTED  NONE DETECTED  PLATELET INHIBITION P2Y12     Status: Abnormal   Collection Time    07/24/13  8:20 PM      Result Value  Ref Range   Platelet Function  P2Y12 175 (*) 194 - 418 PRU  MAGNESIUM     Status: None   Collection Time    07/25/13  2:15 AM      Result Value Ref Range   Magnesium 1.8  1.5 - 2.5 mg/dL  BASIC METABOLIC PANEL     Status: Abnormal   Collection Time    07/25/13  2:15 AM      Result Value Ref Range   Sodium 140  137 - 147 mEq/L   Potassium 3.9  3.7 - 5.3 mEq/L   Chloride 101  96 - 112 mEq/L   CO2 27  19 - 32 mEq/L   Glucose, Bld 148 (*) 70 - 99 mg/dL   BUN 12  6 - 23 mg/dL   Creatinine, Ser 0.71  0.50 - 1.10 mg/dL   Calcium 9.1  8.4 - 10.5 mg/dL   GFR calc non Af Amer >90  >90 mL/min   GFR calc Af Amer >90  >90 mL/min  PROTIME-INR     Status: None   Collection Time    07/25/13  2:15 AM      Result Value Ref Range   Prothrombin Time 13.2  11.6 - 15.2 seconds   INR 1.02   0.00 - 1.49    Signed: Cresenciano Genre, MD 07/25/2013, 2:55 PM   Time Spent on Discharge: >30 minutes Services Ordered on Discharge: none Equipment Ordered on Discharge: none

## 2013-07-25 NOTE — Interval H&P Note (Signed)
History and Physical Interval Note:  07/25/2013 11:57 AM The chart is been reviewed. The recent myocardial perfusion study demonstrated a large lateral wall perfusion abnormality . Symptoms would be compatible with unstable angina. Markers and negative for myocardial injury. Kara Lamb  has presented today for surgery, with the diagnosis of Chest pain  The various methods of treatment have been discussed with the patient and family. After consideration of risks, benefits and other options for treatment, the patient has consented to  Procedure(s): LEFT HEART CATHETERIZATION WITH CORONARY ANGIOGRAM (N/A) as a surgical intervention .  The patient's history has been reviewed, patient examined, no change in status, stable for surgery.  I have reviewed the patient's chart and labs.  Questions were answered to the patient's satisfaction.    The procedure was discussed in detail including the possibility of percutaneous coronary intervention which may become dictated by myocardial infarction, death, emergency surgery, stroke, bleeding, among others. The patient accepts the procedure and is willing to move forward.Cath Lab Visit (complete for each Cath Lab visit)  Clinical Evaluation Leading to the Procedure:   ACS: no  Non-ACS:    Anginal Classification: CCS III  Anti-ischemic medical therapy: Minimal Therapy (1 class of medications)  Non-Invasive Test Results: Intermediate-risk stress test findings: cardiac mortality 1-3%/year  Prior CABG: No previous CABG       Sinclair Grooms

## 2013-07-25 NOTE — CV Procedure (Signed)
     Left Heart Catheterization with Coronary Angiography  Report  Kara Lamb  58 y.o.  female 1956-04-06  Procedure Date: 07/25/2013  Referring Physician: Ezzie Dural, M.D.  Primary Cardiologist:: Alba Cory, M.D.  INDICATIONS: Lateral ischemia on nuclear study. History of prior stents. Having pain at rest. No evidence of infarction or ischemia on EKG to  PROCEDURE: 1. Left heart catheterization; 2. Coronary angiography; 3. Left ventriculography  CONSENT:  The risks, benefits, and details of the procedure were explained in detail to the patient. Risks including death, stroke, heart attack, kidney injury, allergy, limb ischemia, bleeding and radiation injury were discussed.  The patient verbalized understanding and wanted to proceed.  Informed written consent was obtained.  PROCEDURE TECHNIQUE:  After Xylocaine anesthesia a 5 French Slender sheath was placed in the right radial artery with a single double wall needle stick.  Coronary angiography was done using a 5 [place] JR 4 and JL 3.5 catheter.  Left ventriculography was done using the JR 4 catheter and hand injection.   Hemostasis was achieved with a wrist band without complications.  MEDICATIONS: 3 mg IV Versed, 100 mcg of fentanyl    CONTRAST:  Total of 90 cc.  COMPLICATIONS:  None   HEMODYNAMICS:  Aortic pressure 138/73 mmHg; LV pressure 45/0 mmHg; LVEDP 14 mm mercury  ANGIOGRAPHIC DATA:   The left main coronary artery is widely patent.  The left anterior descending artery is transapical and free of significant obstruction. The mid vessel contains eccentric 50-60% narrowing. The proximal vessel contains eccentric 30-40% narrowing..  The left circumflex artery is widely patent. The previously placed mid vessel stent is without evidence of significant obstruction. The stent in his proximal to a distal bifurcation into the second and third obtuse marginal. Both branches have ostial narrowing similar to 2008 following stent  implantation. The more distal branch contains ostial 60-75% narrowing.  The right coronary artery is dominant. The posterior descending and zone the inferoapical segment. The mid vessel contains overlapping stents. The more distally placed stent contains diffuse 40-50% ISR commencing below the first acute marginal branch. This region of ISR is not felt to be clinically/hemodynamically significant. The distal right coronary contains luminal irregularities.Marland Kitchen    LEFT VENTRICULOGRAM:  Left ventricular angiogram was done in the 30 RAO projection and revealed symmetrical contractility with EF 55%.   IMPRESSIONS:  1. Widely patent coronary arteries without significant change in appearance when compared to the most recent images from 2011. There is mild to moderate ISR in the mid RCA, 50-70% stenosis in a branch of the circumflex beyond the mid stent, and 50% mid LAD narrowing. 2. Overall normal LV function 3. Unable to identify a coronary obstructive abnormality that would account for mid to apical lateral wall ischemia. The possibility of dynamic obstruction (coronary vasoconstriction) during the myocardial perfusion study would be a consideration.   RECOMMENDATION:  Medical therapy with continued aggressive risk factor modification images and treatment plan reviewed with Dr. Burt Knack.Marland Kitchen

## 2013-07-25 NOTE — Progress Notes (Signed)
TR BAND REMOVAL  LOCATION:    right radial  DEFLATED PER PROTOCOL:    yes  TIME BAND OFF / DRESSING APPLIED:    1530   SITE UPON ARRIVAL:    Level 0  SITE AFTER BAND REMOVAL:    Level 0  REVERSE ALLEN'S TEST:     positive  CIRCULATION SENSATION AND MOVEMENT:    Within Normal Limits   yes  COMMENTS:   Gauze dressing secured with tegaderm at 1530. Rechecked site at 1600 as ordered and dressing dry and intact, CSMs wnls, right radial and ulnar pulses +2, assessment without change.

## 2013-07-25 NOTE — Progress Notes (Signed)
Patient seen, examined. Available data reviewed. Agree with findings, assessment, and plan as outlined by Dr McLean. Exam reveals anxious but alert, oriented woman in NAD. Lungs CTA, heart RRR without murmur, no peripheral edema. Updated labs reviewed. Myoview showed lateral ischemia and depressed LVEF. Plan cardiac cath +/- PCI for evaluation and treatment of this patient who has unstable angina with an abnormal Myoview scan (moderate risk). If cath shows stable anatomy, I think she can be discharged on current medical therapy.  Kara Lamb, M.D. 07/25/2013 11:48 AM   

## 2013-07-25 NOTE — Progress Notes (Signed)
Patient complaining of chest pressure, " not like my heart pain, that pain is sharp." Requested morphine. Reported to Karlyn Agee MD. Orders for nitroglycerin sl and GI cocktail if sl nitroglycerin ineffective. Patient refused nitroglycerin stating she uses it at home and it never helps and refused GI cocktail stating she just had an endoscopy and her GI is fine. Reviewed plan of care for discharge later today and that a cardiology provider would come to review information and answer questions prior to discharge as she had requested. Explained we were trying to see what medical treatment may help her when she is discharged home. Patient verbalized understanding and thanked me.

## 2013-07-25 NOTE — Progress Notes (Signed)
[facility] Cardiology Progress Note   Subjective: Pt states CP is 7/10.  She has abrasion from BP cuff left arm  RN: no events. UDS + amphetamines  Objective: Vital signs in last 24 hours: Filed Vitals:   07/25/13 0000 07/25/13 0357 07/25/13 0419 07/25/13 0731  BP: 129/59 115/62  127/65  Pulse:      Temp:  97.8 F (36.6 C)  98 F (36.7 C)  TempSrc:  Oral  Oral  Resp: 18 16  14   Height:      Weight:   210 lb 15.7 oz (95.7 kg)   SpO2: 99% 97%     Weight change: 0 lb (0 kg)  Intake/Output Summary (Last 24 hours) at 07/25/13 1027 Last data filed at 07/25/13 0700  Gross per 24 hour  Intake    940 ml  Output      0 ml  Net    940 ml   Vitals reviewed.HR 60, 127/65 (87) General: resting in bed asleep and arousable, NAD HEENT:C-Road/at, no scleral icterus Cardiac: RRR, no rubs, murmurs or gallops Pulm: clear to auscultation bilaterally, no wheezes, rales, or rhonchi.  Abd: soft, nontender, nondistended, BS present, obese  Ext: warm and well perfused, no pedal edema Neuro: alert and oriented X3, cranial nerves II-XII grossly intact, moving all 4 extremities  Lab Results: Basic Metabolic Panel:  Recent Labs Lab 07/23/13 2110 07/24/13 0730 07/25/13 0215  NA 144 147 140  K 3.4* 3.7 3.9  CL 104 108 101  CO2 24 29 27   GLUCOSE 147* 116* 148*  BUN 14 11 12   CREATININE 0.66 0.61 0.71  CALCIUM 9.1 8.9 9.1  MG 1.8  --  1.8   Liver Function Tests:  Recent Labs Lab 07/24/13 0730  AST 27  ALT 21  ALKPHOS 78  BILITOT 0.2*  PROT 6.4  ALBUMIN 3.3*   CBC:  Recent Labs Lab 07/23/13 2110 07/24/13 0730  WBC 9.2 6.8  NEUTROABS 5.2  --   HGB 13.1 13.2  HCT 39.0 39.3  MCV 105.1* 105.9*  PLT 260 238   Cardiac Enzymes:  Recent Labs Lab 07/23/13 2110 07/24/13 07/24/13 0730  TROPONINI <0.30 <0.30 <0.30   BNP:  Recent Labs Lab 07/23/13 2110  PROBNP 12.6   Thyroid Function Tests:  Recent Labs Lab 07/23/13 2110  TSH 4.421    Coagulation:  Recent Labs Lab 07/23/13 2110 07/24/13 1018 07/25/13 0215  LABPROT 12.8 11.9 13.2  INR 0.98 0.89 1.02   Urine Drug Screen: Drugs of Abuse     Component Value Date/Time   LABOPIA POSITIVE* 07/24/2013 1819   COCAINSCRNUR NONE DETECTED 07/24/2013 1819   LABBENZ NONE DETECTED 07/24/2013 1819   AMPHETMU POSITIVE* 07/24/2013 1819   THCU NONE DETECTED 07/24/2013 1819   LABBARB NONE DETECTED 07/24/2013 1819    Misc. Labs: none Micro Results: Recent Results (from the past 240 hour(s))  MRSA PCR SCREENING     Status: None   Collection Time    07/23/13  5:48 PM      Result Value Ref Range Status   MRSA by PCR NEGATIVE  NEGATIVE Final   Comment:            The GeneXpert MRSA Assay (FDA     approved for NASAL specimens     only), is one component of a     comprehensive MRSA colonization     surveillance program. It is not     intended to diagnose MRSA  infection nor to guide or     monitor treatment for     MRSA infections.   Studies/Results: Nm Myocar Multi W/spect W/wall Motion / Ef  07/24/2013   CLINICAL DATA:  Chest pain  EXAM: Lexiscan Myoview  TECHNIQUE: The patient received IV Lexiscan .4mg  over 15 seconds. 33.0 mCi of Technetium 37m Sestamibi injected at 30 seconds. Quantitative SPECT images were obtained in the vertical, horizontal and short axis planes after a 45 minute delay. Rest images were obtained with similar planes and delay using 10.2 mCi of Technetium 22m Sestamibi.  FINDINGS: ECG: SR normal  Symptoms:  Dyspnea  RAW Data:  Motion  QPS: 3  Quantitative Gated SPECT EF:  44% apical and lateral hypokinesis  Perfusion Images:  Lateral wall ischemia at mid and apical level  IMPRESSION: Abnormal myovue with lateral wall ischemia at mid and apical level EF down at 44% see above  Discussed with Dr Dorena Dew   Electronically Signed   By: Jenkins Rouge M.D.   On: 07/24/2013 14:08   Medications:  Medications Prior to Admission  Medication Sig  Dispense Refill  . albuterol (PROVENTIL HFA;VENTOLIN HFA) 108 (90 BASE) MCG/ACT inhaler Inhale 2 puffs into the lungs every 4 (four) hours as needed for wheezing or shortness of breath.  1 Inhaler  5  . albuterol (PROVENTIL) (2.5 MG/3ML) 0.083% nebulizer solution Take 3 mLs (2.5 mg total) by nebulization every 6 (six) hours as needed for wheezing or shortness of breath.  140 mL  6  . amLODipine (NORVASC) 2.5 MG tablet Take 1 tablet (2.5 mg total) by mouth daily. For hypertension  30 tablet  11  . aspirin EC 81 MG tablet Take 1 tablet (81 mg total) by mouth every morning. For heart health      . atorvastatin (LIPITOR) 40 MG tablet Take 1 tablet (40 mg total) by mouth daily with supper. For high cholesterol control  30 tablet  3  . clopidogrel (PLAVIX) 75 MG tablet Take 1 tablet (75 mg total) by mouth daily with supper. For heart condition  30 tablet  3  . dicyclomine (BENTYL) 10 MG capsule Take 10 mg by mouth every 6 (six) hours as needed (abdominal pain).      . DULoxetine (CYMBALTA) 60 MG capsule Take 1 capsule (60 mg total) by mouth daily. For depression  30 capsule  0  . esomeprazole (NEXIUM) 40 MG capsule Take 40 mg by mouth 2 (two) times daily before a meal. For acid reflux      . levothyroxine (SYNTHROID, LEVOTHROID) 50 MCG tablet Take 1 tablet (50 mcg total) by mouth daily before breakfast. For low thyroid hormone      . losartan (COZAAR) 50 MG tablet Take 1 tablet (50 mg total) by mouth daily. For hypertension  30 tablet  3  . Multiple Vitamins-Minerals (MULTIVITAMIN WITH MINERALS) tablet Take 1 tablet by mouth every morning. For low vitamin      . nitroGLYCERIN (NITROSTAT) 0.4 MG SL tablet Place 1 tablet (0.4 mg total) under the tongue every 5 (five) minutes as needed for chest pain.  25 tablet  3  . traZODone (DESYREL) 300 MG tablet Take 1 tablet (300 mg total) by mouth at bedtime. For sleep  30 tablet  0   Scheduled Meds: . amLODipine  2.5 mg Oral Daily  . ARIPiprazole  5 mg Oral Q2000   . aspirin EC  81 mg Oral Daily  . atorvastatin  40 mg Oral Q supper  .  clopidogrel  75 mg Oral Q supper  . diazepam  5 mg Oral On Call  . docusate sodium  100 mg Oral BID  . DULoxetine  60 mg Oral Daily  . enoxaparin (LOVENOX) injection  40 mg Subcutaneous Q24H  . levothyroxine  50 mcg Oral QAC breakfast  . losartan  50 mg Oral Daily  . pantoprazole  80 mg Oral BID  . sodium chloride  3 mL Intravenous Q12H  . sodium chloride  3 mL Intravenous Q12H  . traZODone  300 mg Oral QHS    Continuous Infusions: . sodium chloride 100 mL/hr at 07/25/13 0419   PRN Meds:.sodium chloride, sodium chloride, acetaminophen, albuterol, albuterol, dicyclomine, morphine injection, nitroGLYCERIN, ondansetron (ZOFRAN) IV, sodium chloride, sodium chloride Assessment/Plan: 58 y.o PMH CAD s/p BMS (RCA and LCX 2008; ISR of RCA tx'ed with Xience DES 12/09, cath 04/2009 and 05/2011 and 02/2013 nonobstructive caths), chronic chest pain, anxiety/depression, h/o suicide attempt, HTN, obesity, h/o tobacco and alcohol abuse, GERD, Barretts esophagus, hiatal hernia.  She presented 2/9 with chest pain radiating to left neck/shoulder pain.  Sensations felt like prior heart attack and were minimally relieved with NTG. She also had associated sweating.    #Atypical Chest pain with moderate risk for cardiac etiology -pt has a history of chronic chest pain and unstable angina. Tim[place] score of at least 3=moderate risk  -LHC (02/22/13): mid OM1 stent patent with 20-30% proximal to the OM stent, mid RCA stent patent with 50-60% ISR, EF 60-65%. Continued medical therapy was recommended.Left heart cath 05/2011 with patent stent in RCA with 60%-65% ISR (FFR 0.98 - not flow limiting) and non-obstructive disease elsewhere -trop negative x 3 -2/10 Nuclear stress test abnormal myovue with lateral wall ischemia at mid and apical level EF down at 44% see above . Normal nuclear stress test 02/2011 with EF 70% -morphine 2mg  q3 hours, prn NTG, prn  Zofran  -In the past pt had no benefit from Ranexa.  Cp seems to be better on Norvasc  -UDS + amphetamines and opiates  -continue medical therapy Aspirin 81 mg, Lipitor 40 mg qd, Plavix 75 mg  -will get cath at 12 PM today d/t abnormal nuc test 2/10  #CAD s/p stents  -Aspirin 81 mg, Lipitor 40 mg qd, Plavix 75 mg   #Dyslipidemia     Component Value Date/Time   CHOL 145 02/22/2013 0450   TRIG 116 02/22/2013 0450   HDL 51 02/22/2013 0450   CHOLHDL 2.8 02/22/2013 0450   VLDL 23 02/22/2013 0450   LDLCALC 71 02/22/2013 0450   -on statin   #HYPERTENSION -Norvasc 2.5 mg, Cozaar 50 mg qd   #Psychiatric disorder  -Abilify 5 mg qd, Cymbalta 60 mg qd, Trazadone 300 mg qd   #Tobacco abuse  -smoking cessation  -pt quit smoking 2.5 years ago   #Asthma, mild intermittent -stable, Albuterol neb  #GERD/Barretts esophagus/hiatal hernia -she follows with GI in [place] and had recent upper endoscopy -Protonix 80 mg bid   Hypothyroidism  -Synthroid 50 mcg  #F/E/N -NSL -electrolytes wnl -NPO  DVT #px  -Lovenox    Cardiologist: Dr. Percival Spanish PCP: Dr. Lovette Cliche [place] Corella       LOS: 2 days   Cresenciano Genre, MD [phone] 07/25/2013, 10:27 AM

## 2013-07-26 ENCOUNTER — Ambulatory Visit: Payer: Self-pay | Admitting: Physician Assistant

## 2013-08-14 ENCOUNTER — Ambulatory Visit (INDEPENDENT_AMBULATORY_CARE_PROVIDER_SITE_OTHER): Payer: BC Managed Care – PPO | Admitting: Cardiology

## 2013-08-14 ENCOUNTER — Encounter: Payer: Self-pay | Admitting: Cardiology

## 2013-08-14 VITALS — BP 120/90 | Wt 214.0 lb

## 2013-08-14 DIAGNOSIS — I2510 Atherosclerotic heart disease of native coronary artery without angina pectoris: Secondary | ICD-10-CM

## 2013-08-14 DIAGNOSIS — I10 Essential (primary) hypertension: Secondary | ICD-10-CM

## 2013-08-14 MED ORDER — LOSARTAN POTASSIUM 50 MG PO TABS: 50.0000 mg | ORAL_TABLET | Freq: Every day | ORAL | Status: DC

## 2013-08-14 MED ORDER — FUROSEMIDE 20 MG PO TABS: 20.0000 mg | ORAL_TABLET | ORAL | Status: DC | PRN

## 2013-08-14 MED ORDER — ATORVASTATIN CALCIUM 40 MG PO TABS: 40.0000 mg | ORAL_TABLET | Freq: Every day | ORAL | Status: DC

## 2013-08-14 MED ORDER — AMLODIPINE BESYLATE 2.5 MG PO TABS: 2.5000 mg | ORAL_TABLET | Freq: Every day | ORAL | Status: DC

## 2013-08-14 MED ORDER — CLOPIDOGREL BISULFATE 75 MG PO TABS: 75.0000 mg | ORAL_TABLET | Freq: Every day | ORAL | Status: DC

## 2013-08-14 NOTE — Progress Notes (Signed)
HPI The patient presents for follow up of CAD.  She has had previous stenting of her right coronary and circumflex. She was most recently in the hospital a few weeks ago. She had chest pain. There was some mild lateral wall ischemia on stress perfusion imaging. However cardiac catheterization demonstrated that the LAD had nonobstructive 50-60% stenosis. The circumflex had a patent stent. There was some ostial 60-70% stenosis in an obtuse marginal arising from the stented area. This was unchanged from previous. She had overlapping stents in her right coronary artery with some mild 40-50% in-stent restenosis. However, this was managed medically. She returns for followup. She continues to get some sporadic chest pain but this has not changed from previous. She's not describing any new shortness of breath, PND or orthopnea. She was told recently that she did not have COPD although she does have asthma. She has GI symptoms with reflux. She has been diagnosed with a hiatal hernia. She's most distressed because she's been unable to lose weight in large part because she is unable to exercise. She is limited by various musculoskeletal complaints. She wants to explore weight loss surgery is a possibility.   Allergies  Allergen Reactions  . Prednisone     psychosis  . Imdur [Isosorbide]     Low blood pressure  . Lactose Intolerance (Gi)   . Nitroglycerin     Patches--headaches  . Zoloft [Sertraline Hcl]     Severe diarrhea    Current Outpatient Prescriptions  Medication Sig Dispense Refill  . albuterol (PROVENTIL HFA;VENTOLIN HFA) 108 (90 BASE) MCG/ACT inhaler Inhale 2 puffs into the lungs every 4 (four) hours as needed for wheezing or shortness of breath.  1 Inhaler  5  . albuterol (PROVENTIL) (2.5 MG/3ML) 0.083% nebulizer solution Take 3 mLs (2.5 mg total) by nebulization every 6 (six) hours as needed for wheezing or shortness of breath.  140 mL  6  . amLODipine (NORVASC) 2.5 MG tablet Take 1  tablet (2.5 mg total) by mouth daily. For hypertension  30 tablet  11  . aspirin EC 81 MG tablet Take 1 tablet (81 mg total) by mouth every morning. For heart health      . atorvastatin (LIPITOR) 40 MG tablet Take 1 tablet (40 mg total) by mouth daily with supper. For high cholesterol control  30 tablet  11  . clopidogrel (PLAVIX) 75 MG tablet Take 1 tablet (75 mg total) by mouth daily with supper. For heart condition  30 tablet  11  . dicyclomine (BENTYL) 10 MG capsule Take 10 mg by mouth every 6 (six) hours as needed (abdominal pain).      . DULoxetine (CYMBALTA) 60 MG capsule Take 1 capsule (60 mg total) by mouth daily. For depression  30 capsule  0  . esomeprazole (NEXIUM) 40 MG capsule Take 40 mg by mouth 2 (two) times daily before a meal. For acid reflux      . levothyroxine (SYNTHROID) 50 MCG tablet Take 50 mcg by mouth daily before breakfast.      . losartan (COZAAR) 50 MG tablet Take 1 tablet (50 mg total) by mouth daily. For hypertension  30 tablet  11  . metoprolol tartrate (LOPRESSOR) 25 MG tablet Take 1 tablet (25 mg total) by mouth 2 (two) times daily.  60 tablet  1  . Multiple Vitamins-Minerals (MULTIVITAMIN WITH MINERALS) tablet Take 1 tablet by mouth every morning. For low vitamin      . nitroGLYCERIN (NITROSTAT) 0.4 MG SL  tablet Place 1 tablet (0.4 mg total) under the tongue every 5 (five) minutes as needed for chest pain.  25 tablet  3  . traZODone (DESYREL) 300 MG tablet Take 1 tablet (300 mg total) by mouth at bedtime. For sleep  30 tablet  0  . furosemide (LASIX) 20 MG tablet Take 1 tablet (20 mg total) by mouth as needed.  30 tablet  6   No current facility-administered medications for this visit.    Past Medical History  Diagnosis Date  . CAD (coronary artery disease)     a. s/p BMS to RCA and LCX 2008; b. ISR of RCA rx'd with Xience DES 12/09; c. Requires extensive sedation for cath; d. cath 11/10: nonobs; e.Cath 2/12: nonobs; f. 05/2011 Cath: nonobs; g. 02/2013 nonobs,  07/2013 nonobstructive, EF 60-65%.  . Chronic chest pain   . Anxiety and depression   . H/O: suicide attempt     drug overdose 3/08  . Hypertension   . Obesity   . Tobacco abuse     a. quit in 2012  . H/O ETOH abuse   . Arthritis   . Hyperlipidemia   . Colitis   . GERD (gastroesophageal reflux disease)   . Complication of anesthesia   . Asthma     Past Surgical History  Procedure Laterality Date  . Ptca      stent  . Colon biopsy  08/12/2008  . Tubal ligation  1982  . Cesarean section      x 3  . Carpal tunnel release      ROS:  As stated in the HPI and negative for all other systems.   PHYSICAL EXAM BP 120/90  Wt 214 lb (97.07 kg) GENERAL:  Well appearing HEENT:  Pupils equal round and reactive, fundi not visualized, oral mucosa unremarkable NECK:  No jugular venous distention, waveform within normal limits, carotid upstroke brisk and symmetric, no bruits, no thyromegaly LYMPHATICS:  No cervical, inguinal adenopathy LUNGS:  Clear to auscultation bilaterally BACK:  No CVA tenderness CHEST:  Unremarkable HEART:  PMI not displaced or sustained,S1 and S2 within normal limits, no S3, no S4, no clicks, no rubs, NO murmurs ABD:  Flat, positive bowel sounds normal in frequency in pitch, no bruits, no rebound, no guarding, no midline pulsatile mass, no hepatomegaly, no splenomegaly EXT:  2 plus pulses throughout, no edema, no cyanosis no clubbing SKIN:  No rashes no nodules NEURO:  Cranial nerves II through XII grossly intact, motor grossly intact throughout PSYCH:  Cognitively intact, oriented to person place and time   ASSESSMENT AND PLAN  CAD:  The patient has no new symptoms. She has small vessel and nonobstructive disease as described. There was some ischemia or perfusion study and some of her symptoms could be related to her small vessel disease and ischemia but we are going to continue to manage this medically. We had a long discussion about this.  HTN:  The blood  pressure is at target. No change in medications is indicated. We will continue with therapeutic lifestyle changes (TLC).  HYPERLIPIDEMIA:  This has been controlled and she will continue with current therapy.  OVERWEIGHT:  I support her plan to explore weight loss therapy.

## 2013-08-14 NOTE — Patient Instructions (Signed)
The current medical regimen is effective;  continue present plan and medications.  Follow up in 6 months with Dr Hochrein.  You will receive a letter in the mail 2 months before you are due.  Please call us when you receive this letter to schedule your follow up appointment.  

## 2013-08-21 ENCOUNTER — Encounter: Payer: Self-pay | Admitting: *Deleted

## 2013-08-21 ENCOUNTER — Telehealth: Payer: Self-pay | Admitting: Cardiology

## 2013-08-21 NOTE — Telephone Encounter (Signed)
Spoke with pt and gave her ICD -9 codes from her problem list.  She states she has a form that has to be filled out and also needs a letter from Dr Percival Spanish stating she needs bariatric surgery.  She is mailing the form today.  I advised her that at times it takes [place] a while to receive things in the mail and that I can't promise that I can have it completed by when she needs it based on when it is received in the mail.

## 2013-08-21 NOTE — Telephone Encounter (Signed)
New Message  Pt called states she wants to research her office visits and cath procedures.. She is requesting all diagnotic codes for insurance purposes.. Please assist

## 2013-08-27 ENCOUNTER — Telehealth: Payer: Self-pay | Admitting: Cardiology

## 2013-08-27 NOTE — Telephone Encounter (Signed)
Pt states she needs 1) letter of Cardiac clearance addressed to [place]: Dr Patrcia Dolly [address], [place], [place] [address] and  2) letter if medical necessity stating that you approve that she needs gastric by pass surgery for her long term health benefit due to her obesity.  She states that it needs to include that you have seen her since 2008 (she was Dr Bensimhon's pt then Dr Angelena Form)  Also needs to include 5 dates that you have seen her.  These 5 days need to list her DX and wts - I was able to find 08/14/2013 for CAD (wt 45) and scott saw her 9/29 for evaluation of CP (wt 193)  Everything else I see is hospitalizations. And she wants these letter by Thursday morning.  Advised that I am not sure that we can get these completed within her time frame

## 2013-08-27 NOTE — Telephone Encounter (Signed)
F/u ° ° °Pt returning your call. Please call pt. °

## 2013-08-27 NOTE — Telephone Encounter (Signed)
Left message for pt - need to know the name of both MD's she needs a letter to go to.  Also when I spoke with her last week she was going to mail a form in that she needed filled out.  I have not received it and am unable as to whether it was mailed or not.

## 2013-08-27 NOTE — Telephone Encounter (Signed)
New message     Pt is having gastric bypass in [place].  Please address letter to  [facility]   [address] [place] [address].  Pt need same 2 letters done just with a different doctor's name.  Call when ready--however, would like to pick up Thursday am.

## 2013-08-30 ENCOUNTER — Ambulatory Visit (INDEPENDENT_AMBULATORY_CARE_PROVIDER_SITE_OTHER): Payer: Self-pay | Admitting: Surgery

## 2013-08-30 ENCOUNTER — Encounter: Payer: Self-pay | Admitting: *Deleted

## 2013-08-30 NOTE — Telephone Encounter (Signed)
Letters completed - will be mailed to pts home address per her request.

## 2013-09-05 ENCOUNTER — Ambulatory Visit: Payer: Self-pay | Admitting: Cardiology

## 2013-11-08 ENCOUNTER — Other Ambulatory Visit: Payer: Self-pay | Admitting: *Deleted

## 2013-11-08 MED ORDER — METOPROLOL TARTRATE 25 MG PO TABS: 25.0000 mg | ORAL_TABLET | Freq: Two times a day (BID) | ORAL | Status: DC

## 2013-12-21 ENCOUNTER — Telehealth: Payer: Self-pay | Admitting: Cardiology

## 2013-12-21 NOTE — Telephone Encounter (Signed)
New Message:  Pt states she is in a psych hospital... She is requesting a call back at this number [phone].. States she is retaining fluid in her legs.. States the scales went up by about 20 lbs. States she gained that much in about 4 to 5 days. PT is taking lasix.Marland Kitchen Pt is requesting to speak to Dr. Rosezella [place] nurse. Also wants to be worked in for an appt next week. States she will be discharged on Monday.Robbie Louis" personal pin number to gain access to pt

## 2013-12-26 NOTE — Telephone Encounter (Signed)
I received this message today 7/15 after being on vacation since Thursday of last week.  I have attempted several times to contact the pt at the number listed (just rings - NA) cell # has been d/ced.  Left message on the home phone #'s voicemail to call back.

## 2013-12-27 NOTE — Telephone Encounter (Signed)
[facility] at [phone] - pt has been d/ced.

## 2013-12-28 NOTE — Telephone Encounter (Signed)
Was in the hospital for 16 days for depression  - was sitting a lot in group sessions.  Since being out of edema has resolved and she is not having any problems now.  She will call back if edema returns.

## 2013-12-28 NOTE — Telephone Encounter (Signed)
Left another message for pt at home number to contact the office to discuss her medical issue

## 2014-01-15 ENCOUNTER — Encounter: Payer: Self-pay | Admitting: Internal Medicine

## 2014-01-15 ENCOUNTER — Ambulatory Visit: Payer: Self-pay | Admitting: Critical Care Medicine

## 2014-01-22 ENCOUNTER — Encounter: Payer: Self-pay | Admitting: Critical Care Medicine

## 2014-01-22 ENCOUNTER — Ambulatory Visit (INDEPENDENT_AMBULATORY_CARE_PROVIDER_SITE_OTHER): Payer: Medicare Other | Admitting: Critical Care Medicine

## 2014-01-22 VITALS — BP 130/76 | HR 80 | Temp 98.0°F | Ht 60.75 in | Wt 215.0 lb

## 2014-01-22 DIAGNOSIS — J4520 Mild intermittent asthma, uncomplicated: Secondary | ICD-10-CM

## 2014-01-22 DIAGNOSIS — F17200 Nicotine dependence, unspecified, uncomplicated: Secondary | ICD-10-CM

## 2014-01-22 DIAGNOSIS — R0902 Hypoxemia: Secondary | ICD-10-CM

## 2014-01-22 DIAGNOSIS — J45909 Unspecified asthma, uncomplicated: Secondary | ICD-10-CM

## 2014-01-22 DIAGNOSIS — J42 Unspecified chronic bronchitis: Secondary | ICD-10-CM

## 2014-01-22 MED ORDER — NICOTINE POLACRILEX 4 MG MT LOZG: 4.0000 mg | LOZENGE | OROMUCOSAL | Status: DC | PRN

## 2014-01-22 NOTE — Assessment & Plan Note (Signed)
Smoking cessation counseling offered  Pt to use nicotine lozenges

## 2014-01-22 NOTE — Patient Instructions (Signed)
Overnight sleep oxygen test will be obtained Albuterol as needed Stop smoking, use nicotine lozenges 4mg  6-8 times per day Return 4 months

## 2014-01-22 NOTE — Assessment & Plan Note (Signed)
Mild intermittent asthma, PFTs and CT chest do NOT show COPD/emphysema Ongoing tobacco use Need to reassess nocturnal oxygen needs Plan Overnight sleep oxygen test will be obtained Albuterol as needed Stop smoking, use nicotine lozenges 4mg  6-8 times per day Return 4 months

## 2014-01-22 NOTE — Progress Notes (Signed)
Subjective:    Patient ID: Kara Lamb, female    DOB: [birth year], 58 y.o.   MRN: [id number]  HPI  01/22/2014 Chief Complaint  Patient presents with  . Follow-up    Has SOB and needs to use albuterol inhaler when smoking.  This improves with periods of no smoking.  Occas chest pain.  Cough -- prod in the mornings with small amount of dark green mucus.  Using nicotine patches - has red itching, burning rash around patch.    Pt has had more more stress, and went back to smoking. Pt has had more suicide attempts.  Pt with addiction issues.  Patch itches and burns.    Pt smokes about 1 PPD. Uses saba  PUL ASTHMA HISTORY 01/22/2014  Symptoms 0-2 days/week  Nighttime awakenings 0-2/month  Interference with activity Minor limitations  SABA use 0-2 days/wk  Exacerbations requiring oral steroids 0-1 / year     Review of Systems  Constitutional:   No  weight loss, night sweats,  Fevers, chills, + fatigue, or  lassitude.  HEENT:   No headaches,  Difficulty swallowing,  Tooth/dental problems, or  Sore throat,                No sneezing, itching, ear ache, nasal congestion, +post nasal drip,   CV:  No chest pain,  Orthopnea, PND, swelling in lower extremities, anasarca, dizziness, palpitations, syncope.   GI  No heartburn, indigestion, abdominal pain, nausea, vomiting, diarrhea, change in bowel habits, loss of appetite, bloody stools.   Resp:    No coughing up of blood.    No chest wall deformity  Skin: no rash or lesions.  GU: no dysuria, change in color of urine, no urgency or frequency.  No flank pain, no hematuria   MS:  No joint pain or swelling.  No decreased range of motion.  No back pain.  Psych:  No change in mood or affect. No depression or anxiety.  No memory loss.     Objective:   Physical Exam BP 130/76  Pulse 80  Temp(Src) 98 F (36.7 C) (Oral)  Ht 5' 0.75" (1.543 m)  Wt 215 lb (97.523 kg)  BMI 40.96 kg/m2  SpO2 96%  GEN: A/Ox3; pleasant , NAD, well nourished    HEENT:  [place]/AT,  EACs-clear, TMs-wnl, NOSE-clear, THROAT-clear, no lesions, no postnasal drip or exudate noted.   NECK:  Supple w/ fair ROM; no JVD; normal carotid impulses w/o bruits; no thyromegaly or nodules palpated; no lymphadenopathy.  RESP  Coarse BS  w/o, wheezes/ rales/ or rhonchi.no accessory muscle use, no dullness to percussion  CARD:  RRR, no m/r/g  , no peripheral edema, pulses intact, no cyanosis or clubbing.  GI:   Soft & nt; nml bowel sounds; no organomegaly or masses detected.  Musco: Warm bil, no deformities or joint swelling noted.   Neuro: alert, no focal deficits noted.    Skin: Warm, no lesions or rashes   Pulmonary function studies on 07/05/2013 are completely normal with no abnormalities in lung volumes spirometry or diffusion PFTs 06/2013: FeV1 101% FeV1/FVC 84% TLC 100% DLCO 97% RaW 142%     Assessment & Plan:   Asthma, mild intermittent Mild intermittent asthma, PFTs and CT chest do NOT show COPD/emphysema Ongoing tobacco use Need to reassess nocturnal oxygen needs Plan Overnight sleep oxygen test will be obtained Albuterol as needed Stop smoking, use nicotine lozenges 4mg  6-8 times per day Return 4 months   Tobacco use disorder Smoking  cessation counseling offered  Pt to use nicotine lozenges    Updated Medication List Outpatient Encounter Prescriptions as of 01/22/2014  Medication Sig  . acetaminophen (TYLENOL) 650 MG CR tablet Take 650 mg by mouth every 8 (eight) hours as needed for pain.  Marland Kitchen albuterol (PROVENTIL HFA;VENTOLIN HFA) 108 (90 BASE) MCG/ACT inhaler Inhale 2 puffs into the lungs every 4 (four) hours as needed for wheezing or shortness of breath.  Marland Kitchen albuterol (PROVENTIL) (2.5 MG/3ML) 0.083% nebulizer solution Take 3 mLs (2.5 mg total) by nebulization every 6 (six) hours as needed for wheezing or shortness of breath.  Marland Kitchen amLODipine (NORVASC) 2.5 MG tablet Take 1 tablet (2.5 mg total) by mouth daily. For hypertension  . aspirin EC  81 MG tablet Take 1 tablet (81 mg total) by mouth every morning. For heart health  . atorvastatin (LIPITOR) 40 MG tablet Take 1 tablet (40 mg total) by mouth daily with supper. For high cholesterol control  . clonazePAM (KLONOPIN) 1 MG tablet Take 1 mg by mouth 2 (two) times daily.  . clopidogrel (PLAVIX) 75 MG tablet Take 1 tablet (75 mg total) by mouth daily with supper. For heart condition  . dicyclomine (BENTYL) 10 MG capsule Take 10 mg by mouth every 6 (six) hours as needed (abdominal pain).  Marland Kitchen esomeprazole (NEXIUM) 40 MG capsule Take 40 mg by mouth daily. For acid reflux  . furosemide (LASIX) 20 MG tablet Take 20 mg by mouth daily.  Marland Kitchen lamoTRIgine (LAMICTAL) 25 MG tablet Take 25 mg by mouth daily.  Marland Kitchen levothyroxine (SYNTHROID) 50 MCG tablet Take 50 mcg by mouth daily before breakfast.  . losartan (COZAAR) 50 MG tablet Take 1 tablet (50 mg total) by mouth daily. For hypertension  . metoprolol tartrate (LOPRESSOR) 25 MG tablet Take 25 mg by mouth daily.  . Multiple Vitamins-Minerals (MULTIVITAMIN WITH MINERALS) tablet Take 1 tablet by mouth every morning. For low vitamin  . nicotine (NICODERM CQ - DOSED IN MG/24 HOURS) 21 mg/24hr patch Place 21 mg onto the skin daily.  . nitroGLYCERIN (NITROSTAT) 0.4 MG SL tablet Place 1 tablet (0.4 mg total) under the tongue every 5 (five) minutes as needed for chest pain.  Marland Kitchen ondansetron (ZOFRAN-ODT) 4 MG disintegrating tablet Take 4-8 mg by mouth every 6 (six) hours as needed for nausea or vomiting.  . traMADol (ULTRAM) 50 MG tablet Take 100 mg by mouth every 6 (six) hours as needed.  . traZODone (DESYREL) 100 MG tablet Take 200 mg by mouth at bedtime.  . [DISCONTINUED] furosemide (LASIX) 20 MG tablet Take 1 tablet (20 mg total) by mouth as needed.  . [DISCONTINUED] metoprolol tartrate (LOPRESSOR) 25 MG tablet Take 1 tablet (25 mg total) by mouth 2 (two) times daily.  . nicotine polacrilex ([employer] NICOTINE POLACRILEX) 4 MG lozenge Take 1 lozenge (4 mg total) by  mouth as needed for smoking cessation.  . [DISCONTINUED] DULoxetine (CYMBALTA) 60 MG capsule Take 1 capsule (60 mg total) by mouth daily. For depression  . [DISCONTINUED] traZODone (DESYREL) 300 MG tablet Take 1 tablet (300 mg total) by mouth at bedtime. For sleep

## 2014-01-30 ENCOUNTER — Telehealth: Payer: Self-pay | Admitting: Critical Care Medicine

## 2014-01-30 NOTE — Telephone Encounter (Signed)
Spoke with Crystal- she is aware of ONO needing to be set up with [facility] for patient(from [place]). She stated to have Rhonda help get this order taken care of today for this time only. I have left a message for patient stating that we are working on this order today and our apologizes in the delay. If she has any other questions or concerns to please contact our office.

## 2014-02-08 ENCOUNTER — Telehealth: Payer: Self-pay | Admitting: Critical Care Medicine

## 2014-02-08 DIAGNOSIS — J4520 Mild intermittent asthma, uncomplicated: Secondary | ICD-10-CM

## 2014-02-08 NOTE — Telephone Encounter (Signed)
Called, spoke with pt - Informed her of below results and recs per Dr. Joya Gaskins.  She verbalized understanding.  Pt states she believes [facility] is needing this information to recert her for o2.  Called [facility], spoke with Iris, She will check on this and will call back.

## 2014-02-08 NOTE — Telephone Encounter (Signed)
Called pt's home #, [phone], lmomtcb for pt Called pt's cell #, [phone], # no longer in service

## 2014-02-08 NOTE — Telephone Encounter (Signed)
Pt returned call. Please leave detailed message if no answer. [phone]

## 2014-02-08 NOTE — Telephone Encounter (Signed)
RA ONO positive for desaturations So pt requalifies for oxygen 2L qhs

## 2014-02-08 NOTE — Telephone Encounter (Signed)
Spoke with Maudie Mercury with [facility].  Per Maudie Mercury, pt's chart not showing o2 needs recert.    lmomtcb for pt to inform her of above.

## 2014-02-08 NOTE — Telephone Encounter (Signed)
Kim w/ [facility] is calling back & can be reached at [phone].  Satira Anis

## 2014-02-11 ENCOUNTER — Ambulatory Visit (INDEPENDENT_AMBULATORY_CARE_PROVIDER_SITE_OTHER): Payer: BC Managed Care – PPO | Admitting: Cardiology

## 2014-02-11 ENCOUNTER — Encounter: Payer: Self-pay | Admitting: Cardiology

## 2014-02-11 VITALS — BP 120/70 | HR 58 | Ht 61.0 in | Wt 214.0 lb

## 2014-02-11 DIAGNOSIS — I2583 Coronary atherosclerosis due to lipid rich plaque: Principal | ICD-10-CM

## 2014-02-11 DIAGNOSIS — I2510 Atherosclerotic heart disease of native coronary artery without angina pectoris: Secondary | ICD-10-CM

## 2014-02-11 DIAGNOSIS — E782 Mixed hyperlipidemia: Secondary | ICD-10-CM

## 2014-02-11 NOTE — Patient Instructions (Signed)
Your physician recommends that you schedule a follow-up appointment in: one year with Dr. Hochrein  

## 2014-02-11 NOTE — Progress Notes (Signed)
HPI The patient presents for follow up of non obstructive CAD and mild edema.  She has had previous stenting of her right coronary and circumflex.  She has been under a great deal of stress as her grandson is dying with leukemia.  She is finalizing a divorce.  She is going through counseling. She's not having any new chest pressure, neck or arm discomfort. She was having some lower lower extremityswelling but she was having her feet on the ground quite a bit. She has been taking some Lasix.  She's not describing any new shortness of breath, PND or orthopnea however.  Allergies  Allergen Reactions  . Prednisone     psychosis  . Imdur [Isosorbide]     Low blood pressure  . Lactose Intolerance (Gi)   . Nitroglycerin     Patches--headaches  . Zoloft [Sertraline Hcl]     Severe diarrhea    Current Outpatient Prescriptions  Medication Sig Dispense Refill  . acetaminophen (TYLENOL) 650 MG CR tablet Take 650 mg by mouth every 8 (eight) hours as needed for pain.      Marland Kitchen albuterol (PROVENTIL HFA;VENTOLIN HFA) 108 (90 BASE) MCG/ACT inhaler Inhale 2 puffs into the lungs every 4 (four) hours as needed for wheezing or shortness of breath.  1 Inhaler  5  . albuterol (PROVENTIL) (2.5 MG/3ML) 0.083% nebulizer solution Take 3 mLs (2.5 mg total) by nebulization every 6 (six) hours as needed for wheezing or shortness of breath.  140 mL  6  . amLODipine (NORVASC) 2.5 MG tablet Take 1 tablet (2.5 mg total) by mouth daily. For hypertension  30 tablet  11  . aspirin EC 81 MG tablet Take 1 tablet (81 mg total) by mouth every morning. For heart health      . atorvastatin (LIPITOR) 40 MG tablet Take 1 tablet (40 mg total) by mouth daily with supper. For high cholesterol control  30 tablet  11  . clonazePAM (KLONOPIN) 1 MG tablet Take 1 mg by mouth 2 (two) times daily.      . clopidogrel (PLAVIX) 75 MG tablet Take 1 tablet (75 mg total) by mouth daily with supper. For heart condition  30 tablet  11  . dicyclomine  (BENTYL) 10 MG capsule Take 10 mg by mouth every 6 (six) hours as needed (abdominal pain).      . DULoxetine (CYMBALTA) 30 MG capsule Take 30 mg by mouth daily.      Marland Kitchen esomeprazole (NEXIUM) 40 MG capsule Take 40 mg by mouth daily. For acid reflux      . furosemide (LASIX) 20 MG tablet Take 20 mg by mouth daily.      Marland Kitchen levothyroxine (SYNTHROID) 50 MCG tablet Take 50 mcg by mouth daily before breakfast.      . losartan (COZAAR) 50 MG tablet Take 1 tablet (50 mg total) by mouth daily. For hypertension  30 tablet  11  . metoprolol tartrate (LOPRESSOR) 25 MG tablet Take 25 mg by mouth 2 (two) times daily.       . Multiple Vitamins-Minerals (MULTIVITAMIN WITH MINERALS) tablet Take 1 tablet by mouth every morning. For low vitamin      . nicotine polacrilex ([employer]S NICOTINE POLACRILEX) 4 MG lozenge Take 1 lozenge (4 mg total) by mouth as needed for smoking cessation.  100 tablet  4  . nitroGLYCERIN (NITROSTAT) 0.4 MG SL tablet Place 1 tablet (0.4 mg total) under the tongue every 5 (five) minutes as needed for chest pain.  25  tablet  3  . ondansetron (ZOFRAN-ODT) 4 MG disintegrating tablet Take 4-8 mg by mouth every 6 (six) hours as needed for nausea or vomiting.      . traMADol (ULTRAM) 50 MG tablet Take 100 mg by mouth every 6 (six) hours as needed.      . trazodone (DESYREL) 300 MG tablet Take 300 mg by mouth at bedtime.       No current facility-administered medications for this visit.    Past Medical History  Diagnosis Date  . CAD (coronary artery disease)     a. s/p BMS to RCA and LCX 2008; b. ISR of RCA rx'd with Xience DES 12/09; c. Requires extensive sedation for cath; d. cath 11/10: nonobs; e.Cath 2/12: nonobs; f. 05/2011 Cath: nonobs; g. 02/2013 nonobs, 07/2013 nonobstructive, EF 60-65%.  . Chronic chest pain   . Anxiety and depression   . H/O: suicide attempt     drug overdose 3/08  . Hypertension   . Obesity   . Tobacco abuse     a. quit in 2012  . H/O ETOH abuse   . Arthritis   .  Hyperlipidemia   . Colitis   . GERD (gastroesophageal reflux disease)   . Complication of anesthesia   . Asthma     Past Surgical History  Procedure Laterality Date  . Ptca      stent  . Colon biopsy  08/12/2008  . Tubal ligation  1982  . Cesarean section      x 3  . Carpal tunnel release      ROS:  As stated in the HPI and negative for all other systems.   PHYSICAL EXAM BP 120/70  Pulse 58  Ht 5\' 1"  (1.549 m)  Wt 214 lb (97.07 kg)  BMI 40.46 kg/m2 GENERAL:  Well appearing HEENT:  Pupils equal round and reactive, fundi not visualized, oral mucosa unremarkable NECK:  No jugular venous distention, waveform within normal limits, carotid upstroke brisk and symmetric, no bruits, no thyromegaly LYMPHATICS:  No cervical, inguinal adenopathy LUNGS:  Clear to auscultation bilaterally BACK:  No CVA tenderness CHEST:  Unremarkable HEART:  PMI not displaced or sustained,S1 and S2 within normal limits, no S3, no S4, no clicks, no rubs, NO murmurs ABD:  Flat, positive bowel sounds normal in frequency in pitch, no bruits, no rebound, no guarding, no midline pulsatile mass, no hepatomegaly, no splenomegaly EXT:  2 plus pulses throughout, no edema, no cyanosis no clubbing SKIN:  No rashes no nodules NEURO:  Cranial nerves II through XII grossly intact, motor grossly intact throughout PSYCH:  Cognitively intact, oriented to person place and time  EKG:  Sinus rhythm, rate 58, axis within normal limits, intervals within normal limits, no acute ST-T wave changes. 02/11/2014   ASSESSMENT AND PLANis a 0006 month follow this today. He  CAD:  The patient has no new symptoms. She has small vessel and nonobstructive disease as described. She will continue with risk reduction  HTN:  The blood pressure is at target. No change in medications is indicated. We will continue with therapeutic lifestyle changes (TLC).  HYPERLIPIDEMIA:  This has Not been checked since last September and I will order  this.  TOBACCO ABUSE:  She is being treated with nicotine replacement

## 2014-02-11 NOTE — Telephone Encounter (Signed)
Spoke with Maudie Mercury with [facility].  Informed her of below per pt.  Maudie Mercury reports she will call billing office and discuss with them.  Per Maudie Mercury, no further information or documentation needed from our office at this time.  Reports they have ONO results.  Maudie Mercury will call pt back with an update and will call our office back if anything further is needed.  Pt aware.

## 2014-02-11 NOTE — Telephone Encounter (Signed)
Called, spoke with pt -  Informed her of below. Reports o2 is being denied through her insurnace and her most recent EOB is showing reason for denial is because o2 needs to be re certified.   Advised I would call [facility] back to discuss further and would have them call her.  She verbalized understanding and voiced no further questions or concerns at this time.  Called [facility], NA.  WCB

## 2014-02-11 NOTE — Telephone Encounter (Signed)
Called [facility], spoke with Caryl Pina.  Kim currently in a mtg.  She will relay msg to Kim to call office back to discuss below.

## 2014-02-12 ENCOUNTER — Encounter: Payer: Self-pay | Admitting: Cardiology

## 2014-03-02 ENCOUNTER — Other Ambulatory Visit: Payer: Self-pay | Admitting: Critical Care Medicine

## 2014-03-05 ENCOUNTER — Telehealth: Payer: Self-pay | Admitting: Critical Care Medicine

## 2014-03-05 NOTE — Telephone Encounter (Signed)
ATC pt line busy wcb 

## 2014-03-06 NOTE — Telephone Encounter (Signed)
i am ok with flu vaccine

## 2014-03-06 NOTE — Telephone Encounter (Signed)
Per Crystal send to her

## 2014-03-06 NOTE — Telephone Encounter (Signed)
Pt is requesting flu vaccine and that our office give the vaccine. Pt states that she does not want to drive all the way to [facility] to have this done. Pt insurance will end at the end of this month and patient is wanting to have the vaccine done before then. Please advise Dr Joya Gaskins and Donella Stade. Thanks.   Dr Joya Gaskins, Please advise if you are okay with the patient having a flu vaccine.

## 2014-03-11 NOTE — Telephone Encounter (Signed)
Pt to come to [facility] office tomorrow, Sept 29 at 8:45 am for flu vaccine.   Pt aware and confirmed appt. Nothing further needed.

## 2014-03-12 ENCOUNTER — Ambulatory Visit (INDEPENDENT_AMBULATORY_CARE_PROVIDER_SITE_OTHER): Payer: BC Managed Care – PPO

## 2014-03-12 DIAGNOSIS — Z23 Encounter for immunization: Secondary | ICD-10-CM

## 2014-03-12 NOTE — Progress Notes (Signed)
Flu vaccination given to pt in the [place] office.

## 2014-04-12 ENCOUNTER — Other Ambulatory Visit: Payer: Self-pay | Admitting: Critical Care Medicine

## 2014-04-15 ENCOUNTER — Encounter: Payer: Self-pay | Admitting: Cardiology

## 2014-05-10 ENCOUNTER — Inpatient Hospital Stay (HOSPITAL_COMMUNITY)
Admission: EM | Admit: 2014-05-10 | Discharge: 2014-05-12 | DRG: 303 | Disposition: A | Discharge status: Home / Self Care | Payer: Medicare Other | Source: Other Acute Inpatient Hospital | Attending: Cardiology | Admitting: Cardiology

## 2014-05-10 DIAGNOSIS — J449 Chronic obstructive pulmonary disease, unspecified: Secondary | ICD-10-CM | POA: Diagnosis not present

## 2014-05-10 DIAGNOSIS — I209 Angina pectoris, unspecified: Secondary | ICD-10-CM | POA: Diagnosis not present

## 2014-05-10 DIAGNOSIS — Z955 Presence of coronary angioplasty implant and graft: Secondary | ICD-10-CM

## 2014-05-10 DIAGNOSIS — Z6841 Body Mass Index (BMI) 40.0 and over, adult: Secondary | ICD-10-CM

## 2014-05-10 DIAGNOSIS — M1990 Unspecified osteoarthritis, unspecified site: Secondary | ICD-10-CM | POA: Diagnosis present

## 2014-05-10 DIAGNOSIS — I10 Essential (primary) hypertension: Secondary | ICD-10-CM | POA: Diagnosis present

## 2014-05-10 DIAGNOSIS — Z8249 Family history of ischemic heart disease and other diseases of the circulatory system: Secondary | ICD-10-CM

## 2014-05-10 DIAGNOSIS — E669 Obesity, unspecified: Secondary | ICD-10-CM | POA: Diagnosis present

## 2014-05-10 DIAGNOSIS — R072 Precordial pain: Secondary | ICD-10-CM | POA: Diagnosis not present

## 2014-05-10 DIAGNOSIS — E785 Hyperlipidemia, unspecified: Secondary | ICD-10-CM | POA: Diagnosis present

## 2014-05-10 DIAGNOSIS — K219 Gastro-esophageal reflux disease without esophagitis: Secondary | ICD-10-CM | POA: Diagnosis present

## 2014-05-10 DIAGNOSIS — I2510 Atherosclerotic heart disease of native coronary artery without angina pectoris: Secondary | ICD-10-CM | POA: Diagnosis present

## 2014-05-10 DIAGNOSIS — F05 Delirium due to known physiological condition: Secondary | ICD-10-CM

## 2014-05-10 DIAGNOSIS — I252 Old myocardial infarction: Secondary | ICD-10-CM

## 2014-05-10 DIAGNOSIS — R946 Abnormal results of thyroid function studies: Secondary | ICD-10-CM | POA: Diagnosis present

## 2014-05-10 DIAGNOSIS — F319 Bipolar disorder, unspecified: Secondary | ICD-10-CM | POA: Diagnosis present

## 2014-05-10 DIAGNOSIS — Z87891 Personal history of nicotine dependence: Secondary | ICD-10-CM

## 2014-05-10 DIAGNOSIS — R079 Chest pain, unspecified: Secondary | ICD-10-CM | POA: Diagnosis present

## 2014-05-10 DIAGNOSIS — F419 Anxiety disorder, unspecified: Secondary | ICD-10-CM | POA: Diagnosis present

## 2014-05-10 DIAGNOSIS — Z79899 Other long term (current) drug therapy: Secondary | ICD-10-CM | POA: Diagnosis not present

## 2014-05-10 DIAGNOSIS — Z91011 Allergy to milk products: Secondary | ICD-10-CM | POA: Diagnosis not present

## 2014-05-10 DIAGNOSIS — R410 Disorientation, unspecified: Secondary | ICD-10-CM | POA: Diagnosis present

## 2014-05-10 DIAGNOSIS — J45909 Unspecified asthma, uncomplicated: Secondary | ICD-10-CM | POA: Diagnosis present

## 2014-05-10 DIAGNOSIS — Z888 Allergy status to other drugs, medicaments and biological substances status: Secondary | ICD-10-CM

## 2014-05-10 DIAGNOSIS — Z7982 Long term (current) use of aspirin: Secondary | ICD-10-CM

## 2014-05-10 DIAGNOSIS — I25119 Atherosclerotic heart disease of native coronary artery with unspecified angina pectoris: Principal | ICD-10-CM | POA: Diagnosis present

## 2014-05-10 DIAGNOSIS — Z7902 Long term (current) use of antithrombotics/antiplatelets: Secondary | ICD-10-CM | POA: Diagnosis not present

## 2014-05-11 ENCOUNTER — Encounter (HOSPITAL_COMMUNITY): Payer: Self-pay | Admitting: *Deleted

## 2014-05-11 ENCOUNTER — Inpatient Hospital Stay (HOSPITAL_COMMUNITY): Payer: Medicare Other

## 2014-05-11 DIAGNOSIS — R079 Chest pain, unspecified: Secondary | ICD-10-CM | POA: Diagnosis present

## 2014-05-11 DIAGNOSIS — I10 Essential (primary) hypertension: Secondary | ICD-10-CM

## 2014-05-11 DIAGNOSIS — F05 Delirium due to known physiological condition: Secondary | ICD-10-CM

## 2014-05-11 DIAGNOSIS — I209 Angina pectoris, unspecified: Secondary | ICD-10-CM

## 2014-05-11 LAB — COMPREHENSIVE METABOLIC PANEL
ALT: 23 U/L (ref 0–35)
AST: 29 U/L (ref 0–37)
Albumin: 3.7 g/dL (ref 3.5–5.2)
Alkaline Phosphatase: 103 U/L (ref 39–117)
Anion gap: 14 (ref 5–15)
BUN: 10 mg/dL (ref 6–23)
CO2: 25 mEq/L (ref 19–32)
CREATININE: 0.69 mg/dL (ref 0.50–1.10)
Calcium: 9.6 mg/dL (ref 8.4–10.5)
Chloride: 98 mEq/L (ref 96–112)
GFR calc Af Amer: >90 mL/min (ref >90)
GLUCOSE: 103 mg/dL — AB (ref 70–99)
Potassium: 3.9 mEq/L (ref 3.7–5.3)
SODIUM: 137 meq/L (ref 137–147)
Total Bilirubin: 0.5 mg/dL (ref 0.3–1.2)
Total Protein: 6.9 g/dL (ref 6.0–8.3)

## 2014-05-11 LAB — CBC WITH DIFFERENTIAL/PLATELET
Basophils Absolute: 0.1 10*3/uL (ref 0.0–0.1)
Basophils Relative: 0 % (ref 0–1)
Eosinophils Absolute: 0.1 10*3/uL (ref 0.0–0.7)
Eosinophils Relative: 1 % (ref 0–5)
HCT: 39.5 % (ref 36.0–46.0)
HEMOGLOBIN: 13.1 g/dL (ref 12.0–15.0)
LYMPHS ABS: 3.6 10*3/uL (ref 0.7–4.0)
LYMPHS PCT: 31 % (ref 12–46)
MCH: 33.4 pg (ref 26.0–34.0)
MCHC: 33.2 g/dL (ref 30.0–36.0)
MCV: 100.8 fL — ABNORMAL HIGH (ref 78.0–100.0)
Monocytes Absolute: 0.8 10*3/uL (ref 0.1–1.0)
Monocytes Relative: 7 % (ref 3–12)
NEUTROS ABS: 7.2 10*3/uL (ref 1.7–7.7)
NEUTROS PCT: 61 % (ref 43–77)
Platelets: 218 10*3/uL (ref 150–400)
RBC: 3.92 MIL/uL (ref 3.87–5.11)
RDW: 12.3 % (ref 11.5–15.5)
WBC: 11.9 10*3/uL — ABNORMAL HIGH (ref 4.0–10.5)

## 2014-05-11 LAB — MRSA PCR SCREENING: MRSA BY PCR: NEGATIVE

## 2014-05-11 LAB — PRO B NATRIURETIC PEPTIDE: PRO B NATRI PEPTIDE: 227 pg/mL — AB (ref 0–125)

## 2014-05-11 LAB — MAGNESIUM: Magnesium: 1.6 mg/dL (ref 1.5–2.5)

## 2014-05-11 LAB — TSH: TSH: 12.72 u[IU]/mL — ABNORMAL HIGH (ref 0.350–4.500)

## 2014-05-11 LAB — TROPONIN I
Troponin I: <0.3 ng/mL (ref <0.30)
Troponin I: <0.3 ng/mL (ref <0.30)

## 2014-05-11 LAB — T4, FREE: Free T4: 0.91 ng/dL (ref 0.80–1.80)

## 2014-05-11 MED ORDER — ALBUTEROL SULFATE (2.5 MG/3ML) 0.083% IN NEBU: 2.5000 mg | INHALATION_SOLUTION | Freq: Four times a day (QID) | RESPIRATORY_TRACT | Status: DC | PRN

## 2014-05-11 MED ORDER — ZOLPIDEM TARTRATE 5 MG PO TABS: 5.0000 mg | ORAL_TABLET | Freq: Every evening | ORAL | Status: DC | PRN

## 2014-05-11 MED ORDER — ONDANSETRON HCL 4 MG/2ML IJ SOLN: 4.0000 mg | Freq: Four times a day (QID) | INTRAMUSCULAR | Status: DC | PRN

## 2014-05-11 MED ORDER — ACETAMINOPHEN 325 MG PO TABS: 650.0000 mg | ORAL_TABLET | ORAL | Status: DC | PRN

## 2014-05-11 MED ORDER — DOCUSATE SODIUM 100 MG PO CAPS
100.0000 mg | ORAL_CAPSULE | Freq: Two times a day (BID) | ORAL | Status: DC
  Administered 2014-05-11 – 2014-05-12 (×3): 100 mg via ORAL
  Filled 2014-05-11 (×4): qty 1

## 2014-05-11 MED ORDER — SODIUM CHLORIDE 0.9 % IJ SOLN: 3.0000 mL | INTRAMUSCULAR | Status: DC | PRN

## 2014-05-11 MED ORDER — PANTOPRAZOLE SODIUM 40 MG PO TBEC
40.0000 mg | DELAYED_RELEASE_TABLET | Freq: Every day | ORAL | Status: DC
  Administered 2014-05-11 – 2014-05-12 (×2): 40 mg via ORAL
  Filled 2014-05-11 (×2): qty 1

## 2014-05-11 MED ORDER — ASPIRIN EC 81 MG PO TBEC
81.0000 mg | DELAYED_RELEASE_TABLET | Freq: Every day | ORAL | Status: DC
  Administered 2014-05-11 – 2014-05-12 (×2): 81 mg via ORAL
  Filled 2014-05-11 (×2): qty 1

## 2014-05-11 MED ORDER — ATORVASTATIN CALCIUM 40 MG PO TABS
40.0000 mg | ORAL_TABLET | Freq: Every day | ORAL | Status: DC
Start: 2014-05-11 — End: 2014-05-12
  Administered 2014-05-11: 40 mg via ORAL
  Filled 2014-05-11 (×2): qty 1

## 2014-05-11 MED ORDER — DULOXETINE HCL 30 MG PO CPEP
30.0000 mg | ORAL_CAPSULE | Freq: Every day | ORAL | Status: DC
  Administered 2014-05-11 – 2014-05-12 (×2): 30 mg via ORAL
  Filled 2014-05-11 (×2): qty 1

## 2014-05-11 MED ORDER — ALBUTEROL SULFATE HFA 108 (90 BASE) MCG/ACT IN AERS: 2.0000 | INHALATION_SPRAY | RESPIRATORY_TRACT | Status: DC | PRN

## 2014-05-11 MED ORDER — ASPIRIN EC 81 MG PO TBEC: 81.0000 mg | DELAYED_RELEASE_TABLET | Freq: Every day | ORAL | Status: DC

## 2014-05-11 MED ORDER — METOPROLOL TARTRATE 25 MG PO TABS
25.0000 mg | ORAL_TABLET | Freq: Two times a day (BID) | ORAL | Status: DC
  Administered 2014-05-11 – 2014-05-12 (×4): 25 mg via ORAL
  Filled 2014-05-11 (×5): qty 1

## 2014-05-11 MED ORDER — SODIUM CHLORIDE 0.9 % IJ SOLN
3.0000 mL | Freq: Two times a day (BID) | INTRAMUSCULAR | Status: DC
  Administered 2014-05-11 – 2014-05-12 (×3): 3 mL via INTRAVENOUS

## 2014-05-11 MED ORDER — ONDANSETRON HCL 4 MG PO TABS: 4.0000 mg | ORAL_TABLET | Freq: Four times a day (QID) | ORAL | Status: DC | PRN

## 2014-05-11 MED ORDER — TRAMADOL HCL 50 MG PO TABS
100.0000 mg | ORAL_TABLET | Freq: Four times a day (QID) | ORAL | Status: DC | PRN
  Administered 2014-05-11 – 2014-05-12 (×2): 100 mg via ORAL
  Filled 2014-05-11 (×2): qty 2

## 2014-05-11 MED ORDER — AMLODIPINE BESYLATE 2.5 MG PO TABS
2.5000 mg | ORAL_TABLET | Freq: Every day | ORAL | Status: DC
  Administered 2014-05-11 – 2014-05-12 (×2): 2.5 mg via ORAL
  Filled 2014-05-11 (×2): qty 1

## 2014-05-11 MED ORDER — ACETAMINOPHEN ER 650 MG PO TBCR: 650.0000 mg | EXTENDED_RELEASE_TABLET | Freq: Three times a day (TID) | ORAL | Status: DC | PRN

## 2014-05-11 MED ORDER — ASPIRIN 81 MG PO CHEW: 324.0000 mg | CHEWABLE_TABLET | ORAL | Status: AC

## 2014-05-11 MED ORDER — ASPIRIN 300 MG RE SUPP
300.0000 mg | RECTAL | Status: AC
  Filled 2014-05-11: qty 1

## 2014-05-11 MED ORDER — CLOPIDOGREL BISULFATE 75 MG PO TABS
75.0000 mg | ORAL_TABLET | Freq: Every day | ORAL | Status: DC
  Administered 2014-05-11: 75 mg via ORAL
  Filled 2014-05-11 (×2): qty 1

## 2014-05-11 MED ORDER — HEPARIN SODIUM (PORCINE) 5000 UNIT/ML IJ SOLN
5000.0000 [IU] | Freq: Three times a day (TID) | INTRAMUSCULAR | Status: DC
  Filled 2014-05-11 (×7): qty 1

## 2014-05-11 MED ORDER — HEPARIN SODIUM (PORCINE) 5000 UNIT/ML IJ SOLN
5000.0000 [IU] | Freq: Three times a day (TID) | INTRAMUSCULAR | Status: DC
Start: 2014-05-11 — End: 2014-05-11

## 2014-05-11 MED ORDER — NITROGLYCERIN 2 % TD OINT
0.5000 [in_us] | TOPICAL_OINTMENT | Freq: Four times a day (QID) | TRANSDERMAL | Status: DC
  Filled 2014-05-11: qty 30

## 2014-05-11 MED ORDER — LOSARTAN POTASSIUM 50 MG PO TABS
50.0000 mg | ORAL_TABLET | Freq: Every day | ORAL | Status: DC
  Administered 2014-05-11 – 2014-05-12 (×2): 50 mg via ORAL
  Filled 2014-05-11 (×2): qty 1

## 2014-05-11 MED ORDER — NITROGLYCERIN 0.4 MG SL SUBL: 0.4000 mg | SUBLINGUAL_TABLET | SUBLINGUAL | Status: DC | PRN

## 2014-05-11 MED ORDER — DICYCLOMINE HCL 10 MG PO CAPS
10.0000 mg | ORAL_CAPSULE | Freq: Four times a day (QID) | ORAL | Status: DC | PRN
  Filled 2014-05-11: qty 1

## 2014-05-11 MED ORDER — TRAZODONE HCL 100 MG PO TABS
300.0000 mg | ORAL_TABLET | Freq: Every day | ORAL | Status: DC
  Administered 2014-05-11 (×2): 300 mg via ORAL
  Filled 2014-05-11 (×2): qty 2
  Filled 2014-05-11: qty 3

## 2014-05-11 MED ORDER — LEVOTHYROXINE SODIUM 50 MCG PO TABS
50.0000 ug | ORAL_TABLET | Freq: Every day | ORAL | Status: DC
  Administered 2014-05-11 – 2014-05-12 (×2): 50 ug via ORAL
  Filled 2014-05-11 (×3): qty 1

## 2014-05-11 MED ORDER — SODIUM CHLORIDE 0.9 % IV SOLN: 250.0000 mL | INTRAVENOUS | Status: DC | PRN

## 2014-05-11 MED ORDER — MORPHINE SULFATE 2 MG/ML IJ SOLN
1.0000 mg | INTRAMUSCULAR | Status: DC | PRN
  Administered 2014-05-11: 1 mg via INTRAVENOUS
  Filled 2014-05-11 (×2): qty 1

## 2014-05-11 MED ORDER — CLONAZEPAM 1 MG PO TABS
1.0000 mg | ORAL_TABLET | Freq: Two times a day (BID) | ORAL | Status: DC
  Administered 2014-05-11 – 2014-05-12 (×4): 1 mg via ORAL
  Filled 2014-05-11 (×4): qty 1

## 2014-05-11 NOTE — Plan of Care (Signed)
Problem: Phase I Progression Outcomes Goal: MD aware of Cardiac Marker results Outcome: Completed/Met Date Met:  05/11/14 Goal: Voiding-avoid urinary catheter unless indicated Outcome: Completed/Met Date Met:  05/11/14

## 2014-05-11 NOTE — Plan of Care (Signed)
Problem: Phase I Progression Outcomes Goal: Anginal pain relieved Outcome: Completed/Met Date Met:  05/11/14  Problem: Phase II Progression Outcomes Goal: Hemodynamically stable Outcome: Completed/Met Date Met:  05/11/14 Goal: Anginal pain relieved Outcome: Completed/Met Date Met:  05/11/14

## 2014-05-11 NOTE — Progress Notes (Signed)
Pt is refusing SQ Heparin. Pt states that the Heparin causes large "lumps" under her skin. Purpose of Heparin was explained to the pt in great detail, but pt is still refusing.

## 2014-05-11 NOTE — Progress Notes (Signed)
Patient Name: Kara Lamb      SUBJECTIVE:58 y.o female with MI s/p PCI , chronic angina, abnormal NM stress with lateral wall inducible ischemia 07/2013, latest cath with small vessel and non-obstructive ASCAD, hypertension, mood disorder, COPD who presents with chief complaint of chest pain.  Tn neg despite hours of pain Describes an acute confusional state,  occasinally in tears Now feeling better    CAth    a. s/p BMS to RCA and LCX 2008; b. ISR of RCA rx'd with Xience DES 12/09; c. Requires extensive sedation for cath; d. cath 11/10: nonobs; e.Cath 2/12: nonobs; f. 05/2011 Cath: nonobs; g. 02/2013 nonobs, 07/2013 nonobstructive, EF 60-65%.  Cath was undertaken following abnormal Myoview   It showed no change    Past Medical History  Diagnosis Date  . CAD (coronary artery disease)     a. s/p BMS to RCA and LCX 2008; b. ISR of RCA rx'd with Xience DES 12/09; c. Requires extensive sedation for cath; d. cath 11/10: nonobs; e.Cath 2/12: nonobs; f. 05/2011 Cath: nonobs; g. 02/2013 nonobs, 07/2013 nonobstructive, EF 60-65%.  . Chronic chest pain   . Anxiety and depression   . H/O: suicide attempt     drug overdose 3/08  . Hypertension   . Obesity   . Tobacco abuse     a. quit in 2012  . H/O ETOH abuse   . Arthritis   . Hyperlipidemia   . Colitis   . GERD (gastroesophageal reflux disease)   . Complication of anesthesia   . Asthma     Scheduled Meds:  Scheduled Meds: . amLODipine  2.5 mg Oral Daily  . aspirin  324 mg Oral NOW   Or  . aspirin  300 mg Rectal NOW  . aspirin EC  81 mg Oral Daily  . atorvastatin  40 mg Oral Q supper  . clonazePAM  1 mg Oral BID  . clopidogrel  75 mg Oral Q supper  . docusate sodium  100 mg Oral BID  . DULoxetine  30 mg Oral Daily  . heparin  5,000 Units Subcutaneous 3 times per day  . levothyroxine  50 mcg Oral QAC breakfast  . losartan  50 mg Oral Daily  . metoprolol tartrate  25 mg Oral BID  . nitroGLYCERIN  0.5 inch Topical  4 times per day  . pantoprazole  40 mg Oral Daily  . sodium chloride  3 mL Intravenous Q12H  . sodium chloride  3 mL Intravenous Q12H  . trazodone  300 mg Oral QHS   Continuous Infusions:  sodium chloride, acetaminophen, albuterol, dicyclomine, morphine injection, nitroGLYCERIN, ondansetron **OR** ondansetron (ZOFRAN) IV, sodium chloride, traMADol, zolpidem    PHYSICAL EXAM Filed Vitals:   05/11/14 0340 05/11/14 0500 05/11/14 0800 05/11/14 1143  BP: 122/87  105/66 102/50  Pulse: 65   57  Temp: 97.3 F (36.3 C)  97.5 F (36.4 C)   TempSrc: Oral  Oral   Resp: 15  15 20   Height:      Weight:  214 lb 4.6 oz (97.2 kg)    SpO2: 100%  99% 100%   Well developed and nourished in no acute distress HENT normal Neck supple with JVP-flat Clear Regular rate and rhythm, no murmurs or gallops Abd-soft with active BS No Clubbing cyanosis edema Skin-warm and dry A & Oriented  Grossly normal sensory and motor function Emotionally labile   TELEMETRY: Reviewed telemetry pt in NSR:    Intake/Output  Summary (Last 24 hours) at 05/11/14 1356 Last data filed at 05/11/14 0500  Gross per 24 hour  Intake    290 ml  Output    700 ml  Net   -410 ml    LABS: Basic Metabolic Panel:  Recent Labs Lab 05/11/14 0100  NA 137  K 3.9  CL 98  CO2 25  GLUCOSE 103*  BUN 10  CREATININE 0.69  CALCIUM 9.6  MG 1.6   Cardiac Enzymes:  Recent Labs  05/11/14 0100 05/11/14 0633 05/11/14 1200  TROPONINI <0.30 <0.30 <0.30   CBC:  Recent Labs Lab 05/11/14 0100  WBC 11.9*  NEUTROABS 7.2  HGB 13.1  HCT 39.5  MCV 100.8*  PLT 218   PROTIME: No results for input(s): LABPROT, INR in the last 72 hours. Liver Function Tests:  Recent Labs  05/11/14 0100  AST 29  ALT 23  ALKPHOS 103  BILITOT 0.5  PROT 6.9  ALBUMIN 3.7   No results for input(s): LIPASE, AMYLASE in the last 72 hours. BNP: BNP (last 3 results)  Recent Labs  07/23/13 2110 05/11/14 0100  PROBNP 12.6 227.0*    D-Dimer: No results for input(s): DDIMER in the last 72 hours. Hemoglobin A1C: No results for input(s): HGBA1C in the last 72 hours. Fasting Lipid Panel: No results for input(s): CHOL, HDL, LDLCALC, TRIG, CHOLHDL, LDLDIRECT in the last 72 hours. Thyroid Function Tests:  Recent Labs  05/11/14 0100  TSH 12.720*   Anemia Panel:    ASSESSMENT AND PLAN:  Active Problems:   Bipolar 1 disorder   CAD (coronary artery disease)   Chest pain her chest pain syndrome is without ovbjective evidence of ischemia  With hx of false postitive myoview i would not pursue myoview scanning at this juncture and would avoid cath without objective data  Transfer to floor and anticipate discharge in am  Will alert Dr [facility] that she is here The nurse has been very concerned re confusion and " mental disconnect"  The pt notes that she has had problems with morphine before and that her confsuion is now back to baseline  Will check T3 and T4    Signed, Virl Axe MD  05/11/2014

## 2014-05-11 NOTE — Plan of Care (Signed)
Problem: Consults Goal: Chest Pain Patient Education (See Patient Education module for education specifics.) Outcome: Completed/Met Date Met:  05/11/14 Goal: Skin Care Protocol Initiated - if Braden Score 18 or less If consults are not indicated, leave blank or document N/A Outcome: Not Applicable Date Met:  02/66/91  Problem: Phase I Progression Outcomes Goal: Hemodynamically stable Outcome: Completed/Met Date Met:  05/11/14 Goal: Anginal pain relieved Outcome: Progressing Goal: Aspirin unless contraindicated Outcome: Completed/Met Date Met:  05/11/14 Medicated at [facility] @ 2047 05/10/14

## 2014-05-11 NOTE — H&P (Signed)
Admit date: 05/10/2014 Primary Physician  [facility] Angelique Blonder., MD Primary Cardiologist  Dr. Percival Spanish  CC: chest pain  ID: 58 y.o female with MI s/p PCI , chronic angina, abnormal NM stress with lateral wall inducible ischemia 07/2013, latest cath with small vessel and non-obstructive ASCAD, hypertension, mood disorder, COPD who presents with chief complaint of chest pain.   HPI: Pt was watching TV at about 4pm and had sudden onset of chest pain. Left sided chest pain, rated 9/10, radiating to her jaw. Associated with cold sweats and shortness of breath. She took SL NTG X 2 without relief in her symptoms. She waited three house before calling EMS. Pt did have nausea, and surprisingly she had urinary and stool incontinence. Pt also had diarrhea. Pt has one episode two weeks ago where her pain was similar in nature but resolved after an hour and after taking 2 SL NTG. She exerts less than 4 METs.    [facility] OSH data - VSS, Trop 0.11, CBC wnl, INE 1, GFR> 60, K 3.6, Glc 125, AST 41. Pt started on ntg gtt and heparin gtt at outside hospital. CXR without acute findings,    PMH:   Past Medical History  Diagnosis Date  . CAD (coronary artery disease)     a. s/p BMS to RCA and LCX 2008; b. ISR of RCA rx'd with Xience DES 12/09; c. Requires extensive sedation for cath; d. cath 11/10: nonobs; e.Cath 2/12: nonobs; f. 05/2011 Cath: nonobs; g. 02/2013 nonobs, 07/2013 nonobstructive, EF 60-65%.  . Chronic chest pain   . Anxiety and depression   . H/O: suicide attempt     drug overdose 3/08  . Hypertension   . Obesity   . Tobacco abuse     a. quit in 2012  . H/O ETOH abuse   . Arthritis   . Hyperlipidemia   . Colitis   . GERD (gastroesophageal reflux disease)   . Complication of anesthesia   . Asthma     PSH:   Past Surgical History  Procedure Laterality Date  . Ptca      stent  . Colon biopsy  08/12/2008  . Tubal ligation  1982  . Cesarean section      x 3  . Carpal tunnel  release     Allergies:  Prednisone; Imdur; Lactose intolerance (gi); Nitroglycerin; and Zoloft Prior to Admit Meds:   Prior to Admission medications   Medication Sig Start Date End Date Taking? Authorizing Provider  acetaminophen (TYLENOL) 650 MG CR tablet Take 650 mg by mouth every 8 (eight) hours as needed for pain.    Historical Provider, MD  albuterol (PROVENTIL) (2.5 MG/3ML) 0.083% nebulizer solution Take 3 mLs (2.5 mg total) by nebulization every 6 (six) hours as needed for wheezing or shortness of breath. 03/06/13   Encarnacion Slates, NP  amLODipine (NORVASC) 2.5 MG tablet Take 1 tablet (2.5 mg total) by mouth daily. For hypertension 08/14/13   Minus Breeding, MD  aspirin EC 81 MG tablet Take 1 tablet (81 mg total) by mouth every morning. For heart health 03/06/13   Encarnacion Slates, NP  atorvastatin (LIPITOR) 40 MG tablet Take 1 tablet (40 mg total) by mouth daily with supper. For high cholesterol control 08/14/13   Minus Breeding, MD  clonazePAM (KLONOPIN) 1 MG tablet Take 1 mg by mouth 2 (two) times daily.    Historical Provider, MD  clopidogrel (PLAVIX) 75 MG tablet Take 1 tablet (75 mg total) by mouth  daily with supper. For heart condition 08/14/13   Minus Breeding, MD  [facility] NICOTINE POLACRILEX 4 MG lozenge TAKE 1 LOZENGE BY MOUTH AS NEEDED FOR SMOKING CESSATION 04/15/14   Elsie Stain, MD  dicyclomine (BENTYL) 10 MG capsule Take 10 mg by mouth every 6 (six) hours as needed (abdominal pain).    Historical Provider, MD  DULoxetine (CYMBALTA) 30 MG capsule Take 30 mg by mouth daily.    Historical Provider, MD  esomeprazole (NEXIUM) 40 MG capsule Take 40 mg by mouth daily. For acid reflux 03/06/13   Encarnacion Slates, NP  furosemide (LASIX) 20 MG tablet Take 20 mg by mouth daily. 08/14/13   Minus Breeding, MD  levothyroxine (SYNTHROID) 50 MCG tablet Take 50 mcg by mouth daily before breakfast.    Historical Provider, MD  losartan (COZAAR) 50 MG tablet Take 1 tablet (50 mg total) by mouth daily. For  hypertension 08/14/13   Minus Breeding, MD  metoprolol tartrate (LOPRESSOR) 25 MG tablet Take 25 mg by mouth 2 (two) times daily.  11/08/13   Minus Breeding, MD  Multiple Vitamins-Minerals (MULTIVITAMIN WITH MINERALS) tablet Take 1 tablet by mouth every morning. For low vitamin 03/06/13   Encarnacion Slates, NP  nitroGLYCERIN (NITROSTAT) 0.4 MG SL tablet Place 1 tablet (0.4 mg total) under the tongue every 5 (five) minutes as needed for chest pain. 06/21/13   Minus Breeding, MD  ondansetron (ZOFRAN-ODT) 4 MG disintegrating tablet Take 4-8 mg by mouth every 6 (six) hours as needed for nausea or vomiting.    Historical Provider, MD  PROAIR HFA 108 (90 BASE) MCG/ACT inhaler USE 2 PUFFS EVERY 4 HOURS AS NEEDED FOR WHEEZING (SHORTNESS OF BREATH) 03/04/14   Elsie Stain, MD  traMADol (ULTRAM) 50 MG tablet Take 100 mg by mouth every 6 (six) hours as needed.    Historical Provider, MD  trazodone (DESYREL) 300 MG tablet Take 300 mg by mouth at bedtime.    Historical Provider, MD   Fam HX:    Family History  Problem Relation Age of Onset  . Coronary artery disease Mother   . Emphysema Mother   . Colon cancer Father   . Prostate cancer Father    Social HX:    History   Social History  . Marital Status: Divorced    Spouse Name: N/A    Number of Children: 3  . Years of Education: N/A   Occupational History  .      Workmen's comp   Social History Main Topics  . Smoking status: Former Smoker -- 1.00 packs/day    Types: Cigarettes    Quit date: 02/09/2014  . Smokeless tobacco: Never Used  . Alcohol Use: No     Comment: rarely  . Drug Use: No  . Sexual Activity: Not Currently   Other Topics Concern  . Not on file   Social History Narrative  . No narrative on file     ROS:  All 11 ROS were addressed and are negative except what is stated in the HPI   Physical Exam: Blood pressure 147/93, pulse 65, temperature 97.8 F (36.6 C), temperature source Oral, resp. rate 17, height 5\' 2"  (1.575 m),  weight 97.2 kg (214 lb 4.6 oz), SpO2 100 %.   General: pt is alert, awake and oriented.  Oropharynx - dry mucosa Head: PERRL EOMI  Lungs:  Ctab, mild wheezing Heart:  Rrr, s1s2 heard, 2/6 SEM. Abdomen: obese, +bs, nd, nt Msk:  Back normal, Normal strength  and tone for age. Extremities:  Trace edema, No clubbing, cyanosis.  DP +1 Neuro: Alert and oriented X 3, non-focal, MAE x 4 GU: Deferred Rectal: Deferred Psych:  Labile mood, responds appropriately         Labs:   Lab Results  Component Value Date   WBC 6.8 07/24/2013   HGB 13.2 07/24/2013   HCT 39.3 07/24/2013   MCV 105.9* 07/24/2013   PLT 238 07/24/2013   No results for input(s): NA, K, CL, CO2, BUN, CREATININE, CALCIUM, PROT, BILITOT, ALKPHOS, ALT, AST, GLUCOSE in the last 168 hours.  Invalid input(s): LABALBU No results for input(s): CKTOTAL, CKMB, TROPONINI in the last 72 hours. Lab Results  Component Value Date   CHOL 145 02/22/2013   HDL 51 02/22/2013   LDLCALC 71 02/22/2013   TRIG 116 02/22/2013   No results found for: [facility] ([facility])   Radiology:  No results found. Personally viewed.   EKG: NSR with PVC   CAD -    a. s/p BMS to RCA and LCX 2008; b. ISR of RCA rx'd with Xience DES 12/09; c. Requires extensive sedation for cath; d. cath 11/10: nonobs; e.Cath 2/12: nonobs; f. 05/2011 Cath: nonobs; g. 02/2013 nonobs, 07/2013 nonobstructive, EF 60-65%.     ASSESSMENT - 58 y.o female with CAD and 07/2013 nonobstructive, EF 60-65% who presents with symptoms concerning for angina. No objective signs of ischemia at this time. However, labs here are pending. Recent cath with non-obstructive CAD.   PLAN:  Angina - Pt requests for Dr. Percival Spanish to see the patient. Pt request morphine for chest pain control.  - Rule out ACS - previously pt has been unable to tolerate nitrate due to hypotension - titrate up BB for anti-anginal therapy - cont ASA, plavix, BB, CCB - pt dry on exam - hold lasix.   Hypertension - cont  home norvasc, BB, losartan  Mood disroder - cont home klonopin, cymbalta  FEN - IVF at 150cc/hr for 12 hours - electrolyte replacement prn - Prudent diet  Orson Gear, MD  05/11/2014  12:52 AM

## 2014-05-12 DIAGNOSIS — F319 Bipolar disorder, unspecified: Secondary | ICD-10-CM

## 2014-05-12 DIAGNOSIS — R072 Precordial pain: Secondary | ICD-10-CM

## 2014-05-12 LAB — T3, FREE: T3 FREE: 2.9 pg/mL (ref 2.3–4.2)

## 2014-05-12 MED ORDER — DSS 100 MG PO CAPS: 100.0000 mg | ORAL_CAPSULE | Freq: Two times a day (BID) | ORAL | Status: DC | PRN

## 2014-05-12 NOTE — Progress Notes (Signed)
Patient refuses Ntg paste, states she is allergic to NTG gtt and will not take it. States she made MD aware that she did not want to take it. She also refuses herparin SQ shots, states they cause her to have large bruises and are too painful and she will not take it. Patient was educated on importance of both of these medications and she continues to refuse them. Dr. [facility] [employer]

## 2014-05-12 NOTE — Discharge Instructions (Signed)
Please see your primary MD- Dr. Lin Landsman to further eval your thyroid.   Heart Healthy diet.  We will schedule you an appointment with Dr. Percival Spanish in several weeks.

## 2014-05-12 NOTE — Clinical Social Work Note (Signed)
CSW made aware by patient's RN Mickel Baas, patient needs assistance with transportation home. CSW met with patient and verified address as patient states she lives in [place] and has no one to transport her home. Patient agreeable to EMS transportation. CSW to arrange transportation via [place]. No further needs. CSW signing off.  [place], [place] Clinical Social Worker [phone]

## 2014-05-12 NOTE — Progress Notes (Signed)
Patient Name: Kara Lamb      SUBJECTIVE:58 y.o female with MI s/p PCI , chronic angina, abnormal NM stress with lateral wall inducible ischemia 07/2013, latest cath with small vessel and non-obstructive ASCAD, hypertension, mood disorder, COPD who presents with chief complaint of chest pain.  Tn neg despite hours of pain   Now feeling better  No further chest pain    CAth    a. s/p BMS to RCA and LCX 2008; b. ISR of RCA rx'd with Xience DES 12/09; c. Requires extensive sedation for cath; d. cath 11/10: nonobs; e.Cath 2/12: nonobs; f. 05/2011 Cath: nonobs; g. 02/2013 nonobs, 07/2013 nonobstructive, EF 60-65%.  Cath was undertaken following abnormal Myoview   It showed no change    Past Medical History  Diagnosis Date  . CAD (coronary artery disease)     a. s/p BMS to RCA and LCX 2008; b. ISR of RCA rx'd with Xience DES 12/09; c. Requires extensive sedation for cath; d. cath 11/10: nonobs; e.Cath 2/12: nonobs; f. 05/2011 Cath: nonobs; g. 02/2013 nonobs, 07/2013 nonobstructive, EF 60-65%.  . Chronic chest pain   . Anxiety and depression   . H/O: suicide attempt     drug overdose 3/08  . Hypertension   . Obesity   . Tobacco abuse     a. quit in 2012  . H/O ETOH abuse   . Arthritis   . Hyperlipidemia   . Colitis   . GERD (gastroesophageal reflux disease)   . Complication of anesthesia   . Asthma     Scheduled Meds:  Scheduled Meds: . amLODipine  2.5 mg Oral Daily  . aspirin EC  81 mg Oral Daily  . atorvastatin  40 mg Oral Q supper  . clonazePAM  1 mg Oral BID  . clopidogrel  75 mg Oral Q supper  . docusate sodium  100 mg Oral BID  . DULoxetine  30 mg Oral Daily  . heparin  5,000 Units Subcutaneous 3 times per day  . levothyroxine  50 mcg Oral QAC breakfast  . losartan  50 mg Oral Daily  . metoprolol tartrate  25 mg Oral BID  . nitroGLYCERIN  0.5 inch Topical 4 times per day  . pantoprazole  40 mg Oral Daily  . sodium chloride  3 mL Intravenous Q12H  .  sodium chloride  3 mL Intravenous Q12H  . trazodone  300 mg Oral QHS   Continuous Infusions:  sodium chloride, acetaminophen, albuterol, dicyclomine, morphine injection, nitroGLYCERIN, ondansetron **OR** ondansetron (ZOFRAN) IV, sodium chloride, traMADol, zolpidem    PHYSICAL EXAM Filed Vitals:   05/11/14 0800 05/11/14 1143 05/11/14 2054 05/12/14 0515  BP: 105/66 102/50 93/69 108/67  Pulse:  57 76 76  Temp: 97.5 F (36.4 C)  98.2 F (36.8 C) 98.6 F (37 C)  TempSrc: Oral  Oral   Resp: 15 20 18 18   Height:      Weight:    220 lb (99.791 kg)  SpO2: 99% 100% 99% 95%   Well developed and nourished in no acute distress HENT normal Neck supple with JVP-flat Clear Regular rate and rhythm, no murmurs or gallops Abd-soft with active BS No Clubbing cyanosis edema Skin-warm and dry A & Oriented  Grossly normal sensory and motor function Emotionally labile   TELEMETRY: Reviewed telemetry pt in NSR:    Intake/Output Summary (Last 24 hours) at 05/12/14 0951 Last data filed at 05/11/14 2200  Gross per 24 hour  Intake  720 ml  Output   1300 ml  Net   -580 ml    LABS: Basic Metabolic Panel:  Recent Labs Lab 05/11/14 0100  NA 137  K 3.9  CL 98  CO2 25  GLUCOSE 103*  BUN 10  CREATININE 0.69  CALCIUM 9.6  MG 1.6   Cardiac Enzymes:  Recent Labs  05/11/14 0100 05/11/14 0633 05/11/14 1200  TROPONINI <0.30 <0.30 <0.30   CBC:  Recent Labs Lab 05/11/14 0100  WBC 11.9*  NEUTROABS 7.2  HGB 13.1  HCT 39.5  MCV 100.8*  PLT 218   PROTIME: No results for input(s): LABPROT, INR in the last 72 hours. Liver Function Tests:  Recent Labs  05/11/14 0100  AST 29  ALT 23  ALKPHOS 103  BILITOT 0.5  PROT 6.9  ALBUMIN 3.7   No results for input(s): LIPASE, AMYLASE in the last 72 hours. BNP: BNP (last 3 results)  Recent Labs  07/23/13 2110 05/11/14 0100  PROBNP 12.6 227.0*   D-Dimer: No results for input(s): DDIMER in the last 72 hours. Hemoglobin  A1C: No results for input(s): HGBA1C in the last 72 hours. Fasting Lipid Panel: No results for input(s): CHOL, HDL, LDLCALC, TRIG, CHOLHDL, LDLDIRECT in the last 72 hours. Thyroid Function Tests:  Recent Labs  05/11/14 0100  TSH 12.720*   Anemia Panel:  T4 is normal and t3 is pending  ASSESSMENT AND PLAN:  Active Problems:   Bipolar 1 disorder   CAD (coronary artery disease)   Chest pain   Acute confusional state her chest pain syndrome is without ovbjective evidence of ischemia  With hx of false postitive myoview i would not pursue myoview scanning at this juncture and would avoid cath without objective data  We will discharge today in the absence of objective data;  She describes a "long and drawn out hospitalization"  This confuses me!  She has no way to get home to [place] so will get social services involved  F/u with PCP  No new cardiac followup needed  Will check T3 and T4    Signed, Virl Axe MD  05/12/2014

## 2014-05-12 NOTE — Discharge Summary (Signed)
Physician Discharge Summary       Patient ID: Kara Lamb MRN: [id number] DOB/AGE: 58-May-1957 58 y.o.  Admit date: 05/10/2014 Discharge date: 05/12/2014  Discharge Diagnoses:  Principal Problem:   Chest pain, negative MI Active Problems:   CAD (coronary artery disease)   Bipolar 1 disorder   Acute confusional state   Discharged Condition: fair  Procedures: none Primary Cardiologist: Dr. Maximiano Coss Course: 58 y.o female with MI s/p PCI , chronic angina, abnormal NM stress with lateral wall inducible ischemia 07/2013, latest cath with small vessel and non-obstructive ASCAD, hypertension, mood disorder, COPD who presents with chief complaint of chest pain.   Pt was watching TV at about 4pm  On the 27th and had sudden onset of chest pain. Left sided chest pain, rated 9/10, radiating to her jaw. Associated with cold sweats and shortness of breath. She took SL NTG X 2 without relief in her symptoms. She waited three house before calling EMS. Pt did have nausea, and surprisingly she had urinary and stool incontinence. Pt also had diarrhea. Pt has one episode two weeks ago where her pain was similar in nature but resolved after an hour and after taking 2 SL NTG. She exerts less than 4 METs. She has chronic confusion but lives in her home.  [facility] OSH data - VSS, Trop 0.11, CBC wnl, INE 1, GFR> 60, K 3.6, Glc 125, AST 41. Pt started on ntg gtt and heparin gtt at outside hospital. CXR without acute findings,   Here all enzymes have been negative. TSH elevated at 12.7 with free T4 0.91. After no further chest pain and with hx of false + myoview in the past, no further testing was undertaken.  Pt was refusing heparin and NTG in the hospital.  No cath without objective data.   She has no was to go home to [place], [place] so Education officer, museum will assist.  Consults: None  Significant Diagnostic Studies:  BMET    Component Value Date/Time   NA 137 05/11/2014 0100   K 3.9  05/11/2014 0100   CL 98 05/11/2014 0100   CO2 25 05/11/2014 0100   GLUCOSE 103* 05/11/2014 0100   BUN 10 05/11/2014 0100   CREATININE 0.69 05/11/2014 0100   CALCIUM 9.6 05/11/2014 0100   GFRNONAA >90 05/11/2014 0100   GFRAA >90 05/11/2014 0100     CBC    Component Value Date/Time   WBC 11.9* 05/11/2014 0100   RBC 3.92 05/11/2014 0100   HGB 13.1 05/11/2014 0100   HCT 39.5 05/11/2014 0100   PLT 218 05/11/2014 0100   MCV 100.8* 05/11/2014 0100   MCH 33.4 05/11/2014 0100   MCHC 33.2 05/11/2014 0100   RDW 12.3 05/11/2014 0100   LYMPHSABS 3.6 05/11/2014 0100   MONOABS 0.8 05/11/2014 0100   EOSABS 0.1 05/11/2014 0100   BASOSABS 0.1 05/11/2014 0100    Troponin at [facility]  <0.30 X 3, pro BNP 227 TSH 12.7- will need follow up with PCP  CHEST 2 VIEW COMPARISON: 05/10/2014 FINDINGS: Cardiomediastinal silhouette is stable. No acute infiltrate or pleural effusion. No pulmonary edema. Osteopenia and mild degenerative changes thoracic spine. IMPRESSION: No active disease. Osteopenia and mild degenerative changes thoracic spine  EKG: Sinus rhythm with Premature atrial complexes with Abberant conduction Otherwise normal ECG Left axis deviation Non-specific ST-t changes No significant change since last tracing 05/11/14   Discharge Exam: Blood pressure 108/67, pulse 76, temperature 98.6 F (37 C), temperature source Oral, resp. rate 18,  height 5\' 2"  (1.575 m), weight 220 lb (99.791 kg), SpO2 95 %.  Disposition: 01-Home or Self Care     Medication List    TAKE these medications        acetaminophen 650 MG CR tablet  Commonly known as:  TYLENOL  Take 650 mg by mouth every 8 (eight) hours as needed for pain.     amLODipine 2.5 MG tablet  Commonly known as:  NORVASC  Take 1 tablet (2.5 mg total) by mouth daily. For hypertension     aspirin EC 81 MG tablet  Take 1 tablet (81 mg total) by mouth every morning. For heart health     atorvastatin 40 MG tablet  Commonly  known as:  LIPITOR  Take 1 tablet (40 mg total) by mouth daily with supper. For high cholesterol control     clonazePAM 0.5 MG tablet  Commonly known as:  KLONOPIN  Take 0.5 mg by mouth 3 (three) times daily.     clopidogrel 75 MG tablet  Commonly known as:  PLAVIX  Take 1 tablet (75 mg total) by mouth daily with supper. For heart condition     [facility] NICOTINE POLACRILEX 4 MG lozenge  Generic drug:  nicotine polacrilex  TAKE 1 LOZENGE BY MOUTH AS NEEDED FOR SMOKING CESSATION     dicyclomine 10 MG capsule  Commonly known as:  BENTYL  Take 10 mg by mouth every 6 (six) hours as needed (abdominal pain).     DSS 100 MG Caps  Take 100 mg by mouth 2 (two) times daily as needed for mild constipation or moderate constipation.     DULoxetine 30 MG capsule  Commonly known as:  CYMBALTA  Take 30 mg by mouth daily.     DULoxetine 60 MG capsule  Commonly known as:  CYMBALTA  Take 60 mg by mouth daily.     esomeprazole 40 MG capsule  Commonly known as:  NEXIUM  Take 40 mg by mouth daily. For acid reflux     furosemide 20 MG tablet  Commonly known as:  LASIX  Take 20 mg by mouth daily.     losartan 50 MG tablet  Commonly known as:  COZAAR  Take 1 tablet (50 mg total) by mouth daily. For hypertension     metoprolol tartrate 25 MG tablet  Commonly known as:  LOPRESSOR  Take 25 mg by mouth 2 (two) times daily.     nitroGLYCERIN 0.4 MG SL tablet  Commonly known as:  NITROSTAT  Place 1 tablet (0.4 mg total) under the tongue every 5 (five) minutes as needed for chest pain.     PROAIR HFA 108 (90 BASE) MCG/ACT inhaler  Generic drug:  albuterol  USE 2 PUFFS EVERY 4 HOURS AS NEEDED FOR WHEEZING (SHORTNESS OF BREATH)     SYNTHROID 50 MCG tablet  Generic drug:  levothyroxine  Take 50 mcg by mouth daily before breakfast.     traMADol 50 MG tablet  Commonly known as:  ULTRAM  Take 100 mg by mouth every 6 (six) hours as needed (knee back pain).     traZODone 100 MG tablet  Commonly  known as:  DESYREL  Take 200 mg by mouth at bedtime.       Follow-up Information    Follow up with Minus Breeding, MD.   Specialty:  Cardiology   Why:  the office will call with date and time.   Contact information:   [place]  [id number] [phone]        Discharge Instructions: Please see your primary MD- Dr. Lin Landsman to further eval your thyroid.   Heart Healthy diet.  We will schedule you an appointment with Dr. Percival Spanish in several weeks.  Signed: Isaiah Serge Nurse Practitioner-Certified [facility]: [facility] 05/12/2014, 11:19 AM  Time spent on discharge :> 30 minutes.

## 2014-05-12 NOTE — Plan of Care (Signed)
Problem: Discharge Progression Outcomes Goal: No anginal pain Outcome: Completed/Met Date Met:  05/12/14 Goal: Hemodynamically stable Outcome: Completed/Met Date Met:  58/44/17 Goal: Complications resolved/controlled Outcome: Completed/Met Date Met:  05/12/14 Goal: Barriers To Progression Addressed/Resolved Outcome: Completed/Met Date Met:  05/12/14 Goal: Discharge plan in place and appropriate Outcome: Completed/Met Date Met:  05/12/14 Goal: Vascular site scale level 0 - I Vascular Site Scale Level 0: No bruising/bleeding/hematoma Level I (Mild): Bruising/Ecchymosis, minimal bleeding/ooozing, palpable hematoma < 3 cm Level II (Moderate): Bleeding not affecting hemodynamic parameters, pseudoaneurysm, palpable hematoma > 3 cm Level III (Severe) Bleeding which affects hemodynamic parameters or retroperitoneal hemorrhage  Outcome: Not Applicable Date Met:  12/78/71 Goal: Tolerates diet Outcome: Completed/Met Date Met:  05/12/14 Goal: Activity appropriate for discharge plan Outcome: Completed/Met Date Met:  05/12/14 Goal: Other Discharge Outcomes/Goals Outcome: Completed/Met Date Met:  05/12/14

## 2014-05-13 ENCOUNTER — Telehealth: Payer: Self-pay | Admitting: Cardiology

## 2014-05-13 MED FILL — Heparin Sodium (Porcine) 100 Unt/ML in Sodium Chloride 0.45%: INTRAMUSCULAR | Qty: 250 | Status: AC

## 2014-05-15 NOTE — Telephone Encounter (Signed)
Closed encounter °

## 2014-05-22 ENCOUNTER — Encounter (HOSPITAL_COMMUNITY): Payer: Self-pay | Admitting: Cardiovascular Disease

## 2014-05-23 ENCOUNTER — Encounter (HOSPITAL_COMMUNITY): Payer: Self-pay | Admitting: Cardiovascular Disease

## 2014-05-31 ENCOUNTER — Ambulatory Visit: Payer: Self-pay | Admitting: Cardiology

## 2014-07-07 ENCOUNTER — Other Ambulatory Visit: Payer: Self-pay | Admitting: Cardiology

## 2014-07-07 ENCOUNTER — Other Ambulatory Visit: Payer: Self-pay | Admitting: *Deleted

## 2014-07-07 MED ORDER — FUROSEMIDE 20 MG PO TABS: 20.0000 mg | ORAL_TABLET | Freq: Every day | ORAL | Status: DC

## 2014-07-07 NOTE — Telephone Encounter (Signed)
Rx(s) sent to pharmacy electronically.  

## 2014-07-09 ENCOUNTER — Ambulatory Visit: Payer: Self-pay | Admitting: Cardiology

## 2014-07-23 ENCOUNTER — Encounter (HOSPITAL_COMMUNITY): Payer: Self-pay

## 2014-07-23 ENCOUNTER — Emergency Department (HOSPITAL_COMMUNITY)
Admission: EM | Admit: 2014-07-23 | Discharge: 2014-07-24 | Disposition: A | Discharge status: Home / Self Care | Payer: Medicare Other | Attending: Emergency Medicine | Admitting: Emergency Medicine

## 2014-07-23 DIAGNOSIS — Z9861 Coronary angioplasty status: Secondary | ICD-10-CM | POA: Diagnosis not present

## 2014-07-23 DIAGNOSIS — F411 Generalized anxiety disorder: Secondary | ICD-10-CM | POA: Diagnosis present

## 2014-07-23 DIAGNOSIS — M1990 Unspecified osteoarthritis, unspecified site: Secondary | ICD-10-CM | POA: Insufficient documentation

## 2014-07-23 DIAGNOSIS — Z720 Tobacco use: Secondary | ICD-10-CM | POA: Diagnosis not present

## 2014-07-23 DIAGNOSIS — T50912A Poisoning by multiple unspecified drugs, medicaments and biological substances, intentional self-harm, initial encounter: Secondary | ICD-10-CM

## 2014-07-23 DIAGNOSIS — T424X2A Poisoning by benzodiazepines, intentional self-harm, initial encounter: Secondary | ICD-10-CM | POA: Diagnosis not present

## 2014-07-23 DIAGNOSIS — E669 Obesity, unspecified: Secondary | ICD-10-CM | POA: Diagnosis not present

## 2014-07-23 DIAGNOSIS — T43292A Poisoning by other antidepressants, intentional self-harm, initial encounter: Secondary | ICD-10-CM | POA: Insufficient documentation

## 2014-07-23 DIAGNOSIS — E785 Hyperlipidemia, unspecified: Secondary | ICD-10-CM | POA: Insufficient documentation

## 2014-07-23 DIAGNOSIS — I10 Essential (primary) hypertension: Secondary | ICD-10-CM | POA: Insufficient documentation

## 2014-07-23 DIAGNOSIS — Z79899 Other long term (current) drug therapy: Secondary | ICD-10-CM | POA: Insufficient documentation

## 2014-07-23 DIAGNOSIS — J45909 Unspecified asthma, uncomplicated: Secondary | ICD-10-CM | POA: Insufficient documentation

## 2014-07-23 DIAGNOSIS — I2510 Atherosclerotic heart disease of native coronary artery without angina pectoris: Secondary | ICD-10-CM | POA: Insufficient documentation

## 2014-07-23 DIAGNOSIS — T461X2A Poisoning by calcium-channel blockers, intentional self-harm, initial encounter: Secondary | ICD-10-CM | POA: Insufficient documentation

## 2014-07-23 DIAGNOSIS — Z7982 Long term (current) use of aspirin: Secondary | ICD-10-CM | POA: Insufficient documentation

## 2014-07-23 DIAGNOSIS — N61 Inflammatory disorders of breast: Secondary | ICD-10-CM | POA: Insufficient documentation

## 2014-07-23 DIAGNOSIS — K219 Gastro-esophageal reflux disease without esophagitis: Secondary | ICD-10-CM | POA: Insufficient documentation

## 2014-07-23 DIAGNOSIS — T465X2A Poisoning by other antihypertensive drugs, intentional self-harm, initial encounter: Secondary | ICD-10-CM | POA: Diagnosis not present

## 2014-07-23 DIAGNOSIS — T50902A Poisoning by unspecified drugs, medicaments and biological substances, intentional self-harm, initial encounter: Secondary | ICD-10-CM

## 2014-07-23 DIAGNOSIS — T503X2A Poisoning by electrolytic, caloric and water-balance agents, intentional self-harm, initial encounter: Secondary | ICD-10-CM | POA: Insufficient documentation

## 2014-07-23 DIAGNOSIS — Z7902 Long term (current) use of antithrombotics/antiplatelets: Secondary | ICD-10-CM | POA: Diagnosis not present

## 2014-07-23 DIAGNOSIS — R1011 Right upper quadrant pain: Secondary | ICD-10-CM | POA: Diagnosis not present

## 2014-07-23 DIAGNOSIS — G8929 Other chronic pain: Secondary | ICD-10-CM | POA: Diagnosis not present

## 2014-07-23 DIAGNOSIS — Z9889 Other specified postprocedural states: Secondary | ICD-10-CM | POA: Insufficient documentation

## 2014-07-23 DIAGNOSIS — Z008 Encounter for other general examination: Secondary | ICD-10-CM

## 2014-07-23 LAB — CBC
HEMATOCRIT: 43.1 % (ref 36.0–46.0)
HEMOGLOBIN: 14.5 g/dL (ref 12.0–15.0)
MCH: 35.2 pg — AB (ref 26.0–34.0)
MCHC: 33.6 g/dL (ref 30.0–36.0)
MCV: 104.6 fL — ABNORMAL HIGH (ref 78.0–100.0)
Platelets: 274 10*3/uL (ref 150–400)
RBC: 4.12 MIL/uL (ref 3.87–5.11)
RDW: 12.5 % (ref 11.5–15.5)
WBC: 11.3 10*3/uL — AB (ref 4.0–10.5)

## 2014-07-23 LAB — COMPREHENSIVE METABOLIC PANEL
ALK PHOS: 97 U/L (ref 39–117)
ALT: 36 U/L — ABNORMAL HIGH (ref 0–35)
ANION GAP: 13 (ref 5–15)
AST: 48 U/L — ABNORMAL HIGH (ref 0–37)
Albumin: 4.5 g/dL (ref 3.5–5.2)
BILIRUBIN TOTAL: 0.5 mg/dL (ref 0.3–1.2)
BUN: 10 mg/dL (ref 6–23)
CALCIUM: 10 mg/dL (ref 8.4–10.5)
CO2: 25 mmol/L (ref 19–32)
CREATININE: 0.71 mg/dL (ref 0.50–1.10)
Chloride: 102 mmol/L (ref 96–112)
GFR calc non Af Amer: >90 mL/min (ref >90)
Glucose, Bld: 149 mg/dL — ABNORMAL HIGH (ref 70–99)
Potassium: 3.1 mmol/L — ABNORMAL LOW (ref 3.5–5.1)
Sodium: 140 mmol/L (ref 135–145)
TOTAL PROTEIN: 7.8 g/dL (ref 6.0–8.3)

## 2014-07-23 LAB — URINALYSIS, ROUTINE W REFLEX MICROSCOPIC
Bilirubin Urine: NEGATIVE
Glucose, UA: NEGATIVE mg/dL
Hgb urine dipstick: NEGATIVE
KETONES UR: NEGATIVE mg/dL
Leukocytes, UA: NEGATIVE
NITRITE: NEGATIVE
Protein, ur: NEGATIVE mg/dL
SPECIFIC GRAVITY, URINE: 1.015 (ref 1.005–1.030)
UROBILINOGEN UA: 1 mg/dL (ref 0.0–1.0)
pH: 6 (ref 5.0–8.0)

## 2014-07-23 LAB — RAPID URINE DRUG SCREEN, HOSP PERFORMED
Amphetamines: NOT DETECTED
Barbiturates: NOT DETECTED
Benzodiazepines: POSITIVE — AB
Cocaine: NOT DETECTED
OPIATES: NOT DETECTED
TETRAHYDROCANNABINOL: NOT DETECTED

## 2014-07-23 LAB — I-STAT TROPONIN, ED: Troponin i, poc: 0 ng/mL (ref 0.00–0.08)

## 2014-07-23 LAB — LIPASE, BLOOD: Lipase: 52 U/L (ref 11–59)

## 2014-07-23 LAB — ACETAMINOPHEN LEVEL: Acetaminophen (Tylenol), Serum: <10 ug/mL — ABNORMAL LOW (ref 10–30)

## 2014-07-23 LAB — SALICYLATE LEVEL

## 2014-07-23 LAB — ETHANOL: Alcohol, Ethyl (B): <5 mg/dL (ref 0–9)

## 2014-07-23 MED ORDER — POTASSIUM CHLORIDE CRYS ER 20 MEQ PO TBCR
40.0000 meq | EXTENDED_RELEASE_TABLET | Freq: Once | ORAL | Status: AC
  Administered 2014-07-23: 40 meq via ORAL
  Filled 2014-07-23: qty 2

## 2014-07-23 MED ORDER — CEPHALEXIN 500 MG PO CAPS
500.0000 mg | ORAL_CAPSULE | Freq: Four times a day (QID) | ORAL | Status: DC
  Administered 2014-07-23 – 2014-07-24 (×4): 500 mg via ORAL
  Filled 2014-07-23 (×4): qty 1

## 2014-07-23 MED ORDER — NICOTINE 21 MG/24HR TD PT24
21.0000 mg | MEDICATED_PATCH | Freq: Every day | TRANSDERMAL | Status: DC
  Administered 2014-07-23 – 2014-07-24 (×2): 21 mg via TRANSDERMAL
  Filled 2014-07-23 (×2): qty 1

## 2014-07-23 MED ORDER — CEPHALEXIN 500 MG PO CAPS: 1000.0000 mg | ORAL_CAPSULE | Freq: Once | ORAL | Status: DC

## 2014-07-23 MED ORDER — FAMOTIDINE 20 MG PO TABS
20.0000 mg | ORAL_TABLET | Freq: Once | ORAL | Status: AC
  Administered 2014-07-23: 20 mg via ORAL
  Filled 2014-07-23: qty 1

## 2014-07-23 MED ORDER — SODIUM CHLORIDE 0.9 % IV BOLUS (SEPSIS)
1000.0000 mL | Freq: Once | INTRAVENOUS | Status: AC
Start: 2014-07-23 — End: 2014-07-23
  Administered 2014-07-23: 1000 mL via INTRAVENOUS
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
## 2014-07-23 NOTE — Progress Notes (Signed)
CSW faxed patient referral to the following facilities: [facility], [place], [facility], [place], [facility], [place], [place], [place], and [facility].   Will continue to seek placement.  Verlon Setting, [place] Disposition staff 07/23/2014 8:16 PM

## 2014-07-23 NOTE — ED Notes (Signed)
Pt medication taken to pharmacy to be held, paper in chart

## 2014-07-23 NOTE — BH Assessment (Signed)
Writer informed TTS Arby Barrette) of the consult.

## 2014-07-23 NOTE — ED Notes (Signed)
Patient reports that she use oxygen at night. Anderson Malta, CN informed and report given to Ander Purpura, RN in [place].

## 2014-07-23 NOTE — ED Notes (Addendum)
Pt c/o RUQ abdominal pain, chest pain, and diarrhea x 1 day and SI w/ plan to OD on prescribed medications.  Pain score 7/10.  Pt reports SI attempt x 3 day ago by crushing up medications and trying to OD.  Sts an additional attempt x 3 weeks ago.  Pt reports recent stressors of family dysfunction and chronically ill grandson.  Sts "I promised my therapist that I would not try again."  Pt is followed by a PA at [facility].

## 2014-07-23 NOTE — ED Notes (Signed)
Spoke with Kara Lamb from [facility], updated on pt's condition and states that no further observation is needed from their end.

## 2014-07-23 NOTE — ED Notes (Addendum)
Explained to PA she can be monitored in TCU

## 2014-07-23 NOTE — ED Notes (Signed)
Pt reports that she took approximately 30 Trazodone and "the rest" of her Metoprolol, potassium, Xanax and "other heart meds" on Saturday after reading that other family members were welcomed to see her chronically ill grandson, but her daughter will not allow pt to see him. Pt states that she awoke Monday morning "and I hated that I was still alive but feared I damaged my heart. I did this a few weeks ago and called the ambulance because my heart was beating out of my chest. I didn't tell them what I had done. It's awful to lie like that." Pt often becomes tearful and will ask writer "Am I annoying you?" Encouragement provided as well as a warm blanket and fluids. Pt requested food be removed from room. Denies further need at this time.

## 2014-07-23 NOTE — ED Provider Notes (Signed)
CSN: [id number]     Arrival date & time 07/23/14  1416 History   First MD Initiated Contact with Patient 07/23/14 1514     Chief Complaint  Patient presents with  . Abdominal Pain  . Suicidal     (Consider location/radiation/quality/duration/timing/severity/associated sxs/prior Treatment) HPI  Kara Lamb  is a 59 year old female with a history of obesity, alcohol abuse, suicide attempts with multiple hospitalizations as well as inpatient psychiatric treatment. She presents today for severe depression, suicide attempt, and abdominal pain. The patient attributes her recent suicide attempt and severe depression , poor relationship between her and her children and grandchildren. Dorsalis feelings of abandonment from her children who are on interested at this point in pursuing relationship with her. She also has a grandson who is apparently dying from cancer. She states he has been given only hours to live. She feels that she is unsure of what she will do to herself if she hears he passes since she has been unable to speak to him for the past 9 years. The patient states that this past Saturday. She crushed up all of her amlodipine which is approximately 25 pills and she also took multiple other medications in an attempt to kill herself. She states that she slept approximately 24-30 hours and when she awoke she was mad that she had not succeeded in her attempt. She states she also thought about plunging a knife into her chest. Patient states that when she woke yesterday morning she began having severe right upper quadrant abdominal pain. She had some associated nausea without vomiting. She states she had been constipated but was able to move her bowels after the suicide attempt. The patient's only abdominal surgeries include cesarean sections. She endorses chronic chest pain. She states there is no change in the chest pain. She is endorsing urinary frequency and she states she's had a 30 pound weight gain  over the past approximately 2 years. The patient also complains of a recent skin infection under the right breast. Breast. She states that today she was able to drain purulent discharge. The patient denies audiovisual hallucinations, homicidal ideation, alcohol or drug use. She states that she feels useless and unloved and does not see a reason to continue living.  Past Medical History  Diagnosis Date  . CAD (coronary artery disease)     a. s/p BMS to RCA and LCX 2008; b. ISR of RCA rx'd with Xience DES 12/09; c. Requires extensive sedation for cath; d. cath 11/10: nonobs; e.Cath 2/12: nonobs; f. 05/2011 Cath: nonobs; g. 02/2013 nonobs, 07/2013 nonobstructive, EF 60-65%.  . Chronic chest pain   . Anxiety and depression   . H/O: suicide attempt     drug overdose 3/08  . Hypertension   . Obesity   . Tobacco abuse     a. quit in 2012  . H/O ETOH abuse   . Arthritis   . Hyperlipidemia   . Colitis   . GERD (gastroesophageal reflux disease)   . Complication of anesthesia   . Asthma    Past Surgical History  Procedure Laterality Date  . Ptca      stent  . Colon biopsy  08/12/2008  . Tubal ligation  1982  . Cesarean section      x 3  . Carpal tunnel release    . Left heart catheterization with coronary angiogram  06/04/2011    Procedure: LEFT HEART CATHETERIZATION WITH CORONARY ANGIOGRAM;  Surgeon: Burnell Blanks, MD;  Location: [facility]  CATH LAB;  Service: Cardiovascular;;  . Left heart catheterization with coronary angiogram N/A 02/22/2013    Procedure: LEFT HEART CATHETERIZATION WITH CORONARY ANGIOGRAM;  Surgeon: Ramond Dial, MD;  Location: [facility] CATH LAB;  Service: Cardiovascular;  Laterality: N/A;  . Left heart catheterization with coronary angiogram N/A 07/25/2013    Procedure: LEFT HEART CATHETERIZATION WITH CORONARY ANGIOGRAM;  Surgeon: Sinclair Grooms, MD;  Location: [facility] CATH LAB;  Service: Cardiovascular;  Laterality: N/A;   Family History  Problem Relation Age of Onset  .  Coronary artery disease Mother   . Emphysema Mother   . Colon cancer Father   . Prostate cancer Father    History  Substance Use Topics  . Smoking status: Current Every Day Smoker -- 1.00 packs/day    Types: Cigarettes  . Smokeless tobacco: Never Used  . Alcohol Use: Yes     Comment: rarely   OB History    No data available     Review of Systems  Ten systems reviewed and are negative for acute change, except as noted in the HPI.    Allergies  Prednisone; Imdur; Lactose intolerance (gi); Morphine and related; Nitroglycerin; and Zoloft  Home Medications   Prior to Admission medications   Medication Sig Start Date End Date Taking? Authorizing Provider  acetaminophen (TYLENOL) 650 MG CR tablet Take 650 mg by mouth every 8 (eight) hours as needed for pain.   Yes Historical Provider, MD  amLODipine (NORVASC) 2.5 MG tablet Take 1 tablet (2.5 mg total) by mouth daily. For hypertension 08/14/13  Yes Minus Breeding, MD  ARIPiprazole (ABILIFY) 5 MG tablet Take 2.5-5 mg by mouth daily.   Yes Historical Provider, MD  aspirin EC 81 MG tablet Take 1 tablet (81 mg total) by mouth every morning. For heart health 03/06/13  Yes Encarnacion Slates, NP  atorvastatin (LIPITOR) 40 MG tablet Take 1 tablet (40 mg total) by mouth daily with supper. For high cholesterol control 08/14/13  Yes Minus Breeding, MD  clonazePAM (KLONOPIN) 0.5 MG tablet Take 0.5 mg by mouth 3 (three) times daily as needed for anxiety.    Yes Historical Provider, MD  clopidogrel (PLAVIX) 75 MG tablet Take 1 tablet (75 mg total) by mouth daily with supper. For heart condition 08/14/13  Yes Minus Breeding, MD  [facility] NICOTINE POLACRILEX 4 MG lozenge TAKE 1 LOZENGE BY MOUTH AS NEEDED FOR SMOKING CESSATION 04/15/14  Yes Elsie Stain, MD  dicyclomine (BENTYL) 10 MG capsule Take 10 mg by mouth every 6 (six) hours as needed (abdominal pain).   Yes Historical Provider, MD  docusate sodium 100 MG CAPS Take 100 mg by mouth 2 (two) times daily as  needed for mild constipation or moderate constipation. 05/12/14  Yes Isaiah Serge, NP  DULoxetine (CYMBALTA) 30 MG capsule Take 30 mg by mouth daily.   Yes Historical Provider, MD  DULoxetine (CYMBALTA) 60 MG capsule Take 60 mg by mouth daily.   Yes Historical Provider, MD  esomeprazole (NEXIUM) 40 MG capsule Take 40 mg by mouth 2 (two) times daily before a meal. For acid reflux 03/06/13  Yes Encarnacion Slates, NP  furosemide (LASIX) 20 MG tablet Take 1 tablet (20 mg total) by mouth daily. 07/07/14  Yes Minus Breeding, MD  levothyroxine (SYNTHROID) 50 MCG tablet Take 50 mcg by mouth daily before breakfast.   Yes Historical Provider, MD  losartan (COZAAR) 50 MG tablet Take 1 tablet (50 mg total) by mouth daily. For hypertension 08/14/13  Yes Minus Breeding, MD  metoprolol tartrate (LOPRESSOR) 25 MG tablet Take 25 mg by mouth daily.  11/08/13  Yes Minus Breeding, MD  nitroGLYCERIN (NITROSTAT) 0.4 MG SL tablet Place 1 tablet (0.4 mg total) under the tongue every 5 (five) minutes as needed for chest pain. 06/21/13  Yes Minus Breeding, MD  ondansetron (ZOFRAN-ODT) 4 MG disintegrating tablet Take 4 mg by mouth every 8 (eight) hours as needed for nausea or vomiting.   Yes Historical Provider, MD  PROAIR HFA 108 (90 BASE) MCG/ACT inhaler USE 2 PUFFS EVERY 4 HOURS AS NEEDED FOR WHEEZING (SHORTNESS OF BREATH) 03/04/14  Yes Elsie Stain, MD  traZODone (DESYREL) 100 MG tablet Take 200 mg by mouth at bedtime.   Yes Historical Provider, MD   BP 134/77 mmHg  Pulse 84  Temp(Src) 98.1 F (36.7 C) (Oral)  Resp 17  SpO2 96% Physical Exam  Constitutional: She is oriented to person, place, and time. She appears well-developed and well-nourished. No distress.  HENT:  Head: Normocephalic and atraumatic.  Eyes: Conjunctivae are normal. No scleral icterus.  Neck: Normal range of motion.  Cardiovascular: Normal rate, regular rhythm and normal heart sounds.  Exam reveals no gallop and no friction rub.   No murmur  heard. Pulmonary/Chest: Effort normal and breath sounds normal. No respiratory distress.    Abdominal: Soft. Bowel sounds are normal. She exhibits no distension and no mass. There is tenderness. There is no guarding.    Neurological: She is alert and oriented to person, place, and time.  Skin: Skin is warm and dry. She is not diaphoretic.  Nursing note and vitals reviewed.   ED Course  Procedures (including critical care time) Labs Review Labs Reviewed  CBC - Abnormal; Notable for the following:    WBC 11.3 (*)    MCV 104.6 (*)    MCH 35.2 (*)    All other components within normal limits  COMPREHENSIVE METABOLIC PANEL - Abnormal; Notable for the following:    Potassium 3.1 (*)    Glucose, Bld 149 (*)    AST 48 (*)    ALT 36 (*)    All other components within normal limits  ACETAMINOPHEN LEVEL - Abnormal; Notable for the following:    Acetaminophen (Tylenol), Serum <10.0 (*)    All other components within normal limits  URINE RAPID DRUG SCREEN (HOSP PERFORMED) - Abnormal; Notable for the following:    Benzodiazepines POSITIVE (*)    All other components within normal limits  I-STAT CHEM 8, ED - Abnormal; Notable for the following:    Glucose, Bld 153 (*)    Hemoglobin 15.6 (*)    All other components within normal limits  ETHANOL  SALICYLATE LEVEL  LIPASE, BLOOD  URINALYSIS, ROUTINE W REFLEX MICROSCOPIC  I-STAT TROPOININ, ED    Imaging Review Dg Chest 2 View  07/24/2014   CLINICAL DATA:  Pneumonia  EXAM: CHEST  2 VIEW  COMPARISON:  05/11/2014  FINDINGS: Low volumes. Linear atelectasis for scar in the left lower lung zone. Normal heart size. Normal vascularity. No pleural effusion or pneumothorax. Stable mid-level thoracic compression deformity.  IMPRESSION: No active cardiopulmonary disease.   Electronically Signed   By: Marybelle Killings M.D.   On: 07/24/2014 08:09     EKG Interpretation   pDate/Time:  Tuesday July 23 2014 14:45:35 EST Ventricular Rate:  100 PR  Interval:  154 QRS Duration: 87 QT Interval:  327 QTC Calculation: 422 R Axis:   -25 Text Interpretation:  Sinus  tachycardia Ventricular premature complex  Aberrant conduction of SV complex(es) Borderline left axis deviation Low  voltage, precordial leads No significant change since last tracing  Confirmed by KNAPP  MD-J, JON ([id number]) on 07/23/2014 3:22:15 PM      MDM   Final diagnoses:  Multiple drug overdose, intentional self-harm, initial encounter  Suicide attempt by drug ingestion, initial encounter  Right upper quadrant pain   11:29 AM BP 134/77 mmHg  Pulse 84  Temp(Src) 98.1 F (36.7 C) (Oral)  Resp 17  SpO2 96% Patient here with suicide attempt, severe depression. Given her history, I feel the patient will certainly need inpatient psychiatric hospitalization after she has been medically cleared. I will consult with the pharmacy technician to see if we can clearly delineate which medication she took, and approximately how many. Vision appears stable at this time. Her urinalysis is negative. She has an elevation in her white count of 11,000 and slightly elevated MCV. I have spoken with [place] control who requests a 6 hour obs and repeat EKG at 4 hours  BP 134/77 mmHg  Pulse 84  Temp(Src) 98.1 F (36.7 C) (Oral)  Resp 17  SpO2 96% Patient course stable through visit. Her EKG is unchanged. SHe appears safe for psych eval and medically cleared.   Margarita Mail, PA-C 07/24/14 1131  Dorie Rank, MD 07/24/14 [phone]

## 2014-07-23 NOTE — ED Notes (Signed)
TTS at bedside. 

## 2014-07-23 NOTE — ED Notes (Signed)
Per PA, patient is to stay in her current room for OD observation at this time. Not to be placed in psych hold.

## 2014-07-24 ENCOUNTER — Emergency Department (HOSPITAL_COMMUNITY): Payer: Medicare Other

## 2014-07-24 DIAGNOSIS — T1491XA Suicide attempt, initial encounter: Secondary | ICD-10-CM | POA: Insufficient documentation

## 2014-07-24 DIAGNOSIS — F411 Generalized anxiety disorder: Secondary | ICD-10-CM

## 2014-07-24 DIAGNOSIS — T50911A Poisoning by multiple unspecified drugs, medicaments and biological substances, accidental (unintentional), initial encounter: Secondary | ICD-10-CM | POA: Insufficient documentation

## 2014-07-24 DIAGNOSIS — R45851 Suicidal ideations: Secondary | ICD-10-CM

## 2014-07-24 DIAGNOSIS — T50902A Poisoning by unspecified drugs, medicaments and biological substances, intentional self-harm, initial encounter: Secondary | ICD-10-CM | POA: Insufficient documentation

## 2014-07-24 LAB — I-STAT CHEM 8, ED
BUN: 7 mg/dL (ref 6–23)
Calcium, Ion: 1.15 mmol/L (ref 1.12–1.23)
Chloride: 99 mmol/L (ref 96–112)
Creatinine, Ser: 0.6 mg/dL (ref 0.50–1.10)
GLUCOSE: 153 mg/dL — AB (ref 70–99)
HCT: 46 % (ref 36.0–46.0)
Hemoglobin: 15.6 g/dL — ABNORMAL HIGH (ref 12.0–15.0)
Potassium: 3.5 mmol/L (ref 3.5–5.1)
Sodium: 141 mmol/L (ref 135–145)
TCO2: 25 mmol/L (ref 0–100)

## 2014-07-24 MED ORDER — ARIPIPRAZOLE 5 MG PO TABS
5.0000 mg | ORAL_TABLET | Freq: Every day | ORAL | Status: DC
  Administered 2014-07-24: 5 mg via ORAL
  Filled 2014-07-24: qty 1

## 2014-07-24 MED ORDER — TRAZODONE HCL 100 MG PO TABS: 200.0000 mg | ORAL_TABLET | Freq: Every day | ORAL | Status: DC

## 2014-07-24 MED ORDER — DULOXETINE HCL 30 MG PO CPEP: 30.0000 mg | ORAL_CAPSULE | Freq: Every day | ORAL | Status: DC

## 2014-07-24 MED ORDER — FUROSEMIDE 40 MG PO TABS
20.0000 mg | ORAL_TABLET | Freq: Every day | ORAL | Status: DC
  Administered 2014-07-24: 20 mg via ORAL
  Filled 2014-07-24: qty 1

## 2014-07-24 MED ORDER — HYDROXYZINE HCL 25 MG PO TABS
25.0000 mg | ORAL_TABLET | Freq: Four times a day (QID) | ORAL | Status: DC | PRN
  Administered 2014-07-24: 25 mg via ORAL
  Filled 2014-07-24: qty 1

## 2014-07-24 MED ORDER — DOCUSATE SODIUM 100 MG PO CAPS: 100.0000 mg | ORAL_CAPSULE | Freq: Two times a day (BID) | ORAL | Status: DC | PRN

## 2014-07-24 MED ORDER — ALBUTEROL SULFATE HFA 108 (90 BASE) MCG/ACT IN AERS
1.0000 | INHALATION_SPRAY | RESPIRATORY_TRACT | Status: DC
  Filled 2014-07-24: qty 6.7

## 2014-07-24 MED ORDER — CLONAZEPAM 0.5 MG PO TABS: 0.5000 mg | ORAL_TABLET | Freq: Three times a day (TID) | ORAL | Status: DC | PRN

## 2014-07-24 MED ORDER — PRAZOSIN HCL 2 MG PO CAPS
2.0000 mg | ORAL_CAPSULE | Freq: Every day | ORAL | Status: DC
  Filled 2014-07-24: qty 1

## 2014-07-24 MED ORDER — ALBUTEROL SULFATE HFA 108 (90 BASE) MCG/ACT IN AERS: 1.0000 | INHALATION_SPRAY | RESPIRATORY_TRACT | Status: DC | PRN

## 2014-07-24 MED ORDER — NITROGLYCERIN 0.4 MG SL SUBL: 0.4000 mg | SUBLINGUAL_TABLET | SUBLINGUAL | Status: DC | PRN

## 2014-07-24 MED ORDER — ONDANSETRON 4 MG PO TBDP: 4.0000 mg | ORAL_TABLET | Freq: Three times a day (TID) | ORAL | Status: DC | PRN

## 2014-07-24 MED ORDER — LOSARTAN POTASSIUM 50 MG PO TABS
50.0000 mg | ORAL_TABLET | Freq: Every day | ORAL | Status: DC
  Administered 2014-07-24: 50 mg via ORAL
  Filled 2014-07-24: qty 1

## 2014-07-24 MED ORDER — ASPIRIN EC 81 MG PO TBEC
81.0000 mg | DELAYED_RELEASE_TABLET | Freq: Every day | ORAL | Status: DC
  Administered 2014-07-24: 81 mg via ORAL
  Filled 2014-07-24: qty 1

## 2014-07-24 MED ORDER — METOPROLOL TARTRATE 25 MG PO TABS
25.0000 mg | ORAL_TABLET | Freq: Every day | ORAL | Status: DC
  Administered 2014-07-24: 25 mg via ORAL
  Filled 2014-07-24: qty 1

## 2014-07-24 MED ORDER — AMLODIPINE BESYLATE 2.5 MG PO TABS
2.5000 mg | ORAL_TABLET | Freq: Every day | ORAL | Status: DC
  Administered 2014-07-24: 2.5 mg via ORAL
  Filled 2014-07-24: qty 1

## 2014-07-24 MED ORDER — SERTRALINE HCL 50 MG PO TABS
50.0000 mg | ORAL_TABLET | Freq: Every day | ORAL | Status: DC
  Administered 2014-07-24: 50 mg via ORAL
  Filled 2014-07-24: qty 1

## 2014-07-24 MED ORDER — ATORVASTATIN CALCIUM 40 MG PO TABS
40.0000 mg | ORAL_TABLET | Freq: Every day | ORAL | Status: DC
  Filled 2014-07-24: qty 1

## 2014-07-24 MED ORDER — DULOXETINE HCL 30 MG PO CPEP
90.0000 mg | ORAL_CAPSULE | Freq: Every day | ORAL | Status: DC
  Filled 2014-07-24: qty 3

## 2014-07-24 MED ORDER — CLOPIDOGREL BISULFATE 75 MG PO TABS
75.0000 mg | ORAL_TABLET | Freq: Every day | ORAL | Status: DC
  Filled 2014-07-24: qty 1

## 2014-07-24 MED ORDER — PANTOPRAZOLE SODIUM 40 MG PO TBEC
40.0000 mg | DELAYED_RELEASE_TABLET | Freq: Every day | ORAL | Status: DC
  Administered 2014-07-24: 40 mg via ORAL
  Filled 2014-07-24: qty 1

## 2014-07-24 MED ORDER — BUSPIRONE HCL 10 MG PO TABS
10.0000 mg | ORAL_TABLET | Freq: Three times a day (TID) | ORAL | Status: DC
Start: 2014-07-24 — End: 2014-07-24
  Administered 2014-07-24: 10 mg via ORAL
  Filled 2014-07-24: qty 1

## 2014-07-24 MED ORDER — DICYCLOMINE HCL 10 MG PO CAPS
10.0000 mg | ORAL_CAPSULE | Freq: Four times a day (QID) | ORAL | Status: DC | PRN
  Filled 2014-07-24: qty 1

## 2014-07-24 MED ORDER — LEVOTHYROXINE SODIUM 50 MCG PO TABS
50.0000 ug | ORAL_TABLET | Freq: Every day | ORAL | Status: DC
  Administered 2014-07-24: 50 ug via ORAL
  Filled 2014-07-24 (×2): qty 1

## 2014-07-24 NOTE — ED Notes (Signed)
Assumed care of patient from Irish Elders, RN.  Patient sitting up in bed watching television.  Patient requested and received several beverages (OJ, coffee, water).  Patient states she is not currently suicidal or homicidal, but feels "safe" here and would not feel safe at home.  Patient states her grandson is dying of cancer and she asked me to look up a Facebook page to see if he is still alive or actively dying.  Patient informed that we cannot access FB from computers on hospital property and I do not have my phone with me and cannot access FB.  Patient states she has had 2 previous suicide attempts and on one occasion considered poisoning herself by running a garden hose from her exhaust into her car.  Patient states she purchased the hose and researched what she "could expect," but ultimately abandoned the plan because she couldn't find a parking lot deserted enough to be undisturbed during the attempt.

## 2014-07-24 NOTE — ED Notes (Signed)
Please see paper chart between [facility] and 0215.
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
## 2014-07-24 NOTE — BH Assessment (Signed)
Patient accepted to [facility], per [place].. The accepting doctor is Brantley Fling, MD. Nursing report [phone]. Patient to present to the registration department in the ER upon arrival.

## 2014-07-24 NOTE — BH Assessment (Addendum)
Nunzio Cory called from [facility] requesting a chest x-ray and EKG. EKG report was faxed to [facility] for review. Spoke with ED and requested chest x-ray order. EDP Dr. Sabra Heck agreeable to order chest x-ray.  Faxed EKG report to [facility] @[phone]   Shaune Pollack, MS, [place] Assessment Counselor

## 2014-07-24 NOTE — ED Notes (Signed)
Pt awoke this morning and ambulated to BR without difficulty. Pt given fluids per request. Pt noted to have taken off [place] during the night, and slept without receiving oxygen.

## 2014-07-24 NOTE — BH Assessment (Signed)
Spoke with Jalene Mullet, Disposition Counselor who will follow up with [facility] regarding information requested.  Shaune Pollack, MS, [place] Assessment Counselor

## 2014-07-24 NOTE — ED Notes (Signed)
Patient became upset when [employer] served her with IVC paperwork.  She stated her rights as a patient are being violated and she doesn't want to go to [facility] because it is not local.  Patient has expressed her desire not to go to [facility] and prefers [facility] [facility] or [facility], but neither of them have accepted her.  Patient encouraged to keep an open mind about [facility] and informed that because she is IVC - she will have be transported there and back by the [facility] Department.  Patient stated she was having an anxiety attack and requested something.  Patient received vistaril per Rx.

## 2014-07-30 ENCOUNTER — Encounter: Payer: Self-pay | Admitting: Cardiology

## 2014-08-16 ENCOUNTER — Other Ambulatory Visit: Payer: Self-pay | Admitting: Cardiology

## 2014-08-17 ENCOUNTER — Other Ambulatory Visit: Payer: Self-pay | Admitting: Cardiology

## 2014-08-19 ENCOUNTER — Telehealth: Payer: Self-pay | Admitting: Critical Care Medicine

## 2014-08-19 ENCOUNTER — Other Ambulatory Visit: Payer: Self-pay | Admitting: Cardiology

## 2014-08-19 NOTE — Telephone Encounter (Signed)
Spoke with pt, c/o little prod cough with white mucus, fever of 99-101.  Has body aches, sore throat, diahrrea. All s/s present since first going to hospital. Pt was hospitalized at [facility] for a suicide attempt on 2/9, released 2/15.  Went back to hospital about 2 weeks ago for these symptoms, given a zpak, tested negative for flu. Did not tolerate zpak well, Went back to hospital, given another abx but does not know the name of it.  These meds have not helped with the symptoms.  Taking mucinex dm, tylenol, inhalers.  [facility] [address]    Dr. Joya Gaskins please advise on recs.  Thanks!

## 2014-08-19 NOTE — Telephone Encounter (Signed)
Can she come to [place] office tomorrow?

## 2014-08-19 NOTE — Telephone Encounter (Signed)
Pt scheduled at 12:00 in [employer].  Nothing further needed.

## 2014-08-20 ENCOUNTER — Ambulatory Visit (INDEPENDENT_AMBULATORY_CARE_PROVIDER_SITE_OTHER): Payer: Medicare Other | Admitting: Critical Care Medicine

## 2014-08-20 ENCOUNTER — Encounter: Payer: Self-pay | Admitting: Critical Care Medicine

## 2014-08-20 VITALS — BP 132/68 | HR 70 | Temp 97.6°F | Ht 61.0 in | Wt 209.4 lb

## 2014-08-20 DIAGNOSIS — R05 Cough: Secondary | ICD-10-CM

## 2014-08-20 DIAGNOSIS — J4541 Moderate persistent asthma with (acute) exacerbation: Secondary | ICD-10-CM

## 2014-08-20 DIAGNOSIS — I2510 Atherosclerotic heart disease of native coronary artery without angina pectoris: Secondary | ICD-10-CM

## 2014-08-20 DIAGNOSIS — R059 Cough, unspecified: Secondary | ICD-10-CM

## 2014-08-20 MED ORDER — BENZONATATE 100 MG PO CAPS: ORAL_CAPSULE | ORAL | Status: DC

## 2014-08-20 MED ORDER — FAMOTIDINE 20 MG PO TABS: 20.0000 mg | ORAL_TABLET | Freq: Two times a day (BID) | ORAL | Status: DC

## 2014-08-20 MED ORDER — DEXTROMETHORPHAN POLISTIREX 30 MG/5ML PO LQCR: ORAL | Status: DC

## 2014-08-20 MED ORDER — LEVOTHYROXINE SODIUM 50 MCG PO TABS: 50.0000 ug | ORAL_TABLET | Freq: Every day | ORAL | Status: DC

## 2014-08-20 MED ORDER — ESOMEPRAZOLE MAGNESIUM 40 MG PO CPDR: DELAYED_RELEASE_CAPSULE | ORAL | Status: DC

## 2014-08-20 MED ORDER — ONDANSETRON 4 MG PO TBDP: 4.0000 mg | ORAL_TABLET | Freq: Three times a day (TID) | ORAL | Status: DC | PRN

## 2014-08-20 NOTE — Patient Instructions (Signed)
USe [facility] cough syrup and benzonatate per cough protocol Stop fishermans friend menthol cough pills Stop nexium for two weeks and use famotidine 20mg  twice daily Refill on zofran and levothyroxine given No antibiotics given Return 6 weeks

## 2014-08-20 NOTE — Progress Notes (Signed)
Subjective:    Patient ID: Kara Lamb, female    DOB: 05/22/1956, 59 y.o.   MRN: [id number]  HPI 08/20/2014 Chief Complaint  Patient presents with  . Acute Visit    c/o 12 days ago N/V,diarrhea,sorethroat,decreased appetite,sob worse,wheezing,cough-sm. amt.yellow brown,no blood,had fcs (was 102.8). Still has diarrhea now.PND.   Pt was in hosp for psych reasons, [facility] 07/29/14 suicidal. Rx anxiolytics and zoloft .  Pt got diarrhea on zoloft.  Only filled temazapam.     12 days ago, gagging , emesis, nausea x one night, then severe sore throat and cough and fever.  No tamiflu given.  08/14/14 flu test was neg and CXR.  Rx zpak, not able to take d/t diarrhea.  Now diarrhea is still present.  No appt.  Fever is better, eats oatmeal once.  Pt has lost 8#.  Pt with severe cough, cough is prod yellow brown.  Pt notes some pndrip. Sleeps in recliner Pt gets up gagging.  Notes mild rhinorrea.  Appt poor, sore throat is better   Review of Systems Constitutional:   + weight loss, night sweats, +++ Fevers, +++chills, + fatigue, or +++ lassitude.  HEENT:   No headaches,  Difficulty swallowing,  Tooth/dental problems, or  +++Sore throat,                No sneezing, itching, ear ache, nasal congestion, ++post nasal drip,   CV:  No chest pain,  Orthopnea, PND, swelling in lower extremities, anasarca, dizziness, palpitations, syncope.   GI  No heartburn, indigestion, abdominal pain, nausea, vomiting, diarrhea, change in bowel habits, loss of appetite, bloody stools.   Resp:    No coughing up of blood.    No chest wall deformity  Skin: no rash or lesions.  GU: no dysuria, change in color of urine, no urgency or frequency.  No flank pain, no hematuria   MS:  No joint pain or swelling.  No decreased range of motion.  No back pain.  Psych:  No change in mood or affect. No depression or anxiety.  No memory loss.     Objective:   Physical ExamBP 132/68 mmHg  Pulse 70  Temp(Src) 97.6 F  (36.4 C) (Oral)  Ht 5\' 1"  (1.549 m)  Wt 209 lb 6.4 oz (94.983 kg)  BMI 39.59 kg/m2  SpO2 95%  GEN: A/Ox3; pleasant , NAD, well nourished   HEENT:  [place]/AT,  EACs-clear, TMs-wnl, NOSE-clear, THROAT-clear, no lesions, no postnasal drip or exudate noted.   NECK:  Supple w/ fair ROM; no JVD; normal carotid impulses w/o bruits; no thyromegaly or nodules palpated; no lymphadenopathy.  RESP  Coarse BS  w/o, wheezes/ rales/ or rhonchi.no accessory muscle use, no dullness to percussion, prominent upper airway pseudo-wheeze  CARD:  RRR, no m/r/g  , no peripheral edema, pulses intact, no cyanosis or clubbing.  GI:   Soft & nt; nml bowel sounds; no organomegaly or masses detected.  Musco: Warm bil, no deformities or joint swelling noted.   Neuro: alert, no focal deficits noted.    Skin: Warm, no lesions or rashes     Assessment & Plan:   Asthma, mild intermittent Mild intermittent asthma Associated cyclic cough secondary to reflux disease and postnasal drip syndrome status post viral illness No evidence of active infection at this time Cough being promoted by chronic use of menthol cough drops Diarrheal syndrome being exacerbated by proton pump inhibitor With history of severe behavioral health issues including suicidal ideation need to avoid  systemic steroids Plan Further antibiotics not indicated USe [facility]S cough syrup and benzonatate per cough protocol Stop fishermans friend menthol cough pills Stop nexium for two weeks and use famotidine 20mg  twice daily Refill on zofran and levothyroxine given No antibiotics given Return 6 weeks      Updated Medication List Outpatient Encounter Prescriptions as of 08/20/2014  Medication Sig  . acetaminophen (TYLENOL) 160 MG/5ML suspension Take 320 mg by mouth every 6 (six) hours as needed.  Marland Kitchen amLODipine (NORVASC) 2.5 MG tablet TAKE 1 TABLET BY MOUTH EVERY DAY FOR HYPERTENSION  . ARIPiprazole (ABILIFY) 5 MG tablet Take 5 mg by mouth daily.   Marland Kitchen  aspirin EC 81 MG tablet Take 1 tablet (81 mg total) by mouth every morning. For heart health  . atorvastatin (LIPITOR) 40 MG tablet TAKE 1 TABLET (40 MG TOTAL) BY MOUTH DAILY WITH SUPPER. FOR HIGH CHOLESTEROL CONTROL  . clonazePAM (KLONOPIN) 0.5 MG tablet Take 0.5 mg by mouth 3 (three) times daily as needed for anxiety.  . clopidogrel (PLAVIX) 75 MG tablet TAKE 1 TABLET BY MOUTH EVERY DAY WITH SUPPER FOR HEAR CONDITION  . CVS NICOTINE POLACRILEX 4 MG lozenge TAKE 1 LOZENGE BY MOUTH AS NEEDED FOR SMOKING CESSATION  . dextromethorphan 15 MG/5ML syrup Take 10 mLs by mouth 4 (four) times daily as needed for cough.  . dicyclomine (BENTYL) 10 MG capsule Take 10 mg by mouth every 6 (six) hours as needed (abdominal pain).  Marland Kitchen esomeprazole (NEXIUM) 40 MG capsule HOLD for TWO WEEKS then RESUMR    For acid reflux. 1 tablet daily.  Marland Kitchen levothyroxine (SYNTHROID) 50 MCG tablet Take 1 tablet (50 mcg total) by mouth daily before breakfast.  . losartan (COZAAR) 50 MG tablet TAKE 1 TABLET BY MOUTH EVERY DAY FOR HYPERTENSION  . metoprolol tartrate (LOPRESSOR) 25 MG tablet Take 25 mg by mouth daily.   . nitroGLYCERIN (NITROSTAT) 0.4 MG SL tablet Place 1 tablet (0.4 mg total) under the tongue every 5 (five) minutes as needed for chest pain.  Marland Kitchen ondansetron (ZOFRAN-ODT) 4 MG disintegrating tablet Take 1 tablet (4 mg total) by mouth every 8 (eight) hours as needed for nausea or vomiting.  Marland Kitchen PROAIR HFA 108 (90 BASE) MCG/ACT inhaler USE 2 PUFFS EVERY 4 HOURS AS NEEDED FOR WHEEZING (SHORTNESS OF BREATH)  . temazepam (RESTORIL) 15 MG capsule Take 1 capsule at bedtime.  . traZODone (DESYREL) 100 MG tablet Take 200 mg by mouth at bedtime.  . [DISCONTINUED] esomeprazole (NEXIUM) 40 MG capsule Take 40 mg by mouth. For acid reflux. 1 tablet daily.  . [DISCONTINUED] levothyroxine (SYNTHROID) 50 MCG tablet Take 50 mcg by mouth daily before breakfast.  . [DISCONTINUED] ondansetron (ZOFRAN-ODT) 4 MG disintegrating tablet Take 4 mg by  mouth every 8 (eight) hours as needed for nausea or vomiting.  . benzonatate (TESSALON) 100 MG capsule Take 1-2 every 4-6 hours as needed for cough  . dextromethorphan (DELSYM) 30 MG/5ML liquid 5ML  Every 4-6 hours per cough protocol Ok to use [facility]S brand  . famotidine (PEPCID) 20 MG tablet Take 1 tablet (20 mg total) by mouth 2 (two) times daily.  . furosemide (LASIX) 20 MG tablet Take 1 tablet (20 mg total) by mouth daily. (Patient not taking: Reported on 08/20/2014)  . [DISCONTINUED] docusate sodium 100 MG CAPS Take 100 mg by mouth 2 (two) times daily as needed for mild constipation or moderate constipation. (Patient not taking: Reported on 08/20/2014)  . [DISCONTINUED] DULoxetine (CYMBALTA) 30 MG capsule Take 30 mg by  mouth daily.

## 2014-08-21 NOTE — Assessment & Plan Note (Signed)
Mild intermittent asthma Associated cyclic cough secondary to reflux disease and postnasal drip syndrome status post viral illness No evidence of active infection at this time Cough being promoted by chronic use of menthol cough drops Diarrheal syndrome being exacerbated by proton pump inhibitor With history of severe behavioral health issues including suicidal ideation need to avoid systemic steroids Plan Further antibiotics not indicated USe CVS cough syrup and benzonatate per cough protocol Stop fishermans friend menthol cough pills Stop nexium for two weeks and use famotidine 20mg  twice daily Refill on zofran and levothyroxine given No antibiotics given Return 6 weeks

## 2014-09-17 ENCOUNTER — Emergency Department (HOSPITAL_COMMUNITY): Payer: Medicare Other

## 2014-09-17 ENCOUNTER — Encounter (HOSPITAL_COMMUNITY): Payer: Self-pay | Admitting: Emergency Medicine

## 2014-09-17 ENCOUNTER — Observation Stay (HOSPITAL_COMMUNITY)
Admission: EM | Admit: 2014-09-17 | Discharge: 2014-09-18 | Disposition: A | Discharge status: Home / Self Care | Payer: Medicare Other | Attending: Cardiology | Admitting: Cardiology

## 2014-09-17 DIAGNOSIS — I252 Old myocardial infarction: Secondary | ICD-10-CM | POA: Insufficient documentation

## 2014-09-17 DIAGNOSIS — F411 Generalized anxiety disorder: Secondary | ICD-10-CM | POA: Diagnosis present

## 2014-09-17 DIAGNOSIS — R072 Precordial pain: Secondary | ICD-10-CM | POA: Diagnosis not present

## 2014-09-17 DIAGNOSIS — E785 Hyperlipidemia, unspecified: Secondary | ICD-10-CM | POA: Diagnosis not present

## 2014-09-17 DIAGNOSIS — F17200 Nicotine dependence, unspecified, uncomplicated: Secondary | ICD-10-CM | POA: Diagnosis present

## 2014-09-17 DIAGNOSIS — J449 Chronic obstructive pulmonary disease, unspecified: Secondary | ICD-10-CM | POA: Diagnosis present

## 2014-09-17 DIAGNOSIS — J45901 Unspecified asthma with (acute) exacerbation: Secondary | ICD-10-CM | POA: Diagnosis not present

## 2014-09-17 DIAGNOSIS — R079 Chest pain, unspecified: Secondary | ICD-10-CM | POA: Diagnosis not present

## 2014-09-17 DIAGNOSIS — I2510 Atherosclerotic heart disease of native coronary artery without angina pectoris: Secondary | ICD-10-CM | POA: Diagnosis not present

## 2014-09-17 DIAGNOSIS — F329 Major depressive disorder, single episode, unspecified: Secondary | ICD-10-CM | POA: Diagnosis not present

## 2014-09-17 DIAGNOSIS — F322 Major depressive disorder, single episode, severe without psychotic features: Secondary | ICD-10-CM | POA: Diagnosis present

## 2014-09-17 DIAGNOSIS — I10 Essential (primary) hypertension: Secondary | ICD-10-CM | POA: Diagnosis present

## 2014-09-17 DIAGNOSIS — Z720 Tobacco use: Secondary | ICD-10-CM | POA: Insufficient documentation

## 2014-09-17 DIAGNOSIS — Z9861 Coronary angioplasty status: Secondary | ICD-10-CM | POA: Diagnosis not present

## 2014-09-17 DIAGNOSIS — R110 Nausea: Secondary | ICD-10-CM | POA: Diagnosis not present

## 2014-09-17 DIAGNOSIS — E669 Obesity, unspecified: Secondary | ICD-10-CM | POA: Diagnosis not present

## 2014-09-17 DIAGNOSIS — K219 Gastro-esophageal reflux disease without esophagitis: Secondary | ICD-10-CM | POA: Diagnosis present

## 2014-09-17 DIAGNOSIS — Z7902 Long term (current) use of antithrombotics/antiplatelets: Secondary | ICD-10-CM | POA: Diagnosis not present

## 2014-09-17 DIAGNOSIS — Z9889 Other specified postprocedural states: Secondary | ICD-10-CM | POA: Insufficient documentation

## 2014-09-17 DIAGNOSIS — J4489 Other specified chronic obstructive pulmonary disease: Secondary | ICD-10-CM | POA: Diagnosis present

## 2014-09-17 DIAGNOSIS — F319 Bipolar disorder, unspecified: Secondary | ICD-10-CM | POA: Diagnosis present

## 2014-09-17 DIAGNOSIS — G8929 Other chronic pain: Secondary | ICD-10-CM | POA: Diagnosis present

## 2014-09-17 DIAGNOSIS — M1990 Unspecified osteoarthritis, unspecified site: Secondary | ICD-10-CM | POA: Insufficient documentation

## 2014-09-17 DIAGNOSIS — Z7982 Long term (current) use of aspirin: Secondary | ICD-10-CM | POA: Diagnosis not present

## 2014-09-17 DIAGNOSIS — R0789 Other chest pain: Secondary | ICD-10-CM | POA: Insufficient documentation

## 2014-09-17 DIAGNOSIS — F418 Other specified anxiety disorders: Secondary | ICD-10-CM

## 2014-09-17 DIAGNOSIS — F419 Anxiety disorder, unspecified: Secondary | ICD-10-CM | POA: Diagnosis not present

## 2014-09-17 DIAGNOSIS — Z79899 Other long term (current) drug therapy: Secondary | ICD-10-CM | POA: Insufficient documentation

## 2014-09-17 LAB — BASIC METABOLIC PANEL
Anion gap: 11 (ref 5–15)
BUN: 6 mg/dL (ref 6–23)
CHLORIDE: 106 mmol/L (ref 96–112)
CO2: 23 mmol/L (ref 19–32)
CREATININE: 0.65 mg/dL (ref 0.50–1.10)
Calcium: 8.8 mg/dL (ref 8.4–10.5)
GFR calc non Af Amer: >90 mL/min (ref >90)
GLUCOSE: 85 mg/dL (ref 70–99)
Potassium: 3.4 mmol/L — ABNORMAL LOW (ref 3.5–5.1)
Sodium: 140 mmol/L (ref 135–145)

## 2014-09-17 LAB — CBC WITH DIFFERENTIAL/PLATELET
Basophils Absolute: 0.1 10*3/uL (ref 0.0–0.1)
Basophils Relative: 1 % (ref 0–1)
Eosinophils Absolute: 0.2 10*3/uL (ref 0.0–0.7)
Eosinophils Relative: 2 % (ref 0–5)
HCT: 38.4 % (ref 36.0–46.0)
HEMOGLOBIN: 12.9 g/dL (ref 12.0–15.0)
LYMPHS ABS: 3.4 10*3/uL (ref 0.7–4.0)
Lymphocytes Relative: 33 % (ref 12–46)
MCH: 34.4 pg — ABNORMAL HIGH (ref 26.0–34.0)
MCHC: 33.6 g/dL (ref 30.0–36.0)
MCV: 102.4 fL — ABNORMAL HIGH (ref 78.0–100.0)
MONOS PCT: 5 % (ref 3–12)
Monocytes Absolute: 0.5 10*3/uL (ref 0.1–1.0)
NEUTROS ABS: 6 10*3/uL (ref 1.7–7.7)
NEUTROS PCT: 59 % (ref 43–77)
Platelets: 207 10*3/uL (ref 150–400)
RBC: 3.75 MIL/uL — ABNORMAL LOW (ref 3.87–5.11)
RDW: 12.5 % (ref 11.5–15.5)
WBC: 10.2 10*3/uL (ref 4.0–10.5)

## 2014-09-17 LAB — I-STAT TROPONIN, ED: Troponin i, poc: 0 ng/mL (ref 0.00–0.08)

## 2014-09-17 MED ORDER — FENTANYL CITRATE 0.05 MG/ML IJ SOLN
50.0000 ug | Freq: Once | INTRAMUSCULAR | Status: AC
  Administered 2014-09-17: 50 ug via INTRAVENOUS
  Filled 2014-09-17: qty 2

## 2014-09-17 MED ORDER — ONDANSETRON HCL 4 MG/2ML IJ SOLN
4.0000 mg | Freq: Once | INTRAMUSCULAR | Status: AC
  Administered 2014-09-17: 4 mg via INTRAVENOUS
  Filled 2014-09-17: qty 2

## 2014-09-17 MED ORDER — CLONAZEPAM 0.5 MG PO TABS
0.5000 mg | ORAL_TABLET | Freq: Three times a day (TID) | ORAL | Status: DC | PRN
  Administered 2014-09-18 (×2): 0.5 mg via ORAL
  Filled 2014-09-17 (×2): qty 1

## 2014-09-17 MED ORDER — FENTANYL CITRATE 0.05 MG/ML IJ SOLN
50.0000 ug | INTRAMUSCULAR | Status: DC | PRN
  Administered 2014-09-17 (×2): 50 ug via INTRAVENOUS
  Filled 2014-09-17 (×2): qty 2

## 2014-09-17 NOTE — ED Notes (Signed)
Tatyana, PA at the bedside  

## 2014-09-17 NOTE — ED Notes (Signed)
Received 1 nitro in route, took 3 of her own at home. Also took 325 aspirin. 98% on RA

## 2014-09-17 NOTE — ED Provider Notes (Signed)
CSN: [id number]     Arrival date & time 09/17/14  1732 History   First MD Initiated Contact with Patient 09/17/14 1736     Chief Complaint  Patient presents with  . Chest Pain     (Consider location/radiation/quality/duration/timing/severity/associated sxs/prior Treatment) HPI Kara Lamb is a 59 y.o. female with history of hypertension, CAD with prior stenting, GERD, asthma, presents to ED with complaint of chest pain. Pt states pain started this afternoon around 4:30 after her car battery died and she had to jump start it. States she thought it would improve so she took 3 SL nitros that she had, and tried to rest at home but had no improvement so she called 911. Pt had another nitro by EMS which did not help either. Pt states pain iin the center of the chest, feels tight, radiates to left shoulder. Reports associated nausea. No vomiting. Reports headache after SL nitro. States she has not seen cardiologist in over a year, because she was embarrased by her most recent hospitalizations for SI and overdose. Pt states she is feeling better from that stand point now and denies any SI at this time. Pt states pain in her chest is worsening.   Past Medical History  Diagnosis Date  . CAD (coronary artery disease)     a. s/p BMS to RCA and LCX 2008; b. ISR of RCA rx'd with Xience DES 12/09; c. Requires extensive sedation for cath; d. cath 11/10: nonobs; e.Cath 2/12: nonobs; f. 05/2011 Cath: nonobs; g. 02/2013 nonobs, 07/2013 nonobstructive, EF 60-65%.  . Chronic chest pain   . Anxiety and depression   . H/O: suicide attempt     drug overdose 3/08  . Hypertension   . Obesity   . Tobacco abuse     a. quit in 2012  . H/O ETOH abuse   . Arthritis   . Hyperlipidemia   . Colitis   . GERD (gastroesophageal reflux disease)   . Complication of anesthesia   . Asthma    Past Surgical History  Procedure Laterality Date  . Ptca      stent  . Colon biopsy  08/12/2008  . Tubal ligation  1982  . Cesarean  section      x 3  . Carpal tunnel release    . Left heart catheterization with coronary angiogram  06/04/2011    Procedure: LEFT HEART CATHETERIZATION WITH CORONARY ANGIOGRAM;  Surgeon: Burnell Blanks, MD;  Location: [facility] CATH LAB;  Service: Cardiovascular;;  . Left heart catheterization with coronary angiogram N/A 02/22/2013    Procedure: LEFT HEART CATHETERIZATION WITH CORONARY ANGIOGRAM;  Surgeon: Ramond Dial, MD;  Location: [facility] CATH LAB;  Service: Cardiovascular;  Laterality: N/A;  . Left heart catheterization with coronary angiogram N/A 07/25/2013    Procedure: LEFT HEART CATHETERIZATION WITH CORONARY ANGIOGRAM;  Surgeon: Sinclair Grooms, MD;  Location: [facility] CATH LAB;  Service: Cardiovascular;  Laterality: N/A;   Family History  Problem Relation Age of Onset  . Coronary artery disease Mother   . Emphysema Mother   . Colon cancer Father   . Prostate cancer Father    History  Substance Use Topics  . Smoking status: Current Every Day Smoker -- 0.50 packs/day    Types: Cigarettes  . Smokeless tobacco: Never Used  . Alcohol Use: Yes     Comment: rarely   OB History    No data available     Review of Systems  Constitutional: Negative for fever  and chills.  Respiratory: Positive for chest tightness and shortness of breath. Negative for cough.   Cardiovascular: Positive for chest pain. Negative for palpitations and leg swelling.  Gastrointestinal: Positive for nausea. Negative for vomiting, abdominal pain and diarrhea.  Genitourinary: Negative for dysuria, flank pain and pelvic pain.  Musculoskeletal: Negative for myalgias, arthralgias, neck pain and neck stiffness.  Skin: Negative for rash.  Neurological: Negative for dizziness, weakness and headaches.  All other systems reviewed and are negative.     Allergies  Prednisone; Imdur; Lactose intolerance (gi); Morphine and related; Nitroglycerin; and Zoloft  Home Medications   Prior to Admission medications    Medication Sig Start Date End Date Taking? Authorizing Provider  acetaminophen (TYLENOL) 160 MG/5ML suspension Take 320 mg by mouth every 6 (six) hours as needed.    Historical Provider, MD  amLODipine (NORVASC) 2.5 MG tablet TAKE 1 TABLET BY MOUTH EVERY DAY FOR HYPERTENSION 08/16/14   Minus Breeding, MD  ARIPiprazole (ABILIFY) 5 MG tablet Take 5 mg by mouth daily.     Historical Provider, MD  aspirin EC 81 MG tablet Take 1 tablet (81 mg total) by mouth every morning. For heart health 03/06/13   Encarnacion Slates, NP  atorvastatin (LIPITOR) 40 MG tablet TAKE 1 TABLET (40 MG TOTAL) BY MOUTH DAILY WITH SUPPER. FOR HIGH CHOLESTEROL CONTROL 08/20/14   Minus Breeding, MD  benzonatate (TESSALON) 100 MG capsule Take 1-2 every 4-6 hours as needed for cough 08/20/14   Elsie Stain, MD  clonazePAM (KLONOPIN) 0.5 MG tablet Take 0.5 mg by mouth 3 (three) times daily as needed for anxiety.    Historical Provider, MD  clopidogrel (PLAVIX) 75 MG tablet TAKE 1 TABLET BY MOUTH EVERY DAY WITH SUPPER FOR HEAR CONDITION 08/19/14   Minus Breeding, MD  [employer] NICOTINE POLACRILEX 4 MG lozenge TAKE 1 LOZENGE BY MOUTH AS NEEDED FOR SMOKING CESSATION 04/15/14   Elsie Stain, MD  dextromethorphan [facility]) 30 MG/5ML liquid 5ML  Every 4-6 hours per cough protocol Ok to use [employer] brand 08/20/14   Elsie Stain, MD  dextromethorphan 15 MG/5ML syrup Take 10 mLs by mouth 4 (four) times daily as needed for cough.    Historical Provider, MD  dicyclomine (BENTYL) 10 MG capsule Take 10 mg by mouth every 6 (six) hours as needed (abdominal pain).    Historical Provider, MD  esomeprazole ([place]) 40 MG capsule HOLD for TWO WEEKS then RESUMR    For acid reflux. 1 tablet daily. 08/20/14   Elsie Stain, MD  famotidine (PEPCID) 20 MG tablet Take 1 tablet (20 mg total) by mouth 2 (two) times daily. 08/20/14   Elsie Stain, MD  furosemide (LASIX) 20 MG tablet Take 1 tablet (20 mg total) by mouth daily. Patient not taking: Reported on 08/20/2014  07/07/14   Minus Breeding, MD  levothyroxine (SYNTHROID) 50 MCG tablet Take 1 tablet (50 mcg total) by mouth daily before breakfast. 08/20/14   Elsie Stain, MD  losartan (COZAAR) 50 MG tablet TAKE 1 TABLET BY MOUTH EVERY DAY FOR HYPERTENSION 08/19/14   Minus Breeding, MD  metoprolol tartrate (LOPRESSOR) 25 MG tablet Take 25 mg by mouth daily.  11/08/13   Minus Breeding, MD  nitroGLYCERIN (NITROSTAT) 0.4 MG SL tablet Place 1 tablet (0.4 mg total) under the tongue every 5 (five) minutes as needed for chest pain. 06/21/13   Minus Breeding, MD  ondansetron (ZOFRAN-ODT) 4 MG disintegrating tablet Take 1 tablet (4 mg total) by mouth every 8 (  eight) hours as needed for nausea or vomiting. 08/20/14   Elsie Stain, MD  PROAIR HFA 108 (90 BASE) MCG/ACT inhaler USE 2 PUFFS EVERY 4 HOURS AS NEEDED FOR WHEEZING (SHORTNESS OF BREATH) 03/04/14   Elsie Stain, MD  sertraline (ZOLOFT) 50 MG tablet Take 50 mg by mouth 3 (three) times daily. 07/31/14   Historical Provider, MD  temazepam (RESTORIL) 15 MG capsule Take 1 capsule at bedtime. 07/31/14   Historical Provider, MD  traZODone (DESYREL) 100 MG tablet Take 200 mg by mouth at bedtime.    Historical Provider, MD   BP 102/49 mmHg  Pulse 70  Temp(Src) 98.1 F (36.7 C) (Oral)  Resp 15  SpO2 100% Physical Exam  Constitutional: She is oriented to person, place, and time. She appears well-developed and well-nourished. No distress.  HENT:  Head: Normocephalic.  Eyes: Conjunctivae are normal.  Neck: Neck supple.  Cardiovascular: Normal rate, regular rhythm and normal heart sounds.   Pulmonary/Chest: Effort normal and breath sounds normal. No respiratory distress. She has no wheezes. She has no rales.  Abdominal: Soft. Bowel sounds are normal. She exhibits no distension. There is no tenderness. There is no rebound.  Musculoskeletal: She exhibits no edema.  Non pitting edema bilaterally  Neurological: She is alert and oriented to person, place, and time.  Skin:  Skin is warm and dry.  Psychiatric: She has a normal mood and affect. Her behavior is normal.  Nursing note and vitals reviewed.   ED Course  Procedures (including critical care time) Labs Review Labs Reviewed  CBC WITH DIFFERENTIAL/PLATELET  BASIC METABOLIC PANEL  I-STAT [place], ED    Imaging Review No results found.   EKG Interpretation   Date/Time:  Tuesday September 17 2014 17:48:22 EDT Ventricular Rate:  71 PR Interval:  167 QRS Duration: 90 QT Interval:  399 QTC Calculation: 434 R Axis:   -14 Text Interpretation:  Sinus rhythm Normal ECG Confirmed by POLLINA  MD,  CHRISTOPHER [phone]) on 09/17/2014 6:01:58 PM      MDM   Final diagnoses:  Chest pain, unspecified chest pain type    Patient with history of coronary disease and MI, with stenting in 2008. Here with the onset of chest pain and start his afternoon after she had to jump start her car. She has not seen cardiologist and a year. She states pain radiating to the left shoulder, has associated nausea, shortness of breath. Patient received 325 mg of aspirin by EMS, she had total of 4 sublingual nitroglycerin on her which helped. We'll try fentanyl, by patient allergic to morphine. Will get labs.  7:10 PM Pt still having pain. Will try another dose of fentanyl  7:19 PM Spoke with cardiology, will come see  Cardiology will admit.   Filed Vitals:   09/17/14 2230 09/17/14 2245 09/17/14 2315 09/17/14 2354  BP: 118/55 123/74 109/55 108/63  Pulse: 88 80 79 79  Temp:    98 F (36.7 C)  TempSrc:    Oral  Resp: 17 14 17 18   Height:    5\' 1"  (1.549 m)  Weight:    209 lb 14.4 oz (95.21 kg)  SpO2: 99% 99% 100% 99%     Jeannett Senior, PA-C 09/18/14 0058  Orpah Greek, MD 09/20/14 1219

## 2014-09-17 NOTE — ED Notes (Signed)
Paged MD about patient receiving Nicotine Patch.

## 2014-09-17 NOTE — ED Notes (Signed)
MD Cardiology at the bedside,

## 2014-09-17 NOTE — ED Notes (Signed)
MD Cardiology at the bedside 

## 2014-09-17 NOTE — ED Notes (Signed)
Pt began having centralized chest pain at 1630, radiates to left shoulder. Feeling nauseated. Hx of MI in 2008, 3 stents placed. Has appt with her cardiologist Friday but states she has no means of getting there. Alert and oriented x4. Vss.

## 2014-09-17 NOTE — H&P (Signed)
Patient ID: Kara Lamb MRN: [id number], DOB/AGE: 59-03-1956   Admit date: 09/17/2014   Primary Physician: No primary care provider on file. Primary Cardiologist: J. Hochrein, MD   Pt. Profile:  59 y/o female with a h/o CAD s/p prior PCI to the RCA and LCX with multiple subsequent caths (last 07/2013) all revealing nonobstructive dzs, who presents to the ED today with chest pain.  Problem List  Past Medical History  Diagnosis Date  . CAD (coronary artery disease)     a. s/p BMS to RCA and LCX 2008; b. ISR of RCA rx'd with Xience DES 12/09; c. Requires extensive sedation for cath; d. cath 11/10: nonobs; e.Cath 2/12: nonobs; f. 05/2011 Cath: nonobs; g. 02/2013 nonobs; h. 07/2013 lat isch on nuc-->cath: nonobstructive, EF 60-65%.  . Chronic chest pain   . Anxiety and depression   . H/O: suicide attempt     a. multiple  . Hypertension   . Obesity   . Tobacco abuse   . H/O ETOH abuse   . Arthritis   . Hyperlipidemia   . Colitis   . GERD (gastroesophageal reflux disease)   . Complication of anesthesia   . Asthma     Past Surgical History  Procedure Laterality Date  . Ptca      stent  . Colon biopsy  08/12/2008  . Tubal ligation  1982  . Cesarean section      x 3  . Carpal tunnel release    . Left heart catheterization with coronary angiogram  06/04/2011    Procedure: LEFT HEART CATHETERIZATION WITH CORONARY ANGIOGRAM;  Surgeon: Burnell Blanks, MD;  Location: [facility] CATH LAB;  Service: Cardiovascular;;  . Left heart catheterization with coronary angiogram N/A 02/22/2013    Procedure: LEFT HEART CATHETERIZATION WITH CORONARY ANGIOGRAM;  Surgeon: Ramond Dial, MD;  Location: [facility] CATH LAB;  Service: Cardiovascular;  Laterality: N/A;  . Left heart catheterization with coronary angiogram N/A 07/25/2013    Procedure: LEFT HEART CATHETERIZATION WITH CORONARY ANGIOGRAM;  Surgeon: Sinclair Grooms, MD;  Location: [facility] CATH LAB;  Service: Cardiovascular;  Laterality: N/A;      Allergies  Allergies  Allergen Reactions  . Prednisone     psychosis  . Heparin Other (See Comments)    Injections in the stomach causes huge knots, tenderness, and pain for weeks.  . Imdur [Isosorbide]     Low blood pressure  . Morphine And Related Other (See Comments)    Patient states she gets morphine for severe chest pains and is okay when she takes it.  . Nitroglycerin Nausea Only    Patches--severe headache Drip--nausea and vomiting   . Zoloft [Sertraline Hcl]     Severe diarrhea    HPI  59 y/o female with the above problem list.  She has a h/o CAD s/p RCA and LCX stenting in 2008 with repeat stenting of the RCA in 2009 2/2 ISR.  Since then, she has had some degree of chronic chest pain that usually occurs with activity but then can last days at a time.  She has had multiple caths as outlined above.  In 07/2013, she had an abnl cardiolite with lateral ischemia, which led to cath revealing nonobs dzs.  She has been medically managed since.  She has also continued to have intermittent chest pain, which seems to be more likely to occur during times of stress and/or depression, which she has a fair amt of.  She has had multiple  suicide attempts, the last of which was in 07/2014.  Over the past ten days, she has been having intermittent chest pain, often beginning with activity and lasting several hours to days.  Today, she was running some errands with a neighbor and her car battery died in the [place] parking lot.  She became anxious and began to have recurrent chest pain w/o associated Ss, beginning around 12:30 PM.  After getting someone to help jump start her car, she drove home and took 3 sl NTG w/o relief. She called EMS and was taken to the [facility] ED.  Here, she has continued to have chest discomfort that is not worse with deep breathing, palpation, or position changes.  ECG is non-acute.  Troponin is nl so far.  Home Medications  Prior to Admission medications   Medication Sig  Start Date End Date Taking? Authorizing Provider  acetaminophen (TYLENOL) 160 MG/5ML suspension Take 320 mg by mouth every 6 (six) hours as needed.    Historical Provider, MD  amLODipine (NORVASC) 2.5 MG tablet TAKE 1 TABLET BY MOUTH EVERY DAY FOR HYPERTENSION 08/16/14   Minus Breeding, MD  ARIPiprazole (ABILIFY) 5 MG tablet Take 5 mg by mouth daily.     Historical Provider, MD  aspirin EC 81 MG tablet Take 1 tablet (81 mg total) by mouth every morning. For heart health 03/06/13   Encarnacion Slates, NP  atorvastatin (LIPITOR) 40 MG tablet TAKE 1 TABLET (40 MG TOTAL) BY MOUTH DAILY WITH SUPPER. FOR HIGH CHOLESTEROL CONTROL 08/20/14   Minus Breeding, MD  benzonatate (TESSALON) 100 MG capsule Take 1-2 every 4-6 hours as needed for cough 08/20/14   Elsie Stain, MD  clonazePAM (KLONOPIN) 0.5 MG tablet Take 0.5 mg by mouth 3 (three) times daily as needed for anxiety.    Historical Provider, MD  clopidogrel (PLAVIX) 75 MG tablet TAKE 1 TABLET BY MOUTH EVERY DAY WITH SUPPER FOR HEAR CONDITION 08/19/14   Minus Breeding, MD  [employer] NICOTINE POLACRILEX 4 MG lozenge TAKE 1 LOZENGE BY MOUTH AS NEEDED FOR SMOKING CESSATION 04/15/14   Elsie Stain, MD  dextromethorphan [facility]) 30 MG/5ML liquid 5ML  Every 4-6 hours per cough protocol Ok to use [employer] brand 08/20/14   Elsie Stain, MD  dextromethorphan 15 MG/5ML syrup Take 10 mLs by mouth 4 (four) times daily as needed for cough.    Historical Provider, MD  dicyclomine (BENTYL) 10 MG capsule Take 10 mg by mouth every 6 (six) hours as needed (abdominal pain).    Historical Provider, MD  esomeprazole ([place]) 40 MG capsule HOLD for TWO WEEKS then RESUMR    For acid reflux. 1 tablet daily. 08/20/14   Elsie Stain, MD  famotidine (PEPCID) 20 MG tablet Take 1 tablet (20 mg total) by mouth 2 (two) times daily. 08/20/14   Elsie Stain, MD  furosemide (LASIX) 20 MG tablet Take 1 tablet (20 mg total) by mouth daily. Patient not taking: Reported on 08/20/2014 07/07/14   Minus Breeding, MD  levothyroxine (SYNTHROID) 50 MCG tablet Take 1 tablet (50 mcg total) by mouth daily before breakfast. 08/20/14   Elsie Stain, MD  losartan (COZAAR) 50 MG tablet TAKE 1 TABLET BY MOUTH EVERY DAY FOR HYPERTENSION 08/19/14   Minus Breeding, MD  metoprolol tartrate (LOPRESSOR) 25 MG tablet Take 25 mg by mouth daily.  11/08/13   Minus Breeding, MD  nitroGLYCERIN (NITROSTAT) 0.4 MG SL tablet Place 1 tablet (0.4 mg total) under the tongue every 5 (  five) minutes as needed for chest pain. 06/21/13   Minus Breeding, MD  ondansetron (ZOFRAN-ODT) 4 MG disintegrating tablet Take 1 tablet (4 mg total) by mouth every 8 (eight) hours as needed for nausea or vomiting. 08/20/14   Elsie Stain, MD  PROAIR HFA 108 (90 BASE) MCG/ACT inhaler USE 2 PUFFS EVERY 4 HOURS AS NEEDED FOR WHEEZING (SHORTNESS OF BREATH) 03/04/14   Elsie Stain, MD  sertraline (ZOLOFT) 50 MG tablet Take 50 mg by mouth 3 (three) times daily. 07/31/14   Historical Provider, MD  temazepam (RESTORIL) 15 MG capsule Take 1 capsule at bedtime. 07/31/14   Historical Provider, MD  traZODone (DESYREL) 100 MG tablet Take 200 mg by mouth at bedtime.    Historical Provider, MD    Family History  Family History  Problem Relation Age of Onset  . Coronary artery disease Mother   . Emphysema Mother   . Colon cancer Father   . Prostate cancer Father     Social History  History   Social History  . Marital Status: Divorced    Spouse Name: N/A  . Number of Children: 3  . Years of Education: N/A   Occupational History  .      Workmen's comp   Social History Main Topics  . Smoking status: Current Every Day Smoker -- 0.50 packs/day    Types: Cigarettes  . Smokeless tobacco: Never Used  . Alcohol Use: Yes     Comment: rarely  . Drug Use: No  . Sexual Activity: Not Currently   Other Topics Concern  . Not on file   Social History Narrative     Review of Systems General:  No chills, fever, night sweats or weight changes.    Cardiovascular:  +++ chest pain, no dyspnea on exertion, edema, orthopnea, palpitations, paroxysmal nocturnal dyspnea. Dermatological: No rash, lesions/masses Respiratory: No cough, dyspnea Urologic: No hematuria, dysuria Abdominal:   No nausea, vomiting, diarrhea, bright red blood per rectum, melena, or hematemesis Neurologic:  No visual changes, wkns, changes in mental status. All other systems reviewed and are otherwise negative except as noted above.  Physical Exam  Blood pressure 144/76, pulse 77, temperature 98.1 F (36.7 C), temperature source Oral, resp. rate 17, SpO2 100 %.  General: Pleasant, NAD Psych: Normal affect. Neuro: Alert and oriented X 3. Moves all extremities spontaneously. HEENT: Normal  Neck: Supple without bruits or JVD. Lungs:  Resp regular and unlabored, CTA. Heart: RRR no s3, s4, or murmurs. Abdomen: Soft, non-tender, non-distended, BS + x 4.  Extremities: No clubbing, cyanosis or edema. DP/PT/Radials 2+ and equal bilaterally.  Labs  Troponin [facility] of Care Test)  Recent Labs  09/17/14 1824  TROPIPOC 0.00   Lab Results  Component Value Date   WBC 10.2 09/17/2014   HGB 12.9 09/17/2014   HCT 38.4 09/17/2014   MCV 102.4* 09/17/2014   PLT 207 09/17/2014     Recent Labs Lab 09/17/14 1807  NA 140  K 3.4*  CL 106  CO2 23  BUN 6  CREATININE 0.65  CALCIUM 8.8  GLUCOSE 85   Lab Results  Component Value Date   CHOL 145 02/22/2013   HDL 51 02/22/2013   LDLCALC 71 02/22/2013   TRIG 116 02/22/2013    Radiology/Studies  Dg Chest 2 View  09/17/2014   CLINICAL DATA:  Acute onset chest pain radiating to left shoulder. Nausea. Previous myocardial infarct.  EXAM: CHEST  2 VIEW  COMPARISON:  08/14/2014  FINDINGS: The heart  size and mediastinal contours are within normal limits. Both lungs are clear. No evidence of pleural effusion. No mass or lymphadenopathy identified. Mild thoracic spine degenerative changes again noted.  IMPRESSION: Stable  exam.  No active cardiopulmonary disease.   Electronically Signed   By: Earle Gell M.D.   On: 09/17/2014 19:14   ECG  Rsr, 71, no acute st/t changes.  ASSESSMENT AND PLAN  1.  Midsternal Chest Pain/CAD:  Pt with a h/o RCA and LCX dzs s/p prior PCI's in 2008/2009.  She has had multiple caths since then - last 07/2013, revealing nonobs dzs.  She has had intermittent chest pain since then, often lasting days at a time.  This has been somewhat more frequent in the past 10 days prompting ER visit.  ECG w/o acute changes.  Trop nl so far.  Observe, cycle CE.  If no obj evidence of ischemia despite prolonged duration of c/p, would not pursue further ischemic evaluation at this time.  Cont asa, plavix, bb, statin.  2.  HTN:  Stable.  3. HL:  Cont statin.  4.  Tob Abuse:  Cessation advised.  5.  Depression/Axiety: w/ h/o SI.  No current SI.  Cont home meds.  Likely playing a role in c/p.  Signed, Murray Hodgkins, NP 09/17/2014, 8:00 PM   Attending note:  Patient seen and examined. Reviewed extensive records and discussed the case with Mr. Sharolyn Douglas NP. I agree with his findings. Kara Lamb presents with a history of recurring, episodic chest pain, worse in the last 10 days, including today during an emotionally stressful situation. She has a history of CAD status post BMS to the RCA and circumflex in 2008 followed by DES placement to the RCA due to in-stent restenosis in 2009. Follow-up cardiac catheterizations including most recently in 07/2013 have demonstrated overall moderate nonobstructive disease. Her last procedure was proceeded by a Myoview demonstrating mid to apical lateral ischemia. Additional history includes significant anxiety and depression with recurrent suicide attempts/drug overdose. She has been under a lot of stress, reports the passing of a grandchild in February, anxiety related to an overbearing neighbor, also seeing a therapist on a weekly basis undergoing some adjustments in her  medications. On evaluation in the ER her ECG shows no acute ST segment changes over her prior tracing in February, initial cardiac markers are normal. She is tearful describing her situation but otherwise comfortable on examination, lungs are clear without labored breathing, cardiac exam with RRR and no gallop or rub. Plan at this time is observation admission, cycle. Cardiac markers and follow-up ECG in the morning. Hopefully, as was the case during her last admission, symptoms will abate and she can be managed medically. Holding off on arranging follow-up ischemic testing at this time unless her symptoms persist or her cardiac markers trend abnormally. Proceeding with a follow-up Myoview could be considered to reassess ischemic burden, however this would need to be compared to her study from last year prior to making a decision about proceeding with any invasive evaluations.  Satira Sark, M.D., F.A.C.C.

## 2014-09-17 NOTE — ED Notes (Signed)
Patient returned from XRay

## 2014-09-17 NOTE — ED Notes (Signed)
Appointment @ [phone]

## 2014-09-17 NOTE — ED Provider Notes (Signed)
Patient presented to the ER with chest pain. Patient reports onset of substernal chest pain radiating to left shoulder 4:30 PM. 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 Patient received nitroglycerin prior to arrival and aspirin. She has not had complete resolution of her pain with nitroglycerin.  Face to face Exam: HEENT - PERRLA Lungs - CTAB Heart - RRR, no M/R/G Abd - S/NT/ND Neuro - alert, oriented x3  Plan: Patient with previous MI and 3 stents in place presents with chest pain. Initial workup unremarkable. Cardiology consult to determine disposition.   Orpah Greek, MD 09/17/14 [phone]

## 2014-09-18 DIAGNOSIS — G8929 Other chronic pain: Secondary | ICD-10-CM | POA: Diagnosis not present

## 2014-09-18 DIAGNOSIS — I2510 Atherosclerotic heart disease of native coronary artery without angina pectoris: Secondary | ICD-10-CM | POA: Diagnosis not present

## 2014-09-18 DIAGNOSIS — E669 Obesity, unspecified: Secondary | ICD-10-CM | POA: Diagnosis not present

## 2014-09-18 DIAGNOSIS — I25118 Atherosclerotic heart disease of native coronary artery with other forms of angina pectoris: Secondary | ICD-10-CM

## 2014-09-18 DIAGNOSIS — R0789 Other chest pain: Secondary | ICD-10-CM

## 2014-09-18 DIAGNOSIS — Z720 Tobacco use: Secondary | ICD-10-CM | POA: Diagnosis not present

## 2014-09-18 DIAGNOSIS — F419 Anxiety disorder, unspecified: Secondary | ICD-10-CM | POA: Diagnosis not present

## 2014-09-18 DIAGNOSIS — R079 Chest pain, unspecified: Secondary | ICD-10-CM | POA: Diagnosis not present

## 2014-09-18 LAB — BASIC METABOLIC PANEL
Anion gap: 8 (ref 5–15)
BUN: 9 mg/dL (ref 6–23)
CALCIUM: 9.2 mg/dL (ref 8.4–10.5)
CHLORIDE: 105 mmol/L (ref 96–112)
CO2: 28 mmol/L (ref 19–32)
CREATININE: 0.62 mg/dL (ref 0.50–1.10)
GFR calc Af Amer: >90 mL/min (ref >90)
GFR calc non Af Amer: >90 mL/min (ref >90)
GLUCOSE: 114 mg/dL — AB (ref 70–99)
POTASSIUM: 3.8 mmol/L (ref 3.5–5.1)
Sodium: 141 mmol/L (ref 135–145)

## 2014-09-18 LAB — TROPONIN I
Troponin I: <0.03 ng/mL (ref <0.031)
Troponin I: <0.03 ng/mL (ref <0.031)

## 2014-09-18 LAB — MRSA PCR SCREENING: MRSA by PCR: NEGATIVE

## 2014-09-18 MED ORDER — SODIUM CHLORIDE 0.9 % IJ SOLN
3.0000 mL | Freq: Two times a day (BID) | INTRAMUSCULAR | Status: DC
  Administered 2014-09-18: 3 mL via INTRAVENOUS

## 2014-09-18 MED ORDER — SODIUM CHLORIDE 0.9 % IV SOLN: 250.0000 mL | INTRAVENOUS | Status: DC | PRN

## 2014-09-18 MED ORDER — METOPROLOL TARTRATE 25 MG PO TABS
25.0000 mg | ORAL_TABLET | Freq: Two times a day (BID) | ORAL | Status: DC
  Administered 2014-09-18 (×2): 25 mg via ORAL
  Filled 2014-09-18 (×3): qty 1

## 2014-09-18 MED ORDER — PANTOPRAZOLE SODIUM 40 MG PO TBEC
40.0000 mg | DELAYED_RELEASE_TABLET | Freq: Every day | ORAL | Status: DC
  Administered 2014-09-18: 40 mg via ORAL
  Filled 2014-09-18: qty 1

## 2014-09-18 MED ORDER — CLOPIDOGREL BISULFATE 75 MG PO TABS
75.0000 mg | ORAL_TABLET | Freq: Every day | ORAL | Status: DC
Start: 2014-09-18 — End: 2014-09-18
  Filled 2014-09-18: qty 1

## 2014-09-18 MED ORDER — LEVOTHYROXINE SODIUM 50 MCG PO TABS
50.0000 ug | ORAL_TABLET | Freq: Every day | ORAL | Status: DC
  Administered 2014-09-18: 50 ug via ORAL
  Filled 2014-09-18: qty 1

## 2014-09-18 MED ORDER — NITROGLYCERIN 0.4 MG SL SUBL: 0.4000 mg | SUBLINGUAL_TABLET | SUBLINGUAL | Status: DC | PRN

## 2014-09-18 MED ORDER — FUROSEMIDE 20 MG PO TABS
20.0000 mg | ORAL_TABLET | Freq: Every day | ORAL | Status: DC
  Administered 2014-09-18: 20 mg via ORAL
  Filled 2014-09-18: qty 1

## 2014-09-18 MED ORDER — TRAZODONE HCL 100 MG PO TABS
200.0000 mg | ORAL_TABLET | Freq: Every day | ORAL | Status: DC
  Administered 2014-09-18: 200 mg via ORAL
  Filled 2014-09-18: qty 2

## 2014-09-18 MED ORDER — ACETAMINOPHEN 500 MG PO TABS: 1000.0000 mg | ORAL_TABLET | Freq: Four times a day (QID) | ORAL | Status: DC | PRN

## 2014-09-18 MED ORDER — ASPIRIN EC 81 MG PO TBEC
81.0000 mg | DELAYED_RELEASE_TABLET | Freq: Every day | ORAL | Status: DC
  Administered 2014-09-18: 81 mg via ORAL
  Filled 2014-09-18: qty 1

## 2014-09-18 MED ORDER — ACETAMINOPHEN 325 MG PO TABS: 650.0000 mg | ORAL_TABLET | ORAL | Status: DC | PRN

## 2014-09-18 MED ORDER — ONDANSETRON 4 MG PO TBDP
4.0000 mg | ORAL_TABLET | Freq: Three times a day (TID) | ORAL | Status: DC | PRN
  Filled 2014-09-18: qty 1

## 2014-09-18 MED ORDER — LOSARTAN POTASSIUM 50 MG PO TABS
50.0000 mg | ORAL_TABLET | Freq: Every day | ORAL | Status: DC
  Administered 2014-09-18: 50 mg via ORAL
  Filled 2014-09-18: qty 1

## 2014-09-18 MED ORDER — ONDANSETRON HCL 4 MG/2ML IJ SOLN: 4.0000 mg | Freq: Four times a day (QID) | INTRAMUSCULAR | Status: DC | PRN

## 2014-09-18 MED ORDER — ARIPIPRAZOLE 10 MG PO TABS
5.0000 mg | ORAL_TABLET | Freq: Every day | ORAL | Status: DC
  Administered 2014-09-18: 5 mg via ORAL
  Filled 2014-09-18: qty 1

## 2014-09-18 MED ORDER — SODIUM CHLORIDE 0.9 % IJ SOLN: 3.0000 mL | INTRAMUSCULAR | Status: DC | PRN

## 2014-09-18 MED ORDER — ALBUTEROL SULFATE (2.5 MG/3ML) 0.083% IN NEBU: 2.5000 mg | INHALATION_SOLUTION | RESPIRATORY_TRACT | Status: DC | PRN

## 2014-09-18 MED ORDER — ATORVASTATIN CALCIUM 40 MG PO TABS
40.0000 mg | ORAL_TABLET | Freq: Every day | ORAL | Status: DC
Start: 2014-09-18 — End: 2014-09-18

## 2014-09-18 MED ORDER — AMLODIPINE BESYLATE 2.5 MG PO TABS
2.5000 mg | ORAL_TABLET | Freq: Every day | ORAL | Status: DC
  Administered 2014-09-18: 2.5 mg via ORAL
  Filled 2014-09-18: qty 1

## 2014-09-18 NOTE — Progress Notes (Signed)
UR completed 

## 2014-09-18 NOTE — Progress Notes (Signed)
Pt asleep.
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
## 2014-09-18 NOTE — Discharge Instructions (Signed)

## 2014-09-18 NOTE — Progress Notes (Signed)
    Subjective:  She walked in hallway without any problem. Denies CP, SOB, palpitation, headache or LE edema.   Objective:  Vital Signs in the last 24 hours: Temp:  [97.5 F (36.4 C)-98.1 F (36.7 C)] 98.1 F (36.7 C) (04/06 0813) Pulse Rate:  [59-88] 67 (04/06 0813) Resp:  [13-28] 18 (04/06 0508) BP: (92-144)/(49-80) 100/66 mmHg (04/06 0813) SpO2:  [88 %-100 %] 95 % (04/06 0813) Weight:  [209 lb 14.4 oz (95.21 kg)] 209 lb 14.4 oz (95.21 kg) (04/05 2354)  Intake/Output from previous day:  Intake/Output Summary (Last 24 hours) at 09/18/14 1059 Last data filed at 09/18/14 0745  Gross per 24 hour  Intake      0 ml  Output   1000 ml  Net  -1000 ml    Physical Exam: General appearance: alert, cooperative, no distress and moderately obese Neck: no carotid bruit, no JVD and supple, symmetrical, trachea midline Lungs: clear to auscultation bilaterally Heart: regular rate and rhythm and no murmur Abdomen: soft, non-tender; bowel sounds normal Extremities: extremities normal, atraumatic, no cyanosis or edema Pulses: Left Dorsalis Pedis present 2+ Right Dorsalis Pedis present 2+ Skin: Skin color, texture, turgor normal. No rashes or lesions Neurologic: Alert and oriented X 3   Rate: 70s  Rhythm: normal sinus rhythm  Lab Results:  Recent Labs  09/17/14 1807  WBC 10.2  HGB 12.9  PLT 207    Recent Labs  09/17/14 1807  NA 140  K 3.4*  CL 106  CO2 23  GLUCOSE 85  BUN 6  CREATININE 0.65    Recent Labs  09/18/14 0040  TROPONINI <0.03   No results for input(s): INR in the last 72 hours.  Imaging: Imaging results have been reviewed  Cardiac Studies:  Assessment/Plan:   Principal Problem:   Atypical chest pain Active Problems:   CAD (coronary artery disease)   HYPERTENSION, BENIGN   Dyslipidemia   Bipolar 1 disorder   Severe major depression without psychotic features   GAD (generalized anxiety disorder)   Asthma, mild intermittent   Chronic chest  pain   Obesity BMI 39   GERD (gastroesophageal reflux disease)   Tobacco use disorder   Chest pain, negative MI   Midsternal chest pain   PLAN: She was symptom free overnight. Denies any SOB, CP, palpation or anxiety. EKG - NSR with rate of 80s this morning. No change from prior EKG. Trop pending. Her symptoms was likely due to anxiety. She can go home if her 3d trop is negative. F/u with Hochrein/APP as an outpatient.   Kerin Ransom PA-C Beeper [phone] 09/18/2014, 10:59 AM   I have seen and examined the patient along with Kerin Ransom.  I have reviewed the chart, notes and new data.  I agree with PA's note.  Key new complaints: atypical symptoms, likely anxiety related Key examination changes: no cardiac abnormalities Key new findings / data: low risk ECG and enzymes (R wave progression abnormality on ECG due to lead placement)  PLAN: DC home with outpt f/u.  Sanda Klein, MD, [place] [phone] 09/18/2014, 1:36 PM

## 2014-09-18 NOTE — Accreditation Note (Signed)
Pt resting in bed c/o 8/10 CP. Ekg obtained. Pt's night time trazodone and prn Klonopin given per pt request.  Md on call paged. Will continue to monitor pt.

## 2014-09-19 ENCOUNTER — Encounter: Payer: Self-pay | Admitting: Cardiology

## 2014-09-20 ENCOUNTER — Ambulatory Visit: Payer: Self-pay | Admitting: Cardiology

## 2014-09-23 ENCOUNTER — Telehealth: Payer: Self-pay | Admitting: *Deleted

## 2014-09-23 NOTE — Telephone Encounter (Signed)
Spoke with patient to question 24/hr BP monitor. Patient was unaware of this but states she does have a B/P monitor at home that she can monitor if needed. Told her I would question Dr.Hochrein and his plan and proceed from there. Patient does state that she has quit smoking and would like for her B/P to be monitored as she feels very anxious and uncomfortable.

## 2014-09-23 NOTE — Telephone Encounter (Signed)
-----   Message from Minus Breeding, MD sent at 09/20/2014  8:28 AM EDT ----- Please check to see when she is getting her 24 hour BP monitor.

## 2014-10-01 ENCOUNTER — Ambulatory Visit: Payer: Self-pay | Admitting: Critical Care Medicine

## 2014-10-24 ENCOUNTER — Telehealth: Payer: Self-pay | Admitting: Critical Care Medicine

## 2014-10-24 DIAGNOSIS — J4520 Mild intermittent asthma, uncomplicated: Secondary | ICD-10-CM

## 2014-10-24 NOTE — Telephone Encounter (Signed)
Spoke to pt. Pt is needing an ONO done to have insurnace to pay for oxygen. According her, [facility]PS told her that since she didn't have an ONO her oxygen has been paid for since 03/2014. ROV has been rescheduled in [place] for 11/19/14 at 3:00pm per pt's request.  PW - please advise if it's okay to order ONO.

## 2014-10-25 NOTE — Telephone Encounter (Signed)
Order has been placed for ONO. Pt is aware. Nothing further was needed.

## 2014-10-25 NOTE — Telephone Encounter (Signed)
lmomtcb for pt 

## 2014-10-25 NOTE — Telephone Encounter (Signed)
Pt cb, [phone]

## 2014-10-25 NOTE — Telephone Encounter (Signed)
I am okay with an room air ono

## 2014-10-29 ENCOUNTER — Ambulatory Visit: Payer: Self-pay | Admitting: Critical Care Medicine

## 2014-11-01 ENCOUNTER — Telehealth: Payer: Self-pay | Admitting: Critical Care Medicine

## 2014-11-01 NOTE — Telephone Encounter (Signed)
Called and left detailed message on voicemail advising patient that we have not received ONO results yet.  Awaiting results.  To Crystal for followup

## 2014-11-04 ENCOUNTER — Telehealth: Payer: Self-pay | Admitting: Critical Care Medicine

## 2014-11-04 DIAGNOSIS — J4520 Mild intermittent asthma, uncomplicated: Secondary | ICD-10-CM

## 2014-11-04 NOTE — Telephone Encounter (Signed)
ONO results in PW's folder for review.

## 2014-11-04 NOTE — Telephone Encounter (Signed)
Called, spoke with pt.  Discussed below ONO results and recs per Dr. Joya Gaskins.  She verbalized understanding. Dr. Joya Gaskins, pt states it was her understanding ONO would be repeated on o2 after RA results were received.  Please advise if this is needed.  Thank you.

## 2014-11-04 NOTE — Telephone Encounter (Signed)
Let pt know ONO on RA : multiple desats so she needs to stay on oxygen at night 2L

## 2014-11-04 NOTE — Telephone Encounter (Signed)
Yes , ok to repeat ONO on oxygen 2L

## 2014-11-04 NOTE — Telephone Encounter (Signed)
Called and spoke to pt. Informed her of the recs per PW. Order placed. Pt verbalized understanding and denied any further questions or concerns at this time.

## 2014-11-07 NOTE — Telephone Encounter (Signed)
Spoke with patient-states someone called her yesterday and went over the results. Pt also stated that she has the ONO with O2 test set for tonight. She is aware that PW will advise as he gets the results. Nothing more needed at this time.

## 2014-11-19 ENCOUNTER — Encounter: Payer: Self-pay | Admitting: Critical Care Medicine

## 2014-11-19 ENCOUNTER — Ambulatory Visit (INDEPENDENT_AMBULATORY_CARE_PROVIDER_SITE_OTHER): Payer: Medicare Other | Admitting: Critical Care Medicine

## 2014-11-19 VITALS — BP 108/60 | HR 75 | Temp 97.9°F | Ht 61.0 in | Wt 218.4 lb

## 2014-11-19 DIAGNOSIS — J418 Mixed simple and mucopurulent chronic bronchitis: Secondary | ICD-10-CM

## 2014-11-19 DIAGNOSIS — Z801 Family history of malignant neoplasm of trachea, bronchus and lung: Secondary | ICD-10-CM

## 2014-11-19 DIAGNOSIS — F17200 Nicotine dependence, unspecified, uncomplicated: Secondary | ICD-10-CM

## 2014-11-19 DIAGNOSIS — J449 Chronic obstructive pulmonary disease, unspecified: Secondary | ICD-10-CM

## 2014-11-19 DIAGNOSIS — R05 Cough: Secondary | ICD-10-CM

## 2014-11-19 DIAGNOSIS — J4489 Other specified chronic obstructive pulmonary disease: Secondary | ICD-10-CM

## 2014-11-19 DIAGNOSIS — R918 Other nonspecific abnormal finding of lung field: Secondary | ICD-10-CM

## 2014-11-19 MED ORDER — ALBUTEROL SULFATE HFA 108 (90 BASE) MCG/ACT IN AERS: INHALATION_SPRAY | RESPIRATORY_TRACT | Status: DC

## 2014-11-19 NOTE — Progress Notes (Signed)
Subjective:    Patient ID: Kara Lamb, female    DOB: 02-24-1956, 59 y.o.   MRN: [id number]  HPI 11/19/2014 Chief Complaint  Patient presents with  . Follow-up    Feeling the same,cough not any better-dk. green,midchest pain/tightness,sob with exertion,wheezing,no fcs  Pt still with cough. Smokes < 10 per day.  Pt with ongoing chest pain.  Pt mother has lung cancer.      Pt denies any significant sore throat, nasal congestion or excess secretions, fever, chills, sweats, unintended weight loss, pleurtic or exertional chest pain, orthopnea PND, or leg swelling Pt denies any increase in rescue therapy over baseline, denies waking up needing it or having any early am or nocturnal exacerbations of coughing/wheezing/or dyspnea. Pt also denies any obvious fluctuation in symptoms with  weather or environmental change or other alleviating or aggravating factors   Current Medications, Allergies, Complete Past Medical History, Past Surgical History, Family History, and Social History were reviewed in [place] record per todays encounter:  11/19/2014  Review of Systems  Constitutional: Negative.   HENT: Negative.  Negative for ear pain, postnasal drip, rhinorrhea, sinus pressure, sore throat, trouble swallowing and voice change.   Eyes: Negative.   Respiratory: Positive for cough, shortness of breath and wheezing. Negative for apnea, choking, chest tightness and stridor.   Cardiovascular: Negative.  Negative for chest pain, palpitations and leg swelling.  Gastrointestinal: Negative.  Negative for nausea, vomiting, abdominal pain and abdominal distention.  Genitourinary: Negative.   Musculoskeletal: Negative.  Negative for myalgias and arthralgias.  Skin: Negative.  Negative for rash.  Allergic/Immunologic: Negative.  Negative for environmental allergies and food allergies.  Neurological: Negative.  Negative for dizziness, syncope, weakness and headaches.  Hematological:  Negative.  Negative for adenopathy. Does not bruise/bleed easily.  Psychiatric/Behavioral: Negative.  Negative for sleep disturbance and agitation. The patient is not nervous/anxious.        Objective:   Physical Exam Filed Vitals:   11/19/14 1449  BP: 108/60  Pulse: 75  Temp: 97.9 F (36.6 C)  TempSrc: Oral  Height: 5\' 1"  (1.549 m)  Weight: 218 lb 6.4 oz (99.066 kg)  SpO2: 97%    Gen: Pleasant, well-nourished, in no distress,  normal affect  ENT: No lesions,  mouth clear,  oropharynx clear, no postnasal drip  Neck: No JVD, no TMG, no carotid bruits  Lungs: No use of accessory muscles, no dullness to percussion, distant BS  Cardiovascular: RRR, heart sounds normal, no murmur or gallops, no peripheral edema  Abdomen: soft and NT, no HSM,  BS normal  Musculoskeletal: No deformities, no cyanosis or clubbing  Neuro: alert, non focal  Skin: Warm, no lesions or rashes  No results found.        Assessment & Plan:  I personally reviewed all images and lab data in the [facility] system as well as any outside material available during this office visit and agree with the  radiology impressions.   Asthmatic bronchitis , chronic Asthmatic bronchitis with fixed degree of mild airway obstruction note fairly normal pulmonary function studies in 2015. Note overnight room air oxygenation showing desaturation now on oxygen therapy and doing well with this with note normal overnight oxygen test on 2 L Plan Maintain albuterol as needed Continue to pursue smoking cessation with nicotine replacement therapy Continue oxygen 2 L at bedtime This patient continues to use and benefit from her oxygen therapy and has re qualified at this visit for continued oxygen therapy  Abnormal CT lung screening Ongoing tobacco use and positive family history for lung cancer Based on this the patient will pursue smoking cessation further and obtain a CT scan of the chest for screening for lung  cancer  Tobacco use disorder Ongoing tobacco use Greater than 5 minutes smoking cessation counseling issued to the patient The patient will continue use nicotine replacement therapy   Kara Lamb was seen today for follow-up.  Diagnoses and all orders for this visit:  Mixed simple and mucopurulent chronic bronchitis Orders: -     CT Chest Wo Contrast; Future -     CT Chest Wo Contrast  Tobacco use disorder Orders: -     CT Chest Wo Contrast; Future -     CT Chest Wo Contrast  Family history of lung cancer Orders: -     CT Chest Wo Contrast; Future -     CT Chest Wo Contrast  Asthmatic bronchitis , chronic  Abnormal CT lung screening  Other orders -     albuterol (PROAIR HFA) 108 (90 BASE) MCG/ACT inhaler; USE 2 PUFFS EVERY 4 HOURS AS NEEDED FOR WHEEZING (SHORTNESS OF BREATH)    I had an extended discussion with the patient and or family lasting 10 minutes of a 25 minute visit including:  Disease state, need for CT scan lung scanning, and need for smoking cessation

## 2014-11-19 NOTE — Patient Instructions (Signed)
Stay on albuterol as needed Obtain CT Chest Follow up in 6 months

## 2014-11-20 DIAGNOSIS — R918 Other nonspecific abnormal finding of lung field: Secondary | ICD-10-CM | POA: Insufficient documentation

## 2014-11-20 NOTE — Assessment & Plan Note (Signed)
Asthmatic bronchitis with fixed degree of mild airway obstruction note fairly normal pulmonary function studies in 2015. Note overnight room air oxygenation showing desaturation now on oxygen therapy and doing well with this with note normal overnight oxygen test on 2 L Plan Maintain albuterol as needed Continue to pursue smoking cessation with nicotine replacement therapy Continue oxygen 2 L at bedtime This patient continues to use and benefit from her oxygen therapy and has re qualified at this visit for continued oxygen therapy

## 2014-11-20 NOTE — Assessment & Plan Note (Signed)
Ongoing tobacco use Greater than 5 minutes smoking cessation counseling issued to the patient The patient will continue use nicotine replacement therapy

## 2014-11-20 NOTE — Assessment & Plan Note (Signed)
Ongoing tobacco use and positive family history for lung cancer Based on this the patient will pursue smoking cessation further and obtain a CT scan of the chest for screening for lung cancer

## 2014-11-25 ENCOUNTER — Encounter: Payer: Self-pay | Admitting: Critical Care Medicine

## 2014-12-09 ENCOUNTER — Encounter: Payer: Self-pay | Admitting: Critical Care Medicine

## 2014-12-12 ENCOUNTER — Other Ambulatory Visit: Payer: Self-pay | Admitting: Cardiology

## 2014-12-12 NOTE — Telephone Encounter (Signed)
REFILL 

## 2014-12-24 ENCOUNTER — Telehealth: Payer: Self-pay | Admitting: Critical Care Medicine

## 2014-12-24 NOTE — Telephone Encounter (Addendum)
Per Dr. Joya Gaskins:   Neg/ Normal No cause for cough identified Patient notified of CT results. Verbalized understanding. No questions or concerns at this time. Nothing further needed.

## 2015-02-04 ENCOUNTER — Ambulatory Visit: Payer: Self-pay | Admitting: Cardiology

## 2015-02-06 ENCOUNTER — Telehealth: Payer: Self-pay | Admitting: Cardiology

## 2015-02-06 MED ORDER — LOSARTAN POTASSIUM 50 MG PO TABS: ORAL_TABLET | ORAL | Status: DC

## 2015-02-06 MED ORDER — FUROSEMIDE 20 MG PO TABS: 20.0000 mg | ORAL_TABLET | Freq: Every day | ORAL | Status: DC

## 2015-02-06 MED ORDER — METOPROLOL TARTRATE 25 MG PO TABS: 25.0000 mg | ORAL_TABLET | Freq: Two times a day (BID) | ORAL | Status: DC

## 2015-02-06 MED ORDER — ATORVASTATIN CALCIUM 40 MG PO TABS: ORAL_TABLET | ORAL | Status: DC

## 2015-02-06 MED ORDER — CLOPIDOGREL BISULFATE 75 MG PO TABS: ORAL_TABLET | ORAL | Status: DC

## 2015-02-06 MED ORDER — AMLODIPINE BESYLATE 2.5 MG PO TABS: ORAL_TABLET | ORAL | Status: DC

## 2015-02-06 NOTE — Telephone Encounter (Signed)
°  1. Which medications need to be refilled? Amlodipine, Atorvastatin,Plavix, Lasik and Losartan/Potassium and Metoprol Tartate  2. Which pharmacy is medication to be sent to? [facility] in [place]  3. Do they need a 30 day or 90 day supply? 30   4. Would they like a call back once the medication has been sent to the pharmacy? no

## 2015-02-06 NOTE — Telephone Encounter (Signed)
E-sent medications to  pharmacy  Left message on patient 's voicemail

## 2015-02-10 ENCOUNTER — Other Ambulatory Visit: Payer: Self-pay | Admitting: Critical Care Medicine

## 2015-03-15 ENCOUNTER — Other Ambulatory Visit: Payer: Self-pay | Admitting: Critical Care Medicine

## 2015-04-09 ENCOUNTER — Ambulatory Visit: Payer: Self-pay | Admitting: Cardiology

## 2015-04-24 ENCOUNTER — Ambulatory Visit: Payer: Self-pay | Admitting: Cardiology

## 2015-06-17 ENCOUNTER — Other Ambulatory Visit: Payer: Self-pay | Admitting: Cardiology

## 2015-06-17 NOTE — Telephone Encounter (Signed)
Rx request sent to pharmacy.  

## 2015-06-26 DIAGNOSIS — F329 Major depressive disorder, single episode, unspecified: Secondary | ICD-10-CM | POA: Diagnosis not present

## 2015-06-27 ENCOUNTER — Ambulatory Visit: Payer: Self-pay | Admitting: Cardiology

## 2015-07-02 DIAGNOSIS — I10 Essential (primary) hypertension: Secondary | ICD-10-CM | POA: Diagnosis not present

## 2015-07-02 DIAGNOSIS — F17210 Nicotine dependence, cigarettes, uncomplicated: Secondary | ICD-10-CM | POA: Diagnosis not present

## 2015-07-02 DIAGNOSIS — I2510 Atherosclerotic heart disease of native coronary artery without angina pectoris: Secondary | ICD-10-CM | POA: Diagnosis not present

## 2015-07-02 DIAGNOSIS — E785 Hyperlipidemia, unspecified: Secondary | ICD-10-CM | POA: Diagnosis not present

## 2015-07-30 DIAGNOSIS — F329 Major depressive disorder, single episode, unspecified: Secondary | ICD-10-CM | POA: Diagnosis not present

## 2015-08-12 DIAGNOSIS — G4733 Obstructive sleep apnea (adult) (pediatric): Secondary | ICD-10-CM | POA: Diagnosis not present

## 2015-08-12 DIAGNOSIS — F17210 Nicotine dependence, cigarettes, uncomplicated: Secondary | ICD-10-CM | POA: Diagnosis not present

## 2015-08-12 DIAGNOSIS — J301 Allergic rhinitis due to pollen: Secondary | ICD-10-CM | POA: Diagnosis not present

## 2015-08-12 DIAGNOSIS — J4520 Mild intermittent asthma, uncomplicated: Secondary | ICD-10-CM | POA: Diagnosis not present

## 2015-08-16 DIAGNOSIS — J4520 Mild intermittent asthma, uncomplicated: Secondary | ICD-10-CM | POA: Diagnosis not present

## 2015-08-18 ENCOUNTER — Other Ambulatory Visit: Payer: Self-pay

## 2015-08-18 MED ORDER — METOPROLOL TARTRATE 25 MG PO TABS: 25.0000 mg | ORAL_TABLET | Freq: Two times a day (BID) | ORAL | Status: DC

## 2015-08-18 MED ORDER — AMLODIPINE BESYLATE 2.5 MG PO TABS: 2.5000 mg | ORAL_TABLET | Freq: Every day | ORAL | Status: DC

## 2015-08-18 MED ORDER — CLOPIDOGREL BISULFATE 75 MG PO TABS: 75.0000 mg | ORAL_TABLET | Freq: Every day | ORAL | Status: AC

## 2015-08-18 MED ORDER — ATORVASTATIN CALCIUM 40 MG PO TABS: 40.0000 mg | ORAL_TABLET | Freq: Every day | ORAL | Status: AC

## 2015-08-18 NOTE — Addendum Note (Signed)
Addended by: Diana Eves on: 08/18/2015 05:14 PM   Modules accepted: Orders

## 2015-08-18 NOTE — Telephone Encounter (Signed)
Rx(s) sent to pharmacy electronically.  

## 2015-08-19 DIAGNOSIS — F314 Bipolar disorder, current episode depressed, severe, without psychotic features: Secondary | ICD-10-CM | POA: Diagnosis not present

## 2015-09-04 DIAGNOSIS — F329 Major depressive disorder, single episode, unspecified: Secondary | ICD-10-CM | POA: Diagnosis not present

## 2015-09-07 ENCOUNTER — Other Ambulatory Visit: Payer: Self-pay | Admitting: Cardiology

## 2015-09-08 NOTE — Telephone Encounter (Signed)
Rx refill sent to pharmacy. 

## 2015-09-10 DIAGNOSIS — F329 Major depressive disorder, single episode, unspecified: Secondary | ICD-10-CM | POA: Diagnosis not present

## 2015-10-08 DIAGNOSIS — Z87891 Personal history of nicotine dependence: Secondary | ICD-10-CM | POA: Diagnosis not present

## 2015-10-08 DIAGNOSIS — G4733 Obstructive sleep apnea (adult) (pediatric): Secondary | ICD-10-CM | POA: Diagnosis not present

## 2015-10-08 DIAGNOSIS — J301 Allergic rhinitis due to pollen: Secondary | ICD-10-CM | POA: Diagnosis not present

## 2015-10-08 DIAGNOSIS — J4520 Mild intermittent asthma, uncomplicated: Secondary | ICD-10-CM | POA: Diagnosis not present

## 2015-10-11 DIAGNOSIS — G4733 Obstructive sleep apnea (adult) (pediatric): Secondary | ICD-10-CM | POA: Diagnosis not present

## 2015-11-04 DIAGNOSIS — Z7982 Long term (current) use of aspirin: Secondary | ICD-10-CM | POA: Diagnosis not present

## 2015-11-04 DIAGNOSIS — J9811 Atelectasis: Secondary | ICD-10-CM | POA: Diagnosis not present

## 2015-11-04 DIAGNOSIS — Z9981 Dependence on supplemental oxygen: Secondary | ICD-10-CM | POA: Diagnosis not present

## 2015-11-04 DIAGNOSIS — R072 Precordial pain: Secondary | ICD-10-CM | POA: Diagnosis not present

## 2015-11-04 DIAGNOSIS — I10 Essential (primary) hypertension: Secondary | ICD-10-CM | POA: Diagnosis not present

## 2015-11-04 DIAGNOSIS — J449 Chronic obstructive pulmonary disease, unspecified: Secondary | ICD-10-CM | POA: Diagnosis not present

## 2015-11-04 DIAGNOSIS — Z955 Presence of coronary angioplasty implant and graft: Secondary | ICD-10-CM | POA: Diagnosis not present

## 2015-11-04 DIAGNOSIS — I2510 Atherosclerotic heart disease of native coronary artery without angina pectoris: Secondary | ICD-10-CM | POA: Diagnosis not present

## 2015-11-04 DIAGNOSIS — Z79899 Other long term (current) drug therapy: Secondary | ICD-10-CM | POA: Diagnosis not present

## 2015-11-04 DIAGNOSIS — E785 Hyperlipidemia, unspecified: Secondary | ICD-10-CM | POA: Diagnosis not present

## 2015-11-04 DIAGNOSIS — R0789 Other chest pain: Secondary | ICD-10-CM | POA: Diagnosis not present

## 2015-11-04 DIAGNOSIS — E039 Hypothyroidism, unspecified: Secondary | ICD-10-CM | POA: Diagnosis not present

## 2015-11-04 DIAGNOSIS — J45909 Unspecified asthma, uncomplicated: Secondary | ICD-10-CM | POA: Diagnosis not present

## 2015-11-04 DIAGNOSIS — Z7902 Long term (current) use of antithrombotics/antiplatelets: Secondary | ICD-10-CM | POA: Diagnosis not present

## 2015-11-04 DIAGNOSIS — F329 Major depressive disorder, single episode, unspecified: Secondary | ICD-10-CM | POA: Diagnosis not present

## 2015-11-04 DIAGNOSIS — R079 Chest pain, unspecified: Secondary | ICD-10-CM | POA: Diagnosis not present

## 2015-11-04 DIAGNOSIS — R071 Chest pain on breathing: Secondary | ICD-10-CM | POA: Diagnosis not present

## 2015-11-04 DIAGNOSIS — Z87891 Personal history of nicotine dependence: Secondary | ICD-10-CM | POA: Diagnosis not present

## 2015-11-04 DIAGNOSIS — R110 Nausea: Secondary | ICD-10-CM | POA: Diagnosis not present

## 2015-11-04 DIAGNOSIS — I252 Old myocardial infarction: Secondary | ICD-10-CM | POA: Diagnosis not present

## 2015-11-05 DIAGNOSIS — R071 Chest pain on breathing: Secondary | ICD-10-CM | POA: Diagnosis not present

## 2015-11-05 DIAGNOSIS — I10 Essential (primary) hypertension: Secondary | ICD-10-CM | POA: Diagnosis not present

## 2015-11-05 DIAGNOSIS — R079 Chest pain, unspecified: Secondary | ICD-10-CM | POA: Diagnosis not present

## 2015-11-05 DIAGNOSIS — Z8679 Personal history of other diseases of the circulatory system: Secondary | ICD-10-CM | POA: Diagnosis not present

## 2015-11-05 DIAGNOSIS — E785 Hyperlipidemia, unspecified: Secondary | ICD-10-CM | POA: Diagnosis not present

## 2015-11-05 DIAGNOSIS — I2510 Atherosclerotic heart disease of native coronary artery without angina pectoris: Secondary | ICD-10-CM | POA: Diagnosis not present

## 2015-11-15 DIAGNOSIS — I252 Old myocardial infarction: Secondary | ICD-10-CM | POA: Diagnosis not present

## 2015-11-15 DIAGNOSIS — J45909 Unspecified asthma, uncomplicated: Secondary | ICD-10-CM | POA: Diagnosis not present

## 2015-11-15 DIAGNOSIS — Z7982 Long term (current) use of aspirin: Secondary | ICD-10-CM | POA: Diagnosis not present

## 2015-11-15 DIAGNOSIS — K219 Gastro-esophageal reflux disease without esophagitis: Secondary | ICD-10-CM | POA: Diagnosis not present

## 2015-11-15 DIAGNOSIS — I2510 Atherosclerotic heart disease of native coronary artery without angina pectoris: Secondary | ICD-10-CM | POA: Diagnosis not present

## 2015-11-15 DIAGNOSIS — R45851 Suicidal ideations: Secondary | ICD-10-CM | POA: Diagnosis not present

## 2015-11-15 DIAGNOSIS — Z87891 Personal history of nicotine dependence: Secondary | ICD-10-CM | POA: Diagnosis not present

## 2015-11-15 DIAGNOSIS — J449 Chronic obstructive pulmonary disease, unspecified: Secondary | ICD-10-CM | POA: Diagnosis not present

## 2015-11-15 DIAGNOSIS — I10 Essential (primary) hypertension: Secondary | ICD-10-CM | POA: Diagnosis not present

## 2015-11-15 DIAGNOSIS — F332 Major depressive disorder, recurrent severe without psychotic features: Secondary | ICD-10-CM | POA: Diagnosis not present

## 2015-11-15 DIAGNOSIS — Z79899 Other long term (current) drug therapy: Secondary | ICD-10-CM | POA: Diagnosis not present

## 2015-11-15 DIAGNOSIS — N390 Urinary tract infection, site not specified: Secondary | ICD-10-CM | POA: Diagnosis not present

## 2015-11-15 DIAGNOSIS — E039 Hypothyroidism, unspecified: Secondary | ICD-10-CM | POA: Diagnosis not present

## 2015-11-16 DIAGNOSIS — Z87891 Personal history of nicotine dependence: Secondary | ICD-10-CM | POA: Diagnosis not present

## 2015-11-16 DIAGNOSIS — I2510 Atherosclerotic heart disease of native coronary artery without angina pectoris: Secondary | ICD-10-CM | POA: Diagnosis present

## 2015-11-16 DIAGNOSIS — Z9114 Patient's other noncompliance with medication regimen: Secondary | ICD-10-CM | POA: Diagnosis not present

## 2015-11-16 DIAGNOSIS — R9431 Abnormal electrocardiogram [ECG] [EKG]: Secondary | ICD-10-CM | POA: Diagnosis not present

## 2015-11-16 DIAGNOSIS — Z9119 Patient's noncompliance with other medical treatment and regimen: Secondary | ICD-10-CM | POA: Diagnosis not present

## 2015-11-16 DIAGNOSIS — G4700 Insomnia, unspecified: Secondary | ICD-10-CM | POA: Diagnosis present

## 2015-11-16 DIAGNOSIS — E559 Vitamin D deficiency, unspecified: Secondary | ICD-10-CM | POA: Diagnosis present

## 2015-11-16 DIAGNOSIS — Z955 Presence of coronary angioplasty implant and graft: Secondary | ICD-10-CM | POA: Diagnosis not present

## 2015-11-16 DIAGNOSIS — L409 Psoriasis, unspecified: Secondary | ICD-10-CM | POA: Diagnosis present

## 2015-11-16 DIAGNOSIS — N390 Urinary tract infection, site not specified: Secondary | ICD-10-CM | POA: Diagnosis present

## 2015-11-16 DIAGNOSIS — R21 Rash and other nonspecific skin eruption: Secondary | ICD-10-CM | POA: Diagnosis present

## 2015-11-16 DIAGNOSIS — R197 Diarrhea, unspecified: Secondary | ICD-10-CM | POA: Diagnosis not present

## 2015-11-16 DIAGNOSIS — Z6837 Body mass index (BMI) 37.0-37.9, adult: Secondary | ICD-10-CM | POA: Diagnosis not present

## 2015-11-16 DIAGNOSIS — E039 Hypothyroidism, unspecified: Secondary | ICD-10-CM | POA: Diagnosis present

## 2015-11-16 DIAGNOSIS — F609 Personality disorder, unspecified: Secondary | ICD-10-CM | POA: Diagnosis present

## 2015-11-16 DIAGNOSIS — G4733 Obstructive sleep apnea (adult) (pediatric): Secondary | ICD-10-CM | POA: Diagnosis present

## 2015-11-16 DIAGNOSIS — Z638 Other specified problems related to primary support group: Secondary | ICD-10-CM | POA: Diagnosis not present

## 2015-11-16 DIAGNOSIS — E669 Obesity, unspecified: Secondary | ICD-10-CM | POA: Diagnosis present

## 2015-11-16 DIAGNOSIS — J449 Chronic obstructive pulmonary disease, unspecified: Secondary | ICD-10-CM | POA: Diagnosis present

## 2015-11-16 DIAGNOSIS — F332 Major depressive disorder, recurrent severe without psychotic features: Secondary | ICD-10-CM | POA: Diagnosis present

## 2015-11-16 DIAGNOSIS — R45851 Suicidal ideations: Secondary | ICD-10-CM | POA: Diagnosis not present

## 2015-11-16 DIAGNOSIS — K5900 Constipation, unspecified: Secondary | ICD-10-CM | POA: Diagnosis present

## 2015-11-16 DIAGNOSIS — Z915 Personal history of self-harm: Secondary | ICD-10-CM | POA: Diagnosis not present

## 2015-11-16 DIAGNOSIS — I10 Essential (primary) hypertension: Secondary | ICD-10-CM | POA: Diagnosis present

## 2015-11-16 DIAGNOSIS — M1990 Unspecified osteoarthritis, unspecified site: Secondary | ICD-10-CM | POA: Diagnosis present

## 2015-12-01 DIAGNOSIS — F314 Bipolar disorder, current episode depressed, severe, without psychotic features: Secondary | ICD-10-CM | POA: Diagnosis not present

## 2015-12-01 DIAGNOSIS — F329 Major depressive disorder, single episode, unspecified: Secondary | ICD-10-CM | POA: Diagnosis not present

## 2016-01-10 DIAGNOSIS — G4733 Obstructive sleep apnea (adult) (pediatric): Secondary | ICD-10-CM | POA: Diagnosis not present

## 2016-02-02 DIAGNOSIS — F314 Bipolar disorder, current episode depressed, severe, without psychotic features: Secondary | ICD-10-CM | POA: Diagnosis not present

## 2016-02-05 DIAGNOSIS — I10 Essential (primary) hypertension: Secondary | ICD-10-CM | POA: Diagnosis not present

## 2016-02-05 DIAGNOSIS — J45909 Unspecified asthma, uncomplicated: Secondary | ICD-10-CM | POA: Diagnosis not present

## 2016-02-05 DIAGNOSIS — L219 Seborrheic dermatitis, unspecified: Secondary | ICD-10-CM | POA: Diagnosis not present

## 2016-02-05 DIAGNOSIS — F329 Major depressive disorder, single episode, unspecified: Secondary | ICD-10-CM | POA: Diagnosis not present

## 2016-02-05 DIAGNOSIS — E785 Hyperlipidemia, unspecified: Secondary | ICD-10-CM | POA: Diagnosis not present

## 2016-02-05 DIAGNOSIS — Z1389 Encounter for screening for other disorder: Secondary | ICD-10-CM | POA: Diagnosis not present

## 2016-02-05 DIAGNOSIS — M1991 Primary osteoarthritis, unspecified site: Secondary | ICD-10-CM | POA: Diagnosis not present

## 2016-02-05 DIAGNOSIS — G4733 Obstructive sleep apnea (adult) (pediatric): Secondary | ICD-10-CM | POA: Diagnosis not present

## 2016-02-05 DIAGNOSIS — M1990 Unspecified osteoarthritis, unspecified site: Secondary | ICD-10-CM | POA: Diagnosis not present

## 2016-02-05 DIAGNOSIS — E039 Hypothyroidism, unspecified: Secondary | ICD-10-CM | POA: Diagnosis not present

## 2016-02-05 DIAGNOSIS — R109 Unspecified abdominal pain: Secondary | ICD-10-CM | POA: Diagnosis not present

## 2016-02-05 DIAGNOSIS — I2510 Atherosclerotic heart disease of native coronary artery without angina pectoris: Secondary | ICD-10-CM | POA: Diagnosis not present

## 2016-02-11 DIAGNOSIS — E785 Hyperlipidemia, unspecified: Secondary | ICD-10-CM | POA: Diagnosis not present

## 2016-02-11 DIAGNOSIS — F17210 Nicotine dependence, cigarettes, uncomplicated: Secondary | ICD-10-CM | POA: Diagnosis not present

## 2016-02-11 DIAGNOSIS — I2510 Atherosclerotic heart disease of native coronary artery without angina pectoris: Secondary | ICD-10-CM | POA: Diagnosis not present

## 2016-02-11 DIAGNOSIS — I10 Essential (primary) hypertension: Secondary | ICD-10-CM | POA: Diagnosis not present

## 2016-02-18 DIAGNOSIS — G4733 Obstructive sleep apnea (adult) (pediatric): Secondary | ICD-10-CM | POA: Diagnosis not present

## 2016-04-08 DIAGNOSIS — Z23 Encounter for immunization: Secondary | ICD-10-CM | POA: Diagnosis not present

## 2016-04-09 DIAGNOSIS — G4700 Insomnia, unspecified: Secondary | ICD-10-CM | POA: Diagnosis not present

## 2016-04-12 DIAGNOSIS — G4733 Obstructive sleep apnea (adult) (pediatric): Secondary | ICD-10-CM | POA: Diagnosis not present

## 2016-04-12 DIAGNOSIS — J4520 Mild intermittent asthma, uncomplicated: Secondary | ICD-10-CM | POA: Diagnosis not present

## 2016-04-12 DIAGNOSIS — Z87891 Personal history of nicotine dependence: Secondary | ICD-10-CM | POA: Diagnosis not present

## 2016-04-12 DIAGNOSIS — J301 Allergic rhinitis due to pollen: Secondary | ICD-10-CM | POA: Diagnosis not present

## 2016-05-28 DIAGNOSIS — G4733 Obstructive sleep apnea (adult) (pediatric): Secondary | ICD-10-CM | POA: Diagnosis not present

## 2016-06-01 DIAGNOSIS — F314 Bipolar disorder, current episode depressed, severe, without psychotic features: Secondary | ICD-10-CM | POA: Diagnosis not present

## 2016-06-20 DIAGNOSIS — R072 Precordial pain: Secondary | ICD-10-CM | POA: Diagnosis not present

## 2016-06-20 DIAGNOSIS — R1013 Epigastric pain: Secondary | ICD-10-CM | POA: Diagnosis not present

## 2016-06-20 DIAGNOSIS — R079 Chest pain, unspecified: Secondary | ICD-10-CM | POA: Diagnosis not present

## 2016-06-20 DIAGNOSIS — I10 Essential (primary) hypertension: Secondary | ICD-10-CM | POA: Diagnosis not present

## 2016-06-20 DIAGNOSIS — J449 Chronic obstructive pulmonary disease, unspecified: Secondary | ICD-10-CM | POA: Diagnosis not present

## 2016-06-20 DIAGNOSIS — I2510 Atherosclerotic heart disease of native coronary artery without angina pectoris: Secondary | ICD-10-CM | POA: Diagnosis not present

## 2016-06-20 DIAGNOSIS — I252 Old myocardial infarction: Secondary | ICD-10-CM | POA: Diagnosis not present

## 2016-06-21 DIAGNOSIS — R1013 Epigastric pain: Secondary | ICD-10-CM | POA: Diagnosis not present

## 2016-06-21 DIAGNOSIS — R079 Chest pain, unspecified: Secondary | ICD-10-CM | POA: Diagnosis not present

## 2016-08-13 DIAGNOSIS — G4733 Obstructive sleep apnea (adult) (pediatric): Secondary | ICD-10-CM | POA: Diagnosis not present

## 2016-08-16 DIAGNOSIS — G4733 Obstructive sleep apnea (adult) (pediatric): Secondary | ICD-10-CM | POA: Diagnosis not present

## 2016-08-16 DIAGNOSIS — J4520 Mild intermittent asthma, uncomplicated: Secondary | ICD-10-CM | POA: Diagnosis not present

## 2016-08-16 DIAGNOSIS — J301 Allergic rhinitis due to pollen: Secondary | ICD-10-CM | POA: Diagnosis not present

## 2016-09-21 DIAGNOSIS — R635 Abnormal weight gain: Secondary | ICD-10-CM | POA: Diagnosis not present

## 2016-09-21 DIAGNOSIS — E785 Hyperlipidemia, unspecified: Secondary | ICD-10-CM | POA: Diagnosis not present

## 2016-09-21 DIAGNOSIS — I2510 Atherosclerotic heart disease of native coronary artery without angina pectoris: Secondary | ICD-10-CM | POA: Diagnosis not present

## 2016-09-21 DIAGNOSIS — E039 Hypothyroidism, unspecified: Secondary | ICD-10-CM | POA: Diagnosis not present

## 2016-09-21 DIAGNOSIS — Z6841 Body Mass Index (BMI) 40.0 and over, adult: Secondary | ICD-10-CM | POA: Diagnosis not present

## 2016-09-21 DIAGNOSIS — I10 Essential (primary) hypertension: Secondary | ICD-10-CM | POA: Diagnosis not present

## 2016-09-28 DIAGNOSIS — F314 Bipolar disorder, current episode depressed, severe, without psychotic features: Secondary | ICD-10-CM | POA: Diagnosis not present

## 2016-09-29 DIAGNOSIS — R7309 Other abnormal glucose: Secondary | ICD-10-CM | POA: Diagnosis not present

## 2016-10-06 DIAGNOSIS — K219 Gastro-esophageal reflux disease without esophagitis: Secondary | ICD-10-CM | POA: Diagnosis not present

## 2016-10-06 DIAGNOSIS — K591 Functional diarrhea: Secondary | ICD-10-CM | POA: Diagnosis not present

## 2016-10-06 DIAGNOSIS — K2270 Barrett's esophagus without dysplasia: Secondary | ICD-10-CM | POA: Diagnosis not present

## 2016-10-29 DIAGNOSIS — Z7982 Long term (current) use of aspirin: Secondary | ICD-10-CM | POA: Diagnosis not present

## 2016-10-29 DIAGNOSIS — Z7902 Long term (current) use of antithrombotics/antiplatelets: Secondary | ICD-10-CM | POA: Diagnosis not present

## 2016-10-29 DIAGNOSIS — K219 Gastro-esophageal reflux disease without esophagitis: Secondary | ICD-10-CM | POA: Diagnosis not present

## 2016-10-29 DIAGNOSIS — R1310 Dysphagia, unspecified: Secondary | ICD-10-CM | POA: Diagnosis not present

## 2016-10-29 DIAGNOSIS — K621 Rectal polyp: Secondary | ICD-10-CM | POA: Diagnosis not present

## 2016-10-29 DIAGNOSIS — Z7901 Long term (current) use of anticoagulants: Secondary | ICD-10-CM | POA: Diagnosis not present

## 2016-10-29 DIAGNOSIS — J449 Chronic obstructive pulmonary disease, unspecified: Secondary | ICD-10-CM | POA: Diagnosis not present

## 2016-10-29 DIAGNOSIS — K589 Irritable bowel syndrome without diarrhea: Secondary | ICD-10-CM | POA: Diagnosis not present

## 2016-10-29 DIAGNOSIS — Z79899 Other long term (current) drug therapy: Secondary | ICD-10-CM | POA: Diagnosis not present

## 2016-10-29 DIAGNOSIS — Z955 Presence of coronary angioplasty implant and graft: Secondary | ICD-10-CM | POA: Diagnosis not present

## 2016-10-29 DIAGNOSIS — Z86010 Personal history of colonic polyps: Secondary | ICD-10-CM | POA: Diagnosis not present

## 2016-10-29 DIAGNOSIS — I2510 Atherosclerotic heart disease of native coronary artery without angina pectoris: Secondary | ICD-10-CM | POA: Diagnosis not present

## 2016-10-29 DIAGNOSIS — Z87891 Personal history of nicotine dependence: Secondary | ICD-10-CM | POA: Diagnosis not present

## 2016-10-29 DIAGNOSIS — I10 Essential (primary) hypertension: Secondary | ICD-10-CM | POA: Diagnosis not present

## 2016-10-29 DIAGNOSIS — K648 Other hemorrhoids: Secondary | ICD-10-CM | POA: Diagnosis not present

## 2016-10-29 DIAGNOSIS — K449 Diaphragmatic hernia without obstruction or gangrene: Secondary | ICD-10-CM | POA: Diagnosis not present

## 2016-10-29 DIAGNOSIS — D128 Benign neoplasm of rectum: Secondary | ICD-10-CM | POA: Diagnosis not present

## 2016-10-29 DIAGNOSIS — J45909 Unspecified asthma, uncomplicated: Secondary | ICD-10-CM | POA: Diagnosis not present

## 2016-10-29 DIAGNOSIS — K2270 Barrett's esophagus without dysplasia: Secondary | ICD-10-CM | POA: Diagnosis not present

## 2016-11-02 DIAGNOSIS — G4733 Obstructive sleep apnea (adult) (pediatric): Secondary | ICD-10-CM | POA: Diagnosis not present

## 2017-01-13 DIAGNOSIS — F331 Major depressive disorder, recurrent, moderate: Secondary | ICD-10-CM | POA: Diagnosis not present

## 2017-02-04 DIAGNOSIS — F331 Major depressive disorder, recurrent, moderate: Secondary | ICD-10-CM | POA: Diagnosis not present

## 2017-02-11 DIAGNOSIS — F314 Bipolar disorder, current episode depressed, severe, without psychotic features: Secondary | ICD-10-CM | POA: Diagnosis not present

## 2017-02-17 DIAGNOSIS — F331 Major depressive disorder, recurrent, moderate: Secondary | ICD-10-CM | POA: Diagnosis not present

## 2017-03-17 DIAGNOSIS — F331 Major depressive disorder, recurrent, moderate: Secondary | ICD-10-CM | POA: Diagnosis not present

## 2017-03-27 DIAGNOSIS — Z23 Encounter for immunization: Secondary | ICD-10-CM | POA: Diagnosis not present

## 2017-03-28 DIAGNOSIS — N200 Calculus of kidney: Secondary | ICD-10-CM | POA: Diagnosis not present

## 2017-03-28 DIAGNOSIS — E1165 Type 2 diabetes mellitus with hyperglycemia: Secondary | ICD-10-CM | POA: Diagnosis not present

## 2017-03-28 DIAGNOSIS — R7989 Other specified abnormal findings of blood chemistry: Secondary | ICD-10-CM | POA: Diagnosis not present

## 2017-03-28 DIAGNOSIS — R109 Unspecified abdominal pain: Secondary | ICD-10-CM | POA: Diagnosis not present

## 2017-03-28 DIAGNOSIS — K8020 Calculus of gallbladder without cholecystitis without obstruction: Secondary | ICD-10-CM | POA: Diagnosis not present

## 2017-03-29 DIAGNOSIS — I10 Essential (primary) hypertension: Secondary | ICD-10-CM | POA: Diagnosis not present

## 2017-03-29 DIAGNOSIS — Z6841 Body Mass Index (BMI) 40.0 and over, adult: Secondary | ICD-10-CM | POA: Diagnosis not present

## 2017-03-29 DIAGNOSIS — E785 Hyperlipidemia, unspecified: Secondary | ICD-10-CM | POA: Diagnosis not present

## 2017-03-29 DIAGNOSIS — E1165 Type 2 diabetes mellitus with hyperglycemia: Secondary | ICD-10-CM | POA: Diagnosis not present

## 2017-03-31 DIAGNOSIS — F331 Major depressive disorder, recurrent, moderate: Secondary | ICD-10-CM | POA: Diagnosis not present

## 2017-04-11 DIAGNOSIS — K219 Gastro-esophageal reflux disease without esophagitis: Secondary | ICD-10-CM | POA: Diagnosis not present

## 2017-04-11 DIAGNOSIS — I10 Essential (primary) hypertension: Secondary | ICD-10-CM | POA: Diagnosis not present

## 2017-04-11 DIAGNOSIS — K5900 Constipation, unspecified: Secondary | ICD-10-CM | POA: Diagnosis not present

## 2017-04-11 DIAGNOSIS — E118 Type 2 diabetes mellitus with unspecified complications: Secondary | ICD-10-CM | POA: Diagnosis not present

## 2017-04-15 DIAGNOSIS — F314 Bipolar disorder, current episode depressed, severe, without psychotic features: Secondary | ICD-10-CM | POA: Diagnosis not present

## 2017-05-02 DIAGNOSIS — Z955 Presence of coronary angioplasty implant and graft: Secondary | ICD-10-CM | POA: Diagnosis not present

## 2017-05-02 DIAGNOSIS — E118 Type 2 diabetes mellitus with unspecified complications: Secondary | ICD-10-CM | POA: Diagnosis not present

## 2017-05-02 DIAGNOSIS — I10 Essential (primary) hypertension: Secondary | ICD-10-CM | POA: Diagnosis not present

## 2017-05-02 DIAGNOSIS — E785 Hyperlipidemia, unspecified: Secondary | ICD-10-CM | POA: Diagnosis not present

## 2017-05-02 DIAGNOSIS — I2510 Atherosclerotic heart disease of native coronary artery without angina pectoris: Secondary | ICD-10-CM | POA: Diagnosis not present

## 2017-05-02 DIAGNOSIS — Z87891 Personal history of nicotine dependence: Secondary | ICD-10-CM | POA: Diagnosis not present

## 2017-05-04 DIAGNOSIS — F331 Major depressive disorder, recurrent, moderate: Secondary | ICD-10-CM | POA: Diagnosis not present

## 2017-05-30 DIAGNOSIS — F314 Bipolar disorder, current episode depressed, severe, without psychotic features: Secondary | ICD-10-CM | POA: Diagnosis not present

## 2017-06-02 DIAGNOSIS — F331 Major depressive disorder, recurrent, moderate: Secondary | ICD-10-CM | POA: Diagnosis not present

## 2017-06-03 DIAGNOSIS — E119 Type 2 diabetes mellitus without complications: Secondary | ICD-10-CM | POA: Diagnosis not present

## 2017-07-27 DIAGNOSIS — I10 Essential (primary) hypertension: Secondary | ICD-10-CM | POA: Diagnosis not present

## 2017-07-27 DIAGNOSIS — E785 Hyperlipidemia, unspecified: Secondary | ICD-10-CM | POA: Diagnosis not present

## 2017-07-27 DIAGNOSIS — I2510 Atherosclerotic heart disease of native coronary artery without angina pectoris: Secondary | ICD-10-CM | POA: Diagnosis not present

## 2017-07-27 DIAGNOSIS — E119 Type 2 diabetes mellitus without complications: Secondary | ICD-10-CM | POA: Diagnosis not present

## 2017-07-27 DIAGNOSIS — E039 Hypothyroidism, unspecified: Secondary | ICD-10-CM | POA: Diagnosis not present

## 2017-08-18 DIAGNOSIS — F314 Bipolar disorder, current episode depressed, severe, without psychotic features: Secondary | ICD-10-CM | POA: Diagnosis not present

## 2017-10-24 DIAGNOSIS — R42 Dizziness and giddiness: Secondary | ICD-10-CM | POA: Diagnosis not present

## 2017-10-24 DIAGNOSIS — E039 Hypothyroidism, unspecified: Secondary | ICD-10-CM | POA: Diagnosis not present

## 2017-10-24 DIAGNOSIS — E119 Type 2 diabetes mellitus without complications: Secondary | ICD-10-CM | POA: Diagnosis not present

## 2017-10-28 DIAGNOSIS — S0990XA Unspecified injury of head, initial encounter: Secondary | ICD-10-CM | POA: Diagnosis not present

## 2017-10-28 DIAGNOSIS — R42 Dizziness and giddiness: Secondary | ICD-10-CM | POA: Diagnosis not present

## 2017-10-28 DIAGNOSIS — R110 Nausea: Secondary | ICD-10-CM | POA: Diagnosis not present

## 2017-11-09 DIAGNOSIS — R42 Dizziness and giddiness: Secondary | ICD-10-CM | POA: Diagnosis not present

## 2017-11-21 DIAGNOSIS — F314 Bipolar disorder, current episode depressed, severe, without psychotic features: Secondary | ICD-10-CM | POA: Diagnosis not present

## 2017-11-23 DIAGNOSIS — I10 Essential (primary) hypertension: Secondary | ICD-10-CM | POA: Diagnosis not present

## 2017-11-23 DIAGNOSIS — Z87891 Personal history of nicotine dependence: Secondary | ICD-10-CM | POA: Diagnosis not present

## 2017-11-23 DIAGNOSIS — E785 Hyperlipidemia, unspecified: Secondary | ICD-10-CM | POA: Diagnosis not present

## 2017-11-23 DIAGNOSIS — Z955 Presence of coronary angioplasty implant and graft: Secondary | ICD-10-CM | POA: Diagnosis not present

## 2017-11-23 DIAGNOSIS — I2510 Atherosclerotic heart disease of native coronary artery without angina pectoris: Secondary | ICD-10-CM | POA: Diagnosis not present

## 2017-11-23 DIAGNOSIS — E118 Type 2 diabetes mellitus with unspecified complications: Secondary | ICD-10-CM | POA: Diagnosis not present

## 2017-12-06 DIAGNOSIS — R42 Dizziness and giddiness: Secondary | ICD-10-CM | POA: Diagnosis not present

## 2018-02-10 DIAGNOSIS — E039 Hypothyroidism, unspecified: Secondary | ICD-10-CM | POA: Diagnosis not present

## 2018-02-10 DIAGNOSIS — E119 Type 2 diabetes mellitus without complications: Secondary | ICD-10-CM | POA: Diagnosis not present

## 2018-02-10 DIAGNOSIS — I10 Essential (primary) hypertension: Secondary | ICD-10-CM | POA: Diagnosis not present

## 2018-03-07 DIAGNOSIS — Z23 Encounter for immunization: Secondary | ICD-10-CM | POA: Diagnosis not present

## 2018-03-21 DIAGNOSIS — F603 Borderline personality disorder: Secondary | ICD-10-CM | POA: Diagnosis not present

## 2018-05-13 DIAGNOSIS — M5440 Lumbago with sciatica, unspecified side: Secondary | ICD-10-CM | POA: Diagnosis not present

## 2018-05-22 DIAGNOSIS — J329 Chronic sinusitis, unspecified: Secondary | ICD-10-CM | POA: Diagnosis not present

## 2018-06-04 DIAGNOSIS — I491 Atrial premature depolarization: Secondary | ICD-10-CM | POA: Diagnosis not present

## 2018-06-04 DIAGNOSIS — Z79899 Other long term (current) drug therapy: Secondary | ICD-10-CM | POA: Diagnosis not present

## 2018-06-04 DIAGNOSIS — I10 Essential (primary) hypertension: Secondary | ICD-10-CM | POA: Diagnosis not present

## 2018-06-04 DIAGNOSIS — R112 Nausea with vomiting, unspecified: Secondary | ICD-10-CM | POA: Diagnosis not present

## 2018-06-04 DIAGNOSIS — R1084 Generalized abdominal pain: Secondary | ICD-10-CM | POA: Diagnosis not present

## 2018-06-04 DIAGNOSIS — R1030 Lower abdominal pain, unspecified: Secondary | ICD-10-CM | POA: Diagnosis not present

## 2018-06-04 DIAGNOSIS — N201 Calculus of ureter: Secondary | ICD-10-CM | POA: Diagnosis not present

## 2018-06-04 DIAGNOSIS — N200 Calculus of kidney: Secondary | ICD-10-CM | POA: Diagnosis not present

## 2018-06-21 DIAGNOSIS — E039 Hypothyroidism, unspecified: Secondary | ICD-10-CM | POA: Diagnosis not present

## 2018-06-21 DIAGNOSIS — E782 Mixed hyperlipidemia: Secondary | ICD-10-CM | POA: Diagnosis not present

## 2018-06-21 DIAGNOSIS — E1169 Type 2 diabetes mellitus with other specified complication: Secondary | ICD-10-CM | POA: Diagnosis not present

## 2018-06-21 DIAGNOSIS — I10 Essential (primary) hypertension: Secondary | ICD-10-CM | POA: Diagnosis not present

## 2018-10-06 DIAGNOSIS — F314 Bipolar disorder, current episode depressed, severe, without psychotic features: Secondary | ICD-10-CM | POA: Diagnosis not present

## 2018-10-11 DIAGNOSIS — F3132 Bipolar disorder, current episode depressed, moderate: Secondary | ICD-10-CM | POA: Diagnosis not present

## 2018-10-11 DIAGNOSIS — E782 Mixed hyperlipidemia: Secondary | ICD-10-CM | POA: Diagnosis not present

## 2018-10-11 DIAGNOSIS — E1169 Type 2 diabetes mellitus with other specified complication: Secondary | ICD-10-CM | POA: Diagnosis not present

## 2018-10-11 DIAGNOSIS — E039 Hypothyroidism, unspecified: Secondary | ICD-10-CM | POA: Diagnosis not present

## 2018-10-11 DIAGNOSIS — I10 Essential (primary) hypertension: Secondary | ICD-10-CM | POA: Diagnosis not present

## 2018-11-15 DIAGNOSIS — F17200 Nicotine dependence, unspecified, uncomplicated: Secondary | ICD-10-CM | POA: Diagnosis not present

## 2018-11-15 DIAGNOSIS — R079 Chest pain, unspecified: Secondary | ICD-10-CM | POA: Diagnosis not present

## 2018-11-15 DIAGNOSIS — R0789 Other chest pain: Secondary | ICD-10-CM | POA: Diagnosis not present

## 2018-11-15 DIAGNOSIS — I2510 Atherosclerotic heart disease of native coronary artery without angina pectoris: Secondary | ICD-10-CM | POA: Diagnosis not present

## 2018-11-15 DIAGNOSIS — E118 Type 2 diabetes mellitus with unspecified complications: Secondary | ICD-10-CM | POA: Diagnosis not present

## 2018-11-15 DIAGNOSIS — I10 Essential (primary) hypertension: Secondary | ICD-10-CM | POA: Diagnosis not present

## 2018-11-15 DIAGNOSIS — Z87891 Personal history of nicotine dependence: Secondary | ICD-10-CM | POA: Diagnosis not present

## 2018-11-15 DIAGNOSIS — E785 Hyperlipidemia, unspecified: Secondary | ICD-10-CM | POA: Diagnosis not present

## 2018-11-15 DIAGNOSIS — Z955 Presence of coronary angioplasty implant and graft: Secondary | ICD-10-CM | POA: Diagnosis not present

## 2018-11-23 DIAGNOSIS — R0789 Other chest pain: Secondary | ICD-10-CM | POA: Diagnosis not present

## 2018-11-23 DIAGNOSIS — T82855A Stenosis of coronary artery stent, initial encounter: Secondary | ICD-10-CM | POA: Diagnosis not present

## 2018-11-23 DIAGNOSIS — I2510 Atherosclerotic heart disease of native coronary artery without angina pectoris: Secondary | ICD-10-CM | POA: Diagnosis not present

## 2018-11-28 DIAGNOSIS — E119 Type 2 diabetes mellitus without complications: Secondary | ICD-10-CM | POA: Diagnosis not present

## 2019-01-15 DIAGNOSIS — H43811 Vitreous degeneration, right eye: Secondary | ICD-10-CM | POA: Diagnosis not present

## 2019-01-23 DIAGNOSIS — Z6839 Body mass index (BMI) 39.0-39.9, adult: Secondary | ICD-10-CM | POA: Diagnosis not present

## 2019-01-23 DIAGNOSIS — Z1389 Encounter for screening for other disorder: Secondary | ICD-10-CM | POA: Diagnosis not present

## 2019-01-23 DIAGNOSIS — I10 Essential (primary) hypertension: Secondary | ICD-10-CM | POA: Diagnosis not present

## 2019-01-23 DIAGNOSIS — Z0000 Encounter for general adult medical examination without abnormal findings: Secondary | ICD-10-CM | POA: Diagnosis not present

## 2019-01-23 DIAGNOSIS — E1169 Type 2 diabetes mellitus with other specified complication: Secondary | ICD-10-CM | POA: Diagnosis not present

## 2019-01-23 DIAGNOSIS — E782 Mixed hyperlipidemia: Secondary | ICD-10-CM | POA: Diagnosis not present

## 2019-01-23 DIAGNOSIS — I2510 Atherosclerotic heart disease of native coronary artery without angina pectoris: Secondary | ICD-10-CM | POA: Diagnosis not present

## 2019-01-23 DIAGNOSIS — Z124 Encounter for screening for malignant neoplasm of cervix: Secondary | ICD-10-CM | POA: Diagnosis not present

## 2019-01-23 DIAGNOSIS — E039 Hypothyroidism, unspecified: Secondary | ICD-10-CM | POA: Diagnosis not present

## 2019-02-16 DIAGNOSIS — Z23 Encounter for immunization: Secondary | ICD-10-CM | POA: Diagnosis not present

## 2019-03-05 DIAGNOSIS — R0789 Other chest pain: Secondary | ICD-10-CM | POA: Diagnosis not present

## 2019-03-05 DIAGNOSIS — I10 Essential (primary) hypertension: Secondary | ICD-10-CM | POA: Diagnosis not present

## 2019-03-05 DIAGNOSIS — F17200 Nicotine dependence, unspecified, uncomplicated: Secondary | ICD-10-CM | POA: Diagnosis not present

## 2019-03-05 DIAGNOSIS — E785 Hyperlipidemia, unspecified: Secondary | ICD-10-CM | POA: Diagnosis not present

## 2019-03-05 DIAGNOSIS — Z955 Presence of coronary angioplasty implant and graft: Secondary | ICD-10-CM | POA: Diagnosis not present

## 2019-03-05 DIAGNOSIS — I2510 Atherosclerotic heart disease of native coronary artery without angina pectoris: Secondary | ICD-10-CM | POA: Diagnosis not present

## 2019-04-30 DIAGNOSIS — Z20828 Contact with and (suspected) exposure to other viral communicable diseases: Secondary | ICD-10-CM | POA: Diagnosis not present

## 2019-05-16 DIAGNOSIS — I2510 Atherosclerotic heart disease of native coronary artery without angina pectoris: Secondary | ICD-10-CM | POA: Diagnosis not present

## 2019-05-16 DIAGNOSIS — R945 Abnormal results of liver function studies: Secondary | ICD-10-CM | POA: Diagnosis not present

## 2019-05-16 DIAGNOSIS — E1169 Type 2 diabetes mellitus with other specified complication: Secondary | ICD-10-CM | POA: Diagnosis not present

## 2019-05-16 DIAGNOSIS — E782 Mixed hyperlipidemia: Secondary | ICD-10-CM | POA: Diagnosis not present

## 2019-05-23 DIAGNOSIS — F314 Bipolar disorder, current episode depressed, severe, without psychotic features: Secondary | ICD-10-CM | POA: Diagnosis not present

## 2019-07-13 DIAGNOSIS — F314 Bipolar disorder, current episode depressed, severe, without psychotic features: Secondary | ICD-10-CM | POA: Diagnosis not present

## 2019-07-13 DIAGNOSIS — Z23 Encounter for immunization: Secondary | ICD-10-CM | POA: Diagnosis not present

## 2019-07-27 DIAGNOSIS — F314 Bipolar disorder, current episode depressed, severe, without psychotic features: Secondary | ICD-10-CM | POA: Diagnosis not present

## 2019-08-10 DIAGNOSIS — I2510 Atherosclerotic heart disease of native coronary artery without angina pectoris: Secondary | ICD-10-CM | POA: Diagnosis not present

## 2019-08-10 DIAGNOSIS — Z955 Presence of coronary angioplasty implant and graft: Secondary | ICD-10-CM | POA: Diagnosis not present

## 2019-08-10 DIAGNOSIS — I10 Essential (primary) hypertension: Secondary | ICD-10-CM | POA: Diagnosis not present

## 2019-08-10 DIAGNOSIS — F17200 Nicotine dependence, unspecified, uncomplicated: Secondary | ICD-10-CM | POA: Diagnosis not present

## 2019-08-10 DIAGNOSIS — E785 Hyperlipidemia, unspecified: Secondary | ICD-10-CM | POA: Diagnosis not present

## 2019-08-11 DIAGNOSIS — Z20828 Contact with and (suspected) exposure to other viral communicable diseases: Secondary | ICD-10-CM | POA: Diagnosis not present

## 2019-08-16 DIAGNOSIS — Z20828 Contact with and (suspected) exposure to other viral communicable diseases: Secondary | ICD-10-CM | POA: Diagnosis not present

## 2019-08-22 DIAGNOSIS — F314 Bipolar disorder, current episode depressed, severe, without psychotic features: Secondary | ICD-10-CM | POA: Diagnosis not present

## 2019-09-12 DIAGNOSIS — I2510 Atherosclerotic heart disease of native coronary artery without angina pectoris: Secondary | ICD-10-CM | POA: Diagnosis not present

## 2019-09-12 DIAGNOSIS — E782 Mixed hyperlipidemia: Secondary | ICD-10-CM | POA: Diagnosis not present

## 2019-09-12 DIAGNOSIS — R945 Abnormal results of liver function studies: Secondary | ICD-10-CM | POA: Diagnosis not present

## 2019-09-12 DIAGNOSIS — E1169 Type 2 diabetes mellitus with other specified complication: Secondary | ICD-10-CM | POA: Diagnosis not present

## 2019-09-21 DIAGNOSIS — F314 Bipolar disorder, current episode depressed, severe, without psychotic features: Secondary | ICD-10-CM | POA: Diagnosis not present

## 2019-09-30 DIAGNOSIS — Z03818 Encounter for observation for suspected exposure to other biological agents ruled out: Secondary | ICD-10-CM | POA: Diagnosis not present

## 2019-09-30 DIAGNOSIS — Z20828 Contact with and (suspected) exposure to other viral communicable diseases: Secondary | ICD-10-CM | POA: Diagnosis not present

## 2019-10-02 DIAGNOSIS — R110 Nausea: Secondary | ICD-10-CM | POA: Diagnosis not present

## 2019-10-02 DIAGNOSIS — R509 Fever, unspecified: Secondary | ICD-10-CM | POA: Diagnosis not present

## 2019-10-02 DIAGNOSIS — R109 Unspecified abdominal pain: Secondary | ICD-10-CM | POA: Diagnosis not present

## 2019-10-09 DIAGNOSIS — R109 Unspecified abdominal pain: Secondary | ICD-10-CM | POA: Diagnosis not present

## 2019-10-09 DIAGNOSIS — I700 Atherosclerosis of aorta: Secondary | ICD-10-CM | POA: Diagnosis not present

## 2019-10-12 DIAGNOSIS — R509 Fever, unspecified: Secondary | ICD-10-CM | POA: Diagnosis not present

## 2019-10-16 DIAGNOSIS — R509 Fever, unspecified: Secondary | ICD-10-CM | POA: Diagnosis not present

## 2019-10-16 DIAGNOSIS — R1011 Right upper quadrant pain: Secondary | ICD-10-CM | POA: Diagnosis not present

## 2019-11-01 DIAGNOSIS — E039 Hypothyroidism, unspecified: Secondary | ICD-10-CM | POA: Diagnosis not present

## 2019-11-01 DIAGNOSIS — R509 Fever, unspecified: Secondary | ICD-10-CM | POA: Diagnosis not present

## 2019-11-02 DIAGNOSIS — F314 Bipolar disorder, current episode depressed, severe, without psychotic features: Secondary | ICD-10-CM | POA: Diagnosis not present

## 2019-11-06 DIAGNOSIS — Z1231 Encounter for screening mammogram for malignant neoplasm of breast: Secondary | ICD-10-CM | POA: Diagnosis not present

## 2019-11-15 ENCOUNTER — Encounter: Payer: Self-pay | Admitting: Infectious Disease

## 2019-11-15 ENCOUNTER — Ambulatory Visit (INDEPENDENT_AMBULATORY_CARE_PROVIDER_SITE_OTHER): Payer: Medicare Other | Admitting: Infectious Disease

## 2019-11-15 ENCOUNTER — Other Ambulatory Visit: Payer: Self-pay

## 2019-11-15 VITALS — Ht 60.0 in | Wt 198.0 lb

## 2019-11-15 DIAGNOSIS — D500 Iron deficiency anemia secondary to blood loss (chronic): Secondary | ICD-10-CM | POA: Diagnosis not present

## 2019-11-15 DIAGNOSIS — R509 Fever, unspecified: Secondary | ICD-10-CM

## 2019-11-15 DIAGNOSIS — Z114 Encounter for screening for human immunodeficiency virus [HIV]: Secondary | ICD-10-CM

## 2019-11-15 DIAGNOSIS — R1011 Right upper quadrant pain: Secondary | ICD-10-CM | POA: Diagnosis not present

## 2019-11-15 DIAGNOSIS — R918 Other nonspecific abnormal finding of lung field: Secondary | ICD-10-CM

## 2019-11-15 DIAGNOSIS — I10 Essential (primary) hypertension: Secondary | ICD-10-CM

## 2019-11-15 DIAGNOSIS — Z5320 Procedure and treatment not carried out because of patient's decision for unspecified reasons: Secondary | ICD-10-CM | POA: Diagnosis not present

## 2019-11-15 HISTORY — DX: Fever, unspecified: R50.9

## 2019-11-15 NOTE — Progress Notes (Signed)
Subjective:   Reason for infectious disease consult: Fever of unknown origin Requesting physician:: Nunzio Cory, NP   Patient ID: Kara Lamb, female    DOB: Jun 27, 1955, 64 y.o.   MRN: [id number]  HPI  Kara Lamb is a 64 year old Caucasian female with a past medical history significant for coronary artery disease, asthma, pression who had to mRNA-based Covid vaccines with the last one being in April.  Roughly 4 days after receiving the vaccine she developed fevers to 101 102 degrees chills and myalgias.  Ever since then she has suffered from symptoms of chills with subjective fevers largely in the low 100 degree to 99 degree range when measured.  She has been on amoxicillin without a clear target for this antibiotic without improvement she is also been on doxycycline again with no improvement in symptoms.  She has had lab work done at her PCPs office which showed light mild leukocytosis a sed rate that was 39 but listed as normal with a reference range of that lab.    She has apparently had a CT of the abdomen and pelvis which was reportedly normal.  She is also had an ultrasound of the right upper quadrant which was normal.  She has been tested for Covid which has been negative.  Her only other symptom that is focal is some left hip pain that is been present for years.  Her lifetime travels been along the [place] with no foreign travel whatsoever no exposure to tuberculosis  Past Medical History:  Diagnosis Date  . Anxiety and depression   . Arthritis   . Asthma   . CAD (coronary artery disease)    a. s/p BMS to RCA and LCX 2008; b. ISR of RCA rx'd with Xience DES 12/09; c. Requires extensive sedation for cath; d. cath 11/10: nonobs; e.Cath 2/12: nonobs; f. 05/2011 Cath: nonobs; g. 02/2013 nonobs; h. 07/2013 lat isch on nuc-->cath: nonobstructive, EF 60-65%.  . Chronic chest pain   . Colitis   . Complication of anesthesia   . FUO (fever of unknown origin) 11/15/2019  .  GERD (gastroesophageal reflux disease)   . H/O ETOH abuse   . H/O: suicide attempt    a. multiple  . Hyperlipidemia   . Hypertension   . Obesity   . Tobacco abuse     Past Surgical History:  Procedure Laterality Date  . CARPAL TUNNEL RELEASE    . CESAREAN SECTION     x 3  . COLON BIOPSY  08/12/2008  . LEFT HEART CATHETERIZATION WITH CORONARY ANGIOGRAM  06/04/2011   Procedure: LEFT HEART CATHETERIZATION WITH CORONARY ANGIOGRAM;  Surgeon: Burnell Blanks, MD;  Location: [facility] CATH LAB;  Service: Cardiovascular;;  . LEFT HEART CATHETERIZATION WITH CORONARY ANGIOGRAM N/A 02/22/2013   Procedure: LEFT HEART CATHETERIZATION WITH CORONARY ANGIOGRAM;  Surgeon: Ramond Dial, MD;  Location: [facility] CATH LAB;  Service: Cardiovascular;  Laterality: N/A;  . LEFT HEART CATHETERIZATION WITH CORONARY ANGIOGRAM N/A 07/25/2013   Procedure: LEFT HEART CATHETERIZATION WITH CORONARY ANGIOGRAM;  Surgeon: Sinclair Grooms, MD;  Location: [facility] CATH LAB;  Service: Cardiovascular;  Laterality: N/A;  . PTCA     stent  . TUBAL LIGATION  1982    Family History  Problem Relation Age of Onset  . Coronary artery disease Mother   . Emphysema Mother   . Colon cancer Father   . Prostate cancer Father       Social History   Socioeconomic History  . Marital  status: Divorced    Spouse name: Not on file  . Number of children: 3  . Years of education: Not on file  . Highest education level: Not on file  Occupational History    Comment: Workmen's comp  Tobacco Use  . Smoking status: Former Smoker    Packs/day: 0.50    Years: 43.00    Pack years: 21.50    Types: Cigarettes    Quit date: 11/07/2019    Years since quitting: 0.0  . Smokeless tobacco: Never Used  Substance and Sexual Activity  . Alcohol use: Yes    Alcohol/week: 0.0 standard drinks    Comment: rarely  . Drug use: No  . Sexual activity: Not Currently  Other Topics Concern  . Not on file  Social History Narrative  . Not on file    Social Determinants of Health   Financial Resource Strain:   . Difficulty of Paying Living Expenses:   Food Insecurity:   . Worried About Charity fundraiser in the Last Year:   . Arboriculturist in the Last Year:   Transportation Needs:   . Film/video editor (Medical):   Marland Kitchen Lack of Transportation (Non-Medical):   Physical Activity:   . Days of Exercise per Week:   . Minutes of Exercise per Session:   Stress:   . Feeling of Stress :   Social Connections:   . Frequency of Communication with Friends and Family:   . Frequency of Social Gatherings with Friends and Family:   . Attends Religious Services:   . Active Member of Clubs or Organizations:   . Attends Archivist Meetings:   Marland Kitchen Marital Status:     Allergies  Allergen Reactions  . Prednisone     psychosis  . Heparin Other (See Comments)    Injections in the stomach causes huge knots, tenderness, and pain for weeks.  . Imdur [Isosorbide Nitrate]     Low blood pressure  . Morphine And Related Other (See Comments)    Patient states she gets morphine for severe chest pains and is okay when she takes it.  . Nitroglycerin Nausea Only    Patches--severe headache Drip--nausea and vomiting   . Zoloft [Sertraline Hcl]     Severe diarrhea     Current Outpatient Medications:  .  amLODipine (NORVASC) 2.5 MG tablet, TAKE 1 TABLET BY MOUTH EVERY DAY FOR HYPERTENSION, Disp: 30 tablet, Rfl: 0 .  aspirin EC 81 MG tablet, Take 1 tablet (81 mg total) by mouth every morning. For heart health, Disp: , Rfl:  .  atorvastatin (LIPITOR) 40 MG tablet, Take 1 tablet (40 mg total) by mouth daily at 6 PM. NEEDS APPOINTMENT FOR FUTURE REFILLS, Disp: 30 tablet, Rfl: 0 .  clopidogrel (PLAVIX) 75 MG tablet, Take 1 tablet (75 mg total) by mouth daily. NEEDS APPOINTMENT FOR FUTURE REFILLS OR 90-DAY SUPPLY, Disp: 30 tablet, Rfl: 0 .  levothyroxine (SYNTHROID, LEVOTHROID) 50 MCG tablet, TAKE 1 TABLET BY MOUTH DAILY BEFORE BREAKFAST.,  Disp: 30 tablet, Rfl: 0 .  metoprolol tartrate (LOPRESSOR) 25 MG tablet, Take 1 tablet (25 mg total) by mouth 2 (two) times daily. NEEDS APPOINTMENT FOR FUTURE REFILLS OR 90-DAY SUPPLY, Disp: 60 tablet, Rfl: 0 .  nitroGLYCERIN (NITROSTAT) 0.4 MG SL tablet, Place 1 tablet (0.4 mg total) under the tongue every 5 (five) minutes as needed for chest pain., Disp: 25 tablet, Rfl: 3 .  traZODone (DESYREL) 100 MG tablet, Take 200 mg by mouth  at bedtime., Disp: , Rfl:  .  albuterol (PROAIR HFA) 108 (90 BASE) MCG/ACT inhaler, USE 2 PUFFS EVERY 4 HOURS AS NEEDED FOR WHEEZING (SHORTNESS OF BREATH) (Patient not taking: Reported on 11/15/2019), Disp: 8.5 each, Rfl: 5 .  ARIPiprazole (ABILIFY) 10 MG tablet, Take 10 mg by mouth daily., Disp: , Rfl:  .  benzonatate (TESSALON) 100 MG capsule, Take 1-2 every 4-6 hours as needed for cough (Patient not taking: Reported on 11/15/2019), Disp: 90 capsule, Rfl: 4 .  clonazePAM (KLONOPIN) 0.5 MG tablet, Take 0.5 mg by mouth 2 (two) times daily. , Disp: , Rfl:  .  CVS NICOTINE POLACRILEX 4 MG lozenge, TAKE 1 LOZENGE BY MOUTH AS NEEDED FOR SMOKING CESSATION (Patient not taking: Reported on 11/15/2019), Disp: 135 lozenge, Rfl: 4 .  dextromethorphan (DELSYM) 30 MG/5ML liquid, 5ML  Every 4-6 hours per cough protocol Ok to use CV[employer] brand (Patient not taking: Reported on 09/17/2014), Disp: 89 mL, Rfl: 0 .  dicyclomine (BENTYL) 10 MG capsule, Take 10 mg by mouth every 6 (six) hours as needed (abdominal pain)., Disp: , Rfl:  .  esomeprazole (NEXIUM) 40 MG capsule, HOLD for TWO WEEKS then RESUMR    For acid reflux. 1 tablet daily. (Patient not taking: Reported on 11/15/2019), Disp: , Rfl:  .  furosemide (LASIX) 20 MG tablet, Take 1 tablet (20 mg total) by mouth daily. (Patient not taking: Reported on 11/15/2019), Disp: 30 tablet, Rfl: 2 .  hydrOXYzine (ATARAX/VISTARIL) 50 MG tablet, Take 50 mg by mouth every 8 (eight) hours as needed., Disp: , Rfl:  .  losartan (COZAAR) 50 MG tablet, TAKE 1 TABLET BY  MOUTH EVERY DAY FOR HYPERTENSION (Patient not taking: Reported on 11/15/2019), Disp: 30 tablet, Rfl: 2 .  ondansetron (ZOFRAN-ODT) 4 MG disintegrating tablet, Take 1 tablet (4 mg total) by mouth every 8 (eight) hours as needed for nausea or vomiting. (Patient not taking: Reported on 11/15/2019), Disp: 20 tablet, Rfl: 0   Review of Systems  Constitutional: Positive for chills, fatigue and fever.  HENT: Negative for congestion and sore throat.   Eyes: Negative for photophobia.  Respiratory: Negative for cough, shortness of breath and wheezing.   Cardiovascular: Negative for chest pain, palpitations and leg swelling.  Gastrointestinal: Negative for abdominal pain, blood in stool, constipation, diarrhea, nausea and vomiting.  Genitourinary: Negative for dysuria, flank pain and hematuria.  Musculoskeletal: Negative for back pain and myalgias.  Skin: Negative for rash.  Neurological: Negative for dizziness, weakness and headaches.  Hematological: Does not bruise/bleed easily.  Psychiatric/Behavioral: Negative for suicidal ideas.       Objective:   Physical Exam Constitutional:      General: She is not in acute distress.    Appearance: She is not diaphoretic.  HENT:     Head: Normocephalic and atraumatic.     Right Ear: External ear normal.     Left Ear: External ear normal.     Nose: Nose normal.     Mouth/Throat:     Pharynx: No oropharyngeal exudate.  Eyes:     General: No scleral icterus.    Conjunctiva/sclera: Conjunctivae normal.     Pupils: Pupils are equal, round, and reactive to light.  Cardiovascular:     Rate and Rhythm: Normal rate and regular rhythm.     Heart sounds: Normal heart sounds. No murmur. No friction rub. No gallop.   Pulmonary:     Effort: Pulmonary effort is normal. No respiratory distress.     Breath sounds: Normal  breath sounds. No wheezing or rales.  Abdominal:     General: Bowel sounds are normal. There is no distension.     Palpations: Abdomen is soft.      Tenderness: There is no abdominal tenderness. There is no rebound.  Musculoskeletal:        General: No tenderness. Normal range of motion.     Cervical back: Normal range of motion and neck supple.  Lymphadenopathy:     Cervical: No cervical adenopathy.  Skin:    General: Skin is warm and dry.     Coloration: Skin is not pale.     Findings: No erythema or rash.  Neurological:     General: No focal deficit present.     Mental Status: She is alert and oriented to person, place, and time.     Coordination: Coordination normal.  Psychiatric:        Mood and Affect: Mood normal.        Behavior: Behavior normal.        Thought Content: Thought content normal.        Judgment: Judgment normal.           Assessment & Plan:   FUO: We will get a CT of the chest with contrast we will get FUO labs which have been ordered.  I do wonder if this is just an unusual protracted reaction to her mRNA-based vaccine.  If her fevers are continuing to persist I may get an MRI of her left hip given her pain at this area.

## 2019-11-16 ENCOUNTER — Telehealth: Payer: Self-pay

## 2019-11-16 ENCOUNTER — Other Ambulatory Visit: Payer: Self-pay | Admitting: Infectious Disease

## 2019-11-16 DIAGNOSIS — R509 Fever, unspecified: Secondary | ICD-10-CM

## 2019-11-16 NOTE — Telephone Encounter (Signed)
PCP already did one a few weeks ago  but I have  ordered another one for her

## 2019-11-16 NOTE — Telephone Encounter (Signed)
Ok thank you 

## 2019-11-16 NOTE — Telephone Encounter (Signed)
Per referral coordinator Joycelyn Schmid the hospital is requesting a creatine level prior to schedule CT on 11/21/19. Routing to provider for orders and will let Lab know once orders received.  Eugenia Mcalpine

## 2019-11-19 LAB — PROTEIN ELECTROPHORESIS, SERUM
Albumin ELP: 4.1 g/dL (ref 3.8–4.8)
Alpha 1: 0.3 g/dL (ref 0.2–0.3)
Alpha 2: 1.1 g/dL — ABNORMAL HIGH (ref 0.5–0.9)
Beta 2: 0.4 g/dL (ref 0.2–0.5)
Beta Globulin: 0.5 g/dL (ref 0.4–0.6)
Gamma Globulin: 0.9 g/dL (ref 0.8–1.7)
Total Protein: 7.3 g/dL (ref 6.1–8.1)

## 2019-11-19 LAB — RPR: RPR Ser Ql: NONREACTIVE

## 2019-11-19 LAB — CREATININE WITH EST GFR
Creat: 0.81 mg/dL (ref 0.50–0.99)
GFR, Est African American: 90 mL/min/{1.73_m2} (ref >=60)
GFR, Est Non African American: 77 mL/min/{1.73_m2} (ref >=60)

## 2019-11-19 LAB — EPSTEIN-BARR VIRUS VCA ANTIBODY PANEL
EBV NA IgG: 136 U/mL — ABNORMAL HIGH
EBV VCA IgG: 381 U/mL — ABNORMAL HIGH
EBV VCA IgM: <36 U/mL

## 2019-11-19 LAB — CYTOMEGALOVIRUS ANTIBODY, IGG: Cytomegalovirus Ab-IgG: <0.6 U/mL

## 2019-11-19 LAB — QUANTIFERON-TB GOLD PLUS
Mitogen-NIL: >10 IU/mL
NIL: 0.02 IU/mL
QuantiFERON-TB Gold Plus: NEGATIVE
TB1-NIL: 0.08 IU/mL
TB2-NIL: 0.09 IU/mL

## 2019-11-19 LAB — ANA: Anti Nuclear Antibody (ANA): NEGATIVE

## 2019-11-19 LAB — CMV IGM: CMV IgM: <30 AU/mL

## 2019-11-19 LAB — CK: Total CK: 116 U/L (ref 29–143)

## 2019-11-19 LAB — FERRITIN: Ferritin: 45 ng/mL (ref 16–288)

## 2019-11-19 LAB — C-REACTIVE PROTEIN: CRP: 3.4 mg/L (ref <8.0)

## 2019-11-19 LAB — EPSTEIN-BARR VIRUS EARLY D ANTIGEN ANTIBODY, IGG: EBV EA IgG: <9 U/mL

## 2019-11-19 LAB — HEPATITIS PANEL, ACUTE
Hep A IgM: NONREACTIVE
Hep B C IgM: NONREACTIVE
Hepatitis B Surface Ag: NONREACTIVE
Hepatitis C Ab: NONREACTIVE
SIGNAL TO CUT-OFF: 0.01 (ref <1.00)

## 2019-11-19 LAB — TEST AUTHORIZATION

## 2019-11-19 LAB — CRYOGLOBULIN: Cryoglobulin, Qualitative Analysis: NOT DETECTED

## 2019-11-19 LAB — HIV ANTIBODY (ROUTINE TESTING W REFLEX): HIV 1&2 Ab, 4th Generation: NONREACTIVE

## 2019-11-19 LAB — SEDIMENTATION RATE: Sed Rate: 55 mm/h — ABNORMAL HIGH (ref 0–30)

## 2019-11-19 LAB — RHEUMATOID FACTOR: Rheumatoid fact SerPl-aCnc: <14 IU/mL (ref <14)

## 2019-11-19 LAB — ANGIOTENSIN CONVERTING ENZYME: Angiotensin-Converting Enzyme: 14 U/L (ref 9–67)

## 2019-11-20 ENCOUNTER — Other Ambulatory Visit: Payer: Self-pay

## 2019-11-20 DIAGNOSIS — R509 Fever, unspecified: Secondary | ICD-10-CM

## 2019-11-21 ENCOUNTER — Other Ambulatory Visit: Payer: Self-pay

## 2019-11-21 ENCOUNTER — Encounter (HOSPITAL_COMMUNITY): Payer: Self-pay

## 2019-11-21 ENCOUNTER — Ambulatory Visit (HOSPITAL_COMMUNITY)
Admission: RE | Admit: 2019-11-21 | Discharge: 2019-11-21 | Disposition: A | Discharge status: Home / Self Care | Payer: Medicare Other | Source: Ambulatory Visit | Attending: Infectious Disease | Admitting: Infectious Disease

## 2019-11-21 DIAGNOSIS — R509 Fever, unspecified: Secondary | ICD-10-CM | POA: Insufficient documentation

## 2019-11-21 DIAGNOSIS — R918 Other nonspecific abnormal finding of lung field: Secondary | ICD-10-CM | POA: Diagnosis not present

## 2019-11-21 MED ORDER — IOHEXOL 300 MG/ML  SOLN
75.0000 mL | Freq: Once | INTRAMUSCULAR | Status: AC | PRN
  Administered 2019-11-21: 75 mL via INTRAVENOUS

## 2019-11-21 MED ORDER — SODIUM CHLORIDE (PF) 0.9 % IJ SOLN
INTRAMUSCULAR | Status: AC
  Filled 2019-11-21: qty 50

## 2019-11-27 DIAGNOSIS — Z86010 Personal history of colonic polyps: Secondary | ICD-10-CM | POA: Diagnosis not present

## 2019-11-27 DIAGNOSIS — K219 Gastro-esophageal reflux disease without esophagitis: Secondary | ICD-10-CM | POA: Diagnosis not present

## 2019-11-27 DIAGNOSIS — K2270 Barrett's esophagus without dysplasia: Secondary | ICD-10-CM | POA: Diagnosis not present

## 2019-11-27 DIAGNOSIS — Z800 Family history of malignant neoplasm of digestive organs: Secondary | ICD-10-CM | POA: Diagnosis not present

## 2019-11-27 DIAGNOSIS — K52839 Microscopic colitis, unspecified: Secondary | ICD-10-CM | POA: Diagnosis not present

## 2019-12-13 DIAGNOSIS — K648 Other hemorrhoids: Secondary | ICD-10-CM | POA: Diagnosis not present

## 2019-12-13 DIAGNOSIS — D126 Benign neoplasm of colon, unspecified: Secondary | ICD-10-CM | POA: Diagnosis not present

## 2019-12-13 DIAGNOSIS — D123 Benign neoplasm of transverse colon: Secondary | ICD-10-CM | POA: Diagnosis not present

## 2019-12-13 DIAGNOSIS — Z86010 Personal history of colonic polyps: Secondary | ICD-10-CM | POA: Diagnosis not present

## 2019-12-13 DIAGNOSIS — Z800 Family history of malignant neoplasm of digestive organs: Secondary | ICD-10-CM | POA: Diagnosis not present

## 2019-12-13 DIAGNOSIS — K2270 Barrett's esophagus without dysplasia: Secondary | ICD-10-CM | POA: Diagnosis not present

## 2019-12-13 DIAGNOSIS — D124 Benign neoplasm of descending colon: Secondary | ICD-10-CM | POA: Diagnosis not present

## 2019-12-13 DIAGNOSIS — Z1211 Encounter for screening for malignant neoplasm of colon: Secondary | ICD-10-CM | POA: Diagnosis not present

## 2019-12-14 DIAGNOSIS — F314 Bipolar disorder, current episode depressed, severe, without psychotic features: Secondary | ICD-10-CM | POA: Diagnosis not present

## 2019-12-19 ENCOUNTER — Other Ambulatory Visit: Payer: Self-pay | Admitting: Infectious Disease

## 2019-12-19 ENCOUNTER — Encounter: Payer: Self-pay | Admitting: Infectious Disease

## 2019-12-19 ENCOUNTER — Ambulatory Visit (INDEPENDENT_AMBULATORY_CARE_PROVIDER_SITE_OTHER): Payer: Medicare Other | Admitting: Infectious Disease

## 2019-12-19 ENCOUNTER — Ambulatory Visit
Admission: RE | Admit: 2019-12-19 | Discharge: 2019-12-19 | Disposition: A | Discharge status: Home / Self Care | Payer: Medicare Other | Source: Ambulatory Visit | Attending: Infectious Disease | Admitting: Infectious Disease

## 2019-12-19 ENCOUNTER — Other Ambulatory Visit: Payer: Self-pay

## 2019-12-19 VITALS — BP 102/66 | HR 66 | Wt 197.0 lb

## 2019-12-19 DIAGNOSIS — R509 Fever, unspecified: Secondary | ICD-10-CM | POA: Diagnosis present

## 2019-12-19 DIAGNOSIS — M25552 Pain in left hip: Secondary | ICD-10-CM

## 2019-12-19 DIAGNOSIS — M47816 Spondylosis without myelopathy or radiculopathy, lumbar region: Secondary | ICD-10-CM | POA: Diagnosis not present

## 2019-12-19 DIAGNOSIS — R102 Pelvic and perineal pain: Secondary | ICD-10-CM | POA: Diagnosis not present

## 2019-12-19 DIAGNOSIS — M1612 Unilateral primary osteoarthritis, left hip: Secondary | ICD-10-CM | POA: Diagnosis not present

## 2019-12-19 NOTE — Progress Notes (Signed)
Subjective:   Chief complaint persistent hip pain   Patient ID: Kara Lamb, female    DOB: Feb 15, 1956, 64 y.o.   MRN: [id number]  HPI  Kara Lamb is a 64 year old Caucasian female with a past medical history significant for coronary artery disease, asthma, depression who had to mRNA-based Covid vaccines with the last one being in April.  Roughly 4 days after receiving the vaccine she developed fevers to 101 102 degrees chills and myalgias.  Ever since then she has suffered from symptoms of chills with subjective fevers largely in the low 100 degree to 99 degree range when measured.  She has been on amoxicillin without a clear target for this antibiotic without improvement she is also been on doxycycline again with no improvement in symptoms.  She has had lab work done at her PCPs office which showed light mild leukocytosis a sed rate that was 39 but listed as normal with a reference range of that lab.   She   had a CT of the abdomen and pelvis which was reportedly normal.  She is also had an ultrasound of the right upper quadrant which was normal.  She has been tested for Covid which has been negative.  Her only other symptom that is focal is some left hip pain that is been present for years.  Her lifetime travels been along the [place] with no foreign travel whatsoever no exposure to tuberculosis  We did further FUO labs here and a CT chest abdomen pelvis.  Everything was unremarkable.  She has had no more fever today she came to our clinic apparently.  She does continue to have left-sided hip pain that is been going on for more than 2 years and not been evaluated   Past Medical History:  Diagnosis Date  . Anxiety and depression   . Arthritis   . Asthma   . CAD (coronary artery disease)    a. s/p BMS to RCA and LCX 2008; b. ISR of RCA rx'd with Xience DES 12/09; c. Requires extensive sedation for cath; d. cath 11/10: nonobs; e.Cath 2/12: nonobs; f. 05/2011 Cath: nonobs; g.  02/2013 nonobs; h. 07/2013 lat isch on nuc-->cath: nonobstructive, EF 60-65%.  . Chronic chest pain   . Colitis   . Complication of anesthesia   . FUO (fever of unknown origin) 11/15/2019  . GERD (gastroesophageal reflux disease)   . H/O ETOH abuse   . H/O: suicide attempt    a. multiple  . Hyperlipidemia   . Hypertension   . Obesity   . Tobacco abuse     Past Surgical History:  Procedure Laterality Date  . CARPAL TUNNEL RELEASE    . CESAREAN SECTION     x 3  . COLON BIOPSY  08/12/2008  . LEFT HEART CATHETERIZATION WITH CORONARY ANGIOGRAM  06/04/2011   Procedure: LEFT HEART CATHETERIZATION WITH CORONARY ANGIOGRAM;  Surgeon: Burnell Blanks, MD;  Location: [facility] CATH LAB;  Service: Cardiovascular;;  . LEFT HEART CATHETERIZATION WITH CORONARY ANGIOGRAM N/A 02/22/2013   Procedure: LEFT HEART CATHETERIZATION WITH CORONARY ANGIOGRAM;  Surgeon: Ramond Dial, MD;  Location: [facility] CATH LAB;  Service: Cardiovascular;  Laterality: N/A;  . LEFT HEART CATHETERIZATION WITH CORONARY ANGIOGRAM N/A 07/25/2013   Procedure: LEFT HEART CATHETERIZATION WITH CORONARY ANGIOGRAM;  Surgeon: Sinclair Grooms, MD;  Location: [facility] CATH LAB;  Service: Cardiovascular;  Laterality: N/A;  . PTCA     stent  . [place]    Family  History  Problem Relation Age of Onset  . Coronary artery disease Mother   . Emphysema Mother   . Colon cancer Father   . Prostate cancer Father       Social History   Socioeconomic History  . Marital status: Divorced    Spouse name: Not on file  . Number of children: 3  . Years of education: Not on file  . Highest education level: Not on file  Occupational History    Comment: Workmen's comp  Tobacco Use  . Smoking status: Former Smoker    Packs/day: 0.50    Years: 43.00    Pack years: 21.50    Types: Cigarettes    Quit date: 11/07/2019    Years since quitting: 0.1  . Smokeless tobacco: Never Used  Substance and Sexual Activity  . Alcohol use: Yes     Alcohol/week: 0.0 standard drinks    Comment: rarely  . Drug use: No  . Sexual activity: Not Currently  Other Topics Concern  . Not on file  Social History Narrative  . Not on file   Social Determinants of Health   Financial Resource Strain:   . Difficulty of Paying Living Expenses:   Food Insecurity:   . Worried About Charity fundraiser in the Last Year:   . Arboriculturist in the Last Year:   Transportation Needs:   . Film/video editor (Medical):   Marland Kitchen Lack of Transportation (Non-Medical):   Physical Activity:   . Days of Exercise per Week:   . Minutes of Exercise per Session:   Stress:   . Feeling of Stress :   Social Connections:   . Frequency of Communication with Friends and Family:   . Frequency of Social Gatherings with Friends and Family:   . Attends Religious Services:   . Active Member of Clubs or Organizations:   . Attends Archivist Meetings:   Marland Kitchen Marital Status:     Allergies  Allergen Reactions  . Prednisone     psychosis  . Heparin Other (See Comments)    Injections in the stomach causes huge knots, tenderness, and pain for weeks.  . Imdur [Isosorbide Nitrate]     Low blood pressure  . Morphine And Related Other (See Comments)    Patient states she gets morphine for severe chest pains and is okay when she takes it.  . Nitroglycerin Nausea Only    Patches--severe headache Drip--nausea and vomiting   . Zoloft [Sertraline Hcl]     Severe diarrhea     Current Outpatient Medications:  .  albuterol (PROAIR HFA) 108 (90 BASE) MCG/ACT inhaler, USE 2 PUFFS EVERY 4 HOURS AS NEEDED FOR WHEEZING (SHORTNESS OF BREATH) (Patient not taking: Reported on 11/15/2019), Disp: 8.5 each, Rfl: 5 .  amLODipine (NORVASC) 2.5 MG tablet, TAKE 1 TABLET BY MOUTH EVERY DAY FOR HYPERTENSION, Disp: 30 tablet, Rfl: 0 .  ARIPiprazole (ABILIFY) 10 MG tablet, Take 10 mg by mouth daily., Disp: , Rfl:  .  aspirin EC 81 MG tablet, Take 1 tablet (81 mg total) by mouth every  morning. For heart health, Disp: , Rfl:  .  atorvastatin (LIPITOR) 40 MG tablet, Take 1 tablet (40 mg total) by mouth daily at 6 PM. NEEDS APPOINTMENT FOR FUTURE REFILLS, Disp: 30 tablet, Rfl: 0 .  benzonatate (TESSALON) 100 MG capsule, Take 1-2 every 4-6 hours as needed for cough (Patient not taking: Reported on 11/15/2019), Disp: 90 capsule, Rfl: 4 .  clonazePAM (  KLONOPIN) 0.5 MG tablet, Take 0.5 mg by mouth 2 (two) times daily. , Disp: , Rfl:  .  clopidogrel (PLAVIX) 75 MG tablet, Take 1 tablet (75 mg total) by mouth daily. NEEDS APPOINTMENT FOR FUTURE REFILLS OR 90-DAY SUPPLY, Disp: 30 tablet, Rfl: 0 .  CVS NICOTINE POLACRILEX 4 MG lozenge, TAKE 1 LOZENGE BY MOUTH AS NEEDED FOR SMOKING CESSATION (Patient not taking: Reported on 11/15/2019), Disp: 135 lozenge, Rfl: 4 .  dextromethorphan (DELSYM) 30 MG/5ML liquid, 5ML  Every 4-6 hours per cough protocol Ok to use [facility] brand (Patient not taking: Reported on 09/17/2014), Disp: 89 mL, Rfl: 0 .  dicyclomine (BENTYL) 10 MG capsule, Take 10 mg by mouth every 6 (six) hours as needed (abdominal pain)., Disp: , Rfl:  .  esomeprazole (NEXIUM) 40 MG capsule, HOLD for TWO WEEKS then RESUMR    For acid reflux. 1 tablet daily. (Patient not taking: Reported on 11/15/2019), Disp: , Rfl:  .  furosemide (LASIX) 20 MG tablet, Take 1 tablet (20 mg total) by mouth daily. (Patient not taking: Reported on 11/15/2019), Disp: 30 tablet, Rfl: 2 .  hydrOXYzine (ATARAX/VISTARIL) 50 MG tablet, Take 50 mg by mouth every 8 (eight) hours as needed., Disp: , Rfl:  .  levothyroxine (SYNTHROID, LEVOTHROID) 50 MCG tablet, TAKE 1 TABLET BY MOUTH DAILY BEFORE BREAKFAST., Disp: 30 tablet, Rfl: 0 .  losartan (COZAAR) 50 MG tablet, TAKE 1 TABLET BY MOUTH EVERY DAY FOR HYPERTENSION (Patient not taking: Reported on 11/15/2019), Disp: 30 tablet, Rfl: 2 .  metoprolol tartrate (LOPRESSOR) 25 MG tablet, Take 1 tablet (25 mg total) by mouth 2 (two) times daily. NEEDS APPOINTMENT FOR FUTURE REFILLS OR 90-DAY  SUPPLY, Disp: 60 tablet, Rfl: 0 .  nitroGLYCERIN (NITROSTAT) 0.4 MG SL tablet, Place 1 tablet (0.4 mg total) under the tongue every 5 (five) minutes as needed for chest pain., Disp: 25 tablet, Rfl: 3 .  ondansetron (ZOFRAN-ODT) 4 MG disintegrating tablet, Take 1 tablet (4 mg total) by mouth every 8 (eight) hours as needed for nausea or vomiting. (Patient not taking: Reported on 11/15/2019), Disp: 20 tablet, Rfl: 0 .  traZODone (DESYREL) 100 MG tablet, Take 200 mg by mouth at bedtime., Disp: , Rfl:    Review of Systems  Constitutional: Negative for chills, fatigue and fever.  HENT: Negative for congestion and sore throat.   Eyes: Negative for photophobia.  Respiratory: Negative for cough, shortness of breath and wheezing.   Cardiovascular: Negative for chest pain, palpitations and leg swelling.  Gastrointestinal: Negative for abdominal pain, blood in stool, constipation, diarrhea, nausea and vomiting.  Genitourinary: Negative for dysuria, flank pain and hematuria.  Musculoskeletal: Positive for arthralgias. Negative for back pain and myalgias.  Skin: Negative for rash.  Neurological: Negative for dizziness, weakness and headaches.  Hematological: Does not bruise/bleed easily.  Psychiatric/Behavioral: Negative for dysphoric mood, hallucinations and suicidal ideas. The patient is not hyperactive.        Objective:   Physical Exam Constitutional:      General: She is not in acute distress.    Appearance: She is not diaphoretic.  HENT:     Head: Normocephalic and atraumatic.     Right Ear: External ear normal.     Left Ear: External ear normal.     Nose: Nose normal.     Mouth/Throat:     Pharynx: No oropharyngeal exudate.  Eyes:     General: No scleral icterus.    Conjunctiva/sclera: Conjunctivae normal.     Pupils: Pupils are  equal, round, and reactive to light.  Cardiovascular:     Rate and Rhythm: Normal rate and regular rhythm.  Pulmonary:     Effort: Pulmonary effort is  normal. No respiratory distress.     Breath sounds: No wheezing.  Abdominal:     General: There is no distension.     Palpations: Abdomen is soft.  Musculoskeletal:     Cervical back: Normal range of motion and neck supple.     Right hip: No deformity, lacerations or crepitus. Normal range of motion. Normal strength.     Left hip: Tenderness present. No deformity, lacerations, bony tenderness or crepitus. Normal range of motion. Normal strength.  Lymphadenopathy:     Cervical: No cervical adenopathy.  Skin:    General: Skin is warm and dry.     Coloration: Skin is not pale.     Findings: No erythema or rash.  Neurological:     General: No focal deficit present.     Mental Status: She is alert and oriented to person, place, and time.     Coordination: Coordination normal.  Psychiatric:        Mood and Affect: Mood normal.        Behavior: Behavior normal.        Thought Content: Thought content normal.        Judgment: Judgment normal.           Assessment & Plan:   FUO: No known cause found maybe it was a post vaccine fever that persisted regardless it is resolved  Left hip pain with elevated inflammatory marker: Certainly hope this is not infectious but she does deserve further work-up for hip pain I ordered plain films we will try to get an MRI of the hip done at [facility].

## 2019-12-24 ENCOUNTER — Encounter: Payer: Self-pay | Admitting: Infectious Disease

## 2019-12-24 DIAGNOSIS — M1612 Unilateral primary osteoarthritis, left hip: Secondary | ICD-10-CM | POA: Diagnosis not present

## 2019-12-24 DIAGNOSIS — M76892 Other specified enthesopathies of left lower limb, excluding foot: Secondary | ICD-10-CM | POA: Diagnosis not present

## 2019-12-24 DIAGNOSIS — M25552 Pain in left hip: Secondary | ICD-10-CM | POA: Diagnosis not present

## 2019-12-26 ENCOUNTER — Telehealth: Payer: Self-pay

## 2019-12-26 NOTE — Telephone Encounter (Signed)
Patient called want results for an MRI of the Left Hip she had done at [facility]. We have received the f results for the MRI, which are in Dr. Tommy Medal basket. Patient has been advised Dr. Tommy Medal is out of the office right now and Dr. Baxter Flattery is covering for him and I can have her review the MRI. Patient stated she would rather have her pcp (Dr. Garwin Brothers) review the MRI. I have advised her she could fill out a records release with her pcp office and have them fax it to [facility] to get the results.  Kara Lamb [name]

## 2020-01-03 DIAGNOSIS — R319 Hematuria, unspecified: Secondary | ICD-10-CM | POA: Diagnosis not present

## 2020-01-03 DIAGNOSIS — R87618 Other abnormal cytological findings on specimens from cervix uteri: Secondary | ICD-10-CM | POA: Diagnosis not present

## 2020-01-03 DIAGNOSIS — B977 Papillomavirus as the cause of diseases classified elsewhere: Secondary | ICD-10-CM | POA: Diagnosis not present

## 2020-02-07 DIAGNOSIS — E119 Type 2 diabetes mellitus without complications: Secondary | ICD-10-CM | POA: Diagnosis not present

## 2020-02-07 DIAGNOSIS — F314 Bipolar disorder, current episode depressed, severe, without psychotic features: Secondary | ICD-10-CM | POA: Diagnosis not present

## 2020-02-20 DIAGNOSIS — M25552 Pain in left hip: Secondary | ICD-10-CM | POA: Diagnosis not present

## 2020-02-25 DIAGNOSIS — Z23 Encounter for immunization: Secondary | ICD-10-CM | POA: Diagnosis not present

## 2020-02-26 DIAGNOSIS — I10 Essential (primary) hypertension: Secondary | ICD-10-CM | POA: Diagnosis not present

## 2020-02-26 DIAGNOSIS — M549 Dorsalgia, unspecified: Secondary | ICD-10-CM | POA: Diagnosis not present

## 2020-02-26 DIAGNOSIS — E1165 Type 2 diabetes mellitus with hyperglycemia: Secondary | ICD-10-CM | POA: Diagnosis not present

## 2020-02-26 DIAGNOSIS — E119 Type 2 diabetes mellitus without complications: Secondary | ICD-10-CM | POA: Diagnosis not present

## 2020-02-26 DIAGNOSIS — E039 Hypothyroidism, unspecified: Secondary | ICD-10-CM | POA: Diagnosis not present

## 2020-03-05 DIAGNOSIS — R87810 Cervical high risk human papillomavirus (HPV) DNA test positive: Secondary | ICD-10-CM | POA: Diagnosis not present

## 2020-03-05 DIAGNOSIS — R87615 Unsatisfactory cytologic smear of cervix: Secondary | ICD-10-CM | POA: Diagnosis not present

## 2020-03-05 DIAGNOSIS — Z779 Other contact with and (suspected) exposures hazardous to health: Secondary | ICD-10-CM | POA: Diagnosis not present

## 2020-03-05 DIAGNOSIS — R319 Hematuria, unspecified: Secondary | ICD-10-CM | POA: Diagnosis not present

## 2020-03-05 DIAGNOSIS — Z6837 Body mass index (BMI) 37.0-37.9, adult: Secondary | ICD-10-CM | POA: Diagnosis not present

## 2020-03-05 DIAGNOSIS — N83209 Unspecified ovarian cyst, unspecified side: Secondary | ICD-10-CM | POA: Diagnosis not present

## 2020-03-24 DIAGNOSIS — R310 Gross hematuria: Secondary | ICD-10-CM | POA: Diagnosis not present

## 2020-03-24 DIAGNOSIS — N202 Calculus of kidney with calculus of ureter: Secondary | ICD-10-CM | POA: Diagnosis not present

## 2020-04-03 DIAGNOSIS — F314 Bipolar disorder, current episode depressed, severe, without psychotic features: Secondary | ICD-10-CM | POA: Diagnosis not present

## 2020-04-04 DIAGNOSIS — R310 Gross hematuria: Secondary | ICD-10-CM | POA: Diagnosis not present

## 2020-04-04 DIAGNOSIS — N200 Calculus of kidney: Secondary | ICD-10-CM | POA: Diagnosis not present

## 2020-04-08 DIAGNOSIS — Z8742 Personal history of other diseases of the female genital tract: Secondary | ICD-10-CM | POA: Diagnosis not present

## 2020-04-08 DIAGNOSIS — N870 Mild cervical dysplasia: Secondary | ICD-10-CM | POA: Diagnosis not present

## 2020-04-09 DIAGNOSIS — M25552 Pain in left hip: Secondary | ICD-10-CM | POA: Diagnosis not present

## 2020-04-09 DIAGNOSIS — M1612 Unilateral primary osteoarthritis, left hip: Secondary | ICD-10-CM | POA: Diagnosis not present

## 2020-04-10 DIAGNOSIS — N202 Calculus of kidney with calculus of ureter: Secondary | ICD-10-CM | POA: Diagnosis not present

## 2020-04-10 DIAGNOSIS — R310 Gross hematuria: Secondary | ICD-10-CM | POA: Diagnosis not present

## 2020-04-24 NOTE — H&P (Signed)
TOTAL HIP ADMISSION H&P  Patient is admitted for left total hip arthroplasty.  Subjective:  Chief Complaint: Left hip OA / pain  HPI: Kara Lamb, 64 y.o. female, has a history of pain and functional disability in the left hip(s) due to arthritis and patient has failed non-surgical conservative treatments for greater than 12 weeks to include NSAID's and/or analgesics, use of assistive devices and activity modification.  Onset of symptoms was gradual starting 9-10 years ago with gradually worsening course since that time.The patient noted no past surgery on the left hip(s).  Patient currently rates pain in the left hip at 8 out of 10 with activity. Patient has worsening of pain with activity and weight bearing, trendelenberg gait, pain that interfers with activities of daily living and pain with passive range of motion. Patient has evidence of periarticular osteophytes and joint space narrowing by imaging studies. This condition presents safety issues increasing the risk of falls.  There is no current active infection.  Risks, benefits and expectations were discussed with the patient.  Risks including but not limited to the risk of anesthesia, blood clots, nerve damage, blood vessel damage, failure of the prosthesis, infection and up to and including death.  Patient understand the risks, benefits and expectations and wishes to proceed with surgery.   D/C Plans:       Home  Post-op Meds:       No Rx given  Tranexamic Acid:      To be given - IV   Decadron:      Is to be given  FYI:     Plavix & ASA (on pre-op)  Norco  No Celebrex  Regular diet (not a DM diet)  DME:   Pt equipment arranged  PT:   HEP  [facility] [place] [place]    Patient Active Problem List   Diagnosis Date Noted  . FUO (fever of unknown origin) 11/15/2019  . Abnormal CT lung screening 11/20/2014  . Midsternal chest pain 09/17/2014  . Multiple drug overdose   . Suicide attempt by drug  ingestion ([place])   . Chest pain, negative MI 05/11/2014  . Tobacco use disorder 01/22/2014  . Severe obesity (BMI >= 40) ([place]) 01/22/2014  . Abnormal myocardial perfusion study 07/25/2013  . CAD (coronary artery disease)   . Chronic chest pain   . H/O: suicide attempt   . Obesity BMI 39   . GERD (gastroesophageal reflux disease)   . Asthmatic bronchitis , chronic ([place]) 07/05/2013  . Severe major depression without psychotic features ([place]) 03/01/2013  . GAD (generalized anxiety disorder) 03/01/2013  . Borderline behavior 06/16/2011  . Bipolar 1 disorder ([place]) 11/30/2010  . Dyslipidemia 08/21/2008  . HYPERTENSION, BENIGN 08/21/2008   Past Medical History:  Diagnosis Date  . Anxiety and depression   . Arthritis   . Asthma   . CAD (coronary artery disease)    a. s/p BMS to RCA and LCX 2008; b. ISR of RCA rx'd with Xience DES 12/09; c. Requires extensive sedation for cath; d. cath 11/10: nonobs; e.Cath 2/12: nonobs; f. 05/2011 Cath: nonobs; g. 02/2013 nonobs; h. 07/2013 lat isch on nuc-->cath: nonobstructive, EF 60-65%.  . Chronic chest pain   . Colitis   . Complication of anesthesia   . FUO (fever of unknown origin) 11/15/2019  . GERD (gastroesophageal reflux disease)   . H/O ETOH abuse   . H/O: suicide attempt    a. multiple  . Hyperlipidemia   .  Hypertension   . Obesity   . Tobacco abuse     Past Surgical History:  Procedure Laterality Date  . CARPAL TUNNEL RELEASE    . CESAREAN SECTION     x 3  . COLON BIOPSY  08/12/2008  . LEFT HEART CATHETERIZATION WITH CORONARY ANGIOGRAM  06/04/2011   Procedure: LEFT HEART CATHETERIZATION WITH CORONARY ANGIOGRAM;  Surgeon: Burnell Blanks, MD;  Location: [facility] CATH LAB;  Service: Cardiovascular;;  . LEFT HEART CATHETERIZATION WITH CORONARY ANGIOGRAM N/A 02/22/2013   Procedure: LEFT HEART CATHETERIZATION WITH CORONARY ANGIOGRAM;  Surgeon: Ramond Dial, MD;  Location: [facility] CATH LAB;  Service: Cardiovascular;  Laterality: N/A;  . LEFT  HEART CATHETERIZATION WITH CORONARY ANGIOGRAM N/A 07/25/2013   Procedure: LEFT HEART CATHETERIZATION WITH CORONARY ANGIOGRAM;  Surgeon: Sinclair Grooms, MD;  Location: [facility] CATH LAB;  Service: Cardiovascular;  Laterality: N/A;  . PTCA     stent  . TUBAL LIGATION  1982    No current facility-administered medications for this encounter.   Current Outpatient Medications  Medication Sig Dispense Refill Last Dose  . aspirin EC 81 MG tablet Take 1 tablet (81 mg total) by mouth every morning. For heart health     . atorvastatin (LIPITOR) 40 MG tablet Take 1 tablet (40 mg total) by mouth daily at 6 PM. NEEDS APPOINTMENT FOR FUTURE REFILLS 30 tablet 0   . clopidogrel (PLAVIX) 75 MG tablet Take 1 tablet (75 mg total) by mouth daily. NEEDS APPOINTMENT FOR FUTURE REFILLS OR 90-DAY SUPPLY 30 tablet 0   . [facility]S NICOTINE POLACRILEX 4 MG lozenge TAKE 1 LOZENGE BY MOUTH AS NEEDED FOR SMOKING CESSATION 135 lozenge 4   . diazepam (VALIUM) 5 MG tablet      . Dulaglutide (TRULICITY) 5.17 OH/6.0VP SOPN Weekly injection on Wednesdays     . glucose blood (ONETOUCH VERIO) test strip USE AS DIRECTED CHECK GLUCOSE 4 TIMES A DAY (Patient not taking: Reported on 12/19/2019)     . levothyroxine (SYNTHROID, LEVOTHROID) 50 MCG tablet TAKE 1 TABLET BY MOUTH DAILY BEFORE BREAKFAST. 30 tablet 0   . losartan (COZAAR) 50 MG tablet TAKE 1 TABLET BY MOUTH EVERY DAY FOR HYPERTENSION (Patient not taking: Reported on 11/15/2019) 30 tablet 2   . metoprolol tartrate (LOPRESSOR) 25 MG tablet Take 1 tablet (25 mg total) by mouth 2 (two) times daily. NEEDS APPOINTMENT FOR FUTURE REFILLS OR 90-DAY SUPPLY 60 tablet 0   . nitroGLYCERIN (NITROSTAT) 0.4 MG SL tablet Place 1 tablet (0.4 mg total) under the tongue every 5 (five) minutes as needed for chest pain. 25 tablet 3   . pantoprazole (PROTONIX) 40 MG tablet      . prazosin (MINIPRESS) 1 MG capsule take 2 capsules by oral route at bedtime     . ranolazine (RANEXA) 500 MG 12 hr tablet      .  topiramate (TOPAMAX) 50 MG tablet      . traZODone (DESYREL) 100 MG tablet Take 200 mg by mouth at bedtime. (Patient not taking: Reported on 12/19/2019)     . venlafaxine XR (EFFEXOR-XR) 150 MG 24 hr capsule       Allergies  Allergen Reactions  . Prednisone     psychosis  . Heparin Other (See Comments)    Injections in the stomach causes huge knots, tenderness, and pain for weeks.  . Imdur [Isosorbide Nitrate]     Low blood pressure  . Morphine And Related Other (See Comments)    Patient states she  gets morphine for severe chest pains and is okay when she takes it.  . Nitroglycerin Nausea Only    Patches--severe headache Drip--nausea and vomiting   . Zoloft [Sertraline Hcl]     Severe diarrhea    Social History   Tobacco Use  . Smoking status: Former Smoker    Packs/day: 0.50    Years: 43.00    Pack years: 21.50    Types: Cigarettes    Quit date: 11/07/2019    Years since quitting: 0.4  . Smokeless tobacco: Never Used  Substance Use Topics  . Alcohol use: Yes    Alcohol/week: 0.0 standard drinks    Comment: rarely    Family History  Problem Relation Age of Onset  . Coronary artery disease Mother   . Emphysema Mother   . Colon cancer Father   . Prostate cancer Father      Review of Systems  Constitutional: Negative.   HENT: Negative.   Eyes: Negative.   Respiratory: Negative.   Cardiovascular: Negative.   Gastrointestinal: Positive for heartburn.  Genitourinary: Negative.   Musculoskeletal: Positive for joint pain.  Skin: Negative.   Neurological: Negative.   Endo/Heme/Allergies: Negative.   Psychiatric/Behavioral: Positive for depression. The patient is nervous/anxious.     Objective:  Physical Exam Constitutional:      Appearance: She is well-developed.  HENT:     Head: Normocephalic.  Eyes:     Pupils: Pupils are equal, round, and reactive to light.  Neck:     Thyroid: No thyromegaly.     Vascular: No JVD.     Trachea: No tracheal deviation.   Cardiovascular:     Rate and Rhythm: Normal rate and regular rhythm.  Pulmonary:     Effort: Pulmonary effort is normal. No respiratory distress.     Breath sounds: Normal breath sounds. No wheezing.  Abdominal:     Palpations: Abdomen is soft.     Tenderness: There is no abdominal tenderness. There is no guarding.  Musculoskeletal:     Cervical back: Neck supple.     Left hip: Tenderness and bony tenderness present. No deformity. Decreased range of motion. Decreased strength.  Lymphadenopathy:     Cervical: No cervical adenopathy.  Skin:    General: Skin is warm and dry.  Neurological:     Mental Status: She is alert and oriented to person, place, and time.       Labs:  Estimated body mass index is 38.47 kg/m as calculated from the following:   Height as of 11/15/19: 5' (1.524 m).   Weight as of 12/19/19: 89.4 kg.   Imaging Review Plain radiographs demonstrate severe degenerative joint disease of the left hip(s). The bone quality appears to be good for age and reported activity level.      Assessment/Plan:  End stage arthritis, left hip  The patient history, physical examination, clinical judgement of the provider and imaging studies are consistent with end stage degenerative joint disease of the left hip(s) and total hip arthroplasty is deemed medically necessary. The treatment options including medical management, injection therapy, arthroscopy and arthroplasty were discussed at length. The risks and benefits of total hip arthroplasty were presented and reviewed. The risks due to aseptic loosening, infection, stiffness, dislocation/subluxation,  thromboembolic complications and other imponderables were discussed.  The patient acknowledged the explanation, agreed to proceed with the plan and consent was signed. Patient is being admitted for inpatient treatment for surgery, pain control, PT, OT, prophylactic antibiotics, VTE prophylaxis, progressive ambulation and  ADL's and  discharge planning.The patient is planning to be discharged home.

## 2020-05-13 NOTE — Patient Instructions (Addendum)
DUE TO COVID-19 ONLY ONE VISITOR IS ALLOWED TO COME WITH YOU AND STAY IN THE WAITING ROOM ONLY DURING PRE OP AND PROCEDURE DAY OF SURGERY. THE 1 VISITOR  MAY VISIT WITH YOU AFTER SURGERY IN YOUR PRIVATE ROOM DURING VISITING HOURS ONLY!  YOU NEED TO HAVE A COVID 19 TEST ON__12/3_____ @_2 :05______, THIS TEST MUST BE DONE BEFORE SURGERY,  COVID TESTING SITE [address] [place] [address], IT IS ON THE RIGHT GOING OUT [address] APPROXIMATELY  2 MINUTES PAST [employer] ON THE RIGHT. ONCE YOUR COVID TEST IS COMPLETED,  PLEASE BEGIN THE QUARANTINE INSTRUCTIONS AS OUTLINED IN YOUR HANDOUT.                [place]    Your procedure is scheduled on: 05/20/20   Report to [facility] Main  Entrance   Report to admitting at   6:10 AM     Call this number if you have problems the morning of surgery [place], NO CHEWING GUM [place].   No food after midnight.    You may have clear liquid until 5:30 AM.    At 5:00 AM drink pre surgery drink.   Nothing by mouth after 5:30 AM.   Take these medicines the morning of surgery with A SIP OF WATER: Lamotrigine, Levothyroxine, Metoprolol, Venlafaxine, Protonix   WHAT DO I DO ABOUT MY DIABETES MEDICATION? Marland Kitchen  . The day of surgery, do not take other diabetes injectables, including Byetta (exenatide), Bydureon (exenatide ER), Victoza (liraglutide), or Trulicity (dulaglutide). .                                  You may not have any metal on your body including hair pins and              piercings  Do not wear jewelry, make-up, lotions, powders or perfumes, deodorant             Do not wear nail polish on your fingernails.  Do not shave  48 hours prior to surgery.     Do not bring valuables to the hospital. [place].  Contacts, dentures or bridgework may not be worn into  surgery.     Special Instructions: N/A              Please read over the following fact sheets you were given: _____________________________________________________________________             [facility] - Preparing for Surgery Before surgery, you can play an important role.   Because skin is not sterile, your skin needs to be as free of germs as possible.   You can reduce the number of germs on your skin by washing with CHG (chlorahexidine gluconate) soap before surgery .  CHG is an antiseptic cleaner which kills germs and bonds with the skin to continue killing germs even after washing. Please DO NOT use if you have an allergy to CHG or antibacterial soaps.   If your skin becomes reddened/irritated stop using the CHG and inform your nurse when you arrive at Short Stay. Do not shave (including legs and underarms) for at least 48 hours prior to the first CHG shower.  Please follow these instructions carefully:  1.  Shower with CHG Soap the night before surgery and the  morning of Surgery.  2.  If you choose to wash your hair, wash your hair first as usual with your  normal  shampoo.  3.  After you shampoo, rinse your hair and body thoroughly to remove the  shampoo.                                        4.  Use CHG as you would any other liquid soap.  You can apply chg directly  to the skin and wash                       Gently with a scrungie or clean washcloth.  5.  Apply the CHG Soap to your body ONLY FROM THE NECK DOWN.   Do not use on face/ open                           Wound or open sores. Avoid contact with eyes, ears mouth and genitals (private parts).                       Wash face,  Genitals (private parts) with your normal soap.             6.  Wash thoroughly, paying special attention to the area where your surgery  will be performed.  7.  Thoroughly rinse your body with warm water from the neck down.  8.  DO NOT shower/wash with your normal soap after using and rinsing  off  the CHG Soap.             9.  Pat yourself dry with a clean towel.            10.  Wear clean pajamas.            11.  Place clean sheets on your bed the night of your first shower and do not  sleep with pets. Day of Surgery : Do not apply any lotions/deodorants the morning of surgery.  Please wear clean clothes to the hospital/surgery center.  FAILURE TO FOLLOW THESE INSTRUCTIONS MAY RESULT IN THE CANCELLATION OF YOUR SURGERY PATIENT SIGNATURE_________________________________  NURSE SIGNATURE__________________________________  ________________________________________________________________________   Adam Phenix  An incentive spirometer is a tool that can help keep your lungs clear and active. This tool measures how well you are filling your lungs with each breath. Taking long deep breaths may help reverse or decrease the chance of developing breathing (pulmonary) problems (especially infection) following:  A long period of time when you are unable to move or be active. BEFORE THE PROCEDURE   If the spirometer includes an indicator to show your best effort, your nurse or respiratory therapist will set it to a desired goal.  If possible, sit up straight or lean slightly forward. Try not to slouch.  Hold the incentive spirometer in an upright position. INSTRUCTIONS FOR USE  1. Sit on the edge of your bed if possible, or sit up as far as you can in bed or on a chair. 2. Hold the incentive spirometer in an upright position. 3. Breathe out normally. 4. Place the mouthpiece in your mouth and seal your lips tightly around it. 5. Breathe in slowly and  as deeply as possible, raising the piston or the ball toward the top of the column. 6. Hold your breath for 3-5 seconds or for as long as possible. Allow the piston or ball to fall to the bottom of the column. 7. Remove the mouthpiece from your mouth and breathe out normally. 8. Rest for a few seconds and repeat Steps 1 through  7 at least 10 times every 1-2 hours when you are awake. Take your time and take a few normal breaths between deep breaths. 9. The spirometer may include an indicator to show your best effort. Use the indicator as a goal to work toward during each repetition. 10. After each set of 10 deep breaths, practice coughing to be sure your lungs are clear. If you have an incision (the cut made at the time of surgery), support your incision when coughing by placing a pillow or rolled up towels firmly against it. Once you are able to get out of bed, walk around indoors and cough well. You may stop using the incentive spirometer when instructed by your caregiver.  RISKS AND COMPLICATIONS  Take your time so you do not get dizzy or light-headed.  If you are in pain, you may need to take or ask for pain medication before doing incentive spirometry. It is harder to take a deep breath if you are having pain. AFTER USE  Rest and breathe slowly and easily.  It can be helpful to keep track of a log of your progress. Your caregiver can provide you with a simple table to help with this. If you are using the spirometer at home, follow these instructions: [place] IF:   You are having difficultly using the spirometer.  You have trouble using the spirometer as often as instructed.  Your pain medication is not giving enough relief while using the spirometer.  You develop fever of 100.5 F (38.1 C) or higher. SEEK IMMEDIATE MEDICAL CARE IF:   You cough up bloody sputum that had not been present before.  You develop fever of 102 F (38.9 C) or greater.  You develop worsening pain at or near the incision site. MAKE SURE YOU:   Understand these instructions.  Will watch your condition.  Will get help right away if you are not doing well or get worse. Document Released: 10/11/2006 Document Revised: 08/23/2011 Document Reviewed: 12/12/2006 [facility] Patient Information 2014 [place],  [place].   ________________________________________________________________________

## 2020-05-14 ENCOUNTER — Encounter (HOSPITAL_COMMUNITY): Payer: Self-pay

## 2020-05-14 ENCOUNTER — Encounter (HOSPITAL_COMMUNITY)
Admission: RE | Admit: 2020-05-14 | Discharge: 2020-05-14 | Disposition: A | Discharge status: Home / Self Care | Payer: Medicare Other | Source: Ambulatory Visit | Attending: Orthopedic Surgery | Admitting: Orthopedic Surgery

## 2020-05-14 ENCOUNTER — Other Ambulatory Visit: Payer: Self-pay

## 2020-05-14 DIAGNOSIS — M1612 Unilateral primary osteoarthritis, left hip: Secondary | ICD-10-CM | POA: Diagnosis not present

## 2020-05-14 DIAGNOSIS — Z87891 Personal history of nicotine dependence: Secondary | ICD-10-CM | POA: Diagnosis not present

## 2020-05-14 DIAGNOSIS — I2510 Atherosclerotic heart disease of native coronary artery without angina pectoris: Secondary | ICD-10-CM | POA: Diagnosis not present

## 2020-05-14 DIAGNOSIS — Z7901 Long term (current) use of anticoagulants: Secondary | ICD-10-CM | POA: Insufficient documentation

## 2020-05-14 DIAGNOSIS — Z7902 Long term (current) use of antithrombotics/antiplatelets: Secondary | ICD-10-CM | POA: Diagnosis not present

## 2020-05-14 DIAGNOSIS — I10 Essential (primary) hypertension: Secondary | ICD-10-CM | POA: Diagnosis not present

## 2020-05-14 DIAGNOSIS — Z01812 Encounter for preprocedural laboratory examination: Secondary | ICD-10-CM | POA: Insufficient documentation

## 2020-05-14 DIAGNOSIS — K219 Gastro-esophageal reflux disease without esophagitis: Secondary | ICD-10-CM | POA: Insufficient documentation

## 2020-05-14 DIAGNOSIS — Z79899 Other long term (current) drug therapy: Secondary | ICD-10-CM | POA: Diagnosis not present

## 2020-05-14 DIAGNOSIS — Z7982 Long term (current) use of aspirin: Secondary | ICD-10-CM | POA: Insufficient documentation

## 2020-05-14 LAB — COMPREHENSIVE METABOLIC PANEL
ALT: 20 U/L (ref 0–44)
AST: 24 U/L (ref 15–41)
Albumin: 4 g/dL (ref 3.5–5.0)
Alkaline Phosphatase: 61 U/L (ref 38–126)
Anion gap: 9 (ref 5–15)
BUN: 14 mg/dL (ref 8–23)
CO2: 25 mmol/L (ref 22–32)
Calcium: 9.5 mg/dL (ref 8.9–10.3)
Chloride: 103 mmol/L (ref 98–111)
Creatinine, Ser: 0.74 mg/dL (ref 0.44–1.00)
GFR, Estimated: >60 mL/min (ref >60)
Glucose, Bld: 109 mg/dL — ABNORMAL HIGH (ref 70–99)
Potassium: 4.3 mmol/L (ref 3.5–5.1)
Sodium: 137 mmol/L (ref 135–145)
Total Bilirubin: 0.7 mg/dL (ref 0.3–1.2)
Total Protein: 7.3 g/dL (ref 6.5–8.1)

## 2020-05-14 LAB — HEMOGLOBIN A1C
Hgb A1c MFr Bld: 5.4 % (ref 4.8–5.6)
Mean Plasma Glucose: 108.28 mg/dL

## 2020-05-14 LAB — CBC
HCT: 39.8 % (ref 36.0–46.0)
Hemoglobin: 13 g/dL (ref 12.0–15.0)
MCH: 35.9 pg — ABNORMAL HIGH (ref 26.0–34.0)
MCHC: 32.7 g/dL (ref 30.0–36.0)
MCV: 109.9 fL — ABNORMAL HIGH (ref 80.0–100.0)
Platelets: 260 10*3/uL (ref 150–400)
RBC: 3.62 MIL/uL — ABNORMAL LOW (ref 3.87–5.11)
RDW: 12.8 % (ref 11.5–15.5)
WBC: 9.6 10*3/uL (ref 4.0–10.5)
nRBC: 0 % (ref 0.0–0.2)

## 2020-05-14 LAB — SURGICAL PCR SCREEN
MRSA, PCR: NEGATIVE
Staphylococcus aureus: NEGATIVE

## 2020-05-14 NOTE — Progress Notes (Signed)
COVID Vaccine Completed:yes Date COVID Vaccine completed:09/24/19-Booster  04/14/20 COVID vaccine manufacturer: [place]     PCP - Dr.S. Reesa Chew Cardiologist - Dr. Donia Guiles  Chest x-ray - 11/21/19-Epic EKG - records called for 05/14/20 from [facility]. Stress Test -2011  ECHO -  Cardiac Cath -2008,2009 with stent,2012,2014,2015,08/04/19  Pacemaker/ICD device last checked:na  Sleep Study - no CPAP -   Fasting Blood Sugar - doesn't test Checks Blood Sugar _____ times a day  Blood Thinner Instructions:plavix, ASA/ Dr. Otho Perl Aspirin Instructions:stop 5 days prior to DOS/ Alvan Dame Last Dose:05/13/20  Anesthesia review:   Patient denies shortness of breath, fever, cough and chest pain at PAT appointment yes   Patient verbalized understanding of instructions that were given to them at the PAT appointment. Patient was also instructed that they will need to review over the PAT instructions again at home before surgery. Yes Pt is a smoker and has been cutting back. She has no SOB climbing stairs, doing housework or with ADLs. Her diabetes has been well controlled for years with trulicity and doesn't test. A1cs have been good.

## 2020-05-14 NOTE — Progress Notes (Signed)
Pt reported that she had an EKG done at Dr. Sudie Grumbling office. I called Dr. Otho Perl and her PCP Dr. Latina Craver. No EKG was done. We will get one on DOS

## 2020-05-15 NOTE — Progress Notes (Addendum)
Anesthesia Chart Review   Case: [id number] Date/Time: 05/20/20 0825   Procedure: TOTAL HIP ARTHROPLASTY ANTERIOR APPROACH (Left Hip) - 70 mins   Anesthesia type: Spinal   Pre-op diagnosis: Left hip osteoarthritis   Location: [facility] ROOM 10 / [facility]   Surgeons: Paralee Cancel, MD      DISCUSSION:64 y.o. former smoker with h/o GERD, HTN, CAD (stent RCA/Cx 2008, restent RCA 2009), left hip OA scheduled for above procedure 05/20/20 with Dr. Paralee Cancel.   Pt last seen by cardiology 08/10/19. Per OV note pt denies cv sx, BP well controlled. 1 year follow up recommended.  Clearance faxed to ortho office per Dr. Otho Perl, requested to be faxed to PAT.  VS: BP (!) 154/66   Pulse 78   Temp 36.8 C (Oral)   Resp 18   Ht 5' 0.25" (1.53 m)   Wt 88 kg   SpO2 98%   BMI 37.57 kg/m   PROVIDERS: Garwin Brothers, MD is PCP   Mar Daring, MD is Cardiologist  LABS: Labs reviewed: Acceptable for surgery. (all labs ordered are listed, but only abnormal results are displayed)  Labs Reviewed  CBC - Abnormal; Notable for the following components:      Result Value   RBC 3.62 (*)    MCV 109.9 (*)    MCH 35.9 (*)    All other components within normal limits  COMPREHENSIVE METABOLIC PANEL - Abnormal; Notable for the following components:   Glucose, Bld 109 (*)    All other components within normal limits  SURGICAL PCR SCREEN  HEMOGLOBIN A1C  TYPE AND SCREEN     IMAGES:   EKG: To be done DOS  CV: Cardiac Cath 11/23/2018 Conclusions  Diagnostic Procedure Summary  Moderate RCA instent restenosis with normal iFR/.92  Normal left coronary system  I have reviewed the recent history and physical documentation. I personally  spent 20 minutes continuously monitoring the patient during the administration  of moderate sedation. Pre and post activities have been reviewed. I was  present for the entire procedure.  Diagnostic Procedure Recommendations  Medical therapy for CAD risk factor reduction.   Recommend work up for non-cardiac causes of symptoms.   Past Medical History:  Diagnosis Date  . Anxiety and depression   . Arthritis   . Asthma   . CAD (coronary artery disease)    a. s/p BMS to RCA and LCX 2008; b. ISR of RCA rx'd with Xience DES 12/09; c. Requires extensive sedation for cath; d. cath 11/10: nonobs; e.Cath 2/12: nonobs; f. 05/2011 Cath: nonobs; g. 02/2013 nonobs; h. 07/2013 lat isch on nuc-->cath: nonobstructive, EF 60-65%.  . Chronic chest pain   . Colitis    IBS  . Complication of anesthesia   . FUO (fever of unknown origin) 11/15/2019  . GERD (gastroesophageal reflux disease)   . H/O ETOH abuse   . H/O: suicide attempt    a. multiple  . Hyperlipidemia   . Hypertension   . Myocardial infarction ([place]) 2008  . Obesity   . Tobacco abuse     Past Surgical History:  Procedure Laterality Date  . CARDIAC CATHETERIZATION  2008   stents  . CARPAL TUNNEL RELEASE    . CESAREAN SECTION     x 3  . COLON BIOPSY  08/12/2008  . LEFT HEART CATHETERIZATION WITH CORONARY ANGIOGRAM  06/04/2011   Procedure: LEFT HEART CATHETERIZATION WITH CORONARY ANGIOGRAM;  Surgeon: Burnell Blanks, MD;  Location: [facility] CATH LAB;  Service: Cardiovascular;;  .  LEFT HEART CATHETERIZATION WITH CORONARY ANGIOGRAM N/A 02/22/2013   Procedure: LEFT HEART CATHETERIZATION WITH CORONARY ANGIOGRAM;  Surgeon: Ramond Dial, MD;  Location: [facility] CATH LAB;  Service: Cardiovascular;  Laterality: N/A;  . LEFT HEART CATHETERIZATION WITH CORONARY ANGIOGRAM N/A 07/25/2013   Procedure: LEFT HEART CATHETERIZATION WITH CORONARY ANGIOGRAM;  Surgeon: Sinclair Grooms, MD;  Location: [facility] CATH LAB;  Service: Cardiovascular;  Laterality: N/A;  . PTCA     stent  . TUBAL LIGATION  1982    MEDICATIONS: . aspirin EC 81 MG tablet  . atorvastatin (LIPITOR) 40 MG tablet  . clopidogrel (PLAVIX) 75 MG tablet  . [facility]S NICOTINE POLACRILEX 4 MG lozenge  . diazepam (VALIUM) 5 MG tablet  . Dulaglutide (TRULICITY) 6.23  JS/2.8BT SOPN  . lamoTRIgine (LAMICTAL) 200 MG tablet  . levothyroxine (SYNTHROID) 88 MCG tablet  . metoprolol tartrate (LOPRESSOR) 25 MG tablet  . Multiple Vitamin (MULTIVITAMIN WITH MINERALS) TABS tablet  . nitroGLYCERIN (NITROSTAT) 0.4 MG SL tablet  . olmesartan (BENICAR) 5 MG tablet  . pantoprazole (PROTONIX) 40 MG tablet  . prazosin (MINIPRESS) 1 MG capsule  . ranolazine (RANEXA) 500 MG 12 hr tablet  . topiramate (TOPAMAX) 50 MG tablet  . traMADol (ULTRAM) 50 MG tablet  . traZODone (DESYREL) 100 MG tablet  . venlafaxine XR (EFFEXOR-XR) 150 MG 24 hr capsule   No current facility-administered medications for this encounter.   Konrad Felix, PA-C [facility] Pre-Surgical Testing [phone]

## 2020-05-16 ENCOUNTER — Other Ambulatory Visit (HOSPITAL_COMMUNITY)
Admission: RE | Admit: 2020-05-16 | Discharge: 2020-05-16 | Disposition: A | Discharge status: Home / Self Care | Payer: Medicare Other | Source: Ambulatory Visit | Attending: Orthopedic Surgery | Admitting: Orthopedic Surgery

## 2020-05-16 DIAGNOSIS — Z01812 Encounter for preprocedural laboratory examination: Secondary | ICD-10-CM | POA: Diagnosis not present

## 2020-05-16 DIAGNOSIS — Z20822 Contact with and (suspected) exposure to covid-19: Secondary | ICD-10-CM | POA: Diagnosis not present

## 2020-05-16 LAB — SARS CORONAVIRUS 2 (TAT 6-24 HRS): SARS Coronavirus 2: NEGATIVE
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
## 2020-05-20 ENCOUNTER — Ambulatory Visit (HOSPITAL_COMMUNITY): Payer: Medicare Other

## 2020-05-20 ENCOUNTER — Ambulatory Visit (HOSPITAL_COMMUNITY): Payer: Medicare Other | Admitting: Physician Assistant

## 2020-05-20 ENCOUNTER — Encounter (HOSPITAL_COMMUNITY): Payer: Self-pay | Admitting: Orthopedic Surgery

## 2020-05-20 ENCOUNTER — Other Ambulatory Visit: Payer: Self-pay

## 2020-05-20 ENCOUNTER — Observation Stay (HOSPITAL_COMMUNITY): Payer: Medicare Other

## 2020-05-20 ENCOUNTER — Ambulatory Visit (HOSPITAL_COMMUNITY): Payer: Medicare Other | Admitting: Anesthesiology

## 2020-05-20 ENCOUNTER — Encounter (HOSPITAL_COMMUNITY)
Admission: RE | Disposition: A | Discharge status: Home Health | Payer: Self-pay | Source: Home / Self Care | Attending: Orthopedic Surgery

## 2020-05-20 ENCOUNTER — Inpatient Hospital Stay (HOSPITAL_COMMUNITY)
Admission: RE | Admit: 2020-05-20 | Discharge: 2020-05-23 | DRG: 470 | Disposition: A | Discharge status: Home Health | Payer: Medicare Other | Attending: Orthopedic Surgery | Admitting: Orthopedic Surgery

## 2020-05-20 DIAGNOSIS — E669 Obesity, unspecified: Secondary | ICD-10-CM | POA: Diagnosis present

## 2020-05-20 DIAGNOSIS — F319 Bipolar disorder, unspecified: Secondary | ICD-10-CM | POA: Diagnosis not present

## 2020-05-20 DIAGNOSIS — Z7984 Long term (current) use of oral hypoglycemic drugs: Secondary | ICD-10-CM | POA: Diagnosis not present

## 2020-05-20 DIAGNOSIS — Z96649 Presence of unspecified artificial hip joint: Secondary | ICD-10-CM

## 2020-05-20 DIAGNOSIS — Z825 Family history of asthma and other chronic lower respiratory diseases: Secondary | ICD-10-CM

## 2020-05-20 DIAGNOSIS — Z7902 Long term (current) use of antithrombotics/antiplatelets: Secondary | ICD-10-CM

## 2020-05-20 DIAGNOSIS — Z79890 Hormone replacement therapy (postmenopausal): Secondary | ICD-10-CM

## 2020-05-20 DIAGNOSIS — Z955 Presence of coronary angioplasty implant and graft: Secondary | ICD-10-CM

## 2020-05-20 DIAGNOSIS — F411 Generalized anxiety disorder: Secondary | ICD-10-CM | POA: Diagnosis present

## 2020-05-20 DIAGNOSIS — I252 Old myocardial infarction: Secondary | ICD-10-CM | POA: Diagnosis not present

## 2020-05-20 DIAGNOSIS — K219 Gastro-esophageal reflux disease without esophagitis: Secondary | ICD-10-CM | POA: Diagnosis present

## 2020-05-20 DIAGNOSIS — Z96642 Presence of left artificial hip joint: Secondary | ICD-10-CM | POA: Diagnosis not present

## 2020-05-20 DIAGNOSIS — J45909 Unspecified asthma, uncomplicated: Secondary | ICD-10-CM | POA: Diagnosis present

## 2020-05-20 DIAGNOSIS — E119 Type 2 diabetes mellitus without complications: Secondary | ICD-10-CM | POA: Diagnosis present

## 2020-05-20 DIAGNOSIS — I2510 Atherosclerotic heart disease of native coronary artery without angina pectoris: Secondary | ICD-10-CM | POA: Diagnosis present

## 2020-05-20 DIAGNOSIS — Z471 Aftercare following joint replacement surgery: Secondary | ICD-10-CM | POA: Diagnosis not present

## 2020-05-20 DIAGNOSIS — Z419 Encounter for procedure for purposes other than remedying health state, unspecified: Secondary | ICD-10-CM

## 2020-05-20 DIAGNOSIS — Z888 Allergy status to other drugs, medicaments and biological substances status: Secondary | ICD-10-CM

## 2020-05-20 DIAGNOSIS — Z87891 Personal history of nicotine dependence: Secondary | ICD-10-CM

## 2020-05-20 DIAGNOSIS — I10 Essential (primary) hypertension: Secondary | ICD-10-CM | POA: Diagnosis present

## 2020-05-20 DIAGNOSIS — E785 Hyperlipidemia, unspecified: Secondary | ICD-10-CM | POA: Diagnosis not present

## 2020-05-20 DIAGNOSIS — Z6837 Body mass index (BMI) 37.0-37.9, adult: Secondary | ICD-10-CM

## 2020-05-20 DIAGNOSIS — Z800 Family history of malignant neoplasm of digestive organs: Secondary | ICD-10-CM

## 2020-05-20 DIAGNOSIS — M1612 Unilateral primary osteoarthritis, left hip: Principal | ICD-10-CM | POA: Diagnosis present

## 2020-05-20 DIAGNOSIS — Z794 Long term (current) use of insulin: Secondary | ICD-10-CM | POA: Diagnosis not present

## 2020-05-20 DIAGNOSIS — Z7982 Long term (current) use of aspirin: Secondary | ICD-10-CM

## 2020-05-20 DIAGNOSIS — Z79899 Other long term (current) drug therapy: Secondary | ICD-10-CM

## 2020-05-20 DIAGNOSIS — Z8249 Family history of ischemic heart disease and other diseases of the circulatory system: Secondary | ICD-10-CM

## 2020-05-20 DIAGNOSIS — Z8042 Family history of malignant neoplasm of prostate: Secondary | ICD-10-CM

## 2020-05-20 HISTORY — PX: TOTAL HIP ARTHROPLASTY: SHX124

## 2020-05-20 LAB — ABO/RH: ABO/RH(D): A POS

## 2020-05-20 LAB — GLUCOSE, CAPILLARY: Glucose-Capillary: 115 mg/dL — ABNORMAL HIGH (ref 70–99)

## 2020-05-20 LAB — TYPE AND SCREEN
ABO/RH(D): A POS
Antibody Screen: NEGATIVE

## 2020-05-20 SURGERY — ARTHROPLASTY, HIP, TOTAL, ANTERIOR APPROACH
Anesthesia: General | Site: Hip | Laterality: Left

## 2020-05-20 MED ORDER — ONDANSETRON HCL 4 MG/2ML IJ SOLN: 4.0000 mg | Freq: Four times a day (QID) | INTRAMUSCULAR | Status: DC | PRN

## 2020-05-20 MED ORDER — CLOPIDOGREL BISULFATE 75 MG PO TABS
75.0000 mg | ORAL_TABLET | Freq: Every day | ORAL | Status: DC
  Administered 2020-05-21 – 2020-05-22 (×2): 75 mg via ORAL
  Filled 2020-05-20 (×2): qty 1

## 2020-05-20 MED ORDER — TOPIRAMATE 25 MG PO TABS
50.0000 mg | ORAL_TABLET | Freq: Every day | ORAL | Status: DC
  Administered 2020-05-20 – 2020-05-22 (×3): 50 mg via ORAL
  Filled 2020-05-20 (×3): qty 2

## 2020-05-20 MED ORDER — METOCLOPRAMIDE HCL 5 MG PO TABS: 5.0000 mg | ORAL_TABLET | Freq: Three times a day (TID) | ORAL | Status: DC | PRN

## 2020-05-20 MED ORDER — EPHEDRINE SULFATE-NACL 50-0.9 MG/10ML-% IV SOSY
PREFILLED_SYRINGE | INTRAVENOUS | Status: DC | PRN
  Administered 2020-05-20 (×8): 10 mg via INTRAVENOUS

## 2020-05-20 MED ORDER — MAGNESIUM CITRATE PO SOLN: 1.0000 | Freq: Once | ORAL | Status: DC | PRN

## 2020-05-20 MED ORDER — NITROGLYCERIN 0.4 MG SL SUBL: 0.4000 mg | SUBLINGUAL_TABLET | SUBLINGUAL | Status: DC | PRN

## 2020-05-20 MED ORDER — ORAL CARE MOUTH RINSE: 15.0000 mL | Freq: Once | OROMUCOSAL | Status: AC

## 2020-05-20 MED ORDER — 0.9 % SODIUM CHLORIDE (POUR BTL) OPTIME
TOPICAL | Status: DC | PRN
  Administered 2020-05-20: 1000 mL

## 2020-05-20 MED ORDER — CEFAZOLIN SODIUM-DEXTROSE 1-4 GM/50ML-% IV SOLN
1.0000 g | Freq: Four times a day (QID) | INTRAVENOUS | Status: AC
  Administered 2020-05-20 (×2): 1 g via INTRAVENOUS
  Filled 2020-05-20 (×2): qty 50

## 2020-05-20 MED ORDER — STERILE WATER FOR IRRIGATION IR SOLN
Status: DC | PRN
  Administered 2020-05-20: 2000 mL

## 2020-05-20 MED ORDER — PROPOFOL 10 MG/ML IV BOLUS
INTRAVENOUS | Status: DC | PRN
  Administered 2020-05-20: 160 mg via INTRAVENOUS

## 2020-05-20 MED ORDER — HYDROMORPHONE HCL 1 MG/ML IJ SOLN
0.5000 mg | INTRAMUSCULAR | Status: DC | PRN
  Administered 2020-05-20 (×3): 1 mg via INTRAVENOUS
  Administered 2020-05-21: 0.5 mg via INTRAVENOUS
  Administered 2020-05-21 (×2): 1 mg via INTRAVENOUS
  Filled 2020-05-20 (×5): qty 1

## 2020-05-20 MED ORDER — MENTHOL 3 MG MT LOZG
1.0000 | LOZENGE | OROMUCOSAL | Status: DC | PRN
  Filled 2020-05-20: qty 9

## 2020-05-20 MED ORDER — METOPROLOL TARTRATE 25 MG PO TABS
25.0000 mg | ORAL_TABLET | Freq: Every day | ORAL | Status: DC
  Administered 2020-05-21 – 2020-05-23 (×3): 25 mg via ORAL
  Filled 2020-05-20 (×3): qty 1

## 2020-05-20 MED ORDER — SUGAMMADEX SODIUM 500 MG/5ML IV SOLN
INTRAVENOUS | Status: AC
  Filled 2020-05-20: qty 5

## 2020-05-20 MED ORDER — BISACODYL 10 MG RE SUPP: 10.0000 mg | Freq: Every day | RECTAL | Status: DC | PRN

## 2020-05-20 MED ORDER — ALUM & MAG HYDROXIDE-SIMETH 200-200-20 MG/5ML PO SUSP: 15.0000 mL | ORAL | Status: DC | PRN

## 2020-05-20 MED ORDER — ASPIRIN 81 MG PO CHEW
81.0000 mg | CHEWABLE_TABLET | Freq: Every day | ORAL | Status: DC
  Administered 2020-05-21 – 2020-05-23 (×3): 81 mg via ORAL
  Filled 2020-05-20 (×3): qty 1

## 2020-05-20 MED ORDER — LIDOCAINE HCL (CARDIAC) PF 100 MG/5ML IV SOSY
PREFILLED_SYRINGE | INTRAVENOUS | Status: DC | PRN
  Administered 2020-05-20: 80 mg via INTRATRACHEAL

## 2020-05-20 MED ORDER — DIAZEPAM 5 MG PO TABS
5.0000 mg | ORAL_TABLET | Freq: Every day | ORAL | Status: DC | PRN
  Administered 2020-05-21: 5 mg via ORAL
  Filled 2020-05-20 (×2): qty 1

## 2020-05-20 MED ORDER — ONDANSETRON HCL 4 MG/2ML IJ SOLN
INTRAMUSCULAR | Status: DC | PRN
  Administered 2020-05-20: 4 mg via INTRAVENOUS

## 2020-05-20 MED ORDER — CEFAZOLIN SODIUM-DEXTROSE 2-4 GM/100ML-% IV SOLN
2.0000 g | INTRAVENOUS | Status: AC
  Administered 2020-05-20: 2 g via INTRAVENOUS
  Filled 2020-05-20: qty 100

## 2020-05-20 MED ORDER — FENTANYL CITRATE (PF) 100 MCG/2ML IJ SOLN
INTRAMUSCULAR | Status: AC
  Filled 2020-05-20: qty 2

## 2020-05-20 MED ORDER — METOCLOPRAMIDE HCL 5 MG/ML IJ SOLN: 5.0000 mg | Freq: Three times a day (TID) | INTRAMUSCULAR | Status: DC | PRN

## 2020-05-20 MED ORDER — PRAZOSIN HCL 1 MG PO CAPS
2.0000 mg | ORAL_CAPSULE | Freq: Every day | ORAL | Status: DC
  Administered 2020-05-20 – 2020-05-22 (×2): 2 mg via ORAL
  Filled 2020-05-20 (×3): qty 2

## 2020-05-20 MED ORDER — PHENYLEPHRINE HCL (PRESSORS) 10 MG/ML IV SOLN
INTRAVENOUS | Status: DC | PRN
  Administered 2020-05-20: 160 ug via INTRAVENOUS

## 2020-05-20 MED ORDER — DOCUSATE SODIUM 100 MG PO CAPS
100.0000 mg | ORAL_CAPSULE | Freq: Two times a day (BID) | ORAL | Status: DC
  Administered 2020-05-20 – 2020-05-23 (×6): 100 mg via ORAL
  Filled 2020-05-20 (×6): qty 1

## 2020-05-20 MED ORDER — FENTANYL CITRATE (PF) 100 MCG/2ML IJ SOLN
25.0000 ug | INTRAMUSCULAR | Status: DC | PRN
  Administered 2020-05-20 (×3): 50 ug via INTRAVENOUS

## 2020-05-20 MED ORDER — HYDROMORPHONE HCL 1 MG/ML IJ SOLN
INTRAMUSCULAR | Status: AC
  Filled 2020-05-20: qty 1

## 2020-05-20 MED ORDER — FENTANYL CITRATE (PF) 100 MCG/2ML IJ SOLN
INTRAMUSCULAR | Status: DC | PRN
Start: 2020-05-20 — End: 2020-05-20
  Administered 2020-05-20: 100 ug via INTRAVENOUS

## 2020-05-20 MED ORDER — METHOCARBAMOL 1000 MG/10ML IJ SOLN
500.0000 mg | Freq: Four times a day (QID) | INTRAVENOUS | Status: DC | PRN
  Filled 2020-05-20: qty 5

## 2020-05-20 MED ORDER — DEXAMETHASONE SODIUM PHOSPHATE 10 MG/ML IJ SOLN
10.0000 mg | Freq: Once | INTRAMUSCULAR | Status: AC
  Administered 2020-05-21: 10 mg via INTRAVENOUS
  Filled 2020-05-20: qty 1

## 2020-05-20 MED ORDER — IRBESARTAN 75 MG PO TABS
37.5000 mg | ORAL_TABLET | Freq: Every day | ORAL | Status: DC
  Administered 2020-05-20 – 2020-05-23 (×4): 37.5 mg via ORAL
  Filled 2020-05-20 (×4): qty 1

## 2020-05-20 MED ORDER — PHENOL 1.4 % MT LIQD: 1.0000 | OROMUCOSAL | Status: DC | PRN

## 2020-05-20 MED ORDER — HYDROCODONE-ACETAMINOPHEN 5-325 MG PO TABS: 1.0000 | ORAL_TABLET | ORAL | Status: DC | PRN

## 2020-05-20 MED ORDER — ACETAMINOPHEN 325 MG PO TABS: 325.0000 mg | ORAL_TABLET | Freq: Four times a day (QID) | ORAL | Status: DC | PRN

## 2020-05-20 MED ORDER — DIPHENHYDRAMINE HCL 12.5 MG/5ML PO ELIX: 12.5000 mg | ORAL_SOLUTION | ORAL | Status: DC | PRN

## 2020-05-20 MED ORDER — METHOCARBAMOL 500 MG PO TABS
500.0000 mg | ORAL_TABLET | Freq: Four times a day (QID) | ORAL | Status: DC | PRN
  Administered 2020-05-20 – 2020-05-22 (×6): 500 mg via ORAL
  Filled 2020-05-20 (×6): qty 1

## 2020-05-20 MED ORDER — SUGAMMADEX SODIUM 200 MG/2ML IV SOLN
INTRAVENOUS | Status: DC | PRN
  Administered 2020-05-20: 160 mg via INTRAVENOUS

## 2020-05-20 MED ORDER — FERROUS SULFATE 325 (65 FE) MG PO TABS
325.0000 mg | ORAL_TABLET | Freq: Three times a day (TID) | ORAL | Status: DC
  Administered 2020-05-21 – 2020-05-23 (×6): 325 mg via ORAL
  Filled 2020-05-20 (×6): qty 1

## 2020-05-20 MED ORDER — ROCURONIUM BROMIDE 100 MG/10ML IV SOLN
INTRAVENOUS | Status: DC | PRN
  Administered 2020-05-20: 60 mg via INTRAVENOUS
  Administered 2020-05-20: 10 mg via INTRAVENOUS

## 2020-05-20 MED ORDER — POLYETHYLENE GLYCOL 3350 17 G PO PACK
17.0000 g | PACK | Freq: Two times a day (BID) | ORAL | Status: DC
  Administered 2020-05-20 – 2020-05-23 (×2): 17 g via ORAL
  Filled 2020-05-20 (×6): qty 1

## 2020-05-20 MED ORDER — ONDANSETRON HCL 4 MG PO TABS: 4.0000 mg | ORAL_TABLET | Freq: Four times a day (QID) | ORAL | Status: DC | PRN

## 2020-05-20 MED ORDER — SODIUM CHLORIDE 0.9 % IV SOLN: INTRAVENOUS | Status: DC

## 2020-05-20 MED ORDER — LACTATED RINGERS IV SOLN: INTRAVENOUS | Status: DC

## 2020-05-20 MED ORDER — TRANEXAMIC ACID-NACL 1000-0.7 MG/100ML-% IV SOLN
1000.0000 mg | Freq: Once | INTRAVENOUS | Status: AC
  Administered 2020-05-20: 1000 mg via INTRAVENOUS
  Filled 2020-05-20: qty 100

## 2020-05-20 MED ORDER — VENLAFAXINE HCL ER 150 MG PO CP24
150.0000 mg | ORAL_CAPSULE | Freq: Every day | ORAL | Status: DC
  Administered 2020-05-21 – 2020-05-23 (×3): 150 mg via ORAL
  Filled 2020-05-20 (×3): qty 1

## 2020-05-20 MED ORDER — NICOTINE POLACRILEX 4 MG MT LOZG
4.0000 mg | LOZENGE | OROMUCOSAL | Status: DC | PRN
  Filled 2020-05-20: qty 1

## 2020-05-20 MED ORDER — TRANEXAMIC ACID-NACL 1000-0.7 MG/100ML-% IV SOLN
1000.0000 mg | INTRAVENOUS | Status: AC
  Administered 2020-05-20: 1000 mg via INTRAVENOUS
  Filled 2020-05-20: qty 100

## 2020-05-20 MED ORDER — TRAZODONE HCL 100 MG PO TABS
100.0000 mg | ORAL_TABLET | Freq: Every day | ORAL | Status: DC
  Administered 2020-05-20 – 2020-05-22 (×3): 100 mg via ORAL
  Filled 2020-05-20 (×3): qty 1

## 2020-05-20 MED ORDER — ATORVASTATIN CALCIUM 40 MG PO TABS
40.0000 mg | ORAL_TABLET | Freq: Every day | ORAL | Status: DC
  Administered 2020-05-20 – 2020-05-22 (×3): 40 mg via ORAL
  Filled 2020-05-20 (×3): qty 1

## 2020-05-20 MED ORDER — MIDAZOLAM HCL 2 MG/2ML IJ SOLN
INTRAMUSCULAR | Status: AC
  Filled 2020-05-20: qty 2

## 2020-05-20 MED ORDER — CHLORHEXIDINE GLUCONATE 0.12 % MT SOLN
15.0000 mL | Freq: Once | OROMUCOSAL | Status: AC
  Administered 2020-05-20: 15 mL via OROMUCOSAL

## 2020-05-20 MED ORDER — LEVOTHYROXINE SODIUM 88 MCG PO TABS
88.0000 ug | ORAL_TABLET | Freq: Every day | ORAL | Status: DC
  Administered 2020-05-21 – 2020-05-23 (×3): 88 ug via ORAL
  Filled 2020-05-20 (×3): qty 1

## 2020-05-20 MED ORDER — HYDROCODONE-ACETAMINOPHEN 7.5-325 MG PO TABS
1.0000 | ORAL_TABLET | ORAL | Status: DC | PRN
  Administered 2020-05-20 – 2020-05-21 (×4): 2 via ORAL
  Filled 2020-05-20 (×4): qty 2

## 2020-05-20 MED ORDER — ACETAMINOPHEN 500 MG PO TABS
1000.0000 mg | ORAL_TABLET | Freq: Once | ORAL | Status: AC
  Administered 2020-05-20: 1000 mg via ORAL
  Filled 2020-05-20: qty 2

## 2020-05-20 MED ORDER — RANOLAZINE ER 500 MG PO TB12
500.0000 mg | ORAL_TABLET | Freq: Two times a day (BID) | ORAL | Status: DC
  Administered 2020-05-20 – 2020-05-23 (×6): 500 mg via ORAL
  Filled 2020-05-20 (×6): qty 1

## 2020-05-20 MED ORDER — LAMOTRIGINE 100 MG PO TABS
200.0000 mg | ORAL_TABLET | Freq: Every day | ORAL | Status: DC
  Administered 2020-05-21 – 2020-05-23 (×3): 200 mg via ORAL
  Filled 2020-05-20 (×3): qty 2

## 2020-05-20 MED ORDER — PANTOPRAZOLE SODIUM 40 MG PO TBEC
40.0000 mg | DELAYED_RELEASE_TABLET | Freq: Every day | ORAL | Status: DC
  Administered 2020-05-21 – 2020-05-23 (×3): 40 mg via ORAL
  Filled 2020-05-20 (×3): qty 1

## 2020-05-20 MED ORDER — MIDAZOLAM HCL 5 MG/5ML IJ SOLN
INTRAMUSCULAR | Status: DC | PRN
  Administered 2020-05-20: 2 mg via INTRAVENOUS

## 2020-05-20 SURGICAL SUPPLY — 47 items
BAG DECANTER FOR FLEXI CONT (MISCELLANEOUS) IMPLANT
BAG ZIPLOCK 12X15 (MISCELLANEOUS) IMPLANT
BLADE SAG 18X100X1.27 (BLADE) ×3 IMPLANT
BLADE SURG SZ10 CARB STEEL (BLADE) ×6 IMPLANT
COVER PERINEAL POST (MISCELLANEOUS) ×3 IMPLANT
COVER SURGICAL LIGHT HANDLE (MISCELLANEOUS) ×3 IMPLANT
COVER WAND RF STERILE (DRAPES) IMPLANT
CUP ACET PINNACLE SECTR 50MM (Hips) ×1 IMPLANT
DERMABOND ADVANCED (GAUZE/BANDAGES/DRESSINGS) ×2
DERMABOND ADVANCED .7 DNX12 (GAUZE/BANDAGES/DRESSINGS) ×1 IMPLANT
DRAPE STERI IOBAN 125X83 (DRAPES) ×3 IMPLANT
DRAPE U-SHAPE 47X51 STRL (DRAPES) ×6 IMPLANT
DRESSING AQUACEL AG SP 3.5X10 (GAUZE/BANDAGES/DRESSINGS) ×1 IMPLANT
DRSG AQUACEL AG ADV 3.5X10 (GAUZE/BANDAGES/DRESSINGS) ×3 IMPLANT
DRSG AQUACEL AG SP 3.5X10 (GAUZE/BANDAGES/DRESSINGS) ×3
DURAPREP 26ML APPLICATOR (WOUND CARE) ×3 IMPLANT
ELECT REM PT RETURN 15FT ADLT (MISCELLANEOUS) ×3 IMPLANT
ELIMINATOR HOLE APEX DEPUY (Hips) ×3 IMPLANT
GLOVE BIO SURGEON STRL SZ 6 (GLOVE) ×6 IMPLANT
GLOVE BIOGEL PI IND STRL 6.5 (GLOVE) ×1 IMPLANT
GLOVE BIOGEL PI IND STRL 7.5 (GLOVE) ×1 IMPLANT
GLOVE BIOGEL PI IND STRL 8.5 (GLOVE) ×1 IMPLANT
GLOVE BIOGEL PI INDICATOR 6.5 (GLOVE) ×2
GLOVE BIOGEL PI INDICATOR 7.5 (GLOVE) ×2
GLOVE BIOGEL PI INDICATOR 8.5 (GLOVE) ×2
GLOVE ECLIPSE 8.0 STRL XLNG CF (GLOVE) ×6 IMPLANT
GLOVE ORTHO TXT STRL SZ7.5 (GLOVE) ×6 IMPLANT
GOWN STRL REUS W/TWL LRG LVL3 (GOWN DISPOSABLE) ×6 IMPLANT
GOWN STRL REUS W/TWL XL LVL3 (GOWN DISPOSABLE) ×3 IMPLANT
HEAD FEMORAL 32 CERAMIC (Hips) ×3 IMPLANT
HOLDER FOLEY CATH W/STRAP (MISCELLANEOUS) ×3 IMPLANT
KIT TURNOVER KIT A (KITS) IMPLANT
LINER ACET PNNCL PLUS4 NEUTRAL (Hips) ×1 IMPLANT
PACK ANTERIOR HIP CUSTOM (KITS) ×3 IMPLANT
PENCIL SMOKE EVACUATOR (MISCELLANEOUS) IMPLANT
PINNACLE PLUS 4 NEUTRAL (Hips) ×3 IMPLANT
PINNACLE SECTOR CUP 50MM (Hips) ×3 IMPLANT
SCREW 6.5MMX30MM (Screw) ×3 IMPLANT
STEM FEM ACTIS HIGH SZ1 (Stem) ×3 IMPLANT
SUT MNCRL AB 4-0 PS2 18 (SUTURE) ×3 IMPLANT
SUT STRATAFIX 0 PDS 27 VIOLET (SUTURE) ×3
SUT VIC AB 1 CT1 36 (SUTURE) ×9 IMPLANT
SUT VIC AB 2-0 CT1 27 (SUTURE) ×6
SUT VIC AB 2-0 CT1 TAPERPNT 27 (SUTURE) ×2 IMPLANT
SUTURE STRATFX 0 PDS 27 VIOLET (SUTURE) ×1 IMPLANT
TRAY FOLEY MTR SLVR 16FR STAT (SET/KITS/TRAYS/PACK) IMPLANT
WATER STERILE IRR 1000ML POUR (IV SOLUTION) ×3 IMPLANT

## 2020-05-20 NOTE — Transfer of Care (Signed)
Immediate Anesthesia Transfer of Care Note  Patient: Kara Lamb  Procedure(s) Performed: TOTAL HIP ARTHROPLASTY ANTERIOR APPROACH (Left Hip)  Patient Location: PACU  Anesthesia Type:General  Level of Consciousness: awake, sedated and patient cooperative  Airway & Oxygen Therapy: Patient Spontanous Breathing and Patient connected to face mask oxygen  Post-op Assessment: Report given to RN and Post -op Vital signs reviewed and stable  Post vital signs: stable  Last Vitals:  Vitals Value Taken Time  BP 113/70 05/20/20 1027  Temp    Pulse 65 05/20/20 1028  Resp 18 05/20/20 1028  SpO2 100 % 05/20/20 1028  Vitals shown include unvalidated device data.  Last Pain:  Vitals:   05/20/20 0645  TempSrc: Oral         Complications: No complications documented.

## 2020-05-20 NOTE — Discharge Instructions (Signed)

## 2020-05-20 NOTE — Op Note (Signed)
NAME:  Kara Lamb                ACCOUNT NO.: [id number]      MEDICAL RECORD NO.: [id number]      FACILITY:  [facility]      PHYSICIAN:  Mauri Pole  DATE OF BIRTH:  September 26, 1955     DATE OF PROCEDURE:  05/20/2020                                 OPERATIVE REPORT         PREOPERATIVE DIAGNOSIS: Left  hip osteoarthritis.      POSTOPERATIVE DIAGNOSIS:  Left hip osteoarthritis.      PROCEDURE:  Left total hip replacement through an anterior approach   utilizing DePuy THR system, component size 50 mm pinnacle cup, a size 32+4 neutral   Altrex liner, a size 1 Hi Actis stem with a 32+1 delta ceramic   ball.      SURGEON:  Pietro Cassis. Alvan Dame, M.D.      ASSISTANT:  Danae Orleans, PA-C     ANESTHESIA:  General.      SPECIMENS:  None.      COMPLICATIONS:  None.      BLOOD LOSS:  350 cc     DRAINS:  None.      INDICATION OF THE PROCEDURE:  Kara Lamb is a 64 y.o. female who had   presented to office for evaluation of left hip pain.  Radiographs revealed   progressive degenerative changes with bone-on-bone   articulation of the  hip joint, including subchondral cystic changes and osteophytes.  The patient had painful limited range of   motion significantly affecting their overall quality of life and function.  The patient was failing to    respond to conservative measures including medications and/or injections and activity modification and at this point was ready   to proceed with more definitive measures.  Consent was obtained for   benefit of pain relief.  Specific risks of infection, DVT, component   failure, dislocation, neurovascular injury, and need for revision surgery were reviewed in the office as well discussion of   the anterior versus posterior approach were reviewed.     PROCEDURE IN DETAIL:  The patient was brought to operative theater.   Once adequate anesthesia, preoperative antibiotics, 2 gm of Ancef, 1 gm of Tranexamic Acid, and 10 mg  of Decadron were administered, the patient was positioned supine on the [employer] table.  Once the patient was safely positioned with adequate padding of boney prominences we predraped out the hip, and used fluoroscopy to confirm orientation of the pelvis.      The left hip was then prepped and draped from proximal iliac crest to   mid thigh with a shower curtain technique.      Time-out was performed identifying the patient, planned procedure, and the appropriate extremity.     An incision was then made 2 cm lateral to the   anterior superior iliac spine extending over the orientation of the   tensor fascia lata muscle and sharp dissection was carried down to the   fascia of the muscle.      The fascia was then incised.  The muscle belly was identified and swept   laterally and retractor placed along the superior neck.  Following   cauterization of the circumflex vessels and removing some  pericapsular   fat, a second cobra retractor was placed on the inferior neck.  A T-capsulotomy was made along the line of the   superior neck to the trochanteric fossa, then extended proximally and   distally.  Tag sutures were placed and the retractors were then placed   intracapsular.  We then identified the trochanteric fossa and   orientation of my neck cut and then made a neck osteotomy with the femur on traction.  The femoral   head was removed without difficulty or complication.  Traction was let   off and retractors were placed posterior and anterior around the   acetabulum.      The labrum and foveal tissue were debrided.  I began reaming with a 43 mm   reamer and reamed up to 49 mm reamer with good bony bed preparation and a 50 mm  cup was chosen.  The final 50 mm Pinnacle cup was then impacted under fluoroscopy to confirm the depth of penetration and orientation with respect to   Abduction and forward flexion.  A screw was placed into the ilium followed by the hole eliminator.  The final    32+4 neutral Altrex liner was impacted with good visualized rim fit.  The cup was positioned anatomically within the acetabular portion of the pelvis.      At this point, the femur was rolled to 100 degrees.  Further capsule was   released off the inferior aspect of the femoral neck.  I then   released the superior capsule proximally.  With the leg in a neutral position the hook was placed laterally   along the femur under the vastus lateralis origin and elevated manually and then held in position using the hook attachment on the bed.  The leg was then extended and adducted with the leg rolled to 100   degrees of external rotation.  Retractors were placed along the medial calcar and posteriorly over the greater trochanter.  Once the proximal femur was fully   exposed, I used a box osteotome to set orientation.  I then began   broaching with the starting chili pepper broach and passed this by hand and then broached up to 1.  With the 1 broach in place I chose a high offset neck and did several trial reductions.  The offset was appropriate, leg lengths   appeared to be equal best matched with the +1 head ball trial confirmed radiographically.   Given these findings, I went ahead and dislocated the hip, repositioned all   retractors and positioned the right hip in the extended and abducted position.  The final 1 Hi Actis stem was   chosen and it was impacted down to the level of neck cut.  Based on this   and the trial reductions, a final 32+1 delta ceramic ball was chosen and   impacted onto a clean and dry trunnion, and the hip was reduced.  The   hip had been irrigated throughout the case again at this point.  I did   reapproximate the superior capsular leaflet to the anterior leaflet   using #1 Vicryl.  The fascia of the   tensor fascia lata muscle was then reapproximated using #1 Vicryl and #0 Stratafix sutures.  The   remaining wound was closed with 2-0 Vicryl and running 4-0 Monocryl.   The  hip was cleaned, dried, and dressed sterilely using Dermabond and   Aquacel dressing.  The patient was then brought   to  recovery room in stable condition tolerating the procedure well.    Danae Orleans, PA-C was present for the entirety of the case involved from   preoperative positioning, perioperative retractor management, general   facilitation of the case, as well as primary wound closure as assistant.            Pietro Cassis Alvan Dame, M.D.        05/20/2020 10:09 AM

## 2020-05-20 NOTE — Anesthesia Procedure Notes (Signed)
Procedure Name: Intubation Date/Time: 05/20/2020 8:42 AM Performed by: Pilar Grammes, CRNA Pre-anesthesia Checklist: Patient identified, Emergency Drugs available, Suction available, Patient being monitored and Timeout performed Patient Re-evaluated:Patient Re-evaluated prior to induction Oxygen Delivery Method: Circle system utilized Preoxygenation: Pre-oxygenation with 100% oxygen Induction Type: IV induction Ventilation: Mask ventilation without difficulty Laryngoscope Size: Miller and 2 Grade View: Grade II Tube type: Oral Tube size: 7.5 mm Number of attempts: 1 Airway Equipment and Method: Stylet Placement Confirmation: positive ETCO2,  ETT inserted through vocal cords under direct vision,  CO2 detector and breath sounds checked- equal and bilateral Secured at: 24 cm Tube secured with: Tape Dental Injury: Teeth and Oropharynx as per pre-operative assessment

## 2020-05-20 NOTE — Interval H&P Note (Signed)
History and Physical Interval Note:  05/20/2020 7:05 AM  Kara Lamb  has presented today for surgery, with the diagnosis of Left hip osteoarthritis.  The various methods of treatment have been discussed with the patient and family. After consideration of risks, benefits and other options for treatment, the patient has consented to  Procedure(s) with comments: TOTAL HIP ARTHROPLASTY ANTERIOR APPROACH (Left) - 70 mins as a surgical intervention.  The patient's history has been reviewed, patient examined, no change in status, stable for surgery.  I have reviewed the patient's chart and labs.  Questions were answered to the patient's satisfaction.     Mauri Pole

## 2020-05-20 NOTE — Progress Notes (Signed)
Orthopedic Tech Progress Note Patient Details:  Kara Lamb 10-07-1955 [id number]  Ortho Devices Ortho Device/Splint Location: applied overhead frame to bed Ortho Device/Splint Interventions: Ordered, Application   Post Interventions Patient Tolerated: Well Instructions Provided: Care of device   Braulio Bosch 05/20/2020, 1:57 PM

## 2020-05-20 NOTE — Care Plan (Signed)
Ortho Bundle Case Management Note  Patient Details  Name: Kara Lamb MRN: [id number] Date of Birth: Oct 04, 1955  L THA on 05-20-20 DCP:  Home with friend.  Lives in a senior apt with 0 ste. DME:  RW and 3-in-1 ordered through [place] PT:  HEP                   DME Arranged:  Walker rolling, 3-N-1 DME Agency:  [employer]  HH Arranged:  NA [place] Agency:  NA  Additional Comments: Please contact me with any questions of if this plan should need to change.  Marianne Sofia, RN,CCM [facility]  [phone] 05/20/2020, 3:53 PM

## 2020-05-20 NOTE — Evaluation (Signed)
Physical Therapy Evaluation Patient Details Name: Kara Lamb MRN: [id number] DOB: August 26, 1955 Today's Date: 05/20/2020   History of Present Illness  Patient is 64 y.o. female s/p Lt THA anterior approach on 05/20/20 with PMH significant for OA, MI, obesity, HLD, GERD, asthma, CAD, anxiety, depression.    Clinical Impression  Kara Lamb is a 64 y.o. female POD 0 s/p Lt THA. Patient reports independence with mobility at baseline with recent use of SPC due to pain. Patient is now limited by functional impairments (see PT problem list below) and requires mod assist for bed mobility and transfers with RW. Patient was limited greatly by pain but eager to attempt mobility. Pt complete bed<>BSC transfer with mod assist using RW. Cues required for sequencing and assist throughout to steady. Extra time needed for all mobility due to pain; at EOS patient instructed in exercise to facilitate circulation. Patient will benefit from continued skilled PT interventions to address impairments and progress towards PLOF. Acute PT will follow to progress mobility and stair training in preparation for safe discharge home.     Follow Up Recommendations Home health PT    Equipment Recommendations  Rolling walker with 5" wheels;3in1 (PT)    Recommendations for Other Services       Precautions / Restrictions Precautions Precautions: Fall Restrictions Weight Bearing Restrictions: No Other Position/Activity Restrictions: WBAT      Mobility  Bed Mobility Overal bed mobility: Needs Assistance Bed Mobility: Supine to Sit;Sit to Supine     Supine to sit: Mod assist;HOB elevated Sit to supine: Mod assist;HOB elevated   General bed mobility comments: pt requires extra time to complete. Mod assist for Lt LE mobility to move to EOB and for bil LE's with return to supine.    Transfers Overall transfer level: Needs assistance Equipment used: Rolling walker (2 wheeled) Transfers: Sit to/from [employer] Sit to Stand: Min assist;Mod assist Stand pivot transfers: Min assist;Mod assist       General transfer comment: VC's for safe technique/hand placement on RW for sit<>stand from EOB and BSC. Pt required cues for UE use to reduce Lt hip pain with weightbearing. Assist needed to manage RW position during stand step transfer to move Bed<>BSC.  Ambulation/Gait                Stairs            Wheelchair Mobility    Modified Rankin (Stroke Patients Only)       Balance Overall balance assessment: Needs assistance Sitting-balance support: Feet supported;Bilateral upper extremity supported Sitting balance-Leahy Scale: Fair     Standing balance support: During functional activity;Bilateral upper extremity supported Standing balance-Leahy Scale: Poor                               Pertinent Vitals/Pain Pain Assessment: Faces Faces Pain Scale: Hurts worst Pain Location: Lt hip Pain Descriptors / Indicators: Aching;Discomfort;Crying;Grimacing;Guarding Pain Intervention(s): Limited activity within patient's tolerance;Monitored during session;Repositioned;Patient requesting pain meds-RN notified;RN gave pain meds during session;Ice applied    Home Living Family/patient expects to be discharged to:: Private residence Living Arrangements: Alone   Type of Home: Apartment Home Access: Level entry     Home Layout: One level Home Equipment: Cane - single point      Prior Function Level of Independence: Independent;Independent with assistive device(s)         Comments: pt has been using SPC over last couple  weeks due to pain     Hand Dominance        Extremity/Trunk Assessment   Upper Extremity Assessment Upper Extremity Assessment: Overall WFL for tasks assessed (pt c/o of bil shoulder pain)    Lower Extremity Assessment Lower Extremity Assessment: Generalized weakness;LLE deficits/detail LLE Deficits / Details: very limited by  pain for testing LLE: Unable to fully assess due to pain LLE Sensation: WNL (some cutaneous numbness around incision) LLE Coordination: WNL    Cervical / Trunk Assessment Cervical / Trunk Assessment: Normal  Communication   Communication: No difficulties  Cognition Arousal/Alertness: Awake/alert Behavior During Therapy: WFL for tasks assessed/performed Overall Cognitive Status: Within Functional Limits for tasks assessed                                        General Comments      Exercises Total Joint Exercises Ankle Circles/Pumps: AROM;Both;15 reps;Supine   Assessment/Plan    PT Assessment Patient needs continued PT services  PT Problem List Decreased strength;Decreased range of motion;Decreased activity tolerance;Decreased balance;Decreased mobility;Decreased knowledge of use of DME;Decreased knowledge of precautions;Pain       PT Treatment Interventions DME instruction;Gait training;Stair training;Functional mobility training;Therapeutic activities;Therapeutic exercise;Balance training;Patient/family education    PT Goals (Current goals can be found in the Care Plan section)  Acute Rehab PT Goals Patient Stated Goal: stop hurting PT Goal Formulation: With patient Time For Goal Achievement: 05/27/20 Potential to Achieve Goals: Good    Frequency 7X/week   Barriers to discharge Decreased caregiver support pt lives alone and has expressed fear of being limited at home without help.    Co-evaluation               AM-PAC PT "6 Clicks" Mobility  Outcome Measure Help needed turning from your back to your side while in a flat bed without using bedrails?: A Lot Help needed moving from lying on your back to sitting on the side of a flat bed without using bedrails?: A Lot Help needed moving to and from a bed to a chair (including a wheelchair)?: A Lot Help needed standing up from a chair using your arms (e.g., wheelchair or bedside chair)?: A  Lot Help needed to walk in hospital room?: A Lot Help needed climbing 3-5 steps with a railing? : Total 6 Click Score: 11    End of Session Equipment Utilized During Treatment: Gait belt Activity Tolerance: Patient limited by pain Patient left: in bed;with call bell/phone within reach;with bed alarm set Nurse Communication: Mobility status PT Visit Diagnosis: Muscle weakness (generalized) (M62.81);Difficulty in walking, not elsewhere classified (R26.2);Pain Pain - Right/Left: Left Pain - part of body: Hip    Time: 1451-1546 PT Time Calculation (min) (ACUTE ONLY): 55 min   Charges:   PT Evaluation $PT Eval Low Complexity: 1 Low PT Treatments $Therapeutic Activity: 38-52 mins        Verner Mould, DPT Acute Rehabilitation Services  Office [phone] Pager [phone]  05/20/2020 5:52 PM

## 2020-05-20 NOTE — Anesthesia Postprocedure Evaluation (Signed)
Anesthesia Post Note  Patient: Kara Lamb  Procedure(s) Performed: TOTAL HIP ARTHROPLASTY ANTERIOR APPROACH (Left Hip)     Patient location during evaluation: PACU Anesthesia Type: General Level of consciousness: awake and alert, oriented and awake Pain management: pain level controlled Vital Signs Assessment: post-procedure vital signs reviewed and stable Respiratory status: spontaneous breathing, nonlabored ventilation and respiratory function stable Cardiovascular status: blood pressure returned to baseline and stable Postop Assessment: no apparent nausea or vomiting Anesthetic complications: no   No complications documented.  Last Vitals:  Vitals:   05/20/20 1300 05/20/20 1349  BP: 97/63 93/62  Pulse: 71 72  Resp: 10 16  Temp: (!) 36.4 C   SpO2: 98% 100%    Last Pain:  Vitals:   05/20/20 1417  TempSrc:   PainSc: [place]

## 2020-05-21 DIAGNOSIS — Z7982 Long term (current) use of aspirin: Secondary | ICD-10-CM | POA: Diagnosis not present

## 2020-05-21 DIAGNOSIS — M1612 Unilateral primary osteoarthritis, left hip: Secondary | ICD-10-CM | POA: Diagnosis present

## 2020-05-21 DIAGNOSIS — I252 Old myocardial infarction: Secondary | ICD-10-CM | POA: Diagnosis not present

## 2020-05-21 DIAGNOSIS — J45909 Unspecified asthma, uncomplicated: Secondary | ICD-10-CM | POA: Diagnosis present

## 2020-05-21 DIAGNOSIS — Z79899 Other long term (current) drug therapy: Secondary | ICD-10-CM | POA: Diagnosis not present

## 2020-05-21 DIAGNOSIS — Z7902 Long term (current) use of antithrombotics/antiplatelets: Secondary | ICD-10-CM | POA: Diagnosis not present

## 2020-05-21 DIAGNOSIS — Z8042 Family history of malignant neoplasm of prostate: Secondary | ICD-10-CM | POA: Diagnosis not present

## 2020-05-21 DIAGNOSIS — I2510 Atherosclerotic heart disease of native coronary artery without angina pectoris: Secondary | ICD-10-CM | POA: Diagnosis present

## 2020-05-21 DIAGNOSIS — E119 Type 2 diabetes mellitus without complications: Secondary | ICD-10-CM | POA: Diagnosis present

## 2020-05-21 DIAGNOSIS — E785 Hyperlipidemia, unspecified: Secondary | ICD-10-CM | POA: Diagnosis present

## 2020-05-21 DIAGNOSIS — E669 Obesity, unspecified: Secondary | ICD-10-CM | POA: Diagnosis present

## 2020-05-21 DIAGNOSIS — Z800 Family history of malignant neoplasm of digestive organs: Secondary | ICD-10-CM | POA: Diagnosis not present

## 2020-05-21 DIAGNOSIS — Z8249 Family history of ischemic heart disease and other diseases of the circulatory system: Secondary | ICD-10-CM | POA: Diagnosis not present

## 2020-05-21 DIAGNOSIS — Z955 Presence of coronary angioplasty implant and graft: Secondary | ICD-10-CM | POA: Diagnosis not present

## 2020-05-21 DIAGNOSIS — K219 Gastro-esophageal reflux disease without esophagitis: Secondary | ICD-10-CM | POA: Diagnosis present

## 2020-05-21 DIAGNOSIS — F411 Generalized anxiety disorder: Secondary | ICD-10-CM | POA: Diagnosis present

## 2020-05-21 DIAGNOSIS — Z825 Family history of asthma and other chronic lower respiratory diseases: Secondary | ICD-10-CM | POA: Diagnosis not present

## 2020-05-21 DIAGNOSIS — Z87891 Personal history of nicotine dependence: Secondary | ICD-10-CM | POA: Diagnosis not present

## 2020-05-21 DIAGNOSIS — F319 Bipolar disorder, unspecified: Secondary | ICD-10-CM | POA: Diagnosis present

## 2020-05-21 DIAGNOSIS — Z6837 Body mass index (BMI) 37.0-37.9, adult: Secondary | ICD-10-CM | POA: Diagnosis not present

## 2020-05-21 DIAGNOSIS — Z79890 Hormone replacement therapy (postmenopausal): Secondary | ICD-10-CM | POA: Diagnosis not present

## 2020-05-21 DIAGNOSIS — I10 Essential (primary) hypertension: Secondary | ICD-10-CM | POA: Diagnosis present

## 2020-05-21 DIAGNOSIS — Z888 Allergy status to other drugs, medicaments and biological substances status: Secondary | ICD-10-CM | POA: Diagnosis not present

## 2020-05-21 LAB — CBC
HCT: 33.2 % — ABNORMAL LOW (ref 36.0–46.0)
Hemoglobin: 10.5 g/dL — ABNORMAL LOW (ref 12.0–15.0)
MCH: 36.2 pg — ABNORMAL HIGH (ref 26.0–34.0)
MCHC: 31.6 g/dL (ref 30.0–36.0)
MCV: 114.5 fL — ABNORMAL HIGH (ref 80.0–100.0)
Platelets: 227 10*3/uL (ref 150–400)
RBC: 2.9 MIL/uL — ABNORMAL LOW (ref 3.87–5.11)
RDW: 13.3 % (ref 11.5–15.5)
WBC: 12.4 10*3/uL — ABNORMAL HIGH (ref 4.0–10.5)
nRBC: 0 % (ref 0.0–0.2)

## 2020-05-21 LAB — BASIC METABOLIC PANEL
Anion gap: 7 (ref 5–15)
BUN: 9 mg/dL (ref 8–23)
CO2: 29 mmol/L (ref 22–32)
Calcium: 8.4 mg/dL — ABNORMAL LOW (ref 8.9–10.3)
Chloride: 102 mmol/L (ref 98–111)
Creatinine, Ser: 0.71 mg/dL (ref 0.44–1.00)
GFR, Estimated: >60 mL/min (ref >60)
Glucose, Bld: 153 mg/dL — ABNORMAL HIGH (ref 70–99)
Potassium: 3.7 mmol/L (ref 3.5–5.1)
Sodium: 138 mmol/L (ref 135–145)

## 2020-05-21 MED ORDER — FERROUS SULFATE 325 (65 FE) MG PO TABS: 325.0000 mg | ORAL_TABLET | Freq: Three times a day (TID) | ORAL | 0 refills | Status: DC

## 2020-05-21 MED ORDER — METHOCARBAMOL 500 MG PO TABS: 500.0000 mg | ORAL_TABLET | Freq: Four times a day (QID) | ORAL | 0 refills | Status: DC | PRN

## 2020-05-21 MED ORDER — POLYETHYLENE GLYCOL 3350 17 G PO PACK: 17.0000 g | PACK | Freq: Two times a day (BID) | ORAL | 0 refills | Status: DC

## 2020-05-21 MED ORDER — ACETAMINOPHEN 500 MG PO TABS
1000.0000 mg | ORAL_TABLET | Freq: Three times a day (TID) | ORAL | Status: DC
  Administered 2020-05-21 – 2020-05-23 (×5): 1000 mg via ORAL
  Filled 2020-05-21 (×5): qty 2

## 2020-05-21 MED ORDER — OXYCODONE HCL 5 MG PO TABS
5.0000 mg | ORAL_TABLET | ORAL | Status: DC | PRN
  Administered 2020-05-21 – 2020-05-23 (×5): 10 mg via ORAL
  Filled 2020-05-21 (×5): qty 2

## 2020-05-21 MED ORDER — DOCUSATE SODIUM 100 MG PO CAPS: 100.0000 mg | ORAL_CAPSULE | Freq: Two times a day (BID) | ORAL | 0 refills | Status: DC
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
## 2020-05-21 NOTE — TOC Transition Note (Signed)
Transition of Care [facility]) - CM/SW Discharge Note   Patient Details  Name: Kara Lamb MRN: [id number] Date of Birth: Sep 15, 1955  [facility] CM/SW Contact:  Lia Hopping, [place] Phone Number: 05/21/2020, 12:10 PM   Clinical Narrative:    Ortho bundle plan of care. See CM note.  Lou Miner and 3 in1 delivered to the patient bedside by [employer]   Final next level of care: Home/Self Care (HEP)     Patient Goals and CMS Choice        Discharge Placement                       Discharge Plan and Services                DME Arranged: Walker rolling, 3-N-1 DME Agency: [employer]       HH Arranged: NA HH Agency: NA        Social Determinants of Health (SDOH) Interventions     Readmission Risk Interventions No flowsheet data found.

## 2020-05-21 NOTE — Progress Notes (Signed)
Physical Therapy Treatment Patient Details Name: Kara Lamb MRN: [id number] DOB: 09/03/1955 Today's Date: 05/21/2020    History of Present Illness Patient is 64 y.o. female s/p Lt THA anterior approach on 05/20/20 with PMH significant for OA, MI, obesity, HLD, GERD, asthma, CAD, anxiety, depression.    PT Comments    Patient progressing slowly with acute PT and remains greatly limited by Lt hip pain. She requires min-mod assist to complete bed mobility, transfers, and gait. She requires significantly extra time to complete all mobility and a lot of encouragement. Patient was able to ambulate ~12' around foot of bed with min assist for walker management and safety. Patient lives alone and is not safe to return home at current level of function. Updated discharge recommendations are listed below for SNF follow up as she will require physical assist for all mobility and ADL's. Acute PT will continue to progress as able.    Follow Up Recommendations  SNF     Equipment Recommendations  Rolling walker with 5" wheels;3in1 (PT)    Recommendations for Other Services       Precautions / Restrictions Precautions Precautions: Fall Restrictions Weight Bearing Restrictions: No Other Position/Activity Restrictions: WBAT    Mobility  Bed Mobility Overal bed mobility: Needs Assistance Bed Mobility: Supine to Sit     Supine to sit: HOB elevated;Min assist Sit to supine: Min assist;Mod assist   General bed mobility comments: pt required cues to scoot hips towards EOB and to slide Lt LE off EOB in order to rise. pt unable to raise trunk from flat bed and HOB elevated. Min assist required to sit upright and pivot to sit straight up. Min-Mod assist for Lt LE mobility to raise it into bed.  Transfers Overall transfer level: Needs assistance Equipment used: Rolling walker (2 wheeled) Transfers: Sit to/from Stand Sit to Stand: Min assist         General transfer comment: pt continues to  require min assist to initiate and complete power up.   Ambulation/Gait Ambulation/Gait assistance: Min assist Gait Distance (Feet): 12 Feet Assistive device: Rolling walker (2 wheeled) Gait Pattern/deviations: Step-to pattern;Decreased stride length;Decreased weight shift to left;Antalgic Gait velocity: decr   General Gait Details: cues for management of walker and assist to position for gait. pt ambulated in room around her bed. Pt has great difficulty advancing Lt LE forward and is limited by pain. She required frequent stops to rest until pain subsided.    Stairs             Wheelchair Mobility    Modified Rankin (Stroke Patients Only)       Balance Overall balance assessment: Needs assistance Sitting-balance support: Feet supported;Bilateral upper extremity supported Sitting balance-Leahy Scale: Fair     Standing balance support: During functional activity;Bilateral upper extremity supported Standing balance-Leahy Scale: Poor                              Cognition Arousal/Alertness: Awake/alert Behavior During Therapy: WFL for tasks assessed/performed Overall Cognitive Status: Within Functional Limits for tasks assessed                                        Exercises Total Joint Exercises Ankle Circles/Pumps: AROM;Both;15 reps;Supine    General Comments        Pertinent Vitals/Pain Pain Assessment: Faces Faces Pain  Scale: Hurts whole lot Pain Location: Lt hip Pain Descriptors / Indicators: Aching;Discomfort;Guarding;Grimacing Pain Intervention(s): Limited activity within patient's tolerance;Repositioned;Monitored during session;Premedicated before session    Home Living                      Prior Function            PT Goals (current goals can now be found in the care plan section) Acute Rehab PT Goals Patient Stated Goal: stop hurting PT Goal Formulation: With patient Time For Goal Achievement:  05/27/20 Potential to Achieve Goals: Good Progress towards PT goals: Progressing toward goals    Frequency    7X/week      PT Plan Discharge plan needs to be updated    Co-evaluation              AM-PAC PT "6 Clicks" Mobility   Outcome Measure  Help needed turning from your back to your side while in a flat bed without using bedrails?: A Little Help needed moving from lying on your back to sitting on the side of a flat bed without using bedrails?: A Lot Help needed moving to and from a bed to a chair (including a wheelchair)?: A Little Help needed standing up from a chair using your arms (e.g., wheelchair or bedside chair)?: A Little Help needed to walk in hospital room?: A Little Help needed climbing 3-5 steps with a railing? : Total 6 Click Score: 15    End of Session Equipment Utilized During Treatment: Gait belt Activity Tolerance: Patient limited by pain Patient left: in bed;with call bell/phone within reach;with bed alarm set Nurse Communication: Mobility status PT Visit Diagnosis: Muscle weakness (generalized) (M62.81);Difficulty in walking, not elsewhere classified (R26.2);Pain Pain - Right/Left: Left Pain - part of body: Hip     Time: 4944-9675 PT Time Calculation (min) (ACUTE ONLY): 29 min  Charges:  $Gait Training: 8-22 mins $Therapeutic Activity: 8-22 mins                     Verner Mould, DPT Acute Rehabilitation Services  Office [phone] Pager [phone]  05/21/2020 5:04 PM

## 2020-05-21 NOTE — Progress Notes (Signed)
Physical Therapy Treatment Patient Details Name: Kara Lamb MRN: [id number] DOB: 1956/06/05 Today's Date: 05/21/2020    History of Present Illness Patient is 64 y.o. female s/p Lt THA anterior approach on 05/20/20 with PMH significant for OA, MI, obesity, HLD, GERD, asthma, CAD, anxiety, depression.    PT Comments    Patient seen for AM session to progress mobility as able. She remains limited by Lt hip pain despite premedicating and ice. Patient requires substantial time to complete mobility due to pain but was able to use gait belt to mobilize Lt LE off EOB. Pt complete 2x sit<>stand EOB with min assist for safety and took several small steps with RW prior to c/o dizziness and chair provided for pt to sit. She will continue to benefit from skilled PT interventions to address impairments and progress as able.    Follow Up Recommendations  Home health PT     Equipment Recommendations  Rolling walker with 5" wheels;3in1 (PT)    Recommendations for Other Services       Precautions / Restrictions Precautions Precautions: Fall Restrictions Weight Bearing Restrictions: No Other Position/Activity Restrictions: WBAT    Mobility  Bed Mobility Overal bed mobility: Needs Assistance Bed Mobility: Supine to Sit     Supine to sit: HOB elevated;Min guard;Min assist     General bed mobility comments: cues for use of gait belt to assist with Lt LE mobility off EOB, pt taking significantly increased time to manage pain. light assist intermittently to sit up but pt very determined to complete as independently as possible.   Transfers Overall transfer level: Needs assistance Equipment used: Rolling walker (2 wheeled) Transfers: Sit to/from Stand Sit to Stand: Min assist         General transfer comment: cues for technique/hand placement. pt relying on Rt LE or power up due to Lt hip pain. Min assist for power up and for safety in standing. pt performed 2x from EOB due to some  dizziness which resolved with sitting.   Ambulation/Gait Ambulation/Gait assistance: Min assist Gait Distance (Feet): 5 Feet Assistive device: Rolling walker (2 wheeled) Gait Pattern/deviations: Step-to pattern;Decreased stride length;Decreased weight shift to left;Antalgic Gait velocity: decr   General Gait Details: cues or safe proximity to RW and to flex knee vs hip on Lt LE to manage pain with LE advancement. pt sliding Lt foot forward to move. Distance limited by dizziness and pain.    Stairs             Wheelchair Mobility    Modified Rankin (Stroke Patients Only)       Balance Overall balance assessment: Needs assistance Sitting-balance support: Feet supported;Bilateral upper extremity supported Sitting balance-Leahy Scale: Fair     Standing balance support: During functional activity;Bilateral upper extremity supported Standing balance-Leahy Scale: Poor                              Cognition Arousal/Alertness: Awake/alert Behavior During Therapy: WFL for tasks assessed/performed Overall Cognitive Status: Within Functional Limits for tasks assessed                                        Exercises      General Comments        Pertinent Vitals/Pain Pain Assessment: Faces Faces Pain Scale: Hurts even more Pain Location: Lt hip Pain Descriptors /  Indicators: Aching;Discomfort;Guarding;Grimacing Pain Intervention(s): Limited activity within patient's tolerance;Monitored during session;Premedicated before session;Repositioned    Home Living                      Prior Function            PT Goals (current goals can now be found in the care plan section) Acute Rehab PT Goals Patient Stated Goal: stop hurting PT Goal Formulation: With patient Time For Goal Achievement: 05/27/20 Potential to Achieve Goals: Good Progress towards PT goals: Progressing toward goals    Frequency    7X/week      PT Plan  Current plan remains appropriate    Co-evaluation              AM-PAC PT "6 Clicks" Mobility   Outcome Measure  Help needed turning from your back to your side while in a flat bed without using bedrails?: A Little Help needed moving from lying on your back to sitting on the side of a flat bed without using bedrails?: A Little Help needed moving to and from a bed to a chair (including a wheelchair)?: A Little Help needed standing up from a chair using your arms (e.g., wheelchair or bedside chair)?: A Little Help needed to walk in hospital room?: A Little Help needed climbing 3-5 steps with a railing? : Total 6 Click Score: 16    End of Session Equipment Utilized During Treatment: Gait belt Activity Tolerance: Patient limited by pain Patient left: in bed;with call bell/phone within reach;with bed alarm set Nurse Communication: Mobility status PT Visit Diagnosis: Muscle weakness (generalized) (M62.81);Difficulty in walking, not elsewhere classified (R26.2);Pain Pain - Right/Left: Left Pain - part of body: Hip     Time: 5784-6962 PT Time Calculation (min) (ACUTE ONLY): 29 min  Charges:  $Gait Training: 8-22 mins $Therapeutic Activity: 8-22 mins                     Verner Mould, DPT Acute Rehabilitation Services  Office [phone] Pager [phone]  05/21/2020 2:34 PM

## 2020-05-22 ENCOUNTER — Encounter (HOSPITAL_COMMUNITY): Payer: Self-pay | Admitting: Orthopedic Surgery

## 2020-05-22 LAB — BASIC METABOLIC PANEL WITH GFR
Anion gap: 9 (ref 5–15)
BUN: 14 mg/dL (ref 8–23)
CO2: 26 mmol/L (ref 22–32)
Calcium: 9.1 mg/dL (ref 8.9–10.3)
Chloride: 102 mmol/L (ref 98–111)
Creatinine, Ser: 0.74 mg/dL (ref 0.44–1.00)
GFR, Estimated: >60 mL/min
Glucose, Bld: 137 mg/dL — ABNORMAL HIGH (ref 70–99)
Potassium: 4.4 mmol/L (ref 3.5–5.1)
Sodium: 137 mmol/L (ref 135–145)

## 2020-05-22 LAB — CBC
HCT: 33.1 % — ABNORMAL LOW (ref 36.0–46.0)
Hemoglobin: 10.4 g/dL — ABNORMAL LOW (ref 12.0–15.0)
MCH: 36 pg — ABNORMAL HIGH (ref 26.0–34.0)
MCHC: 31.4 g/dL (ref 30.0–36.0)
MCV: 114.5 fL — ABNORMAL HIGH (ref 80.0–100.0)
Platelets: 224 10*3/uL (ref 150–400)
RBC: 2.89 MIL/uL — ABNORMAL LOW (ref 3.87–5.11)
RDW: 13.2 % (ref 11.5–15.5)
WBC: 16.6 10*3/uL — ABNORMAL HIGH (ref 4.0–10.5)
nRBC: 0 % (ref 0.0–0.2)

## 2020-05-22 NOTE — Progress Notes (Signed)
Physical Therapy Treatment Patient Details Name: Kara Lamb MRN: [id number] DOB: September 29, 1955 Today's Date: 05/22/2020    History of Present Illness Patient is 64 y.o. female s/p Lt THA anterior approach on 05/20/20 with PMH significant for OA, MI, obesity, HLD, GERD, asthma, CAD, anxiety, depression.    PT Comments    Patient much improved today with reduced pain. She required min guard for sit<>stand transfers from recliner and toilet. Patient able to begin advancing Lt LE by lifting it off the floor, standing dorsiflexion and knee flexion performed to facilitate pt's ability to raise LE off ground. Pt ambulated greater distance in hallway and progressed to step through pattern. Initiated seated LE exercises for HEP with minimal discomfort reported by pt. Acute PT will continue to progress pt as able.   Follow Up Recommendations  SNF     Equipment Recommendations  Rolling walker with 5" wheels;3in1 (PT)    Recommendations for Other Services       Precautions / Restrictions Precautions Precautions: Fall Restrictions Weight Bearing Restrictions: No Other Position/Activity Restrictions: WBAT    Mobility  Bed Mobility               General bed mobility comments: pt recieved OOB in recliner  Transfers Overall transfer level: Needs assistance Equipment used: Rolling walker (2 wheeled) Transfers: Sit to/from Stand Sit to Stand: Min guard Stand pivot transfers: Min assist;Min guard       General transfer comment: Min guard for power up from recliner. pt with good carryover for hand placement. no unsteadiness with rising. min guard/assist to rise from toilet and pt using grab bar.  Ambulation/Gait Ambulation/Gait assistance: Min assist Gait Distance (Feet): 50 Feet Assistive device: Rolling walker (2 wheeled) Gait Pattern/deviations: Step-to pattern;Decreased stride length;Decreased weight shift to left;Antalgic Gait velocity: decr   General Gait Details: pt  with improved tolerance to WB on lt LE and after ambulting to bathroom pt began lifting Lt LE off the floor to advance it rather than scooting/sliding her foot forward.   Stairs             Wheelchair Mobility    Modified Rankin (Stroke Patients Only)       Balance Overall balance assessment: Needs assistance Sitting-balance support: Feet supported;Bilateral upper extremity supported Sitting balance-Leahy Scale: Fair     Standing balance support: During functional activity;Bilateral upper extremity supported Standing balance-Leahy Scale: Fair                              Cognition Arousal/Alertness: Awake/alert Behavior During Therapy: WFL for tasks assessed/performed Overall Cognitive Status: Within Functional Limits for tasks assessed                                        Exercises Total Joint Exercises Ankle Circles/Pumps: AROM;Both;15 reps;Supine Quad Sets: AROM;Seated;Both;5 reps Heel Slides: AROM;Seated;AAROM;Both;5 reps Long Arc Quad: AROM;Both;5 reps;Seated    General Comments        Pertinent Vitals/Pain Pain Assessment: Faces Faces Pain Scale: Hurts little more Pain Location: Lt hip Pain Descriptors / Indicators: Aching;Discomfort;Guarding;Grimacing Pain Intervention(s): Limited activity within patient's tolerance;Monitored during session;Repositioned;Ice applied    Home Living                      Prior Function  PT Goals (current goals can now be found in the care plan section) Acute Rehab PT Goals Patient Stated Goal: stop hurting PT Goal Formulation: With patient Time For Goal Achievement: 05/27/20 Potential to Achieve Goals: Good Progress towards PT goals: Progressing toward goals    Frequency    7X/week      PT Plan Current plan remains appropriate    Co-evaluation              AM-PAC PT "6 Clicks" Mobility   Outcome Measure  Help needed turning from your back to  your side while in a flat bed without using bedrails?: A Little Help needed moving from lying on your back to sitting on the side of a flat bed without using bedrails?: A Lot Help needed moving to and from a bed to a chair (including a wheelchair)?: A Little Help needed standing up from a chair using your arms (e.g., wheelchair or bedside chair)?: A Little Help needed to walk in hospital room?: A Little Help needed climbing 3-5 steps with a railing? : A Lot 6 Click Score: 16    End of Session Equipment Utilized During Treatment: Gait belt Activity Tolerance: Patient limited by pain Patient left: in bed;with call bell/phone within reach;with bed alarm set Nurse Communication: Mobility status PT Visit Diagnosis: Muscle weakness (generalized) (M62.81);Difficulty in walking, not elsewhere classified (R26.2);Pain Pain - Right/Left: Left Pain - part of body: Hip     Time: 1022-1110 PT Time Calculation (min) (ACUTE ONLY): 48 min  Charges:  $Gait Training: 23-37 mins $Therapeutic Exercise: 8-22 mins                     Verner Mould, DPT Acute Rehabilitation Services  Office [phone] Pager [phone]  05/22/2020 1:20 PM

## 2020-05-22 NOTE — Progress Notes (Addendum)
Subjective: 2 Days Post-Op Procedure(s) (LRB): TOTAL HIP ARTHROPLASTY ANTERIOR APPROACH (Left) Patient reports pain as mild.   Patient seen in rounds with Dr. Alvan Dame. Patient is well, and has had no acute complaints or problems other than discomfort in the left hip. She notes her pain is much improved compared to yesterday. She is resting in the recliner on exam today. Voiding without difficulty. Positive flatus. Denies CP, SHOB, N/V.  We will continue therapy today.   Objective: Vital signs in last 24 hours: Temp:  [98 F (36.7 C)-98.1 F (36.7 C)] 98.1 F (36.7 C) (12/09 0510) Pulse Rate:  [63-73] 63 (12/09 0510) Resp:  [16] 16 (12/09 0510) BP: (90-92)/(44-51) 92/46 (12/09 0510) SpO2:  [88 %-95 %] 95 % (12/09 0510)  Intake/Output from previous day:  Intake/Output Summary (Last 24 hours) at 05/22/2020 1010 Last data filed at 05/22/2020 0845 Gross per 24 hour  Intake 540 ml  Output 2000 ml  Net -1460 ml     Intake/Output this shift: Total I/O In: 300 [P.O.:300] Out: 400 [Urine:400]  Labs: Recent Labs    05/21/20 0322 05/22/20 0330  HGB 10.5* 10.4*   Recent Labs    05/21/20 0322 05/22/20 0330  WBC 12.4* 16.6*  RBC 2.90* 2.89*  HCT 33.2* 33.1*  PLT 227 224   Recent Labs    05/21/20 0322 05/22/20 0330  NA 138 137  K 3.7 4.4  CL 102 102  CO2 29 26  BUN 9 14  CREATININE 0.71 0.74  GLUCOSE 153* 137*  CALCIUM 8.4* 9.1   No results for input(s): LABPT, INR in the last 72 hours.  Exam: General - Patient is Alert and Oriented Extremity - Neurologically intact Sensation intact distally Intact pulses distally Dorsiflexion/Plantar flexion intact Dressing - dressing C/D/I Motor Function - intact, moving foot and toes well on exam.   Past Medical History:  Diagnosis Date  . Anxiety and depression   . Arthritis   . Asthma   . CAD (coronary artery disease)    a. s/p BMS to RCA and LCX 2008; b. ISR of RCA rx'd with Xience DES 12/09; c. Requires extensive  sedation for cath; d. cath 11/10: nonobs; e.Cath 2/12: nonobs; f. 05/2011 Cath: nonobs; g. 02/2013 nonobs; h. 07/2013 lat isch on nuc-->cath: nonobstructive, EF 60-65%.  . Chronic chest pain   . Colitis    IBS  . Complication of anesthesia   . FUO (fever of unknown origin) 11/15/2019  . GERD (gastroesophageal reflux disease)   . H/O ETOH abuse   . H/O: suicide attempt    a. multiple  . Hyperlipidemia   . Hypertension   . Myocardial infarction ([place]) 2008  . Obesity   . Tobacco abuse     Assessment/Plan: 2 Days Post-Op Procedure(s) (LRB): TOTAL HIP ARTHROPLASTY ANTERIOR APPROACH (Left) Principal Problem:   Left hip OA Active Problems:   S/P left THA, AA   S/P hip replacement, left  Estimated body mass index is 37.89 kg/m as calculated from the following:   Height as of this encounter: 5' (1.524 m).   Weight as of this encounter: 88 kg. Advance diet Up with therapy  DVT Prophylaxis - Aspirin Weight bearing as tolerated.  Hemoglobin stable at 10.4 this AM.   Patient's goal is to go Home after hospital stay. She was limited with PT yesterday secondary to hip pain and is not safe for discharge at this point. Fortunately, her pain is significantly improved today and our focus will be on progressing  with PT today. She will work with PT on mobility so as to work towards safe discharge home tomorrow.   Griffith Citron, PA-C Orthopedic Surgery [phone] 05/22/2020, 10:10 AM

## 2020-05-22 NOTE — Progress Notes (Signed)
Physical Therapy Treatment Patient Details Name: Kara Lamb MRN: [id number] DOB: 1956-03-31 Today's Date: 05/22/2020    History of Present Illness Patient is 64 y.o. female s/p Lt THA anterior approach on 05/20/20 with PMH significant for OA, MI, obesity, HLD, GERD, asthma, CAD, anxiety, depression.    PT Comments    Patient continues to make good progress with acute therapy today and was demonstrated improved independence with bed mobility requiring min guard/supervision and use of belt to mobilize Lt LE. Pt demonstrates good carryover for use of RW with transfers and gait and ambulated ~190' with min guard for safety and no overt LOB. She will continue to benefit from skilled PT interventions to progress mobility as able for safe discharge home.    Follow Up Recommendations  Home health PT     Equipment Recommendations  Rolling walker with 5" wheels;3in1 (PT)    Recommendations for Other Services       Precautions / Restrictions Precautions Precautions: Fall Restrictions Weight Bearing Restrictions: No Other Position/Activity Restrictions: WBAT    Mobility  Bed Mobility Overal bed mobility: Needs Assistance Bed Mobility: Supine to Sit     Supine to sit: Supervision;HOB elevated Sit to supine: Supervision;Min guard;HOB elevated   General bed mobility comments: pt able to use gait belt to assist Lt LE off/on the bed. supervision/min guard for safety with transfering to EOB. pt requires increased time but no assist.  Transfers Overall transfer level: Needs assistance Equipment used: Rolling walker (2 wheeled) Transfers: Sit to/from Stand Sit to Stand: Min guard Stand pivot transfers: Min guard       General transfer comment: min guard for safety to rise from EOB and toilet, good carryover for safe tehnique with RW for power up and reach back to sit.  Ambulation/Gait Ambulation/Gait assistance: Min assist Gait Distance (Feet): 190 Feet Assistive device:  Rolling walker (2 wheeled) Gait Pattern/deviations: Decreased stride length;Decreased weight shift to left;Antalgic;Step-through pattern Gait velocity: decr   General Gait Details: pt performed several stretches on Lt LE prior to gait(hip extension, knee flexion). pt abel to ambulate with step through pattern and safe proximity to RW. Pt amb in room to bathroom and then out in hallway for increased distance.   Stairs             Wheelchair Mobility    Modified Rankin (Stroke Patients Only)       Balance Overall balance assessment: Needs assistance Sitting-balance support: Feet supported;Bilateral upper extremity supported Sitting balance-Leahy Scale: Fair     Standing balance support: During functional activity;Bilateral upper extremity supported Standing balance-Leahy Scale: Fair                              Cognition Arousal/Alertness: Awake/alert Behavior During Therapy: WFL for tasks assessed/performed Overall Cognitive Status: Within Functional Limits for tasks assessed                                        Exercises Total Joint Exercises Ankle Circles/Pumps: AROM;Both;15 reps;Supine    General Comments        Pertinent Vitals/Pain Pain Assessment: Faces Faces Pain Scale: Hurts little more Pain Location: Lt hip Pain Descriptors / Indicators: Aching;Discomfort;Guarding;Grimacing Pain Intervention(s): Limited activity within patient's tolerance;Monitored during session;Repositioned;Ice applied;Patient requesting pain meds-RN notified    Home Living  Prior Function            PT Goals (current goals can now be found in the care plan section) Acute Rehab PT Goals Patient Stated Goal: stop hurting PT Goal Formulation: With patient Time For Goal Achievement: 05/27/20 Potential to Achieve Goals: Good Progress towards PT goals: Progressing toward goals    Frequency    7X/week      PT  Plan Current plan remains appropriate    Co-evaluation              AM-PAC PT "6 Clicks" Mobility   Outcome Measure  Help needed turning from your back to your side while in a flat bed without using bedrails?: A Little Help needed moving from lying on your back to sitting on the side of a flat bed without using bedrails?: A Little Help needed moving to and from a bed to a chair (including a wheelchair)?: A Little Help needed standing up from a chair using your arms (e.g., wheelchair or bedside chair)?: A Little Help needed to walk in hospital room?: A Little Help needed climbing 3-5 steps with a railing? : A Lot 6 Click Score: 17    End of Session Equipment Utilized During Treatment: Gait belt Activity Tolerance: Patient limited by pain Patient left: in bed;with call bell/phone within reach;with bed alarm set Nurse Communication: Mobility status PT Visit Diagnosis: Muscle weakness (generalized) (M62.81);Difficulty in walking, not elsewhere classified (R26.2);Pain Pain - Right/Left: Left Pain - part of body: Hip     Time: 5102-5852 PT Time Calculation (min) (ACUTE ONLY): 28 min  Charges:  $Gait Training: 8-22 mins $Therapeutic Activity: 8-22 mins                     Verner Mould, DPT Acute Rehabilitation Services  Office [phone] Pager [phone]  05/22/2020 4:04 PM

## 2020-05-23 MED ORDER — OXYCODONE HCL 5 MG PO TABS: 5.0000 mg | ORAL_TABLET | Freq: Four times a day (QID) | ORAL | 0 refills | Status: DC | PRN

## 2020-05-23 NOTE — Progress Notes (Signed)
Physical Therapy Treatment Patient Details Name: Kara Lamb MRN: [id number] DOB: 1955/12/07 Today's Date: 05/23/2020    History of Present Illness Patient is 64 y.o. female s/p Lt THA anterior approach on 05/20/20 with PMH significant for OA, MI, obesity, HLD, GERD, asthma, CAD, anxiety, depression.    PT Comments    Patient has made excellent progress over the last ~24 hours with acute therapy. He pain continues to improve and pt is now ambulating with step through pattern with no difficulty clearing Lt LE from the floor while advancing. HEP reviewed for standing and seated exercises with pt only c/o pain with marching Lt LE. Educated pt on walking daily to improve mobility and strength and on frequency of HEP. She is safe at this time to discharge home with HHPT services to further progress mobility and independence. Acute PT will follow throughout hospital stay.    Follow Up Recommendations  Home health PT     Equipment Recommendations  Rolling walker with 5" wheels;3in1 (PT)    Recommendations for Other Services       Precautions / Restrictions Precautions Precautions: Fall Restrictions Weight Bearing Restrictions: No Other Position/Activity Restrictions: WBAT    Mobility  Bed Mobility Overal bed mobility: Needs Assistance Bed Mobility: Supine to Sit     Supine to sit: Supervision;HOB elevated     General bed mobility comments: pt using gait belt for Lt LE, requires extra time.  Transfers Overall transfer level: Needs assistance Equipment used: Rolling walker (2 wheeled) Transfers: Sit to/from Stand Sit to Stand: Supervision         General transfer comment: supervision for sit<>stand from EOB and toilet, no assist needed and pt steady in standing.  Ambulation/Gait Ambulation/Gait assistance: Supervision;Min guard Gait Distance (Feet): 250 Feet Assistive device: Rolling walker (2 wheeled) Gait Pattern/deviations: Decreased stride length;Decreased  weight shift to left;Antalgic;Step-through pattern Gait velocity: decr   General Gait Details: pt performing Lt hip stretches (LE extension) pre-gait to reduce stiffness. No asssit required for safe walker management and no overt LOB noted.   Stairs             Wheelchair Mobility    Modified Rankin (Stroke Patients Only)       Balance Overall balance assessment: Needs assistance Sitting-balance support: Feet supported;Bilateral upper extremity supported Sitting balance-Leahy Scale: Fair     Standing balance support: During functional activity;Bilateral upper extremity supported Standing balance-Leahy Scale: Fair                              Cognition Arousal/Alertness: Awake/alert Behavior During Therapy: WFL for tasks assessed/performed Overall Cognitive Status: Within Functional Limits for tasks assessed                                        Exercises Total Joint Exercises Ankle Circles/Pumps: AROM;Both;15 reps;Supine Hip ABduction/ADduction: AROM;Both;5 reps;Standing Knee Flexion: AROM;Both;5 reps;Standing Standing Hip Extension: AROM;Both;5 reps;Standing    General Comments        Pertinent Vitals/Pain Pain Assessment: Faces Faces Pain Scale: Hurts a little bit Pain Location: Lt hip Pain Descriptors / Indicators: Aching;Discomfort;Guarding;Grimacing Pain Intervention(s): Limited activity within patient's tolerance;Monitored during session;Repositioned;Patient requesting pain meds-RN notified;Ice applied    Home Living                      Prior Function  PT Goals (current goals can now be found in the care plan section) Acute Rehab PT Goals Patient Stated Goal: stop hurting PT Goal Formulation: With patient Time For Goal Achievement: 05/27/20 Potential to Achieve Goals: Good Progress towards PT goals: Progressing toward goals    Frequency    7X/week      PT Plan Current plan remains  appropriate    Co-evaluation              AM-PAC PT "6 Clicks" Mobility   Outcome Measure  Help needed turning from your back to your side while in a flat bed without using bedrails?: A Little Help needed moving from lying on your back to sitting on the side of a flat bed without using bedrails?: A Little Help needed moving to and from a bed to a chair (including a wheelchair)?: A Little Help needed standing up from a chair using your arms (e.g., wheelchair or bedside chair)?: A Little Help needed to walk in hospital room?: A Little Help needed climbing 3-5 steps with a railing? : A Lot 6 Click Score: 17    End of Session Equipment Utilized During Treatment: Gait belt Activity Tolerance: Patient limited by pain Patient left: in bed;with call bell/phone within reach;with bed alarm set Nurse Communication: Mobility status PT Visit Diagnosis: Muscle weakness (generalized) (M62.81);Difficulty in walking, not elsewhere classified (R26.2);Pain Pain - Right/Left: Left Pain - part of body: Hip     Time: 1110-1145 PT Time Calculation (min) (ACUTE ONLY): 35 min  Charges:  $Gait Training: 8-22 mins $Therapeutic Exercise: 8-22 mins                     Verner Mould, DPT Acute Rehabilitation Services  Office [phone] Pager [phone]  05/23/2020 11:55 AM

## 2020-05-23 NOTE — TOC Progression Note (Addendum)
Transition of Care [facility]) - Progression Note    Patient Details  Name: Kentrell Guettler MRN: [id number] Date of Birth: [birth year]  Transition of Care [facility]) CM/SW [place], LCSW Phone Number: 05/23/2020, 10:13 AM  Clinical Narrative:    Physician recommendation for HHPT. Orthopedic Bundle pt. CSW notified CM Sharee Pimple.   Received message [facility] arranged to provide HHPT.       Expected Discharge Plan and Services           Expected Discharge Date: 05/23/20               DME Arranged: Gilford Rile rolling,3-N-1 DME Agency: [employer]       HH Arranged: NA HH Agency: NA       Social Determinants of Health (SDOH) Interventions    Readmission Risk Interventions No flowsheet data found.

## 2020-05-23 NOTE — Plan of Care (Signed)
  Problem: Health Behavior/Discharge Planning: Goal: Ability to manage health-related needs will improve Outcome: Progressing   Problem: Activity: Goal: Risk for activity intolerance will decrease Outcome: Progressing   Problem: Coping: Goal: Level of anxiety will decrease Outcome: Progressing   

## 2020-05-23 NOTE — Plan of Care (Signed)
Patient discharged, all care plans met ° °

## 2020-05-23 NOTE — Progress Notes (Signed)
Patient ID: Kara Lamb, female   DOB: Apr 26, 1956, 64 y.o.   MRN: [id number] Subjective: 3 Days Post-Op Procedure(s) (LRB): TOTAL HIP ARTHROPLASTY ANTERIOR APPROACH (Left)    Patient reports pain as mild.  She has noted significant improvement since her her first post op 1.5 days.  Appreciative of the care she has received.  Objective:   VITALS:   Vitals:   05/22/20 2203 05/23/20 0445  BP: (!) 98/53 (!) 106/58  Pulse: 71 68  Resp: 16 16  Temp: 98.6 F (37 C) 98.2 F (36.8 C)  SpO2: 97% 97%    Neurovascular intact Incision: dressing C/D/I  LABS Recent Labs    05/21/20 0322 05/22/20 0330  HGB 10.5* 10.4*  HCT 33.2* 33.1*  WBC 12.4* 16.6*  PLT 227 224    Recent Labs    05/21/20 0322 05/22/20 0330  NA 138 137  K 3.7 4.4  BUN 9 14  CREATININE 0.71 0.74  GLUCOSE 153* 137*    No results for input(s): LABPT, INR in the last 72 hours.   Assessment/Plan: 3 Days Post-Op Procedure(s) (LRB): TOTAL HIP ARTHROPLASTY ANTERIOR APPROACH (Left)   Up with therapy  Will discharge to home today with HHPT for 2 weeks to get settled in her home RTC in 2 weeks RTC in 2 weeks Rx sent to pharmacy of choice

## 2020-05-25 DIAGNOSIS — K219 Gastro-esophageal reflux disease without esophagitis: Secondary | ICD-10-CM | POA: Diagnosis not present

## 2020-05-25 DIAGNOSIS — Z79899 Other long term (current) drug therapy: Secondary | ICD-10-CM | POA: Diagnosis not present

## 2020-05-25 DIAGNOSIS — F411 Generalized anxiety disorder: Secondary | ICD-10-CM | POA: Diagnosis not present

## 2020-05-25 DIAGNOSIS — F319 Bipolar disorder, unspecified: Secondary | ICD-10-CM | POA: Diagnosis not present

## 2020-05-25 DIAGNOSIS — J449 Chronic obstructive pulmonary disease, unspecified: Secondary | ICD-10-CM | POA: Diagnosis not present

## 2020-05-25 DIAGNOSIS — K529 Noninfective gastroenteritis and colitis, unspecified: Secondary | ICD-10-CM | POA: Diagnosis not present

## 2020-05-25 DIAGNOSIS — I252 Old myocardial infarction: Secondary | ICD-10-CM | POA: Diagnosis not present

## 2020-05-25 DIAGNOSIS — Z96642 Presence of left artificial hip joint: Secondary | ICD-10-CM | POA: Diagnosis not present

## 2020-05-25 DIAGNOSIS — I10 Essential (primary) hypertension: Secondary | ICD-10-CM | POA: Diagnosis not present

## 2020-05-25 DIAGNOSIS — R32 Unspecified urinary incontinence: Secondary | ICD-10-CM | POA: Diagnosis not present

## 2020-05-25 DIAGNOSIS — Z471 Aftercare following joint replacement surgery: Secondary | ICD-10-CM | POA: Diagnosis not present

## 2020-05-25 DIAGNOSIS — E785 Hyperlipidemia, unspecified: Secondary | ICD-10-CM | POA: Diagnosis not present

## 2020-05-25 DIAGNOSIS — Z6837 Body mass index (BMI) 37.0-37.9, adult: Secondary | ICD-10-CM | POA: Diagnosis not present

## 2020-05-25 DIAGNOSIS — I2510 Atherosclerotic heart disease of native coronary artery without angina pectoris: Secondary | ICD-10-CM | POA: Diagnosis not present

## 2020-05-25 DIAGNOSIS — Z87891 Personal history of nicotine dependence: Secondary | ICD-10-CM | POA: Diagnosis not present

## 2020-05-25 NOTE — Discharge Summary (Signed)
Patient ID: Kara Lamb MRN: [id number] DOB/AGE: 1955-10-28 64 y.o.  Admit date: 05/20/2020 Discharge date: 05/25/2020  Admission Diagnoses:  Principal Problem:   Left hip OA Active Problems:   S/P left THA, AA   S/P hip replacement, left   Discharge Diagnoses:  Same  Past Medical History:  Diagnosis Date  . Anxiety and depression   . Arthritis   . Asthma   . CAD (coronary artery disease)    a. s/p BMS to RCA and LCX 2008; b. ISR of RCA rx'd with Xience DES 12/09; c. Requires extensive sedation for cath; d. cath 11/10: nonobs; e.Cath 2/12: nonobs; f. 05/2011 Cath: nonobs; g. 02/2013 nonobs; h. 07/2013 lat isch on nuc-->cath: nonobstructive, EF 60-65%.  . Chronic chest pain   . Colitis    IBS  . Complication of anesthesia   . FUO (fever of unknown origin) 11/15/2019  . GERD (gastroesophageal reflux disease)   . H/O ETOH abuse   . H/O: suicide attempt    a. multiple  . Hyperlipidemia   . Hypertension   . Myocardial infarction ([place]) 2008  . Obesity   . Tobacco abuse     Surgeries: Procedure(s): LEFT TOTAL HIP ARTHROPLASTY ANTERIOR APPROACH on 05/20/2020   Consultants: N/A  Discharged Condition: Improved  Hospital Course: Zavia Pullen is an 64 y.o. female who was admitted 05/20/2020 for operative treatment of Osteoarthritis of left hip. Patient has severe unremitting pain that affects sleep, daily activities, and work/hobbies. After pre-op clearance the patient was taken to the operating room on 05/20/2020 and underwent  Procedure(s):  LEFT TOTAL HIP ARTHROPLASTY ANTERIOR APPROACH.    Patient was given perioperative antibiotics:  Anti-infectives (From admission, onward)   Start     Dose/Rate Route Frequency Ordered Stop   05/20/20 1500  ceFAZolin (ANCEF) IVPB 1 g/50 mL premix        1 g 100 mL/hr over 30 Minutes Intravenous Every 6 hours 05/20/20 1342 05/20/20 2131   05/20/20 0630  ceFAZolin (ANCEF) IVPB 2g/100 mL premix        2 g 200 mL/hr over 30 Minutes  Intravenous On call to O.R. 05/20/20 8182 05/20/20 0845       Patient was given sequential compression devices, early ambulation, and chemoprophylaxis to prevent DVT.  Patient benefited maximally from hospital stay and there were no complications.    Recent vital signs: No data found.   Recent laboratory studies: No results for input(s): WBC, HGB, HCT, PLT, NA, K, CL, CO2, BUN, CREATININE, GLUCOSE, INR, CALCIUM in the last 72 hours.  Invalid input(s): PT, 2   Discharge Medications:   Allergies as of 05/23/2020      Reactions   Prednisone Other (See Comments)   Mood changes/bloating   Heparin Other (See Comments)   Injections in the stomach causes huge knots, tenderness, and pain for weeks (hematomas)   Imdur [isosorbide Nitrate]    Low blood pressure   Nitroglycerin Nausea Only   Patches--severe headache Drip--nausea and vomiting   Paroxetine Hcl Diarrhea   Severe   Perphenazine Other (See Comments)   Cannot recall   Zoloft [sertraline Hcl]    Severe diarrhea   Zolpidem Other (See Comments)   Sleep walking and loss of memory      Medication List    STOP taking these medications   traMADol 50 MG tablet Commonly known as: ULTRAM     TAKE these medications   aspirin EC 81 MG tablet Take 1 tablet (81 mg total)  by mouth every morning. For heart health   atorvastatin 40 MG tablet Commonly known as: LIPITOR Take 1 tablet (40 mg total) by mouth daily at 6 PM. NEEDS APPOINTMENT FOR FUTURE REFILLS What changed: when to take this   clopidogrel 75 MG tablet Commonly known as: PLAVIX Take 1 tablet (75 mg total) by mouth daily. NEEDS APPOINTMENT FOR FUTURE REFILLS OR 90-DAY SUPPLY What changed: when to take this   [facility] Nicotine Polacrilex 4 MG lozenge Generic drug: nicotine polacrilex TAKE 1 LOZENGE BY MOUTH AS NEEDED FOR SMOKING CESSATION What changed: See the new instructions.   diazepam 5 MG tablet Commonly known as: VALIUM Take 5 mg by mouth daily as needed  (anxiety).   docusate sodium 100 MG capsule Commonly known as: Colace Take 1 capsule (100 mg total) by mouth 2 (two) times daily.   ferrous sulfate 325 (65 FE) MG tablet Commonly known as: FerrouSul Take 1 tablet (325 mg total) by mouth 3 (three) times daily with meals for 14 days.   lamoTRIgine 200 MG tablet Commonly known as: LAMICTAL Take 200 mg by mouth daily.   levothyroxine 88 MCG tablet Commonly known as: SYNTHROID Take 88 mcg by mouth daily before breakfast.   methocarbamol 500 MG tablet Commonly known as: Robaxin Take 1 tablet (500 mg total) by mouth every 6 (six) hours as needed for muscle spasms.   metoprolol tartrate 25 MG tablet Commonly known as: LOPRESSOR Take 1 tablet (25 mg total) by mouth 2 (two) times daily. NEEDS APPOINTMENT FOR FUTURE REFILLS OR 90-DAY SUPPLY What changed:   when to take this  additional instructions   multivitamin with minerals Tabs tablet Take 1 tablet by mouth daily.   nitroGLYCERIN 0.4 MG SL tablet Commonly known as: NITROSTAT Place 1 tablet (0.4 mg total) under the tongue every 5 (five) minutes as needed for chest pain. What changed: when to take this   olmesartan 5 MG tablet Commonly known as: BENICAR Take 5 mg by mouth daily after breakfast.   oxyCODONE 5 MG immediate release tablet Commonly known as: Oxy IR/ROXICODONE Take 1-2 tablets (5-10 mg total) by mouth every 6 (six) hours as needed for moderate pain or severe pain.   pantoprazole 40 MG tablet Commonly known as: PROTONIX Take 40 mg by mouth daily before breakfast.   polyethylene glycol 17 g packet Commonly known as: MIRALAX / GLYCOLAX Take 17 g by mouth 2 (two) times daily.   prazosin 1 MG capsule Commonly known as: MINIPRESS Take 2 mg by mouth at bedtime.   ranolazine 500 MG 12 hr tablet Commonly known as: RANEXA Take 500 mg by mouth in the morning and at bedtime.   topiramate 50 MG tablet Commonly known as: TOPAMAX Take 50 mg by mouth at bedtime.    traZODone 100 MG tablet Commonly known as: DESYREL Take 100 mg by mouth at bedtime.   Trulicity 5.00 BB/0.4UG Sopn Generic drug: Dulaglutide Inject 0.75 mg into the skin every Thursday.   venlafaxine XR 150 MG 24 hr capsule Commonly known as: EFFEXOR-XR Take 150 mg by mouth daily.            Discharge Care Instructions  (From admission, onward)         Start     Ordered   05/23/20 0000  Change dressing       Comments: Maintain surgical dressing until follow up in the clinic. If the edges start to pull up, may reinforce with tape. If the dressing is no longer working,  may remove and cover with gauze and tape, but must keep the area dry and clean.  Call with any questions or concerns.   05/23/20 0850          Diagnostic Studies: DG Pelvis Portable  Result Date: 05/20/2020 CLINICAL DATA:  Left hip arthroplasty EXAM: PORTABLE PELVIS 1-2 VIEWS COMPARISON:  Fluoroscopy from earlier today FINDINGS: Total left hip arthroplasty which is located and well seated in the frontal projection. No periprosthetic fracture. IMPRESSION: Left hip arthroplasty without complicating feature. Electronically Signed   By: Monte Fantasia M.D.   On: 05/20/2020 10:46   DG C-Arm 1-60 Min-No Report  Result Date: 05/20/2020 Fluoroscopy was utilized by the requesting physician.  No radiographic interpretation.   DG HIP OPERATIVE UNILAT W OR W/O PELVIS LEFT  Result Date: 05/20/2020 CLINICAL DATA:  Left hip arthroplasty. EXAM: OPERATIVE LEFT HIP (WITH PELVIS IF PERFORMED) 5 VIEWS TECHNIQUE: Fluoroscopic spot image(s) were submitted for interpretation post-operatively. FLUOROSCOPY TIME:  Fluoroscopy Time: 11 seconds COMPARISON:  Left hip radiographs 12/19/2019 FINDINGS: Intraoperative images are submitted during performance of a left total hip arthroplasty. No fracture is evident on these limited images. IMPRESSION: Intraoperative images during left hip arthroplasty. Electronically Signed   By: Logan Bores  M.D.   On: 05/20/2020 10:12    Disposition: Home  Discharge Instructions    Call MD / Call 911   Complete by: As directed    If you experience chest pain or shortness of breath, CALL 911 and be transported to the hospital emergency room.  If you develope a fever above 101 F, pus (white drainage) or increased drainage or redness at the wound, or calf pain, call your surgeon's office.   Change dressing   Complete by: As directed    Maintain surgical dressing until follow up in the clinic. If the edges start to pull up, may reinforce with tape. If the dressing is no longer working, may remove and cover with gauze and tape, but must keep the area dry and clean.  Call with any questions or concerns.   Constipation Prevention   Complete by: As directed    Drink plenty of fluids.  Prune juice may be helpful.  You may use a stool softener, such as Colace (over the counter) 100 mg twice a day.  Use MiraLax (over the counter) for constipation as needed.   Diet - low sodium heart healthy   Complete by: As directed    Discharge instructions   Complete by: As directed    Maintain surgical dressing until follow up in the clinic. If the edges start to pull up, may reinforce with tape. If the dressing is no longer working, may remove and cover with gauze and tape, but must keep the area dry and clean.  Follow up in 2 weeks at [facility]. Call with any questions or concerns.   Increase activity slowly as tolerated   Complete by: As directed    Weight bearing as tolerated with assist device (walker, cane, etc) as directed, use it as long as suggested by your surgeon or therapist, typically at least 4-6 weeks.   TED hose   Complete by: As directed    Use stockings (TED hose) for 2 weeks on both leg(s).  You may remove them at night for sleeping.       Follow-up Information    Paralee Cancel, MD. Go on 06/04/2020.   Specialty: Orthopedic Surgery Why: You are scheduled for a post-operative appointment on  06-04-20 at 3:45  pm.  Contact information: [address] [place] [address] [phone]        [facility] Follow up.   Specialty: Home Health Services Why: This agency will provide home physical therapy. The agency will contact you prior to the first the visit.  Contact information: [address] [place] [address] [phone]                Signed: Lucille Passy [facility] 05/25/2020, 3:35 PM

## 2020-05-27 DIAGNOSIS — I10 Essential (primary) hypertension: Secondary | ICD-10-CM | POA: Diagnosis not present

## 2020-05-27 DIAGNOSIS — F411 Generalized anxiety disorder: Secondary | ICD-10-CM | POA: Diagnosis not present

## 2020-05-27 DIAGNOSIS — I252 Old myocardial infarction: Secondary | ICD-10-CM | POA: Diagnosis not present

## 2020-05-27 DIAGNOSIS — Z471 Aftercare following joint replacement surgery: Secondary | ICD-10-CM | POA: Diagnosis not present

## 2020-05-27 DIAGNOSIS — I2510 Atherosclerotic heart disease of native coronary artery without angina pectoris: Secondary | ICD-10-CM | POA: Diagnosis not present

## 2020-05-27 DIAGNOSIS — J449 Chronic obstructive pulmonary disease, unspecified: Secondary | ICD-10-CM | POA: Diagnosis not present

## 2020-05-29 DIAGNOSIS — I10 Essential (primary) hypertension: Secondary | ICD-10-CM | POA: Diagnosis not present

## 2020-05-29 DIAGNOSIS — I2510 Atherosclerotic heart disease of native coronary artery without angina pectoris: Secondary | ICD-10-CM | POA: Diagnosis not present

## 2020-05-29 DIAGNOSIS — F411 Generalized anxiety disorder: Secondary | ICD-10-CM | POA: Diagnosis not present

## 2020-05-29 DIAGNOSIS — Z471 Aftercare following joint replacement surgery: Secondary | ICD-10-CM | POA: Diagnosis not present

## 2020-05-29 DIAGNOSIS — I252 Old myocardial infarction: Secondary | ICD-10-CM | POA: Diagnosis not present

## 2020-05-29 DIAGNOSIS — J449 Chronic obstructive pulmonary disease, unspecified: Secondary | ICD-10-CM | POA: Diagnosis not present

## 2020-06-02 DIAGNOSIS — I10 Essential (primary) hypertension: Secondary | ICD-10-CM | POA: Diagnosis not present

## 2020-06-02 DIAGNOSIS — Z471 Aftercare following joint replacement surgery: Secondary | ICD-10-CM | POA: Diagnosis not present

## 2020-06-02 DIAGNOSIS — I252 Old myocardial infarction: Secondary | ICD-10-CM | POA: Diagnosis not present

## 2020-06-02 DIAGNOSIS — F411 Generalized anxiety disorder: Secondary | ICD-10-CM | POA: Diagnosis not present

## 2020-06-02 DIAGNOSIS — J449 Chronic obstructive pulmonary disease, unspecified: Secondary | ICD-10-CM | POA: Diagnosis not present

## 2020-06-02 DIAGNOSIS — I2510 Atherosclerotic heart disease of native coronary artery without angina pectoris: Secondary | ICD-10-CM | POA: Diagnosis not present

## 2020-06-03 ENCOUNTER — Emergency Department (HOSPITAL_COMMUNITY)
Admission: EM | Admit: 2020-06-03 | Discharge: 2020-06-03 | Disposition: A | Discharge status: Home / Self Care | Payer: Medicare Other | Attending: Emergency Medicine | Admitting: Emergency Medicine

## 2020-06-03 ENCOUNTER — Other Ambulatory Visit: Payer: Self-pay

## 2020-06-03 ENCOUNTER — Encounter (HOSPITAL_COMMUNITY): Payer: Self-pay

## 2020-06-03 DIAGNOSIS — Z87891 Personal history of nicotine dependence: Secondary | ICD-10-CM | POA: Insufficient documentation

## 2020-06-03 DIAGNOSIS — Z955 Presence of coronary angioplasty implant and graft: Secondary | ICD-10-CM | POA: Insufficient documentation

## 2020-06-03 DIAGNOSIS — Z7901 Long term (current) use of anticoagulants: Secondary | ICD-10-CM | POA: Insufficient documentation

## 2020-06-03 DIAGNOSIS — Z96642 Presence of left artificial hip joint: Secondary | ICD-10-CM | POA: Insufficient documentation

## 2020-06-03 DIAGNOSIS — J45909 Unspecified asthma, uncomplicated: Secondary | ICD-10-CM | POA: Diagnosis not present

## 2020-06-03 DIAGNOSIS — I2510 Atherosclerotic heart disease of native coronary artery without angina pectoris: Secondary | ICD-10-CM | POA: Diagnosis not present

## 2020-06-03 DIAGNOSIS — R600 Localized edema: Secondary | ICD-10-CM | POA: Diagnosis not present

## 2020-06-03 DIAGNOSIS — R609 Edema, unspecified: Secondary | ICD-10-CM | POA: Diagnosis not present

## 2020-06-03 DIAGNOSIS — I10 Essential (primary) hypertension: Secondary | ICD-10-CM | POA: Diagnosis not present

## 2020-06-03 DIAGNOSIS — Z7982 Long term (current) use of aspirin: Secondary | ICD-10-CM | POA: Insufficient documentation

## 2020-06-03 DIAGNOSIS — Z79899 Other long term (current) drug therapy: Secondary | ICD-10-CM | POA: Diagnosis not present

## 2020-06-03 DIAGNOSIS — R2240 Localized swelling, mass and lump, unspecified lower limb: Secondary | ICD-10-CM | POA: Diagnosis present

## 2020-06-03 MED ORDER — RIVAROXABAN (XARELTO) VTE STARTER PACK (15 & 20 MG): ORAL_TABLET | ORAL | 0 refills | Status: DC

## 2020-06-03 MED ORDER — RIVAROXABAN 15 MG PO TABS
15.0000 mg | ORAL_TABLET | Freq: Once | ORAL | Status: AC
  Administered 2020-06-03: 15 mg via ORAL
  Filled 2020-06-03: qty 1

## 2020-06-03 MED ORDER — OXYCODONE-ACETAMINOPHEN 5-325 MG PO TABS
2.0000 | ORAL_TABLET | Freq: Once | ORAL | Status: AC
Start: 2020-06-03 — End: 2020-06-03
  Administered 2020-06-03: 2 via ORAL
  Filled 2020-06-03: qty 2

## 2020-06-03 NOTE — ED Triage Notes (Signed)
Patient arrived stating that she had left hip surgery on 05/20/20 and has had left leg swelling since. Patient has been wearing compression stockings consistently since surgery and the band is causing indention's in the leg.

## 2020-06-03 NOTE — ED Provider Notes (Signed)
[facility]-EMERGENCY DEPT Provider Note   CSN: [id number] Arrival date & time: 06/03/20  0133     History Chief Complaint  Patient presents with  . Leg Swelling    Kara Lamb is a 64 y.o. female.  HPI      Past Medical History:  Diagnosis Date  . Anxiety and depression   . Arthritis   . Asthma   . CAD (coronary artery disease)    a. s/p BMS to RCA and LCX 2008; b. ISR of RCA rx'd with Xience DES 12/09; c. Requires extensive sedation for cath; d. cath 11/10: nonobs; e.Cath 2/12: nonobs; f. 05/2011 Cath: nonobs; g. 02/2013 nonobs; h. 07/2013 lat isch on nuc-->cath: nonobstructive, EF 60-65%.  . Chronic chest pain   . Colitis    IBS  . Complication of anesthesia   . FUO (fever of unknown origin) 11/15/2019  . GERD (gastroesophageal reflux disease)   . H/O ETOH abuse   . H/O: suicide attempt    a. multiple  . Hyperlipidemia   . Hypertension   . Myocardial infarction (HCC) 2008  . Obesity   . Tobacco abuse     Patient Active Problem List   Diagnosis Date Noted  . S/P left THA, AA 05/20/2020  . Left hip OA 05/20/2020  . S/P hip replacement, left 05/20/2020  . FUO (fever of unknown origin) 11/15/2019  . Abnormal CT lung screening 11/20/2014  . Midsternal chest pain 09/17/2014  . Multiple drug overdose   . Suicide attempt by drug ingestion (HCC)   . Chest pain, negative MI 05/11/2014  . Tobacco use disorder 01/22/2014  . Severe obesity (BMI >= 40) (HCC) 01/22/2014  . Abnormal myocardial perfusion study 07/25/2013  . CAD (coronary artery disease)   . Chronic chest pain   . H/O: suicide attempt   . Obesity BMI 39   . GERD (gastroesophageal reflux disease)   . Asthmatic bronchitis , chronic (HCC) 07/05/2013  . Severe major depression without psychotic features (HCC) 03/01/2013  . GAD (generalized anxiety disorder) 03/01/2013  . Borderline behavior 06/16/2011  . Bipolar 1 disorder (HCC) 11/30/2010  . Dyslipidemia 08/21/2008  .  HYPERTENSION, BENIGN 08/21/2008    Past Surgical History:  Procedure Laterality Date  . CARDIAC CATHETERIZATION  2008   stents  . CARPAL TUNNEL RELEASE    . CESAREAN SECTION     x 3  . COLON BIOPSY  08/12/2008  . LEFT HEART CATHETERIZATION WITH CORONARY ANGIOGRAM  06/04/2011   Procedure: LEFT HEART CATHETERIZATION WITH CORONARY ANGIOGRAM;  Surgeon: Kathleene Hazel, MD;  Location: [facility] CATH LAB;  Service: Cardiovascular;;  . LEFT HEART CATHETERIZATION WITH CORONARY ANGIOGRAM N/A 02/22/2013   Procedure: LEFT HEART CATHETERIZATION WITH CORONARY ANGIOGRAM;  Surgeon: Alvia Grove, MD;  Location: [facility] CATH LAB;  Service: Cardiovascular;  Laterality: N/A;  . LEFT HEART CATHETERIZATION WITH CORONARY ANGIOGRAM N/A 07/25/2013   Procedure: LEFT HEART CATHETERIZATION WITH CORONARY ANGIOGRAM;  Surgeon: Lesleigh Noe, MD;  Location: [facility] CATH LAB;  Service: Cardiovascular;  Laterality: N/A;  . PTCA     stent  . TOTAL HIP ARTHROPLASTY Left 05/20/2020   Procedure: TOTAL HIP ARTHROPLASTY ANTERIOR APPROACH;  Surgeon: Durene Romans, MD;  Location: [facility] ORS;  Service: Orthopedics;  Laterality: Left;  70 mins  . TUBAL LIGATION  1982     OB History   No obstetric history on file.     Family History  Problem Relation Age of Onset  . Coronary artery  disease Mother   . Emphysema Mother   . Colon cancer Father   . Prostate cancer Father     Social History   Tobacco Use  . Smoking status: Former Smoker    Packs/day: 0.25    Years: 43.00    Pack years: 10.75    Types: Cigarettes    Quit date: 11/07/2019    Years since quitting: 0.5  . Smokeless tobacco: Never Used  Vaping Use  . Vaping Use: Never used  Substance Use Topics  . Alcohol use: Yes    Alcohol/week: 0.0 standard drinks    Comment: rarely  . Drug use: No    Home Medications Prior to Admission medications   Medication Sig Start Date End Date Taking? Authorizing Provider  aspirin EC 81 MG tablet Take 1 tablet (81 mg total)  by mouth every morning. For heart health 03/06/13   Lindell Spar I, NP  atorvastatin (LIPITOR) 40 MG tablet Take 1 tablet (40 mg total) by mouth daily at 6 PM. NEEDS APPOINTMENT FOR FUTURE REFILLS Patient taking differently: Take 40 mg by mouth daily after supper. NEEDS APPOINTMENT FOR FUTURE REFILLS 08/18/15   Minus Breeding, MD  clopidogrel (PLAVIX) 75 MG tablet Take 1 tablet (75 mg total) by mouth daily. NEEDS APPOINTMENT FOR FUTURE REFILLS OR 90-DAY SUPPLY Patient taking differently: Take 75 mg by mouth daily after supper. NEEDS APPOINTMENT FOR FUTURE REFILLS OR 90-DAY SUPPLY 08/18/15   Minus Breeding, MD  [facility] NICOTINE POLACRILEX 4 MG lozenge TAKE 1 LOZENGE BY MOUTH AS NEEDED FOR SMOKING CESSATION Patient taking differently: Take 4 mg by mouth as needed for smoking cessation.  04/15/14   Elsie Stain, MD  diazepam (VALIUM) 5 MG tablet Take 5 mg by mouth daily as needed (anxiety).  11/09/19   [provider]  docusate sodium (COLACE) 100 MG capsule Take 1 capsule (100 mg total) by mouth 2 (two) times daily. 05/21/20   Danae Orleans, PA-C  Dulaglutide (TRULICITY) 6.01 UX/3.2TF SOPN Inject 0.75 mg into the skin every Thursday.  04/28/17   [provider]  ferrous sulfate (FERROUSUL) 325 (65 FE) MG tablet Take 1 tablet (325 mg total) by mouth 3 (three) times daily with meals for 14 days. 05/21/20 06/04/20  Danae Orleans, PA-C  lamoTRIgine (LAMICTAL) 200 MG tablet Take 200 mg by mouth daily. 04/03/20   [provider]  levothyroxine (SYNTHROID) 88 MCG tablet Take 88 mcg by mouth daily before breakfast. 03/03/20   [provider]  methocarbamol (ROBAXIN) 500 MG tablet Take 1 tablet (500 mg total) by mouth every 6 (six) hours as needed for muscle spasms. 05/21/20   Danae Orleans, PA-C  metoprolol tartrate (LOPRESSOR) 25 MG tablet Take 1 tablet (25 mg total) by mouth 2 (two) times daily. NEEDS APPOINTMENT FOR FUTURE REFILLS OR 90-DAY SUPPLY Patient taking differently:  Take 25 mg by mouth daily.  08/18/15   Minus Breeding, MD  Multiple Vitamin (MULTIVITAMIN WITH MINERALS) TABS tablet Take 1 tablet by mouth daily.    [provider]  nitroGLYCERIN (NITROSTAT) 0.4 MG SL tablet Place 1 tablet (0.4 mg total) under the tongue every 5 (five) minutes as needed for chest pain. Patient taking differently: Place 0.4 mg under the tongue every 5 (five) minutes x 3 doses as needed for chest pain.  06/21/13   Minus Breeding, MD  olmesartan (BENICAR) 5 MG tablet Take 5 mg by mouth daily after breakfast.    [provider]  oxyCODONE (OXY IR/ROXICODONE) 5 MG immediate release  tablet Take 1-2 tablets (5-10 mg total) by mouth every 6 (six) hours as needed for moderate pain or severe pain. 05/23/20   Lanney Gins, PA-C  pantoprazole (PROTONIX) 40 MG tablet Take 40 mg by mouth daily before breakfast.  09/25/19   [provider]  polyethylene glycol (MIRALAX / GLYCOLAX) 17 g packet Take 17 g by mouth 2 (two) times daily. 05/21/20   Lanney Gins, PA-C  prazosin (MINIPRESS) 1 MG capsule Take 2 mg by mouth at bedtime.  01/21/19   [provider]  ranolazine (RANEXA) 500 MG 12 hr tablet Take 500 mg by mouth in the morning and at bedtime.  11/24/19   [provider]  RIVAROXABAN Carlena Hurl) VTE STARTER PACK (15 & 20 MG) Follow package directions: Take one 15mg  tablet by mouth twice a day. On day 22, switch to one 20mg  tablet once a day. Take with food. 06/03/20   Quinlan Mcfall, , MD  topiramate (TOPAMAX) 50 MG tablet Take 50 mg by mouth at bedtime.  11/02/19   [provider]  traZODone (DESYREL) 100 MG tablet Take 100 mg by mouth at bedtime.     [provider]  venlafaxine XR (EFFEXOR-XR) 150 MG 24 hr capsule Take 150 mg by mouth daily.  11/02/19   [provider]    Allergies    Prednisone, Heparin, Imdur [isosorbide nitrate], Nitroglycerin, Paroxetine hcl, Perphenazine, Zoloft [sertraline hcl], and Zolpidem  Review of  Systems   Review of Systems  All other systems reviewed and are negative.   Physical Exam Updated Vital Signs BP 119/60 (BP Location: Left Arm)   Pulse 76   Temp 98 F (36.7 C) (Oral)   Resp 17   SpO2 100%   Physical Exam Vitals and nursing note reviewed.  Constitutional:      Appearance: She is well-developed and well-nourished.  HENT:     Head: Normocephalic and atraumatic.     Nose: No congestion or rhinorrhea.     Mouth/Throat:     Mouth: Mucous membranes are moist.     Pharynx: Oropharynx is clear.  Eyes:     Pupils: Pupils are equal, round, and reactive to light.  Cardiovascular:     Rate and Rhythm: Normal rate and regular rhythm.  Pulmonary:     Effort: No respiratory distress.     Breath sounds: No stridor.  Abdominal:     General: There is no distension.  Musculoskeletal:        General: Swelling (LLE with mild edema) present. No tenderness. Normal range of motion.     Cervical back: Normal range of motion.  Skin:    General: Skin is warm and dry.     Coloration: Skin is not jaundiced.  Neurological:     General: No focal deficit present.     Mental Status: She is alert.     ED Results / Procedures / Treatments   Labs (all labs ordered are listed, but only abnormal results are displayed) Labs Reviewed - No data to display  EKG None  Radiology No results found.  Procedures Procedures (including critical care time)  Medications Ordered in ED Medications  Rivaroxaban (XARELTO) tablet 15 mg (15 mg Oral Given 06/03/20 0407)  oxyCODONE-acetaminophen (PERCOCET/ROXICET) 5-325 MG per tablet 2 tablet (2 tablets Oral Given 06/03/20 0407)    ED Course  I have reviewed the triage vital signs and the nursing notes.  Pertinent labs & imaging results that were available during my care of the patient were reviewed  by me and considered in my medical decision making (see chart for details).    MDM Rules/Calculators/A&P                          Patient  with edema of her left leg and posterior pain could be just edema from postoperative injury and healing however very well could be a DVT.  Cannot do an ultrasound this time patient elects to take a blood thinner and get ultrasound later in the day or tomorrow.  Will follow up with her orthopedic soon.  We will not take her blood thinners unless her test is positive.  No chest pain shortness of breath indicate need for CT.  Final Clinical Impression(s) / ED Diagnoses Final diagnoses:  Leg edema    Rx / DC Orders ED Discharge Orders         Ordered    VAS [place] LOWER EXTREMITY VENOUS (DVT)        06/03/20 0321    RIVAROXABAN (XARELTO) VTE STARTER PACK (15 & 20 MG)        06/03/20 0322           Heberto Sturdevant, Corene Cornea, MD 06/03/20 [phone]

## 2020-06-03 NOTE — Discharge Instructions (Signed)
Only fill your prescription for blood thinner if your DVT test is positive. If negative, continue with plan of care set forth by your surgeon.

## 2020-06-05 DIAGNOSIS — I2510 Atherosclerotic heart disease of native coronary artery without angina pectoris: Secondary | ICD-10-CM | POA: Diagnosis not present

## 2020-06-05 DIAGNOSIS — E119 Type 2 diabetes mellitus without complications: Secondary | ICD-10-CM | POA: Diagnosis not present

## 2020-06-05 DIAGNOSIS — E039 Hypothyroidism, unspecified: Secondary | ICD-10-CM | POA: Diagnosis not present

## 2020-06-05 DIAGNOSIS — Z0000 Encounter for general adult medical examination without abnormal findings: Secondary | ICD-10-CM | POA: Diagnosis not present

## 2020-06-05 DIAGNOSIS — F411 Generalized anxiety disorder: Secondary | ICD-10-CM | POA: Diagnosis not present

## 2020-06-05 DIAGNOSIS — Z471 Aftercare following joint replacement surgery: Secondary | ICD-10-CM | POA: Diagnosis not present

## 2020-06-05 DIAGNOSIS — J449 Chronic obstructive pulmonary disease, unspecified: Secondary | ICD-10-CM | POA: Diagnosis not present

## 2020-06-05 DIAGNOSIS — E785 Hyperlipidemia, unspecified: Secondary | ICD-10-CM | POA: Diagnosis not present

## 2020-06-05 DIAGNOSIS — F32A Depression, unspecified: Secondary | ICD-10-CM | POA: Diagnosis not present

## 2020-06-05 DIAGNOSIS — M1990 Unspecified osteoarthritis, unspecified site: Secondary | ICD-10-CM | POA: Diagnosis not present

## 2020-06-05 DIAGNOSIS — Z1389 Encounter for screening for other disorder: Secondary | ICD-10-CM | POA: Diagnosis not present

## 2020-06-05 DIAGNOSIS — Z6838 Body mass index (BMI) 38.0-38.9, adult: Secondary | ICD-10-CM | POA: Diagnosis not present

## 2020-06-05 DIAGNOSIS — I10 Essential (primary) hypertension: Secondary | ICD-10-CM | POA: Diagnosis not present

## 2020-06-05 DIAGNOSIS — K2270 Barrett's esophagus without dysplasia: Secondary | ICD-10-CM | POA: Diagnosis not present

## 2020-06-05 DIAGNOSIS — J45909 Unspecified asthma, uncomplicated: Secondary | ICD-10-CM | POA: Diagnosis not present

## 2020-06-05 DIAGNOSIS — I252 Old myocardial infarction: Secondary | ICD-10-CM | POA: Diagnosis not present

## 2020-06-27 DIAGNOSIS — F314 Bipolar disorder, current episode depressed, severe, without psychotic features: Secondary | ICD-10-CM | POA: Diagnosis not present

## 2020-07-09 DIAGNOSIS — Z471 Aftercare following joint replacement surgery: Secondary | ICD-10-CM | POA: Diagnosis not present

## 2020-07-09 DIAGNOSIS — M1712 Unilateral primary osteoarthritis, left knee: Secondary | ICD-10-CM | POA: Diagnosis not present

## 2020-07-09 DIAGNOSIS — Z96642 Presence of left artificial hip joint: Secondary | ICD-10-CM | POA: Diagnosis not present

## 2020-07-29 DIAGNOSIS — F314 Bipolar disorder, current episode depressed, severe, without psychotic features: Secondary | ICD-10-CM | POA: Diagnosis not present

## 2020-08-08 DIAGNOSIS — H35352 Cystoid macular degeneration, left eye: Secondary | ICD-10-CM | POA: Diagnosis not present

## 2020-08-08 DIAGNOSIS — H35371 Puckering of macula, right eye: Secondary | ICD-10-CM | POA: Diagnosis not present

## 2020-08-11 DIAGNOSIS — Z87891 Personal history of nicotine dependence: Secondary | ICD-10-CM | POA: Diagnosis not present

## 2020-08-11 DIAGNOSIS — Z955 Presence of coronary angioplasty implant and graft: Secondary | ICD-10-CM | POA: Diagnosis not present

## 2020-08-11 DIAGNOSIS — I10 Essential (primary) hypertension: Secondary | ICD-10-CM | POA: Diagnosis not present

## 2020-08-11 DIAGNOSIS — I2510 Atherosclerotic heart disease of native coronary artery without angina pectoris: Secondary | ICD-10-CM | POA: Diagnosis not present

## 2020-08-11 DIAGNOSIS — E785 Hyperlipidemia, unspecified: Secondary | ICD-10-CM | POA: Diagnosis not present

## 2020-08-14 DIAGNOSIS — F314 Bipolar disorder, current episode depressed, severe, without psychotic features: Secondary | ICD-10-CM | POA: Diagnosis not present

## 2020-08-18 DIAGNOSIS — Z7902 Long term (current) use of antithrombotics/antiplatelets: Secondary | ICD-10-CM | POA: Diagnosis not present

## 2020-08-18 DIAGNOSIS — Z79899 Other long term (current) drug therapy: Secondary | ICD-10-CM | POA: Diagnosis not present

## 2020-08-18 DIAGNOSIS — R9431 Abnormal electrocardiogram [ECG] [EKG]: Secondary | ICD-10-CM | POA: Diagnosis not present

## 2020-08-18 DIAGNOSIS — Z7982 Long term (current) use of aspirin: Secondary | ICD-10-CM | POA: Diagnosis not present

## 2020-08-18 DIAGNOSIS — I10 Essential (primary) hypertension: Secondary | ICD-10-CM | POA: Diagnosis not present

## 2020-08-18 DIAGNOSIS — Z9114 Patient's other noncompliance with medication regimen: Secondary | ICD-10-CM | POA: Diagnosis not present

## 2020-08-18 DIAGNOSIS — R079 Chest pain, unspecified: Secondary | ICD-10-CM | POA: Diagnosis not present

## 2020-08-18 DIAGNOSIS — R072 Precordial pain: Secondary | ICD-10-CM | POA: Diagnosis not present

## 2020-08-18 DIAGNOSIS — E785 Hyperlipidemia, unspecified: Secondary | ICD-10-CM | POA: Diagnosis not present

## 2020-08-18 DIAGNOSIS — E119 Type 2 diabetes mellitus without complications: Secondary | ICD-10-CM | POA: Diagnosis not present

## 2020-08-18 DIAGNOSIS — Z87891 Personal history of nicotine dependence: Secondary | ICD-10-CM | POA: Diagnosis not present

## 2020-08-18 DIAGNOSIS — I2510 Atherosclerotic heart disease of native coronary artery without angina pectoris: Secondary | ICD-10-CM | POA: Diagnosis not present

## 2020-08-18 DIAGNOSIS — I200 Unstable angina: Secondary | ICD-10-CM | POA: Diagnosis not present

## 2020-08-18 DIAGNOSIS — F419 Anxiety disorder, unspecified: Secondary | ICD-10-CM | POA: Diagnosis not present

## 2020-08-18 DIAGNOSIS — Z955 Presence of coronary angioplasty implant and graft: Secondary | ICD-10-CM | POA: Diagnosis not present

## 2020-08-18 DIAGNOSIS — E039 Hypothyroidism, unspecified: Secondary | ICD-10-CM | POA: Diagnosis not present

## 2020-08-18 DIAGNOSIS — Z794 Long term (current) use of insulin: Secondary | ICD-10-CM | POA: Diagnosis not present

## 2020-08-18 DIAGNOSIS — K219 Gastro-esophageal reflux disease without esophagitis: Secondary | ICD-10-CM | POA: Diagnosis not present

## 2020-08-18 DIAGNOSIS — F319 Bipolar disorder, unspecified: Secondary | ICD-10-CM | POA: Diagnosis not present

## 2020-08-18 DIAGNOSIS — R0602 Shortness of breath: Secondary | ICD-10-CM | POA: Diagnosis not present

## 2020-08-18 DIAGNOSIS — T82855A Stenosis of coronary artery stent, initial encounter: Secondary | ICD-10-CM | POA: Diagnosis not present

## 2020-08-18 DIAGNOSIS — I25118 Atherosclerotic heart disease of native coronary artery with other forms of angina pectoris: Secondary | ICD-10-CM | POA: Diagnosis not present

## 2020-08-19 DIAGNOSIS — E119 Type 2 diabetes mellitus without complications: Secondary | ICD-10-CM | POA: Diagnosis not present

## 2020-08-19 DIAGNOSIS — E782 Mixed hyperlipidemia: Secondary | ICD-10-CM | POA: Diagnosis not present

## 2020-08-19 DIAGNOSIS — R0789 Other chest pain: Secondary | ICD-10-CM | POA: Diagnosis not present

## 2020-08-19 DIAGNOSIS — E039 Hypothyroidism, unspecified: Secondary | ICD-10-CM | POA: Diagnosis not present

## 2020-08-19 DIAGNOSIS — I2510 Atherosclerotic heart disease of native coronary artery without angina pectoris: Secondary | ICD-10-CM | POA: Diagnosis not present

## 2020-08-19 DIAGNOSIS — I10 Essential (primary) hypertension: Secondary | ICD-10-CM | POA: Diagnosis not present

## 2020-08-19 DIAGNOSIS — F17210 Nicotine dependence, cigarettes, uncomplicated: Secondary | ICD-10-CM | POA: Diagnosis not present

## 2020-08-19 DIAGNOSIS — Z955 Presence of coronary angioplasty implant and graft: Secondary | ICD-10-CM | POA: Diagnosis not present

## 2020-08-19 DIAGNOSIS — Z720 Tobacco use: Secondary | ICD-10-CM | POA: Diagnosis not present

## 2020-08-19 DIAGNOSIS — R079 Chest pain, unspecified: Secondary | ICD-10-CM | POA: Diagnosis not present

## 2020-08-19 DIAGNOSIS — Z794 Long term (current) use of insulin: Secondary | ICD-10-CM | POA: Diagnosis not present

## 2020-08-20 DIAGNOSIS — I25118 Atherosclerotic heart disease of native coronary artery with other forms of angina pectoris: Secondary | ICD-10-CM | POA: Diagnosis not present

## 2020-08-20 DIAGNOSIS — E119 Type 2 diabetes mellitus without complications: Secondary | ICD-10-CM | POA: Diagnosis not present

## 2020-08-20 DIAGNOSIS — I10 Essential (primary) hypertension: Secondary | ICD-10-CM | POA: Diagnosis not present

## 2020-08-20 DIAGNOSIS — E039 Hypothyroidism, unspecified: Secondary | ICD-10-CM | POA: Diagnosis not present

## 2020-08-20 DIAGNOSIS — Z794 Long term (current) use of insulin: Secondary | ICD-10-CM | POA: Diagnosis not present

## 2020-08-20 DIAGNOSIS — R072 Precordial pain: Secondary | ICD-10-CM | POA: Diagnosis not present

## 2020-08-20 DIAGNOSIS — Z955 Presence of coronary angioplasty implant and graft: Secondary | ICD-10-CM | POA: Diagnosis not present

## 2020-08-21 DIAGNOSIS — R072 Precordial pain: Secondary | ICD-10-CM | POA: Diagnosis not present

## 2020-08-21 DIAGNOSIS — I200 Unstable angina: Secondary | ICD-10-CM | POA: Diagnosis not present

## 2020-08-22 DIAGNOSIS — F314 Bipolar disorder, current episode depressed, severe, without psychotic features: Secondary | ICD-10-CM | POA: Diagnosis not present

## 2020-09-01 DIAGNOSIS — H35373 Puckering of macula, bilateral: Secondary | ICD-10-CM | POA: Diagnosis not present

## 2020-09-01 DIAGNOSIS — H2513 Age-related nuclear cataract, bilateral: Secondary | ICD-10-CM | POA: Diagnosis not present

## 2020-09-21 DIAGNOSIS — Z23 Encounter for immunization: Secondary | ICD-10-CM | POA: Diagnosis not present

## 2020-09-22 DIAGNOSIS — F314 Bipolar disorder, current episode depressed, severe, without psychotic features: Secondary | ICD-10-CM | POA: Diagnosis not present

## 2020-10-08 DIAGNOSIS — Z955 Presence of coronary angioplasty implant and graft: Secondary | ICD-10-CM | POA: Diagnosis not present

## 2020-10-08 DIAGNOSIS — I10 Essential (primary) hypertension: Secondary | ICD-10-CM | POA: Diagnosis not present

## 2020-10-08 DIAGNOSIS — F17200 Nicotine dependence, unspecified, uncomplicated: Secondary | ICD-10-CM | POA: Diagnosis not present

## 2020-10-08 DIAGNOSIS — E785 Hyperlipidemia, unspecified: Secondary | ICD-10-CM | POA: Diagnosis not present

## 2020-10-08 DIAGNOSIS — I2510 Atherosclerotic heart disease of native coronary artery without angina pectoris: Secondary | ICD-10-CM | POA: Diagnosis not present

## 2020-10-14 DIAGNOSIS — F17210 Nicotine dependence, cigarettes, uncomplicated: Secondary | ICD-10-CM | POA: Diagnosis not present

## 2020-10-14 DIAGNOSIS — E1169 Type 2 diabetes mellitus with other specified complication: Secondary | ICD-10-CM | POA: Diagnosis not present

## 2020-10-14 DIAGNOSIS — I2510 Atherosclerotic heart disease of native coronary artery without angina pectoris: Secondary | ICD-10-CM | POA: Diagnosis not present

## 2020-10-14 DIAGNOSIS — K2270 Barrett's esophagus without dysplasia: Secondary | ICD-10-CM | POA: Diagnosis not present

## 2020-10-14 DIAGNOSIS — Z6841 Body Mass Index (BMI) 40.0 and over, adult: Secondary | ICD-10-CM | POA: Diagnosis not present

## 2020-10-14 DIAGNOSIS — E785 Hyperlipidemia, unspecified: Secondary | ICD-10-CM | POA: Diagnosis not present

## 2020-10-14 DIAGNOSIS — F3181 Bipolar II disorder: Secondary | ICD-10-CM | POA: Diagnosis not present

## 2020-10-14 DIAGNOSIS — E039 Hypothyroidism, unspecified: Secondary | ICD-10-CM | POA: Diagnosis not present

## 2020-10-17 DIAGNOSIS — F314 Bipolar disorder, current episode depressed, severe, without psychotic features: Secondary | ICD-10-CM | POA: Diagnosis not present

## 2020-10-23 DIAGNOSIS — H25811 Combined forms of age-related cataract, right eye: Secondary | ICD-10-CM | POA: Diagnosis not present

## 2020-11-12 DIAGNOSIS — H2511 Age-related nuclear cataract, right eye: Secondary | ICD-10-CM | POA: Diagnosis not present

## 2020-11-12 DIAGNOSIS — H25811 Combined forms of age-related cataract, right eye: Secondary | ICD-10-CM | POA: Diagnosis not present

## 2020-11-12 HISTORY — PX: CATARACT EXTRACTION W/ INTRAOCULAR LENS IMPLANT: SHX1309

## 2020-11-13 DIAGNOSIS — H25811 Combined forms of age-related cataract, right eye: Secondary | ICD-10-CM | POA: Diagnosis not present

## 2020-11-20 DIAGNOSIS — H2512 Age-related nuclear cataract, left eye: Secondary | ICD-10-CM | POA: Diagnosis not present

## 2020-11-20 DIAGNOSIS — H25812 Combined forms of age-related cataract, left eye: Secondary | ICD-10-CM | POA: Diagnosis not present

## 2020-12-10 DIAGNOSIS — F314 Bipolar disorder, current episode depressed, severe, without psychotic features: Secondary | ICD-10-CM | POA: Diagnosis not present

## 2020-12-30 DIAGNOSIS — Z79899 Other long term (current) drug therapy: Secondary | ICD-10-CM | POA: Diagnosis not present

## 2020-12-30 DIAGNOSIS — Z1389 Encounter for screening for other disorder: Secondary | ICD-10-CM | POA: Diagnosis not present

## 2020-12-30 DIAGNOSIS — G894 Chronic pain syndrome: Secondary | ICD-10-CM | POA: Diagnosis not present

## 2020-12-30 DIAGNOSIS — M1710 Unilateral primary osteoarthritis, unspecified knee: Secondary | ICD-10-CM | POA: Diagnosis not present

## 2020-12-30 DIAGNOSIS — M5430 Sciatica, unspecified side: Secondary | ICD-10-CM | POA: Diagnosis not present

## 2020-12-30 DIAGNOSIS — G8929 Other chronic pain: Secondary | ICD-10-CM | POA: Diagnosis not present

## 2020-12-30 DIAGNOSIS — M549 Dorsalgia, unspecified: Secondary | ICD-10-CM | POA: Diagnosis not present

## 2021-01-07 DIAGNOSIS — F314 Bipolar disorder, current episode depressed, severe, without psychotic features: Secondary | ICD-10-CM | POA: Diagnosis not present

## 2021-01-18 DIAGNOSIS — R051 Acute cough: Secondary | ICD-10-CM | POA: Diagnosis not present

## 2021-01-18 DIAGNOSIS — R058 Other specified cough: Secondary | ICD-10-CM | POA: Diagnosis not present

## 2021-01-18 DIAGNOSIS — U071 COVID-19: Secondary | ICD-10-CM | POA: Diagnosis not present

## 2021-01-19 DIAGNOSIS — U071 COVID-19: Secondary | ICD-10-CM | POA: Diagnosis not present

## 2021-01-21 DIAGNOSIS — F17210 Nicotine dependence, cigarettes, uncomplicated: Secondary | ICD-10-CM | POA: Diagnosis not present

## 2021-01-21 DIAGNOSIS — J208 Acute bronchitis due to other specified organisms: Secondary | ICD-10-CM | POA: Diagnosis not present

## 2021-01-21 DIAGNOSIS — U071 COVID-19: Secondary | ICD-10-CM | POA: Diagnosis not present

## 2021-03-12 DIAGNOSIS — F332 Major depressive disorder, recurrent severe without psychotic features: Secondary | ICD-10-CM | POA: Diagnosis not present

## 2021-03-18 DIAGNOSIS — F314 Bipolar disorder, current episode depressed, severe, without psychotic features: Secondary | ICD-10-CM | POA: Diagnosis not present

## 2021-03-20 DIAGNOSIS — Z23 Encounter for immunization: Secondary | ICD-10-CM | POA: Diagnosis not present

## 2021-03-24 DIAGNOSIS — M7989 Other specified soft tissue disorders: Secondary | ICD-10-CM | POA: Diagnosis not present

## 2021-03-24 DIAGNOSIS — R600 Localized edema: Secondary | ICD-10-CM | POA: Diagnosis not present

## 2021-03-24 DIAGNOSIS — S92354A Nondisplaced fracture of fifth metatarsal bone, right foot, initial encounter for closed fracture: Secondary | ICD-10-CM | POA: Diagnosis not present

## 2021-03-24 DIAGNOSIS — M7731 Calcaneal spur, right foot: Secondary | ICD-10-CM | POA: Diagnosis not present

## 2021-03-25 ENCOUNTER — Ambulatory Visit (INDEPENDENT_AMBULATORY_CARE_PROVIDER_SITE_OTHER): Payer: Medicare Other | Admitting: Sports Medicine

## 2021-03-25 ENCOUNTER — Other Ambulatory Visit: Payer: Self-pay

## 2021-03-25 ENCOUNTER — Encounter: Payer: Self-pay | Admitting: Sports Medicine

## 2021-03-25 DIAGNOSIS — S92354A Nondisplaced fracture of fifth metatarsal bone, right foot, initial encounter for closed fracture: Secondary | ICD-10-CM

## 2021-03-25 DIAGNOSIS — M7989 Other specified soft tissue disorders: Secondary | ICD-10-CM | POA: Diagnosis not present

## 2021-03-25 DIAGNOSIS — M79671 Pain in right foot: Secondary | ICD-10-CM

## 2021-03-25 DIAGNOSIS — W19XXXA Unspecified fall, initial encounter: Secondary | ICD-10-CM

## 2021-03-25 NOTE — Progress Notes (Signed)
Subjective: Kara Lamb is a 65 y.o. female patient who presents to office for evaluation of Right foot pain secondary to fracture was seen on last night after falling and injuring the right foot.  Patient was seen at [facility] ER and diagnosed with a nondisplaced fifth metatarsal fracture patient was put in a splint and made nonweightbearing however due to other orthopedic issues cannot use crutches and is wondering if there is other ways that we can immobilize her instead of a cast or splint.  Patient denies any other pedal complaints.   Patient Active Problem List   Diagnosis Date Noted   S/P left THA, AA 05/20/2020   Left hip OA 05/20/2020   S/P hip replacement, left 05/20/2020   FUO (fever of unknown origin) 11/15/2019   Abnormal CT lung screening 11/20/2014   Midsternal chest pain 09/17/2014   Multiple drug overdose    Suicide attempt by drug ingestion ([place])    Chest pain, negative MI 05/11/2014   Tobacco use disorder 01/22/2014   Severe obesity (BMI >= 40) ([place]) 01/22/2014   Abnormal myocardial perfusion study 07/25/2013   CAD (coronary artery disease)    Chronic chest pain    H/O: suicide attempt    Obesity BMI 39    GERD (gastroesophageal reflux disease)    Asthmatic bronchitis , chronic ([place]) 07/05/2013   Severe major depression without psychotic features ([place]) 03/01/2013   GAD (generalized anxiety disorder) 03/01/2013   Borderline behavior 06/16/2011   Bipolar 1 disorder ([place]) 11/30/2010   Dyslipidemia 08/21/2008   HYPERTENSION, BENIGN 08/21/2008    Current Outpatient Medications on File Prior to Visit  Medication Sig Dispense Refill   albuterol (VENTOLIN HFA) 108 (90 Base) MCG/ACT inhaler Inhale 2 puffs into the lungs every 4 (four) hours as needed.     amLODipine (NORVASC) 2.5 MG tablet 2.5 mg.     ARIPiprazole (ABILIFY) 10 MG tablet 15 mg.     aspirin 81 MG chewable tablet 81 mg.     atorvastatin (LIPITOR) 40 MG tablet Take by mouth.     clopidogrel (PLAVIX) 75  MG tablet Take by mouth.     furosemide (LASIX) 20 MG tablet TAKE 1 TABLET (20 MG TOTAL) BY MOUTH DAILY AS NEEDED (FOR SWELLING).     lamoTRIgine (LAMICTAL) 200 MG tablet Take by mouth.     levothyroxine (SYNTHROID) 88 MCG tablet Take by mouth.     losartan (COZAAR) 50 MG tablet 50 mg.     metFORMIN (GLUCOPHAGE) 500 MG tablet 500 mg.     metoprolol succinate (TOPROL-XL) 25 MG 24 hr tablet Take 1 tablet by mouth daily.     nitroGLYCERIN (NITROSTAT) 0.4 MG SL tablet Place 1 tablet under the tongue every 5 (five) minutes as needed.     olmesartan (BENICAR) 5 MG tablet Take by mouth.     ondansetron (ZOFRAN-ODT) 4 MG disintegrating tablet 4 mg.     oxyCODONE-acetaminophen (PERCOCET/ROXICET) 5-325 MG tablet 1 tablet.     pantoprazole (PROTONIX) 40 MG tablet Take by mouth.     risperiDONE (RISPERDAL) 1 MG tablet Take by mouth.     tamsulosin (FLOMAX) 0.4 MG CAPS capsule 0.4 mg.     topiramate (TOPAMAX) 25 MG tablet Take by mouth.     aspirin EC 81 MG tablet Take 1 tablet (81 mg total) by mouth every morning. For heart health     atorvastatin (LIPITOR) 40 MG tablet Take 1 tablet (40 mg total) by mouth daily at 6 PM. NEEDS APPOINTMENT  FOR FUTURE REFILLS (Patient taking differently: Take 40 mg by mouth daily after supper. NEEDS APPOINTMENT FOR FUTURE REFILLS) 30 tablet 0   clopidogrel (PLAVIX) 75 MG tablet Take 1 tablet (75 mg total) by mouth daily. NEEDS APPOINTMENT FOR FUTURE REFILLS OR 90-DAY SUPPLY (Patient taking differently: Take 75 mg by mouth daily after supper. NEEDS APPOINTMENT FOR FUTURE REFILLS OR 90-DAY SUPPLY) 30 tablet 0   [facility] NICOTINE POLACRILEX 4 MG lozenge TAKE 1 LOZENGE BY MOUTH AS NEEDED FOR SMOKING CESSATION (Patient taking differently: Take 4 mg by mouth as needed for smoking cessation. ) 135 lozenge 4   diazepam (VALIUM) 5 MG tablet Take 5 mg by mouth daily as needed (anxiety).      docusate sodium (COLACE) 100 MG capsule Take 1 capsule (100 mg total) by mouth 2 (two) times daily.  28 capsule 0   Dulaglutide (TRULICITY) 5.03 TW/6.5KC SOPN Inject 0.75 mg into the skin every Thursday.      ferrous sulfate (FERROUSUL) 325 (65 FE) MG tablet Take 1 tablet (325 mg total) by mouth 3 (three) times daily with meals for 14 days. 42 tablet 0   HYDROcodone-acetaminophen (NORCO/VICODIN) 5-325 MG tablet Take 1 tablet by mouth every 6 (six) hours as needed.     ketorolac (ACULAR) 0.5 % ophthalmic solution      lamoTRIgine (LAMICTAL) 200 MG tablet Take 200 mg by mouth daily.     levothyroxine (SYNTHROID) 88 MCG tablet Take 88 mcg by mouth daily before breakfast.     methocarbamol (ROBAXIN) 500 MG tablet Take 1 tablet (500 mg total) by mouth every 6 (six) hours as needed for muscle spasms. 40 tablet 0   metoprolol tartrate (LOPRESSOR) 25 MG tablet Take 1 tablet (25 mg total) by mouth 2 (two) times daily. NEEDS APPOINTMENT FOR FUTURE REFILLS OR 90-DAY SUPPLY (Patient taking differently: Take 25 mg by mouth daily. ) 60 tablet 0   Multiple Vitamin (MULTI-VITAMIN) tablet Take 1 tablet by mouth every morning.     Multiple Vitamin (MULTIVITAMIN WITH MINERALS) TABS tablet Take 1 tablet by mouth daily.     nicotine (NICODERM CQ - DOSED IN MG/24 HOURS) 14 mg/24hr patch Place onto the skin.     nitroGLYCERIN (NITROSTAT) 0.4 MG SL tablet Place 1 tablet (0.4 mg total) under the tongue every 5 (five) minutes as needed for chest pain. (Patient taking differently: Place 0.4 mg under the tongue every 5 (five) minutes x 3 doses as needed for chest pain. ) 25 tablet 3   ofloxacin (OCUFLOX) 0.3 % ophthalmic solution      olmesartan (BENICAR) 5 MG tablet Take 5 mg by mouth daily after breakfast.     oxyCODONE (OXY IR/ROXICODONE) 5 MG immediate release tablet Take 1-2 tablets (5-10 mg total) by mouth every 6 (six) hours as needed for moderate pain or severe pain. 40 tablet 0   pantoprazole (PROTONIX) 40 MG tablet Take 40 mg by mouth daily before breakfast.      polyethylene glycol (MIRALAX / GLYCOLAX) 17 g packet  Take 17 g by mouth 2 (two) times daily. 28 packet 0   prazosin (MINIPRESS) 1 MG capsule Take 2 mg by mouth at bedtime.      prednisoLONE acetate (PRED FORTE) 1 % ophthalmic suspension      ranolazine (RANEXA) 500 MG 12 hr tablet Take 500 mg by mouth in the morning and at bedtime.      RIVAROXABAN (XARELTO) VTE STARTER PACK (15 & 20 MG) Follow package directions: Take one 15mg  tablet  by mouth twice a day. On day 22, switch to one 20mg  tablet once a day. Take with food. 51 each 0   topiramate (TOPAMAX) 50 MG tablet Take 50 mg by mouth at bedtime.      traZODone (DESYREL) 100 MG tablet Take 100 mg by mouth at bedtime.      venlafaxine XR (EFFEXOR-XR) 150 MG 24 hr capsule Take 150 mg by mouth daily.      No current facility-administered medications on file prior to visit.    Allergies  Allergen Reactions   Prednisone Other (See Comments)    Mood changes/bloating   Heparin Other (See Comments)    Injections in the stomach causes huge knots, tenderness, and pain for weeks (hematomas)   Imdur [Isosorbide Nitrate]     Low blood pressure   Nitroglycerin Nausea Only    Patches--severe headache Drip--nausea and vomiting    Paroxetine Hcl Diarrhea    Severe   Perphenazine Other (See Comments)    Cannot recall    Zoloft [Sertraline Hcl]     Severe diarrhea   Zolpidem Other (See Comments)    Sleep walking and loss of memory     Objective:  General: Alert and oriented x3 in no acute distress  Dermatology: No open lesions bilateral lower extremities, no webspace macerations, no ecchymosis bilateral, all nails x 10 are well manicured.  Vascular: Focal edema noted to right lateal foot, dorsalis Pedis and Posterior Tibial pedal pulses 2/4, Capillary Fill Time 3 seconds,(+) pedal hair growth bilateral, Temperature gradient within normal limits.  Neurology: Johney Maine sensation intact via light touch bilateral.  Musculoskeletal: There is tenderness with palpation at fifth metatarsal shaft on the  right foot at area of fracture.  There is guarding due to pain on right foot and ankle.  Assessment and Plan: Problem List Items Addressed This Visit   None Visit Diagnoses     Closed nondisplaced fracture of fifth metatarsal bone of right foot, initial encounter    -  Primary   Right foot pain       Swelling of right foot       Fall, initial encounter            -Complete examination performed -Xrays reviewed from [facility] -Discussed treatement options for fracture; risks, alternatives, and benefits explained. -Applied Surgigrip compression sleeve to right foot and ankle and instructed patient on proper use -Dispensed CAM walker to patient to wear at all times and instructed on use -Advised limited activity and to get knee scooter from [employer] supply or can use Cane or walker to help with using CAM boot. -Advised patient to refrain from excessively taking the boot on and off and advise her that the boot should stay on as much as possible to prevent worsening of fracture -Recommend protection, rest, ice, elevation daily until symptoms improve -Advised patient to continue with Lortab as prescribed by ER and discussed with patient that she is only eligible for a few doses of pain medicine and due to past history of Substance abuse -Patient to return to office in 4 weeks for serial x-rays to assess healing  or sooner if condition worsens.  Landis Martins, DPM

## 2021-03-26 ENCOUNTER — Encounter: Payer: Self-pay | Admitting: Sports Medicine

## 2021-03-26 ENCOUNTER — Telehealth: Payer: Self-pay | Admitting: Sports Medicine

## 2021-03-26 NOTE — Telephone Encounter (Signed)
Patient would like to get a disability placard. Please call patient when it is ready for pick up.
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
## 2021-03-26 NOTE — Progress Notes (Signed)
Email or send copy of letter to patient

## 2021-03-27 ENCOUNTER — Telehealth: Payer: Self-pay | Admitting: Sports Medicine

## 2021-03-27 ENCOUNTER — Other Ambulatory Visit: Payer: Self-pay | Admitting: Sports Medicine

## 2021-03-27 MED ORDER — HYDROCODONE-ACETAMINOPHEN 10-325 MG PO TABS: 1.0000 | ORAL_TABLET | Freq: Three times a day (TID) | ORAL | 0 refills | Status: AC | PRN

## 2021-03-27 NOTE — Telephone Encounter (Signed)
Pt is currently at pharmacy to pick up prescription and insurance isn't covering it without a physicians authorization explaining that you are changing the prescription the ER doctor ordered. Thanks

## 2021-03-27 NOTE — Telephone Encounter (Signed)
Patient called the office and stating not to worry about the physicians authorization. She states she will just pay for the medication out of pocket , while using the  [facility] card.

## 2021-03-27 NOTE — Progress Notes (Signed)
Refilled pain medication

## 2021-03-27 NOTE — Telephone Encounter (Signed)
Called pt back to see what was going on. Pt states she was having dosage issues since her ER doctor gave her hydrocodone and Dr. Cannon Kettle increase the dosage they wanted an authorization from the physician to dispense. Pt states she was able to pay out of pocket ($9.18) w/o the authorization, but can get reimburse

## 2021-03-27 NOTE — Telephone Encounter (Signed)
Patient called office this morning to request refill of pain medication.

## 2021-04-01 ENCOUNTER — Telehealth: Payer: Self-pay | Admitting: Sports Medicine

## 2021-04-01 NOTE — Telephone Encounter (Signed)
Patient returned [name]'s phone call.  Please call

## 2021-04-02 DIAGNOSIS — F332 Major depressive disorder, recurrent severe without psychotic features: Secondary | ICD-10-CM | POA: Diagnosis not present

## 2021-04-02 NOTE — Telephone Encounter (Signed)
Called pt back and LVM to return phone call

## 2021-04-02 NOTE — Telephone Encounter (Signed)
Patient returned Kara Lamb's phone call.   Please call.Marland KitchenMarland KitchenMarland Kitchen

## 2021-04-07 NOTE — Telephone Encounter (Signed)
LVM to pt stating that I was returning her call and if she could return my phone call to discuss issue

## 2021-04-17 ENCOUNTER — Telehealth: Payer: Self-pay | Admitting: *Deleted

## 2021-04-17 NOTE — Telephone Encounter (Signed)
Patient is calling because she was seen on the 12 th and progress notes , next to last paragraph, spoke with doctor and felt that everything was made up in  note never had a substance abuse problem . Please call to discuss and or  have information removed.

## 2021-04-20 DIAGNOSIS — F314 Bipolar disorder, current episode depressed, severe, without psychotic features: Secondary | ICD-10-CM | POA: Diagnosis not present

## 2021-04-21 DIAGNOSIS — F332 Major depressive disorder, recurrent severe without psychotic features: Secondary | ICD-10-CM | POA: Diagnosis not present

## 2021-04-22 ENCOUNTER — Ambulatory Visit: Payer: Medicare Other | Admitting: Sports Medicine

## 2021-05-06 DIAGNOSIS — S92354A Nondisplaced fracture of fifth metatarsal bone, right foot, initial encounter for closed fracture: Secondary | ICD-10-CM | POA: Diagnosis not present

## 2021-05-06 DIAGNOSIS — F17200 Nicotine dependence, unspecified, uncomplicated: Secondary | ICD-10-CM | POA: Diagnosis not present

## 2021-05-06 DIAGNOSIS — M79671 Pain in right foot: Secondary | ICD-10-CM | POA: Diagnosis not present

## 2021-05-12 DIAGNOSIS — M545 Low back pain, unspecified: Secondary | ICD-10-CM | POA: Diagnosis not present

## 2021-05-12 DIAGNOSIS — M47816 Spondylosis without myelopathy or radiculopathy, lumbar region: Secondary | ICD-10-CM | POA: Diagnosis not present

## 2021-05-12 DIAGNOSIS — I700 Atherosclerosis of aorta: Secondary | ICD-10-CM | POA: Diagnosis not present

## 2021-05-12 DIAGNOSIS — Z1231 Encounter for screening mammogram for malignant neoplasm of breast: Secondary | ICD-10-CM | POA: Diagnosis not present

## 2021-05-14 DIAGNOSIS — F332 Major depressive disorder, recurrent severe without psychotic features: Secondary | ICD-10-CM | POA: Diagnosis not present

## 2021-05-19 DIAGNOSIS — J449 Chronic obstructive pulmonary disease, unspecified: Secondary | ICD-10-CM | POA: Diagnosis not present

## 2021-05-19 DIAGNOSIS — Z0000 Encounter for general adult medical examination without abnormal findings: Secondary | ICD-10-CM | POA: Diagnosis not present

## 2021-05-19 DIAGNOSIS — F17210 Nicotine dependence, cigarettes, uncomplicated: Secondary | ICD-10-CM | POA: Diagnosis not present

## 2021-05-19 DIAGNOSIS — E785 Hyperlipidemia, unspecified: Secondary | ICD-10-CM | POA: Diagnosis not present

## 2021-05-19 DIAGNOSIS — E039 Hypothyroidism, unspecified: Secondary | ICD-10-CM | POA: Diagnosis not present

## 2021-05-19 DIAGNOSIS — E1169 Type 2 diabetes mellitus with other specified complication: Secondary | ICD-10-CM | POA: Diagnosis not present

## 2021-05-19 DIAGNOSIS — J410 Simple chronic bronchitis: Secondary | ICD-10-CM | POA: Diagnosis not present

## 2021-05-19 DIAGNOSIS — M26622 Arthralgia of left temporomandibular joint: Secondary | ICD-10-CM | POA: Diagnosis not present

## 2021-05-19 DIAGNOSIS — Z6841 Body Mass Index (BMI) 40.0 and over, adult: Secondary | ICD-10-CM | POA: Diagnosis not present

## 2021-05-20 DIAGNOSIS — M7062 Trochanteric bursitis, left hip: Secondary | ICD-10-CM | POA: Diagnosis not present

## 2021-05-20 DIAGNOSIS — M7061 Trochanteric bursitis, right hip: Secondary | ICD-10-CM | POA: Diagnosis not present

## 2021-05-20 DIAGNOSIS — M1712 Unilateral primary osteoarthritis, left knee: Secondary | ICD-10-CM | POA: Diagnosis not present

## 2021-05-21 DIAGNOSIS — F314 Bipolar disorder, current episode depressed, severe, without psychotic features: Secondary | ICD-10-CM | POA: Diagnosis not present

## 2021-06-04 DIAGNOSIS — F332 Major depressive disorder, recurrent severe without psychotic features: Secondary | ICD-10-CM | POA: Diagnosis not present

## 2021-11-05 ENCOUNTER — Telehealth: Payer: Self-pay | Admitting: Orthopaedic Surgery

## 2021-11-05 NOTE — Telephone Encounter (Signed)
Received vm from patient wanting to get copy of 2013 medical records [facility]). IC,lmvm advised need to sign authorization form. Once we received auth, will call when copy is ready.

## 2022-02-05 NOTE — Progress Notes (Signed)
Anesthesia Review:  PCP: 11/17/21- Kara Lamb- LOV  Cardiologist : DR Fuller Canada - 07/13/21- LOV.  Chest x-ray : EKG : Echo : Stress test: Cardiac Cath :  Activity level:  Sleep Study/ CPAP : Fasting Blood Sugar :      / Checks Blood Sugar -- times a day:   Blood Thinner/ Instructions /Last Dose: ASA / Instructions/ Last Dose :   Xarelto Plavix  81 mg aspirin  DM- type  Hgba1c-

## 2022-02-05 NOTE — Patient Instructions (Signed)
SURGICAL WAITING ROOM VISITATION Patients having surgery or a procedure may have no more than 2 support people in the waiting area - these visitors may rotate.   Children under the age of 36 must have an adult with them who is not the patient. If the patient needs to stay at the hospital during part of their recovery, the visitor guidelines for inpatient rooms apply. Pre-op nurse will coordinate an appropriate time for 1 support person to accompany patient in pre-op.  This support person may not rotate.    Please refer to the [facility] website for the visitor guidelines for Inpatients (after your surgery is over and you are in a regular room).       Your procedure is scheduled on:  02/19/2022   Report to [facility] Main Entrance    Report to admitting at   [place] AM   Call this number if you have problems the morning of surgery [phone]   Do not eat food :After Midnight.   After Midnight you may have the following liquids until ___ 0415___ Am  DAY OF SURGERY  Water Non-Citrus Juices (without pulp, NO RED) Carbonated Beverages Black Coffee (NO MILK/CREAM OR CREAMERS, sugar ok)  Clear Tea (NO MILK/CREAM OR CREAMERS, sugar ok) regular and decaf                             Plain Jell-O (NO RED)                                           Fruit ices (not with fruit pulp, NO RED)                                     Popsicles (NO RED)                                                               Sports drinks like Gatorade (NO RED)                    The day of surgery:  Drink ONE (1) Pre-Surgery Clear Ensure or G2 at  0415AM  ( have completed by ) the morning of surgery. Drink in one sitting. Do not sip.  This drink was given to you during your hospital  pre-op appointment visit. Nothing else to drink after completing the  Pre-Surgery Clear Ensure or G2.    Oral Hygiene is also important to reduce your risk of infection.                                    Remember -  BRUSH YOUR TEETH THE MORNING OF SURGERY WITH YOUR REGULAR TOOTHPASTE   Do NOT smoke after Midnight   Take these medicines the morning of surgery with A SIP OF WATER:    DO NOT TAKE ANY ORAL DIABETIC MEDICATIONS DAY OF YOUR SURGERY  Bring CPAP mask and tubing day of surgery.  You may not have any metal on your body including hair pins, jewelry, and body piercing             Do not wear make-up, lotions, powders, perfumes/cologne, or deodorant  Do not wear nail polish including gel and S&S, artificial/acrylic nails, or any other type of covering on natural nails including finger and toenails. If you have artificial nails, gel coating, etc. that needs to be removed by a nail salon please have this removed prior to surgery or surgery may need to be canceled/ delayed if the surgeon/ anesthesia feels like they are unable to be safely monitored.   Do not shave  48 hours prior to surgery.               Men may shave face and neck.   Do not bring valuables to the hospital. [place].   Contacts, dentures or bridgework may not be worn into surgery.   Bring small overnight bag day of surgery.   DO NOT [place]. PHARMACY WILL DISPENSE MEDICATIONS LISTED ON YOUR MEDICATION LIST TO YOU DURING YOUR ADMISSION [place]!    Patients discharged on the day of surgery will not be allowed to drive home.  Someone NEEDS to stay with you for the first 24 hours after anesthesia.   Special Instructions: Bring a copy of your healthcare power of attorney and living will documents         the day of surgery if you haven't scanned them before.              Please read over the following fact sheets you were given: IF YOU HAVE QUESTIONS ABOUT YOUR PRE-OP INSTRUCTIONS PLEASE CALL [phone]     [facility] - Preparing for Surgery Before surgery, you can play an important role.  Because skin is  not sterile, your skin needs to be as free of germs as possible.  You can reduce the number of germs on your skin by washing with CHG (chlorahexidine gluconate) soap before surgery.  CHG is an antiseptic cleaner which kills germs and bonds with the skin to continue killing germs even after washing. Please DO NOT use if you have an allergy to CHG or antibacterial soaps.  If your skin becomes reddened/irritated stop using the CHG and inform your nurse when you arrive at Short Stay. Do not shave (including legs and underarms) for at least 48 hours prior to the first CHG shower.  You may shave your face/neck. Please follow these instructions carefully:  1.  Shower with CHG Soap the night before surgery and the  morning of Surgery.  2.  If you choose to wash your hair, wash your hair first as usual with your  normal  shampoo.  3.  After you shampoo, rinse your hair and body thoroughly to remove the  shampoo.                           4.  Use CHG as you would any other liquid soap.  You can apply chg directly  to the skin and wash                       Gently with a scrungie or clean washcloth.  5.  Apply the CHG Soap to your  body ONLY FROM THE NECK DOWN.   Do not use on face/ open                           Wound or open sores. Avoid contact with eyes, ears mouth and genitals (private parts).                       Wash face,  Genitals (private parts) with your normal soap.             6.  Wash thoroughly, paying special attention to the area where your surgery  will be performed.  7.  Thoroughly rinse your body with warm water from the neck down.  8.  DO NOT shower/wash with your normal soap after using and rinsing off  the CHG Soap.                9.  Pat yourself dry with a clean towel.            10.  Wear clean pajamas.            11.  Place clean sheets on your bed the night of your first shower and do not  sleep with pets. Day of Surgery : Do not apply any lotions/deodorants the morning of surgery.   Please wear clean clothes to the hospital/surgery center.  FAILURE TO FOLLOW THESE INSTRUCTIONS MAY RESULT IN THE CANCELLATION OF YOUR SURGERY PATIENT SIGNATURE_________________________________  NURSE SIGNATURE__________________________________  ________________________________________________________________________

## 2022-02-09 ENCOUNTER — Other Ambulatory Visit (HOSPITAL_COMMUNITY): Payer: Self-pay

## 2022-02-10 ENCOUNTER — Encounter (HOSPITAL_COMMUNITY)
Admission: RE | Admit: 2022-02-10 | Discharge: 2022-02-10 | Disposition: A | Discharge status: Home / Self Care | Payer: Medicare Other | Source: Ambulatory Visit | Attending: Anesthesiology | Admitting: Anesthesiology

## 2022-02-10 DIAGNOSIS — Z01818 Encounter for other preprocedural examination: Secondary | ICD-10-CM

## 2022-02-11 NOTE — Patient Instructions (Addendum)
DUE TO COVID-19 ONLY TWO VISITORS  (aged 66 and older)  ARE ALLOWED TO COME WITH YOU AND STAY IN THE WAITING ROOM ONLY DURING PRE OP AND PROCEDURE.   **NO VISITORS ARE ALLOWED IN THE SHORT STAY AREA OR RECOVERY ROOM!!**  IF YOU WILL BE ADMITTED INTO THE HOSPITAL YOU ARE ALLOWED ONLY FOUR SUPPORT PEOPLE DURING VISITATION HOURS ONLY (7 AM -8PM)   The support person(s) must pass our screening, gel in and out, and wear a mask at all times, including in the patient's room. Patients must also wear a mask when staff or their support person are in the room. Visitors GUEST BADGE MUST BE WORN VISIBLY  One adult visitor may remain with you overnight and MUST be in the room by 8 P.M.     Your procedure is scheduled on: 02/19/22   Report to [facility] Main Entrance    Report to admitting at : 5:15 AM   Call this number if you have problems the morning of surgery [phone]   Do not eat food :After Midnight.   After Midnight you may have the following liquids until : 4:15 AM DAY OF SURGERY  Water Black Coffee (sugar ok, NO MILK/CREAM OR CREAMERS)  Tea (sugar ok, NO MILK/CREAM OR CREAMERS) regular and decaf                             Plain Jell-O (NO RED)                                           Fruit ices (not with fruit pulp, NO RED)                                     Popsicles (NO RED)                                                                  Juice: apple, WHITE grape, WHITE cranberry Sports drinks like Gatorade (NO RED)              Drink Ensure drink AT : 4:00 AM the day of surgery.     The day of surgery:  Drink ONE (1) Pre-Surgery Clear Ensure or G2 at AM the morning of surgery. Drink in one sitting. Do not sip.  This drink was given to you during your hospital  pre-op appointment visit. Nothing else to drink after completing the  Pre-Surgery Clear Ensure or G2.          If you have questions, please contact your surgeon's office.    Oral Hygiene is also  important to reduce your risk of infection.                                    Remember - BRUSH YOUR TEETH THE MORNING OF SURGERY WITH YOUR REGULAR TOOTHPASTE   Do NOT smoke after Midnight   Take these medicines the morning of surgery with A  SIP OF WATER: ranolazine,lamotrigine,metoprolol,synthroid,panax,famotidine.Use inhalers as usual.Diazepam as needed.  DO NOT TAKE ANY ORAL DIABETIC MEDICATIONS DAY OF YOUR SURGERY  Bring CPAP mask and tubing day of surgery.                              You may not have any metal on your body including hair pins, jewelry, and body piercing             Do not wear make-up, lotions, powders, perfumes/cologne, or deodorant  Do not wear nail polish including gel and S&S, artificial/acrylic nails, or any other type of covering on natural nails including finger and toenails. If you have artificial nails, gel coating, etc. that needs to be removed by a nail salon please have this removed prior to surgery or surgery may need to be canceled/ delayed if the surgeon/ anesthesia feels like they are unable to be safely monitored.   Do not shave  48 hours prior to surgery.    Do not bring valuables to the hospital. [place].   Contacts, dentures or bridgework may not be worn into surgery.   Bring small overnight bag day of surgery.   DO NOT [place]. PHARMACY WILL DISPENSE MEDICATIONS LISTED ON YOUR MEDICATION LIST TO YOU DURING YOUR ADMISSION [place]!    Patients discharged on the day of surgery will not be allowed to drive home.  Someone NEEDS to stay with you for the first 24 hours after anesthesia.   Special Instructions: Bring a copy of your healthcare power of attorney and living will documents         the day of surgery if you haven't scanned them before.              Please read over the following fact sheets you were given: IF YOU HAVE QUESTIONS ABOUT YOUR  PRE-OP INSTRUCTIONS PLEASE CALL [phone]     [facility] - Preparing for Surgery Before surgery, you can play an important role.  Because skin is not sterile, your skin needs to be as free of germs as possible.  You can reduce the number of germs on your skin by washing with CHG (chlorahexidine gluconate) soap before surgery.  CHG is an antiseptic cleaner which kills germs and bonds with the skin to continue killing germs even after washing. Please DO NOT use if you have an allergy to CHG or antibacterial soaps.  If your skin becomes reddened/irritated stop using the CHG and inform your nurse when you arrive at Short Stay. Do not shave (including legs and underarms) for at least 48 hours prior to the first CHG shower.  You may shave your face/neck. Please follow these instructions carefully:  1.  Shower with CHG Soap the night before surgery and the  morning of Surgery.  2.  If you choose to wash your hair, wash your hair first as usual with your  normal  shampoo.  3.  After you shampoo, rinse your hair and body thoroughly to remove the  shampoo.                           4.  Use CHG as you would any other liquid soap.  You can apply chg directly  to the skin  and wash                       Gently with a scrungie or clean washcloth.  5.  Apply the CHG Soap to your body ONLY FROM THE NECK DOWN.   Do not use on face/ open                           Wound or open sores. Avoid contact with eyes, ears mouth and genitals (private parts).                       Wash face,  Genitals (private parts) with your normal soap.             6.  Wash thoroughly, paying special attention to the area where your surgery  will be performed.  7.  Thoroughly rinse your body with warm water from the neck down.  8.  DO NOT shower/wash with your normal soap after using and rinsing off  the CHG Soap.                9.  Pat yourself dry with a clean towel.            10.  Wear clean pajamas.            11.  Place clean  sheets on your bed the night of your first shower and do not  sleep with pets. Day of Surgery : Do not apply any lotions/deodorants the morning of surgery.  Please wear clean clothes to the hospital/surgery center.  FAILURE TO FOLLOW THESE INSTRUCTIONS MAY RESULT IN THE CANCELLATION OF YOUR SURGERY PATIENT SIGNATURE_________________________________  NURSE SIGNATURE__________________________________  ________________________________________________________________________ [facility]- Preparing for Total Shoulder Arthroplasty    Before surgery, you can play an important role. Because skin is not sterile, your skin needs to be as free of germs as possible. You can reduce the number of germs on your skin by using the following products. Benzoyl Peroxide Gel Reduces the number of germs present on the skin Applied twice a day to shoulder area starting two days before surgery    ==================================================================  Please follow these instructions carefully:  BENZOYL PEROXIDE 5% GEL  Please do not use if you have an allergy to benzoyl peroxide.   If your skin becomes reddened/irritated stop using the benzoyl peroxide.  Starting two days before surgery, apply as follows: Apply benzoyl peroxide in the morning and at night. Apply after taking a shower. If you are not taking a shower clean entire shoulder front, back, and side along with the armpit with a clean wet washcloth.  Place a quarter-sized dollop on your shoulder and rub in thoroughly, making sure to cover the front, back, and side of your shoulder, along with the armpit.   2 days before ____ AM   ____ PM              1 day before ____ AM   ____ PM                         Do this twice a day for two days.  (Last application is the night before surgery, AFTER using the CHG soap as described below).  Do NOT apply benzoyl peroxide gel on the day of surgery.   Incentive Spirometer  An incentive  spirometer is a tool that can help keep  your lungs clear and active. This tool measures how well you are filling your lungs with each breath. Taking long deep breaths may help reverse or decrease the chance of developing breathing (pulmonary) problems (especially infection) following: A long period of time when you are unable to move or be active. BEFORE THE PROCEDURE  If the spirometer includes an indicator to show your best effort, your nurse or respiratory therapist will set it to a desired goal. If possible, sit up straight or lean slightly forward. Try not to slouch. Hold the incentive spirometer in an upright position. INSTRUCTIONS FOR USE  Sit on the edge of your bed if possible, or sit up as far as you can in bed or on a chair. Hold the incentive spirometer in an upright position. Breathe out normally. Place the mouthpiece in your mouth and seal your lips tightly around it. Breathe in slowly and as deeply as possible, raising the piston or the ball toward the top of the column. Hold your breath for 3-5 seconds or for as long as possible. Allow the piston or ball to fall to the bottom of the column. Remove the mouthpiece from your mouth and breathe out normally. Rest for a few seconds and repeat Steps 1 through 7 at least 10 times every 1-2 hours when you are awake. Take your time and take a few normal breaths between deep breaths. The spirometer may include an indicator to show your best effort. Use the indicator as a goal to work toward during each repetition. After each set of 10 deep breaths, practice coughing to be sure your lungs are clear. If you have an incision (the cut made at the time of surgery), support your incision when coughing by placing a pillow or rolled up towels firmly against it. Once you are able to get out of bed, walk around indoors and cough well. You may stop using the incentive spirometer when instructed by your caregiver.  RISKS AND COMPLICATIONS Take your time  so you do not get dizzy or light-headed. If you are in pain, you may need to take or ask for pain medication before doing incentive spirometry. It is harder to take a deep breath if you are having pain. AFTER USE Rest and breathe slowly and easily. It can be helpful to keep track of a log of your progress. Your caregiver can provide you with a simple table to help with this. If you are using the spirometer at home, follow these instructions: [place] IF:  You are having difficultly using the spirometer. You have trouble using the spirometer as often as instructed. Your pain medication is not giving enough relief while using the spirometer. You develop fever of 100.5 F (38.1 C) or higher. SEEK IMMEDIATE MEDICAL CARE IF:  You cough up bloody sputum that had not been present before. You develop fever of 102 F (38.9 C) or greater. You develop worsening pain at or near the incision site. MAKE SURE YOU:  Understand these instructions. Will watch your condition. Will get help right away if you are not doing well or get worse. Document Released: 10/11/2006 Document Revised: 08/23/2011 Document Reviewed: 12/12/2006 [facility] Patient Information 2014 [place], [place].   ________________________________________________________________________

## 2022-02-16 ENCOUNTER — Other Ambulatory Visit: Payer: Self-pay

## 2022-02-16 ENCOUNTER — Encounter (HOSPITAL_COMMUNITY)
Admission: RE | Admit: 2022-02-16 | Discharge: 2022-02-16 | Disposition: A | Discharge status: Home / Self Care | Payer: Medicare Other | Source: Ambulatory Visit | Attending: Orthopedic Surgery | Admitting: Orthopedic Surgery

## 2022-02-16 ENCOUNTER — Encounter (HOSPITAL_COMMUNITY): Payer: Self-pay

## 2022-02-16 VITALS — BP 105/69 | HR 66 | Temp 97.8°F | Ht 60.0 in | Wt 218.0 lb

## 2022-02-16 DIAGNOSIS — Z87891 Personal history of nicotine dependence: Secondary | ICD-10-CM | POA: Insufficient documentation

## 2022-02-16 DIAGNOSIS — I2510 Atherosclerotic heart disease of native coronary artery without angina pectoris: Secondary | ICD-10-CM | POA: Insufficient documentation

## 2022-02-16 DIAGNOSIS — M19011 Primary osteoarthritis, right shoulder: Secondary | ICD-10-CM | POA: Insufficient documentation

## 2022-02-16 DIAGNOSIS — Z01818 Encounter for other preprocedural examination: Secondary | ICD-10-CM | POA: Insufficient documentation

## 2022-02-16 DIAGNOSIS — J45909 Unspecified asthma, uncomplicated: Secondary | ICD-10-CM | POA: Insufficient documentation

## 2022-02-16 DIAGNOSIS — E119 Type 2 diabetes mellitus without complications: Secondary | ICD-10-CM | POA: Insufficient documentation

## 2022-02-16 DIAGNOSIS — T50914D Poisoning by multiple unspecified drugs, medicaments and biological substances, undetermined, subsequent encounter: Secondary | ICD-10-CM | POA: Insufficient documentation

## 2022-02-16 HISTORY — DX: Angina pectoris, unspecified: I20.9

## 2022-02-16 HISTORY — DX: Type 2 diabetes mellitus without complications: E11.9

## 2022-02-16 HISTORY — DX: Irritable bowel syndrome, unspecified: K58.9

## 2022-02-16 HISTORY — DX: Hypothyroidism, unspecified: E03.9

## 2022-02-16 LAB — CBC
HCT: 44.3 % (ref 36.0–46.0)
Hemoglobin: 14.5 g/dL (ref 12.0–15.0)
MCH: 36.2 pg — ABNORMAL HIGH (ref 26.0–34.0)
MCHC: 32.7 g/dL (ref 30.0–36.0)
MCV: 110.5 fL — ABNORMAL HIGH (ref 80.0–100.0)
Platelets: 275 10*3/uL (ref 150–400)
RBC: 4.01 MIL/uL (ref 3.87–5.11)
RDW: 13.2 % (ref 11.5–15.5)
WBC: 11.8 10*3/uL — ABNORMAL HIGH (ref 4.0–10.5)
nRBC: 0 % (ref 0.0–0.2)

## 2022-02-16 LAB — COMPREHENSIVE METABOLIC PANEL
ALT: 26 U/L (ref 0–44)
AST: 31 U/L (ref 15–41)
Albumin: 4.1 g/dL (ref 3.5–5.0)
Alkaline Phosphatase: 81 U/L (ref 38–126)
Anion gap: 9 (ref 5–15)
BUN: 12 mg/dL (ref 8–23)
CO2: 28 mmol/L (ref 22–32)
Calcium: 9.5 mg/dL (ref 8.9–10.3)
Chloride: 103 mmol/L (ref 98–111)
Creatinine, Ser: 0.78 mg/dL (ref 0.44–1.00)
GFR, Estimated: >60 mL/min (ref >60)
Glucose, Bld: 165 mg/dL — ABNORMAL HIGH (ref 70–99)
Potassium: 3.6 mmol/L (ref 3.5–5.1)
Sodium: 140 mmol/L (ref 135–145)
Total Bilirubin: 0.6 mg/dL (ref 0.3–1.2)
Total Protein: 7.6 g/dL (ref 6.5–8.1)

## 2022-02-16 LAB — SURGICAL PCR SCREEN
MRSA, PCR: NEGATIVE
Staphylococcus aureus: NEGATIVE

## 2022-02-16 LAB — HEMOGLOBIN A1C
Hgb A1c MFr Bld: 7 % — ABNORMAL HIGH (ref 4.8–5.6)
Mean Plasma Glucose: 154.2 mg/dL

## 2022-02-16 LAB — GLUCOSE, CAPILLARY: Glucose-Capillary: 164 mg/dL — ABNORMAL HIGH (ref 70–99)

## 2022-02-16 NOTE — Progress Notes (Signed)
For Short Stay: [place] appointment date: Date of COVID positive in last 13 days:  Bowel Prep reminder:   For Anesthesia: PCP - 11/17/21- [facility] [place]- LOV 11/17/21 Cardiologist - DR Fuller [place] - 07/13/21- LOV  Chest x-ray -  EKG -  Stress Tes:  ECHO -  Cardiac Cath - 2008 Pacemaker/ICD device last checked: Pacemaker orders received: Device Rep notified:  Spinal Cord Stimulator:  Sleep Study - Yes CPAP - NO  Fasting Blood Sugar - N/A Checks Blood Sugar ___0__ times a day Date and result of last Hgb A1c-11/17/21: 5.7  Blood Thinner Instructions: Plavix is on hold since 02/14/22 Aspirin Instructions:  Last Dose:  Activity level: Can go up a flight of stairs and activities of daily living without stopping and without chest pain and/or shortness of breath   Able to exercise without chest pain and/or shortness of breath   Unable to go up a flight of stairs without chest pain and/or shortness of breath     Anesthesia review: Hx: MI,CAD,HTN,DIA.  Patient denies shortness of breath, fever, cough and chest pain at PAT appointment   Patient verbalized understanding of instructions that were given to them at the PAT appointment. Patient was also instructed that they will need to review over the PAT instructions again at home before surgery.

## 2022-02-17 NOTE — Anesthesia Preprocedure Evaluation (Addendum)
Anesthesia Evaluation  Patient identified by MRN, date of birth, ID band Patient awake    Reviewed: Allergy & Precautions, NPO status , Patient's Chart, lab work & pertinent test results  History of Anesthesia Complications (+) POST - OP SPINAL HEADACHE, Family history of anesthesia reaction and history of anesthetic complications  Airway Mallampati: III       Dental  (+) Missing, Chipped, Poor Dentition,    Pulmonary asthma , Patient abstained from smoking., former smoker,    Pulmonary exam normal        Cardiovascular hypertension, Pt. on medications and Pt. on home beta blockers + CAD, + Past MI and + Cardiac Stents  Normal cardiovascular exam     Neuro/Psych PSYCHIATRIC DISORDERS Anxiety Depression Bipolar Disorder negative neurological ROS     GI/Hepatic GERD  Medicated and Controlled,  Endo/Other  diabetes, Type 2Hypothyroidism   Renal/GU   negative genitourinary   Musculoskeletal  (+) Arthritis , Osteoarthritis,    Abdominal (+) + obese,   Peds  Hematology negative hematology ROS (+)   Anesthesia Other Findings   Reproductive/Obstetrics                            Anesthesia Physical Anesthesia Plan  ASA: 3  Anesthesia Plan: General   Post-op Pain Management: Regional block*   Induction: Intravenous  PONV Risk Score and Plan: 3 and Ondansetron and Midazolam  Airway Management Planned: Oral ETT  Additional Equipment: None  Intra-op Plan:   Post-operative Plan: Extubation in OR  Informed Consent: I have reviewed the patients History and Physical, chart, labs and discussed the procedure including the risks, benefits and alternatives for the proposed anesthesia with the patient or authorized representative who has indicated his/her understanding and acceptance.     Dental advisory given  Plan Discussed with: CRNA  Anesthesia Plan Comments: (See PAT note 02/16/2022)        Anesthesia Quick Evaluation                                  Anesthesia Evaluation  Patient identified by MRN, date of birth, ID band Patient awake    Reviewed: Allergy & Precautions, NPO status , Patient's Chart, lab work & pertinent test results  History of Anesthesia Complications (+) POST - OP SPINAL HEADACHE and history of anesthetic complications  Airway Mallampati: II  TM Distance: >3 FB Neck ROM: Full    Dental  (+) Missing, Chipped,    Pulmonary asthma , Current SmokerPatient did not abstain from smoking.,    Pulmonary exam normal breath sounds clear to auscultation       Cardiovascular hypertension, Pt. on home beta blockers and Pt. on medications (-) angina+ CAD, + Past MI and + Cardiac Stents (RCA, LCx)  Normal cardiovascular exam Rhythm:Regular Rate:Normal     Neuro/Psych PSYCHIATRIC DISORDERS Anxiety Depression Bipolar Disorder negative neurological ROS     GI/Hepatic GERD  Medicated and Controlled,(+)     substance abuse (remote EtOH)  alcohol use,   Endo/Other  diabetes, Type 2, Oral Hypoglycemic Agents, Insulin DependentHypothyroidism Obesity   Renal/GU negative Renal ROS     Musculoskeletal  (+) Arthritis ,   Abdominal   Peds  Hematology  (+) Blood dyscrasia (Plavix), , Plt 260k   Anesthesia Other Findings   Reproductive/Obstetrics  Anesthesia Physical Anesthesia Plan  ASA: III  Anesthesia Plan: General   Post-op Pain Management:    Induction: Intravenous  PONV Risk Score and Plan: 3 and Midazolam, Dexamethasone and Ondansetron  Airway Management Planned: Oral ETT  Additional Equipment:   Intra-op Plan:   Post-operative Plan: Extubation in OR  Informed Consent: I have reviewed the patients History and Physical, chart, labs and discussed the procedure including the risks, benefits and alternatives for the proposed anesthesia with the patient or  authorized representative who has indicated his/her understanding and acceptance.       Plan Discussed with: CRNA  Anesthesia Plan Comments:         Anesthesia Quick Evaluation

## 2022-02-17 NOTE — Progress Notes (Signed)
Anesthesia Chart Review   Case: [id number] Date/Time: 02/19/22 0700   Procedure: REVERSE SHOULDER ARTHROPLASTY (Right: Shoulder)   Anesthesia type: Choice   Pre-op diagnosis: RIGHT t shoulder totator arthropathy   Location: [facility] ROOM 08 / [facility]   Surgeons: Nicholes Stairs, MD       DISCUSSION:66 y.o.o former smoker with h/o DM II, asthma, anxiety, depression, CAD (stent RCA/Cx 2008, restent RCA 2009, patent on cath 08/2020), right shoulder OA scheduled for above procedure 02/19/2022 with Dr. Nicholes Stairs.   Last dose of Plavix 02/14/2022.   Pt last seen by cardiology 07/13/2021. Stable at this visit with 1 year follow up recommended.   Clearance from cardiology received which states pt is low risk for planned procedure.  VS: BP 105/69   Pulse 66   Temp 36.6 C (Oral)   Ht 5' (1.524 m)   Wt 98.9 kg   SpO2 100%   BMI 42.58 kg/m   PROVIDERS: Maris Berger, MD is PCP   Abran Richard, MD is Cardiologist  LABS: Labs reviewed: Acceptable for surgery. (all labs ordered are listed, but only abnormal results are displayed)  Labs Reviewed  COMPREHENSIVE METABOLIC PANEL - Abnormal; Notable for the following components:      Result Value   Glucose, Bld 165 (*)    All other components within normal limits  HEMOGLOBIN A1C - Abnormal; Notable for the following components:   Hgb A1c MFr Bld 7.0 (*)    All other components within normal limits  CBC - Abnormal; Notable for the following components:   WBC 11.8 (*)    MCV 110.5 (*)    MCH 36.2 (*)    All other components within normal limits  GLUCOSE, CAPILLARY - Abnormal; Notable for the following components:   Glucose-Capillary 164 (*)    All other components within normal limits  SURGICAL PCR SCREEN     IMAGES:   EKG:   CV: Cardiac Cath 08/20/2020 CONCLUSIONS:    1.  Nonobstructive CAD.  2.  Patent stent in CFX.  3.  Patent stent in RCA with 50% ISR.  iFR 0.91 (nonobstructive).  4.  PCI not performed.  5.   Normal LV systolic function with EF visually estimated to be 60%, and  LVEDP slightly elevated at 18 mmHg.  6.  RRR access.  No complications.  Hemostasis with TR band.  Past Medical History:  Diagnosis Date   Anginal pain ([place])    Anxiety and depression    Arthritis    Asthma    CAD (coronary artery disease)    a. s/p BMS to RCA and LCX 2008; b. ISR of RCA rx'd with Xience DES 12/09; c. Requires extensive sedation for cath; d. cath 11/10: nonobs; e.Cath 2/12: nonobs; f. 05/2011 Cath: nonobs; g. 02/2013 nonobs; h. 07/2013 lat isch on nuc-->cath: nonobstructive, EF 60-65%.   Chronic chest pain    Colitis    IBS   Complication of anesthesia    Diabetes mellitus without complication (HCC)    FUO (fever of unknown origin) 11/15/2019   GERD (gastroesophageal reflux disease)    H/O ETOH abuse    H/O: suicide attempt    a. multiple   Hyperlipidemia    Hypertension    Hypothyroidism    IBS (irritable bowel syndrome)    Myocardial infarction [facility]) 2008   Obesity    Pneumonia    Tobacco abuse     Past Surgical History:  Procedure Laterality Date   ARTHROSCOPY KNEE  W/ DRILLING Left    CARDIAC CATHETERIZATION  2008   stents   CARPAL TUNNEL RELEASE     CATARACT EXTRACTION W/ INTRAOCULAR LENS IMPLANT Bilateral 11/2020   CESAREAN SECTION     x 3   COLON BIOPSY  08/12/2008   LEFT HEART CATHETERIZATION WITH CORONARY ANGIOGRAM  06/04/2011   Procedure: LEFT HEART CATHETERIZATION WITH CORONARY ANGIOGRAM;  Surgeon: Burnell Blanks, MD;  Location: [facility] CATH LAB;  Service: Cardiovascular;;   LEFT HEART CATHETERIZATION WITH CORONARY ANGIOGRAM N/A 02/22/2013   Procedure: LEFT HEART CATHETERIZATION WITH CORONARY ANGIOGRAM;  Surgeon: Ramond Dial, MD;  Location: [facility] CATH LAB;  Service: Cardiovascular;  Laterality: N/A;   LEFT HEART CATHETERIZATION WITH CORONARY ANGIOGRAM N/A 07/25/2013   Procedure: LEFT HEART CATHETERIZATION WITH CORONARY ANGIOGRAM;  Surgeon: Sinclair Grooms, MD;   Location: [facility] CATH LAB;  Service: Cardiovascular;  Laterality: N/A;   PTCA     stent   removal ganglion Left    removal of breast tumor Right 1973   ROTATOR CUFF REPAIR Right 2013   TOTAL HIP ARTHROPLASTY Left 05/20/2020   Procedure: TOTAL HIP ARTHROPLASTY ANTERIOR APPROACH;  Surgeon: Paralee Cancel, MD;  Location: [facility];  Service: Orthopedics;  Laterality: Left;  70 mins   TUBAL LIGATION  1982    MEDICATIONS:  albuterol (VENTOLIN HFA) 108 (90 Base) MCG/ACT inhaler   aspirin 81 MG chewable tablet   atorvastatin (LIPITOR) 40 MG tablet   clopidogrel (PLAVIX) 75 MG tablet   diazepam (VALIUM) 5 MG tablet   famotidine (PEPCID) 40 MG tablet   furosemide (LASIX) 20 MG tablet   JARDIANCE 10 MG TABS tablet   lamoTRIgine (LAMICTAL) 200 MG tablet   levothyroxine (SYNTHROID) 88 MCG tablet   loperamide (IMODIUM A-D) 2 MG tablet   methocarbamol (ROBAXIN) 500 MG tablet   metoprolol tartrate (LOPRESSOR) 25 MG tablet   Multiple Vitamin (MULTIVITAMIN WITH MINERALS) TABS tablet   nicotine (NICODERM CQ - DOSED IN MG/24 HOURS) 21 mg/24hr patch   nitroGLYCERIN (NITROSTAT) 0.4 MG SL tablet   olmesartan (BENICAR) 5 MG tablet   pantoprazole (PROTONIX) 40 MG tablet   prazosin (MINIPRESS) 1 MG capsule   ranolazine (RANEXA) 500 MG 12 hr tablet   topiramate (TOPAMAX) 50 MG tablet   traZODone (DESYREL) 100 MG tablet   venlafaxine XR (EFFEXOR-XR) 150 MG 24 hr capsule   venlafaxine XR (EFFEXOR-XR) 75 MG 24 hr capsule   No current facility-administered medications for this encounter.    Konrad Felix Ward, PA-C [facility] [phone]

## 2022-02-19 ENCOUNTER — Encounter (HOSPITAL_COMMUNITY): Payer: Self-pay | Admitting: Orthopedic Surgery

## 2022-02-19 ENCOUNTER — Other Ambulatory Visit: Payer: Self-pay

## 2022-02-19 ENCOUNTER — Inpatient Hospital Stay (HOSPITAL_COMMUNITY): Payer: Medicare Other | Admitting: Physician Assistant

## 2022-02-19 ENCOUNTER — Encounter (HOSPITAL_COMMUNITY)
Admission: RE | Disposition: A | Discharge status: Home / Self Care | Payer: Self-pay | Source: Home / Self Care | Attending: Orthopedic Surgery

## 2022-02-19 ENCOUNTER — Inpatient Hospital Stay (HOSPITAL_COMMUNITY): Payer: Medicare Other

## 2022-02-19 ENCOUNTER — Inpatient Hospital Stay (HOSPITAL_COMMUNITY): Payer: Medicare Other | Admitting: Certified Registered Nurse Anesthetist

## 2022-02-19 ENCOUNTER — Inpatient Hospital Stay (HOSPITAL_COMMUNITY)
Admission: RE | Admit: 2022-02-19 | Discharge: 2022-02-21 | DRG: 483 | Disposition: A | Discharge status: Home / Self Care | Payer: Medicare Other | Attending: Orthopedic Surgery | Admitting: Orthopedic Surgery

## 2022-02-19 DIAGNOSIS — K219 Gastro-esophageal reflux disease without esophagitis: Secondary | ICD-10-CM | POA: Diagnosis present

## 2022-02-19 DIAGNOSIS — I252 Old myocardial infarction: Secondary | ICD-10-CM

## 2022-02-19 DIAGNOSIS — F319 Bipolar disorder, unspecified: Secondary | ICD-10-CM | POA: Diagnosis present

## 2022-02-19 DIAGNOSIS — Z6841 Body Mass Index (BMI) 40.0 and over, adult: Secondary | ICD-10-CM | POA: Diagnosis not present

## 2022-02-19 DIAGNOSIS — E039 Hypothyroidism, unspecified: Secondary | ICD-10-CM | POA: Diagnosis present

## 2022-02-19 DIAGNOSIS — E119 Type 2 diabetes mellitus without complications: Secondary | ICD-10-CM | POA: Diagnosis present

## 2022-02-19 DIAGNOSIS — Z96642 Presence of left artificial hip joint: Secondary | ICD-10-CM | POA: Diagnosis present

## 2022-02-19 DIAGNOSIS — Z7984 Long term (current) use of oral hypoglycemic drugs: Secondary | ICD-10-CM

## 2022-02-19 DIAGNOSIS — J45909 Unspecified asthma, uncomplicated: Secondary | ICD-10-CM | POA: Diagnosis present

## 2022-02-19 DIAGNOSIS — Z87891 Personal history of nicotine dependence: Secondary | ICD-10-CM

## 2022-02-19 DIAGNOSIS — M19011 Primary osteoarthritis, right shoulder: Principal | ICD-10-CM | POA: Diagnosis present

## 2022-02-19 DIAGNOSIS — Z7902 Long term (current) use of antithrombotics/antiplatelets: Secondary | ICD-10-CM

## 2022-02-19 DIAGNOSIS — Z7982 Long term (current) use of aspirin: Secondary | ICD-10-CM

## 2022-02-19 DIAGNOSIS — Z79890 Hormone replacement therapy (postmenopausal): Secondary | ICD-10-CM

## 2022-02-19 DIAGNOSIS — Z8042 Family history of malignant neoplasm of prostate: Secondary | ICD-10-CM

## 2022-02-19 DIAGNOSIS — E669 Obesity, unspecified: Secondary | ICD-10-CM | POA: Diagnosis present

## 2022-02-19 DIAGNOSIS — Z79899 Other long term (current) drug therapy: Secondary | ICD-10-CM | POA: Diagnosis not present

## 2022-02-19 DIAGNOSIS — F411 Generalized anxiety disorder: Secondary | ICD-10-CM | POA: Diagnosis present

## 2022-02-19 DIAGNOSIS — Z800 Family history of malignant neoplasm of digestive organs: Secondary | ICD-10-CM

## 2022-02-19 DIAGNOSIS — Z96611 Presence of right artificial shoulder joint: Principal | ICD-10-CM

## 2022-02-19 DIAGNOSIS — I10 Essential (primary) hypertension: Secondary | ICD-10-CM | POA: Diagnosis present

## 2022-02-19 DIAGNOSIS — Z955 Presence of coronary angioplasty implant and graft: Secondary | ICD-10-CM

## 2022-02-19 DIAGNOSIS — M12811 Other specific arthropathies, not elsewhere classified, right shoulder: Secondary | ICD-10-CM | POA: Diagnosis not present

## 2022-02-19 DIAGNOSIS — Z794 Long term (current) use of insulin: Secondary | ICD-10-CM

## 2022-02-19 DIAGNOSIS — Z8249 Family history of ischemic heart disease and other diseases of the circulatory system: Secondary | ICD-10-CM

## 2022-02-19 DIAGNOSIS — I2510 Atherosclerotic heart disease of native coronary artery without angina pectoris: Secondary | ICD-10-CM | POA: Diagnosis present

## 2022-02-19 DIAGNOSIS — Z888 Allergy status to other drugs, medicaments and biological substances status: Secondary | ICD-10-CM | POA: Diagnosis not present

## 2022-02-19 DIAGNOSIS — Z825 Family history of asthma and other chronic lower respiratory diseases: Secondary | ICD-10-CM | POA: Diagnosis not present

## 2022-02-19 DIAGNOSIS — E785 Hyperlipidemia, unspecified: Secondary | ICD-10-CM | POA: Diagnosis present

## 2022-02-19 HISTORY — PX: REVERSE SHOULDER ARTHROPLASTY: SHX5054

## 2022-02-19 LAB — GLUCOSE, CAPILLARY
Glucose-Capillary: 113 mg/dL — ABNORMAL HIGH (ref 70–99)
Glucose-Capillary: 166 mg/dL — ABNORMAL HIGH (ref 70–99)
Glucose-Capillary: 269 mg/dL — ABNORMAL HIGH (ref 70–99)
Glucose-Capillary: 168 mg/dL — ABNORMAL HIGH (ref 70–99)

## 2022-02-19 SURGERY — ARTHROPLASTY, SHOULDER, TOTAL, REVERSE
Anesthesia: General | Site: Shoulder | Laterality: Right

## 2022-02-19 MED ORDER — ONDANSETRON HCL 4 MG/2ML IJ SOLN: 4.0000 mg | Freq: Once | INTRAMUSCULAR | Status: DC | PRN

## 2022-02-19 MED ORDER — FAMOTIDINE 20 MG PO TABS
40.0000 mg | ORAL_TABLET | Freq: Every day | ORAL | Status: DC
  Administered 2022-02-20 – 2022-02-21 (×2): 40 mg via ORAL
  Filled 2022-02-19 (×2): qty 2

## 2022-02-19 MED ORDER — BUPIVACAINE LIPOSOME 1.3 % IJ SUSP
INTRAMUSCULAR | Status: DC | PRN
  Administered 2022-02-19 (×5): 2 mL via PERINEURAL

## 2022-02-19 MED ORDER — CEFAZOLIN SODIUM-DEXTROSE 2-4 GM/100ML-% IV SOLN
2.0000 g | INTRAVENOUS | Status: AC
  Administered 2022-02-19: 2 g via INTRAVENOUS
  Filled 2022-02-19: qty 100

## 2022-02-19 MED ORDER — GLYCOPYRROLATE 0.2 MG/ML IJ SOLN
INTRAMUSCULAR | Status: AC
  Filled 2022-02-19: qty 1

## 2022-02-19 MED ORDER — NICOTINE 21 MG/24HR TD PT24
21.0000 mg | MEDICATED_PATCH | Freq: Every day | TRANSDERMAL | Status: DC
Start: 2022-02-19 — End: 2022-02-21
  Administered 2022-02-19 – 2022-02-21 (×3): 21 mg via TRANSDERMAL
  Filled 2022-02-19 (×3): qty 1

## 2022-02-19 MED ORDER — ONDANSETRON HCL 4 MG/2ML IJ SOLN
INTRAMUSCULAR | Status: DC | PRN
  Administered 2022-02-19: 4 mg via INTRAVENOUS

## 2022-02-19 MED ORDER — FENTANYL CITRATE PF 50 MCG/ML IJ SOSY
PREFILLED_SYRINGE | INTRAMUSCULAR | Status: AC
  Filled 2022-02-19: qty 1

## 2022-02-19 MED ORDER — BUPIVACAINE-EPINEPHRINE (PF) 0.5% -1:200000 IJ SOLN: INTRAMUSCULAR | Status: DC | PRN

## 2022-02-19 MED ORDER — FENTANYL CITRATE (PF) 100 MCG/2ML IJ SOLN
INTRAMUSCULAR | Status: AC
  Filled 2022-02-19: qty 2

## 2022-02-19 MED ORDER — HYDROCODONE-ACETAMINOPHEN 7.5-325 MG PO TABS
ORAL_TABLET | ORAL | Status: AC
  Filled 2022-02-19: qty 1

## 2022-02-19 MED ORDER — METOCLOPRAMIDE HCL 5 MG PO TABS: 5.0000 mg | ORAL_TABLET | Freq: Three times a day (TID) | ORAL | Status: DC | PRN

## 2022-02-19 MED ORDER — DEXAMETHASONE SODIUM PHOSPHATE 4 MG/ML IJ SOLN
INTRAMUSCULAR | Status: DC | PRN
  Administered 2022-02-19: 4 mg via INTRAVENOUS

## 2022-02-19 MED ORDER — LEVOTHYROXINE SODIUM 88 MCG PO TABS
88.0000 ug | ORAL_TABLET | Freq: Every day | ORAL | Status: DC
Start: 2022-02-20 — End: 2022-02-21
  Administered 2022-02-20 – 2022-02-21 (×2): 88 ug via ORAL
  Filled 2022-02-19 (×2): qty 1

## 2022-02-19 MED ORDER — PHENYLEPHRINE 80 MCG/ML (10ML) SYRINGE FOR IV PUSH (FOR BLOOD PRESSURE SUPPORT)
PREFILLED_SYRINGE | INTRAVENOUS | Status: AC
  Filled 2022-02-19: qty 10

## 2022-02-19 MED ORDER — PHENYLEPHRINE HCL-NACL 20-0.9 MG/250ML-% IV SOLN
INTRAVENOUS | Status: DC | PRN
  Administered 2022-02-19: 80 ug/min via INTRAVENOUS

## 2022-02-19 MED ORDER — PHENOL 1.4 % MT LIQD
1.0000 | OROMUCOSAL | Status: DC | PRN
Start: 2022-02-19 — End: 2022-02-21

## 2022-02-19 MED ORDER — DEXMEDETOMIDINE HCL IN NACL 200 MCG/50ML IV SOLN
INTRAVENOUS | Status: DC | PRN
  Administered 2022-02-19 (×5): 8 ug via INTRAVENOUS

## 2022-02-19 MED ORDER — MIDAZOLAM HCL 2 MG/2ML IJ SOLN
INTRAMUSCULAR | Status: AC
  Filled 2022-02-19: qty 2

## 2022-02-19 MED ORDER — HYDROMORPHONE HCL 1 MG/ML IJ SOLN
0.2500 mg | INTRAMUSCULAR | Status: DC | PRN
  Administered 2022-02-19: 0.5 mg via INTRAVENOUS

## 2022-02-19 MED ORDER — TRANEXAMIC ACID-NACL 1000-0.7 MG/100ML-% IV SOLN
1000.0000 mg | INTRAVENOUS | Status: AC
  Administered 2022-02-19: 1000 mg via INTRAVENOUS
  Filled 2022-02-19: qty 100

## 2022-02-19 MED ORDER — MEPERIDINE HCL 50 MG/ML IJ SOLN: 6.2500 mg | INTRAMUSCULAR | Status: DC | PRN

## 2022-02-19 MED ORDER — METOPROLOL TARTRATE 25 MG PO TABS
25.0000 mg | ORAL_TABLET | Freq: Every day | ORAL | Status: DC
  Administered 2022-02-20 – 2022-02-21 (×2): 25 mg via ORAL
  Filled 2022-02-19 (×2): qty 1

## 2022-02-19 MED ORDER — ATORVASTATIN CALCIUM 40 MG PO TABS
40.0000 mg | ORAL_TABLET | Freq: Every day | ORAL | Status: DC
  Administered 2022-02-19 – 2022-02-20 (×2): 40 mg via ORAL
  Filled 2022-02-19 (×2): qty 1

## 2022-02-19 MED ORDER — ORAL CARE MOUTH RINSE: 15.0000 mL | Freq: Once | OROMUCOSAL | Status: AC

## 2022-02-19 MED ORDER — TRANEXAMIC ACID-NACL 1000-0.7 MG/100ML-% IV SOLN
1000.0000 mg | Freq: Once | INTRAVENOUS | Status: AC
  Administered 2022-02-19: 1000 mg via INTRAVENOUS
  Filled 2022-02-19: qty 100

## 2022-02-19 MED ORDER — MORPHINE SULFATE (PF) 2 MG/ML IV SOLN: 0.5000 mg | INTRAVENOUS | Status: DC | PRN

## 2022-02-19 MED ORDER — EPHEDRINE SULFATE (PRESSORS) 50 MG/ML IJ SOLN
INTRAMUSCULAR | Status: DC | PRN
  Administered 2022-02-19 (×2): 10 mg via INTRAVENOUS
  Administered 2022-02-19: 5 mg via INTRAVENOUS

## 2022-02-19 MED ORDER — MIDAZOLAM HCL 5 MG/5ML IJ SOLN
INTRAMUSCULAR | Status: DC | PRN
  Administered 2022-02-19 (×2): 1 mg via INTRAVENOUS
  Administered 2022-02-19 (×2): 2 mg via INTRAVENOUS

## 2022-02-19 MED ORDER — EPHEDRINE 5 MG/ML INJ
INTRAVENOUS | Status: AC
  Filled 2022-02-19: qty 5

## 2022-02-19 MED ORDER — TOPIRAMATE 25 MG PO TABS
50.0000 mg | ORAL_TABLET | Freq: Every day | ORAL | Status: DC
  Administered 2022-02-19 – 2022-02-20 (×2): 50 mg via ORAL
  Filled 2022-02-19 (×2): qty 2

## 2022-02-19 MED ORDER — HYDROMORPHONE HCL 1 MG/ML IJ SOLN
INTRAMUSCULAR | Status: AC
  Filled 2022-02-19: qty 2

## 2022-02-19 MED ORDER — MENTHOL 3 MG MT LOZG
1.0000 | LOZENGE | OROMUCOSAL | Status: DC | PRN
Start: 2022-02-19 — End: 2022-02-21

## 2022-02-19 MED ORDER — PHENYLEPHRINE HCL (PRESSORS) 10 MG/ML IV SOLN
INTRAVENOUS | Status: AC
  Filled 2022-02-19: qty 1

## 2022-02-19 MED ORDER — IRBESARTAN 75 MG PO TABS
37.5000 mg | ORAL_TABLET | Freq: Every day | ORAL | Status: DC
  Administered 2022-02-20 – 2022-02-21 (×2): 37.5 mg via ORAL
  Filled 2022-02-19 (×2): qty 1

## 2022-02-19 MED ORDER — OXYCODONE HCL 5 MG PO TABS
5.0000 mg | ORAL_TABLET | ORAL | Status: DC | PRN
  Administered 2022-02-19 – 2022-02-21 (×6): 5 mg via ORAL
  Filled 2022-02-19 (×6): qty 1

## 2022-02-19 MED ORDER — FENTANYL CITRATE (PF) 100 MCG/2ML IJ SOLN
INTRAMUSCULAR | Status: DC | PRN
  Administered 2022-02-19: 50 ug via INTRAVENOUS
  Administered 2022-02-19: 100 ug via INTRAVENOUS
  Administered 2022-02-19: 50 ug via INTRAVENOUS
  Administered 2022-02-19: 100 ug via INTRAVENOUS

## 2022-02-19 MED ORDER — DOCUSATE SODIUM 100 MG PO CAPS
100.0000 mg | ORAL_CAPSULE | Freq: Two times a day (BID) | ORAL | Status: DC
  Administered 2022-02-19 – 2022-02-21 (×4): 100 mg via ORAL
  Filled 2022-02-19 (×4): qty 1

## 2022-02-19 MED ORDER — HYDROCODONE-ACETAMINOPHEN 7.5-325 MG PO TABS
1.0000 | ORAL_TABLET | ORAL | Status: DC | PRN
  Administered 2022-02-19 (×2): 1 via ORAL
  Administered 2022-02-20 (×2): 2 via ORAL
  Filled 2022-02-19 (×2): qty 2

## 2022-02-19 MED ORDER — ROCURONIUM BROMIDE 10 MG/ML (PF) SYRINGE
PREFILLED_SYRINGE | INTRAVENOUS | Status: DC | PRN
  Administered 2022-02-19: 100 mg via INTRAVENOUS

## 2022-02-19 MED ORDER — ALBUTEROL SULFATE (2.5 MG/3ML) 0.083% IN NEBU
3.0000 mL | INHALATION_SOLUTION | RESPIRATORY_TRACT | Status: DC | PRN
  Administered 2022-02-20: 3 mL via RESPIRATORY_TRACT
  Filled 2022-02-19: qty 3

## 2022-02-19 MED ORDER — INSULIN ASPART 100 UNIT/ML IJ SOLN: 0.0000 [IU] | Freq: Every day | INTRAMUSCULAR | Status: DC

## 2022-02-19 MED ORDER — FENTANYL CITRATE PF 50 MCG/ML IJ SOSY
PREFILLED_SYRINGE | INTRAMUSCULAR | Status: AC
  Filled 2022-02-19: qty 2

## 2022-02-19 MED ORDER — PHENYLEPHRINE 80 MCG/ML (10ML) SYRINGE FOR IV PUSH (FOR BLOOD PRESSURE SUPPORT)
PREFILLED_SYRINGE | INTRAVENOUS | Status: DC | PRN
  Administered 2022-02-19 (×5): 80 ug via INTRAVENOUS

## 2022-02-19 MED ORDER — GLYCOPYRROLATE 0.2 MG/ML IJ SOLN
INTRAMUSCULAR | Status: DC | PRN
  Administered 2022-02-19 (×2): .1 mg via INTRAVENOUS

## 2022-02-19 MED ORDER — ROCURONIUM BROMIDE 10 MG/ML (PF) SYRINGE
PREFILLED_SYRINGE | INTRAVENOUS | Status: AC
  Filled 2022-02-19: qty 10

## 2022-02-19 MED ORDER — ONDANSETRON HCL 4 MG/2ML IJ SOLN
INTRAMUSCULAR | Status: AC
  Filled 2022-02-19: qty 2

## 2022-02-19 MED ORDER — HYDROCODONE-ACETAMINOPHEN 5-325 MG PO TABS
1.0000 | ORAL_TABLET | ORAL | Status: DC | PRN
  Administered 2022-02-21: 2 via ORAL
  Filled 2022-02-19: qty 2

## 2022-02-19 MED ORDER — METOCLOPRAMIDE HCL 5 MG/ML IJ SOLN: 5.0000 mg | Freq: Three times a day (TID) | INTRAMUSCULAR | Status: DC | PRN

## 2022-02-19 MED ORDER — BUPIVACAINE-EPINEPHRINE (PF) 0.5% -1:200000 IJ SOLN
INTRAMUSCULAR | Status: DC | PRN
  Administered 2022-02-19 (×5): 3 mL via PERINEURAL

## 2022-02-19 MED ORDER — DEXAMETHASONE SODIUM PHOSPHATE 10 MG/ML IJ SOLN
INTRAMUSCULAR | Status: AC
  Filled 2022-02-19: qty 1

## 2022-02-19 MED ORDER — ACETAMINOPHEN 500 MG PO TABS
500.0000 mg | ORAL_TABLET | Freq: Four times a day (QID) | ORAL | Status: AC
  Administered 2022-02-19 – 2022-02-20 (×4): 500 mg via ORAL
  Filled 2022-02-19 (×4): qty 1

## 2022-02-19 MED ORDER — PROPOFOL 10 MG/ML IV BOLUS
INTRAVENOUS | Status: DC | PRN
  Administered 2022-02-19: 200 mg via INTRAVENOUS

## 2022-02-19 MED ORDER — SUGAMMADEX SODIUM 500 MG/5ML IV SOLN
INTRAVENOUS | Status: DC | PRN
  Administered 2022-02-19: 400 mg via INTRAVENOUS
  Administered 2022-02-19: 100 mg via INTRAVENOUS

## 2022-02-19 MED ORDER — VANCOMYCIN HCL 1000 MG IV SOLR
INTRAVENOUS | Status: AC
  Filled 2022-02-19: qty 20

## 2022-02-19 MED ORDER — HYDROMORPHONE HCL 1 MG/ML IJ SOLN
0.5000 mg | Freq: Once | INTRAMUSCULAR | Status: AC
  Administered 2022-02-19: 0.5 mg via INTRAVENOUS
  Filled 2022-02-19: qty 0.5

## 2022-02-19 MED ORDER — LACTATED RINGERS IV SOLN: INTRAVENOUS | Status: DC

## 2022-02-19 MED ORDER — CLOPIDOGREL BISULFATE 75 MG PO TABS
75.0000 mg | ORAL_TABLET | Freq: Every day | ORAL | Status: DC
  Administered 2022-02-20: 75 mg via ORAL
  Filled 2022-02-19: qty 1

## 2022-02-19 MED ORDER — PROPOFOL 10 MG/ML IV BOLUS
INTRAVENOUS | Status: AC
  Filled 2022-02-19: qty 20

## 2022-02-19 MED ORDER — PANTOPRAZOLE SODIUM 40 MG PO TBEC
40.0000 mg | DELAYED_RELEASE_TABLET | Freq: Every day | ORAL | Status: DC
  Administered 2022-02-20 – 2022-02-21 (×2): 40 mg via ORAL
  Filled 2022-02-19 (×2): qty 1

## 2022-02-19 MED ORDER — LAMOTRIGINE 100 MG PO TABS
200.0000 mg | ORAL_TABLET | Freq: Every day | ORAL | Status: DC
  Administered 2022-02-20 – 2022-02-21 (×2): 200 mg via ORAL
  Filled 2022-02-19 (×2): qty 2

## 2022-02-19 MED ORDER — INSULIN ASPART 100 UNIT/ML IJ SOLN
0.0000 [IU] | Freq: Three times a day (TID) | INTRAMUSCULAR | Status: DC
  Administered 2022-02-19: 8 [IU] via SUBCUTANEOUS
  Administered 2022-02-20 (×2): 3 [IU] via SUBCUTANEOUS
  Administered 2022-02-20: 2 [IU] via SUBCUTANEOUS
  Administered 2022-02-21: 5 [IU] via SUBCUTANEOUS
  Administered 2022-02-21: 2 [IU] via SUBCUTANEOUS

## 2022-02-19 MED ORDER — KETOROLAC TROMETHAMINE 30 MG/ML IJ SOLN: 15.0000 mg | Freq: Once | INTRAMUSCULAR | Status: DC | PRN

## 2022-02-19 MED ORDER — ASPIRIN 81 MG PO CHEW
81.0000 mg | CHEWABLE_TABLET | Freq: Every day | ORAL | Status: DC
  Administered 2022-02-20 – 2022-02-21 (×2): 81 mg via ORAL
  Filled 2022-02-19 (×2): qty 1

## 2022-02-19 MED ORDER — VENLAFAXINE HCL ER 75 MG PO CP24
75.0000 mg | ORAL_CAPSULE | Freq: Every day | ORAL | Status: DC
  Administered 2022-02-20 – 2022-02-21 (×2): 75 mg via ORAL
  Filled 2022-02-19 (×2): qty 1

## 2022-02-19 MED ORDER — VANCOMYCIN HCL 1000 MG IV SOLR
INTRAVENOUS | Status: DC | PRN
  Administered 2022-02-19: 1000 mg via TOPICAL

## 2022-02-19 MED ORDER — FUROSEMIDE 20 MG PO TABS
20.0000 mg | ORAL_TABLET | Freq: Every day | ORAL | Status: DC
  Administered 2022-02-20 – 2022-02-21 (×2): 20 mg via ORAL
  Filled 2022-02-19 (×2): qty 1

## 2022-02-19 MED ORDER — CHLORHEXIDINE GLUCONATE 0.12 % MT SOLN
15.0000 mL | Freq: Once | OROMUCOSAL | Status: AC
  Administered 2022-02-19: 15 mL via OROMUCOSAL

## 2022-02-19 MED ORDER — CEFAZOLIN SODIUM-DEXTROSE 1-4 GM/50ML-% IV SOLN
1.0000 g | Freq: Four times a day (QID) | INTRAVENOUS | Status: AC
  Administered 2022-02-19 – 2022-02-20 (×3): 1 g via INTRAVENOUS
  Filled 2022-02-19 (×3): qty 50

## 2022-02-19 MED ORDER — PRAZOSIN HCL 1 MG PO CAPS
2.0000 mg | ORAL_CAPSULE | Freq: Every day | ORAL | Status: DC
  Administered 2022-02-19 – 2022-02-20 (×2): 2 mg via ORAL
  Filled 2022-02-19 (×2): qty 2

## 2022-02-19 MED ORDER — LIDOCAINE HCL (PF) 2 % IJ SOLN
INTRAMUSCULAR | Status: AC
  Filled 2022-02-19: qty 5

## 2022-02-19 MED ORDER — SUGAMMADEX SODIUM 500 MG/5ML IV SOLN: INTRAVENOUS | Status: DC | PRN

## 2022-02-19 MED ORDER — ONDANSETRON HCL 4 MG PO TABS: 4.0000 mg | ORAL_TABLET | Freq: Four times a day (QID) | ORAL | Status: DC | PRN

## 2022-02-19 MED ORDER — ONDANSETRON HCL 4 MG/2ML IJ SOLN: 4.0000 mg | Freq: Four times a day (QID) | INTRAMUSCULAR | Status: DC | PRN

## 2022-02-19 MED ORDER — NITROGLYCERIN 0.4 MG SL SUBL: 0.4000 mg | SUBLINGUAL_TABLET | SUBLINGUAL | Status: DC | PRN

## 2022-02-19 MED ORDER — EMPAGLIFLOZIN 10 MG PO TABS
10.0000 mg | ORAL_TABLET | Freq: Every day | ORAL | Status: DC
  Administered 2022-02-20 – 2022-02-21 (×2): 10 mg via ORAL
  Filled 2022-02-19 (×2): qty 1

## 2022-02-19 MED ORDER — 0.9 % SODIUM CHLORIDE (POUR BTL) OPTIME
TOPICAL | Status: DC | PRN
  Administered 2022-02-19: 1000 mL

## 2022-02-19 MED ORDER — ACETAMINOPHEN 325 MG PO TABS: 325.0000 mg | ORAL_TABLET | Freq: Four times a day (QID) | ORAL | Status: DC | PRN

## 2022-02-19 MED ORDER — DIAZEPAM 5 MG PO TABS
5.0000 mg | ORAL_TABLET | Freq: Every day | ORAL | Status: DC | PRN
  Administered 2022-02-19 – 2022-02-21 (×3): 5 mg via ORAL
  Filled 2022-02-19 (×3): qty 1

## 2022-02-19 MED ORDER — TRAZODONE HCL 100 MG PO TABS
100.0000 mg | ORAL_TABLET | Freq: Every day | ORAL | Status: DC
  Administered 2022-02-19 – 2022-02-20 (×2): 100 mg via ORAL
  Filled 2022-02-19 (×2): qty 1

## 2022-02-19 MED ORDER — VENLAFAXINE HCL ER 150 MG PO CP24
150.0000 mg | ORAL_CAPSULE | Freq: Every day | ORAL | Status: DC
  Administered 2022-02-20 – 2022-02-21 (×2): 150 mg via ORAL
  Filled 2022-02-19 (×2): qty 1

## 2022-02-19 MED ORDER — DEXMEDETOMIDINE HCL IN NACL 80 MCG/20ML IV SOLN
INTRAVENOUS | Status: AC
  Filled 2022-02-19: qty 20

## 2022-02-19 SURGICAL SUPPLY — 62 items
BAG COUNTER SPONGE SURGICOUNT (BAG) IMPLANT
BAG ZIPLOCK 12X15 (MISCELLANEOUS) ×1 IMPLANT
BIT DRILL FLUTED 3.0 STRL (BIT) IMPLANT
BLADE SAG 18X100X1.27 (BLADE) ×1 IMPLANT
COVER BACK TABLE 60X90IN (DRAPES) ×1 IMPLANT
COVER SURGICAL LIGHT HANDLE (MISCELLANEOUS) ×1 IMPLANT
CUP SUT UNIV REVERS 36 NEUTRAL (Cup) IMPLANT
DRAPE ORTHO SPLIT 77X108 STRL (DRAPES) ×2
DRAPE SHEET LG 3/4 BI-LAMINATE (DRAPES) ×1 IMPLANT
DRAPE SURG 17X11 SM STRL (DRAPES) ×1 IMPLANT
DRAPE SURG ORHT 6 SPLT 77X108 (DRAPES) ×2 IMPLANT
DRAPE TOP 10253 STERILE (DRAPES) ×1 IMPLANT
DRAPE U-SHAPE 47X51 STRL (DRAPES) ×1 IMPLANT
DRSG AQUACEL AG ADV 3.5X 6 (GAUZE/BANDAGES/DRESSINGS) IMPLANT
DRSG AQUACEL AG ADV 3.5X10 (GAUZE/BANDAGES/DRESSINGS) ×1 IMPLANT
DURAPREP 26ML APPLICATOR (WOUND CARE) IMPLANT
ELECT BLADE TIP CTD 4 INCH (ELECTRODE) IMPLANT
ELECT REM PT RETURN 15FT ADLT (MISCELLANEOUS) ×1 IMPLANT
FACESHIELD WRAPAROUND (MASK) ×1 IMPLANT
FACESHIELD WRAPAROUND OR TEAM (MASK) ×1 IMPLANT
GLENOID UNI REV MOD 24 +2 LAT (Joint) IMPLANT
GLENOSPHERE 36 +4 LAT/24 (Joint) IMPLANT
GLOVE BIO SURGEON STRL SZ7.5 (GLOVE) ×4 IMPLANT
GLOVE BIOGEL PI IND STRL 8 (GLOVE) ×2 IMPLANT
GOWN STRL REUS W/ TWL XL LVL3 (GOWN DISPOSABLE) ×2 IMPLANT
GOWN STRL REUS W/TWL XL LVL3 (GOWN DISPOSABLE) ×2
KIT BASIN OR (CUSTOM PROCEDURE TRAY) ×1 IMPLANT
KIT TURNOVER KIT A (KITS) IMPLANT
LINER HUMERAL 36 +3MM SM (Shoulder) IMPLANT
MANIFOLD NEPTUNE II (INSTRUMENTS) ×1 IMPLANT
NDL TAPERED W/ NITINOL LOOP (MISCELLANEOUS) IMPLANT
NEEDLE TAPERED W/ NITINOL LOOP (MISCELLANEOUS) IMPLANT
NS IRRIG 1000ML POUR BTL (IV SOLUTION) ×1 IMPLANT
PACK SHOULDER (CUSTOM PROCEDURE TRAY) ×1 IMPLANT
PIN SET MODULAR GLENOID SYSTEM (PIN) IMPLANT
PROTECTOR NERVE ULNAR (MISCELLANEOUS) ×1 IMPLANT
RESTRAINT HEAD UNIVERSAL NS (MISCELLANEOUS) IMPLANT
SCREW CENTRAL MOD 30MM (Screw) IMPLANT
SCREW PERI LOCK 5.5X16 (Screw) IMPLANT
SCREW PERI LOCK 5.5X32 (Screw) IMPLANT
SCREW PERIPHERAL 5.5X28 LOCK (Screw) IMPLANT
SLING ARM FOAM STRAP LRG (SOFTGOODS) IMPLANT
SLING ARM FOAM STRAP MED (SOFTGOODS) IMPLANT
SMARTMIX MINI TOWER (MISCELLANEOUS)
SPONGE T-LAP 4X18 ~~LOC~~+RFID (SPONGE) IMPLANT
STEM HUMERAL MOD SZ 5 135 DEG (Stem) IMPLANT
STRIP CLOSURE SKIN 1/2X4 (GAUZE/BANDAGES/DRESSINGS) ×1 IMPLANT
SUCTION FRAZIER HANDLE 10FR (MISCELLANEOUS) ×1
SUCTION TUBE FRAZIER 10FR DISP (MISCELLANEOUS) ×1 IMPLANT
SUT FIBERWIRE #2 38 T-5 BLUE (SUTURE)
SUT MON AB 3-0 SH 27 (SUTURE) ×1
SUT MON AB 3-0 SH27 (SUTURE) ×1 IMPLANT
SUT VIC AB 0 CT1 36 (SUTURE) ×1 IMPLANT
SUT VIC AB 1 CT1 36 (SUTURE) ×1 IMPLANT
SUT VIC AB 2-0 CT1 27 (SUTURE) ×1
SUT VIC AB 2-0 CT1 TAPERPNT 27 (SUTURE) ×1 IMPLANT
SUTURE FIBERWR #2 38 T-5 BLUE (SUTURE) IMPLANT
SUTURE TAPE 1.3 40 TPR END (SUTURE) ×2 IMPLANT
SUTURETAPE 1.3 40 TPR END (SUTURE) ×2
TOWEL OR 17X26 10 PK STRL BLUE (TOWEL DISPOSABLE) ×1 IMPLANT
TOWER SMARTMIX MINI (MISCELLANEOUS) IMPLANT
TUBE SUCTION HIGH CAP CLEAR NV (SUCTIONS) ×1 IMPLANT

## 2022-02-19 NOTE — Brief Op Note (Signed)
02/19/2022  9:31 AM  PATIENT:  Kara Lamb  66 y.o. female  PRE-OPERATIVE DIAGNOSIS:  RIGHT t shoulder totator arthropathy  POST-OPERATIVE DIAGNOSIS:  RIGHT t shoulder totator arthropathy  PROCEDURE:  Procedure(s): REVERSE SHOULDER ARTHROPLASTY (Right)  SURGEON:  Surgeon(s) and Role:    * Nicholes Stairs, MD - Primary  PHYSICIAN ASSISTANT: Jonelle Sidle, PA-C  ANESTHESIA:   regional and general  EBL:  25 mL   BLOOD ADMINISTERED:none  DRAINS: none   LOCAL MEDICATIONS USED:  NONE  SPECIMEN:  No Specimen  DISPOSITION OF SPECIMEN:  N/A  COUNTS:  YES  TOURNIQUET:  * No tourniquets in log *  DICTATION: .Note written in EPIC  PLAN OF CARE: Discharge to home after PACU  PATIENT DISPOSITION:  PACU - hemodynamically stable.   Delay start of Pharmacological VTE agent (>24hrs) due to surgical blood loss or risk of bleeding: not applicable

## 2022-02-19 NOTE — Plan of Care (Signed)
  Problem: Education: Goal: Understanding of activity limitations/precautions following surgery will improve Outcome: Progressing   Problem: Activity: Goal: Ability to tolerate increased activity will improve Outcome: Progressing   Problem: Pain Management: Goal: Pain level will decrease with appropriate interventions Outcome: Progressing   Problem: Safety: Goal: Ability to remain free from injury will improve Outcome: Progressing

## 2022-02-19 NOTE — Op Note (Signed)
02/19/2022  9:32 AM  PATIENT:  Kara Lamb DIAGNOSIS:  RIGHT shoulder Rotator cuff arthropathy  POST-OPERATIVE DIAGNOSIS:  Same  PROCEDURE:  REVERSE SHOULDER ARTHROPLASTY  SURGEON:  Nicholes Stairs, MD  ASSISTANT: Jonelle Sidle, PA-C  Assistant attestation:  PA Mcclung was present for the entire procedure.  He participated in all critical portions.  ANESTHESIA:   General  ESTIMATED BLOOD LOSS: 25 cc  PREOPERATIVE INDICATIONS:  Kara Lamb is a  66 y.o. female with a diagnosis of RIGHT shoulder rotator arthropathy who failed conservative measures and elected for surgical management.    The risks benefits and alternatives were discussed with the patient preoperatively including but not limited to the risks of infection, bleeding, nerve injury, cardiopulmonary complications, the need for revision surgery, dislocation, brachial plexus palsy, incomplete relief of pain, among others, and the patient was willing to proceed.  OPERATIVE IMPLANTS:  Arthrex universe stem size 5 Standard humeral tray with a +3 polyethylene liner 24 mm +2 lateralized baseplate with a 30 mm central screw and a 36 mm +4 lateralized glenosphere.  4 peripheral locking screws.  OPERATIVE FINDINGS:  Complete absence of the supraspinatus with intact infraspinatus very thin tendon there.  Intact teres minor and intact subscapularis.  Previous biceps tenodesis was also intact.  There was significant adhesions in the lateral subdeltoid space consistent with previous open procedure.  Moderate osteophytes on the humeral side but no cartilage wear on the glenoid.  OPERATIVE PROCEDURE: The patient was brought to the operating room and placed in the supine position. General anesthesia was administered. IV antibiotics were given. A Foley was not placed. Time out was performed. The upper extremity was prepped and draped in usual sterile fashion. The patient was in a beachchair position.  Deltopectoral approach was carried out. The subscapularis was released off of the bone.   I then performed circumferential releases of the humerus, and then dislocated the head, and then reamed with the reamer to the above named size.  I then applied the jig, and cut the humeral head in 30 of retroversion, and then turned my attention to the glenoid.  Deep retractors were placed, and I resected the labrum, and then placed a guidepin into the center position on the glenoid, with slight inferior inclination. I then reamed over the guidepin, and this created a small metaphyseal cancellus blush inferiorly, removing just the cartilage to the subchondral bone superiorly. The base plate was selected and screwed into place with a nonlocking screw, and I had excellent purchase both inferiorly and superiorly. I placed a short locking screws on anterior and posterior aspects.  I then turned my attention to the glenosphere, and impacted this into place, and then securing it with a central set screw   I sequentially broached, and then trialed, and was found to restore soft tissue tension, and it had 2 finger tightness. Therefore the above named components were selected. The shoulder felt stable throughout functional motion.  I then impacted the real prosthesis into place, as well as the real humeral tray, and reduced the shoulder. The shoulder had excellent motion, and was stable, and I irrigated the wounds copiously.   The retention suture used to retract the subscapularis was then used to sew the subscapularis back to the humeral stem.  I then irrigated the shoulder copiously once more, repaired the deltopectoral interval with # #1 Vicryl followed by subcutaneous Vicryl, then monocryl for the skin,  with Steri-Strips and sterile gauze for the  skin. The patient was awakened and returned back in stable and satisfactory condition. There were no complications and they tolerated the procedure well.  All counts  were correct x2.   Disposition:  Kara Lamb will be admitted to the inpatient service postoperatively for close pain monitoring as well as therapies.  She does live alone and we will need to make sure that she clears therapy before she is safe to discharge home or perhaps would need other postoperative care.  Otherwise, she will be in the sling and nonweightbearing to the right upper extremity.  She may remove it for activities of daily living.  She will follow-up with me in 2 weeks.

## 2022-02-19 NOTE — Anesthesia Procedure Notes (Signed)
Procedure Name: Intubation Date/Time: 02/19/2022 7:36 AM  Performed by: Deliah Boston, CRNAPre-anesthesia Checklist: Patient identified, Emergency Drugs available, Suction available and Patient being monitored Patient Re-evaluated:Patient Re-evaluated prior to induction Oxygen Delivery Method: Circle system utilized Preoxygenation: Pre-oxygenation with 100% oxygen Induction Type: IV induction Ventilation: Mask ventilation without difficulty Laryngoscope Size: Mac and 3 Grade View: Grade II Tube type: Oral Tube size: 7.0 mm Number of attempts: 1 Airway Equipment and Method: Stylet Placement Confirmation: ETT inserted through vocal cords under direct vision, positive ETCO2 and breath sounds checked- equal and bilateral Secured at: 22 cm Tube secured with: Tape Dental Injury: Teeth and Oropharynx as per pre-operative assessment  Difficulty Due To: Difficult Airway- due to limited oral opening

## 2022-02-19 NOTE — Anesthesia Postprocedure Evaluation (Signed)
Anesthesia Post Note  Patient: Kara Lamb  Procedure(s) Performed: REVERSE SHOULDER ARTHROPLASTY (Right: Shoulder)     Patient location during evaluation: PACU Anesthesia Type: General Level of consciousness: awake and sedated Pain management: pain level controlled Vital Signs Assessment: post-procedure vital signs reviewed and stable Respiratory status: spontaneous breathing Cardiovascular status: stable Postop Assessment: no apparent nausea or vomiting Anesthetic complications: no   No notable events documented.  Last Vitals:  Vitals:   02/19/22 1300 02/19/22 1403  BP: 134/80 (!) 113/55  Pulse: 80 73  Resp:  16  Temp:  36.6 C  SpO2: 95% 92%    Last Pain:  Vitals:   02/19/22 1403  TempSrc: Oral  PainSc: [place]

## 2022-02-19 NOTE — Anesthesia Procedure Notes (Signed)
Anesthesia Regional Block: Interscalene brachial plexus block   Pre-Anesthetic Checklist: , timeout performed,  Correct Patient, Correct Site, Correct Laterality,  Correct Procedure, Correct Position, site marked,  Risks and benefits discussed,  Surgical consent,  Pre-op evaluation,  At surgeon's request and post-op pain management  Laterality: Right and Upper  Prep: chloraprep       Needles:  Injection technique: Single-shot  Needle Type: Echogenic Stimulator Needle     Needle Length: 9cm  Needle Gauge: 20   Needle insertion depth: 1 cm   Additional Needles:   Procedures:,,,, ultrasound used (permanent image in chart),,    Narrative:  Start time: 02/19/2022 7:07 AM End time: 02/19/2022 7:17 AM Injection made incrementally with aspirations every 5 mL.  Performed by: Personally  Anesthesiologist: Lyn Hollingshead, MD

## 2022-02-19 NOTE — Transfer of Care (Signed)
Immediate Anesthesia Transfer of Care Note  Patient: [place]  Procedure(s) Performed: Procedure(s): REVERSE SHOULDER ARTHROPLASTY (Right)  Patient Location: PACU  Anesthesia Type:General  Level of Consciousness: Patient easily awoken, sedated, comfortable, cooperative, following commands, responds to stimulation.   Airway & Oxygen Therapy: Patient spontaneously breathing, ventilating well, oxygen via simple oxygen mask.  Post-op Assessment: Report given to PACU RN, vital signs reviewed and stable, moving all extremities.   Post vital signs: Reviewed and stable.  Complications: No apparent anesthesia complications Last Vitals:  Vitals Value Taken Time  BP 119/56 02/19/22 0934  Temp    Pulse 76 02/19/22 0936  Resp    SpO2 95 % 02/19/22 0936  Vitals shown include unvalidated device data.  Last Pain:  Vitals:   02/19/22 0633  TempSrc: Oral  PainSc:          Complications: No notable events documented.

## 2022-02-19 NOTE — Discharge Instructions (Signed)
Orthopedic surgery discharge instructions:  -Maintain postoperative bandage until follow-up appointment.  This is waterproof, and you may begin showering on postoperative day #3.  Do not submerge underwater.  Maintain that bandage until your follow-up appointment in 2 weeks.  -No lifting over 2 pounds with operateive arm.  You may use the arm immediately for activities of daily living such as bathing, washing your face and brushing your teeth, eating, and getting dressed.  Otherwise maintain your sling when you are out of the house and sleeping.  -Apply ice liberally to the shoulder throughout the day.  For mild to moderate pain use Tylenol and Advil as needed around-the-clock.  For breakthrough pain use oxycodone as necessary.  -You will return to see Dr. Keayra Graham in the office in 2 weeks for routine postoperative check with x-rays.  

## 2022-02-19 NOTE — H&P (Addendum)
ORTHOPAEDIC H and P  REQUESTING PHYSICIAN: Nicholes Stairs, MD  PCP:  Maris Berger, MD  Chief Complaint: Right shoulder osteoarthritis  HPI: Kara Lamb is a 66 y.o. female who complains of right shoulder pain and dysfunction.  Here today for reverse arthroplasty for definitive treatment.  No new complaints at this time.  Past Medical History:  Diagnosis Date   Anginal pain ([place])    Anxiety and depression    Arthritis    Asthma    CAD (coronary artery disease)    a. s/p BMS to RCA and LCX 2008; b. ISR of RCA rx'd with Xience DES 12/09; c. Requires extensive sedation for cath; d. cath 11/10: nonobs; e.Cath 2/12: nonobs; f. 05/2011 Cath: nonobs; g. 02/2013 nonobs; h. 07/2013 lat isch on nuc-->cath: nonobstructive, EF 60-65%.   Chronic chest pain    Colitis    IBS   Complication of anesthesia    Diabetes mellitus without complication (HCC)    FUO (fever of unknown origin) 11/15/2019   GERD (gastroesophageal reflux disease)    H/O ETOH abuse    H/O: suicide attempt    a. multiple   Hyperlipidemia    Hypertension    Hypothyroidism    IBS (irritable bowel syndrome)    Myocardial infarction ([place]) 2008   Obesity    Pneumonia    Tobacco abuse    Past Surgical History:  Procedure Laterality Date   ARTHROSCOPY KNEE W/ DRILLING Left    CARDIAC CATHETERIZATION  2008   stents   CARPAL TUNNEL RELEASE     CATARACT EXTRACTION W/ INTRAOCULAR LENS IMPLANT Bilateral 11/2020   CESAREAN SECTION     x 3   COLON BIOPSY  08/12/2008   LEFT HEART CATHETERIZATION WITH CORONARY ANGIOGRAM  06/04/2011   Procedure: LEFT HEART CATHETERIZATION WITH CORONARY ANGIOGRAM;  Surgeon: Burnell Blanks, MD;  Location: [facility] CATH LAB;  Service: Cardiovascular;;   LEFT HEART CATHETERIZATION WITH CORONARY ANGIOGRAM N/A 02/22/2013   Procedure: LEFT HEART CATHETERIZATION WITH CORONARY ANGIOGRAM;  Surgeon: Ramond Dial, MD;  Location: [facility] CATH LAB;  Service: Cardiovascular;  Laterality:  N/A;   LEFT HEART CATHETERIZATION WITH CORONARY ANGIOGRAM N/A 07/25/2013   Procedure: LEFT HEART CATHETERIZATION WITH CORONARY ANGIOGRAM;  Surgeon: Sinclair Grooms, MD;  Location: [facility] CATH LAB;  Service: Cardiovascular;  Laterality: N/A;   PTCA     stent   removal ganglion Left    removal of breast tumor Right 1973   ROTATOR CUFF REPAIR Right 2013   TOTAL HIP ARTHROPLASTY Left 05/20/2020   Procedure: TOTAL HIP ARTHROPLASTY ANTERIOR APPROACH;  Surgeon: Paralee Cancel, MD;  Location: [facility] ORS;  Service: Orthopedics;  Laterality: Left;  70 mins   TUBAL LIGATION  1982   Social History   Socioeconomic History   Marital status: Divorced    Spouse name: Not on file   Number of children: 3   Years of education: Not on file   Highest education level: Not on file  Occupational History    Comment: Workmen's comp  Tobacco Use   Smoking status: Former    Packs/day: 0.25    Years: 43.00    Total pack years: 10.75    Types: Cigarettes    Quit date: 11/07/2019    Years since quitting: 2.2   Smokeless tobacco: Never  Vaping Use   Vaping Use: Never used  Substance and Sexual Activity   Alcohol use: Yes    Alcohol/week: 0.0 standard drinks of alcohol  Comment: rarely   Drug use: No   Sexual activity: Not Currently  Other Topics Concern   Not on file  Social History Narrative   Not on file   Social Determinants of Health   Financial Resource Strain: Not on file  Food Insecurity: Not on file  Transportation Needs: Not on file  Physical Activity: Not on file  Stress: Not on file  Social Connections: Not on file   Family History  Problem Relation Age of Onset   Coronary artery disease Mother    Emphysema Mother    Colon cancer Father    Prostate cancer Father    Allergies  Allergen Reactions   Heparin Other (See Comments)    Injections in the stomach causes huge knots, tenderness, and pain for weeks (hematomas)   Imdur [Isosorbide Nitrate] Other (See Comments)    Low blood  pressure   Nitroglycerin Other (See Comments)    Patches--Migraine Drip--nausea and vomiting    Paroxetine Hcl Diarrhea    Severe   Perphenazine Other (See Comments)    Cannot recall    Zoloft [Sertraline Hcl] Other (See Comments)    Severe diarrhea   Zolpidem Other (See Comments)    Sleep walking and loss of memory    Prior to Admission medications   Medication Sig Start Date End Date Taking? Authorizing Provider  albuterol (VENTOLIN HFA) 108 (90 Base) MCG/ACT inhaler Inhale 2 puffs into the lungs every 4 (four) hours as needed. 01/18/21  Yes [provider]  aspirin 81 MG chewable tablet Chew 81 mg by mouth daily. 06/06/13  Yes [provider]  atorvastatin (LIPITOR) 40 MG tablet Take 1 tablet (40 mg total) by mouth daily at 6 PM. NEEDS APPOINTMENT FOR FUTURE REFILLS Patient taking differently: Take 40 mg by mouth daily after supper. NEEDS APPOINTMENT FOR FUTURE REFILLS 08/18/15  Yes Minus Breeding, MD  clopidogrel (PLAVIX) 75 MG tablet Take 1 tablet (75 mg total) by mouth daily. NEEDS APPOINTMENT FOR FUTURE REFILLS OR 90-DAY SUPPLY Patient taking differently: Take 75 mg by mouth daily after supper. NEEDS APPOINTMENT FOR FUTURE REFILLS OR 90-DAY SUPPLY 08/18/15  Yes Minus Breeding, MD  diazepam (VALIUM) 5 MG tablet Take 5-10 mg by mouth daily as needed for anxiety (anxiety). 11/09/19  Yes [provider]  famotidine (PEPCID) 40 MG tablet Take 40 mg by mouth daily. 360   Yes [provider]  furosemide (LASIX) 20 MG tablet Take 20 mg by mouth daily. 05/25/20  Yes [provider]  JARDIANCE 10 MG TABS tablet Take 10 mg by mouth daily. 11/17/21  Yes [provider]  lamoTRIgine (LAMICTAL) 200 MG tablet Take 200 mg by mouth daily after breakfast. 11/28/20  Yes [provider]  levothyroxine (SYNTHROID) 88 MCG tablet Take 88 mcg by mouth daily before breakfast. 10/14/20  Yes [provider]  loperamide (IMODIUM A-D) 2 MG tablet  Take 2 mg by mouth 4 (four) times daily as needed for diarrhea or loose stools.   Yes [provider]  methocarbamol (ROBAXIN) 500 MG tablet Take 1 tablet (500 mg total) by mouth every 6 (six) hours as needed for muscle spasms. 05/21/20  Yes Babish, Rodman Key, PA-C  metoprolol tartrate (LOPRESSOR) 25 MG tablet Take 1 tablet (25 mg total) by mouth 2 (two) times daily. NEEDS APPOINTMENT FOR FUTURE REFILLS OR 90-DAY SUPPLY Patient taking differently: Take 25 mg by mouth daily after breakfast. 08/18/15  Yes Minus Breeding, MD  nicotine (NICODERM CQ - DOSED IN MG/24 HOURS)  21 mg/24hr patch Place 14 mg onto the skin every morning.   Yes [provider]  nitroGLYCERIN (NITROSTAT) 0.4 MG SL tablet Place 1 tablet (0.4 mg total) under the tongue every 5 (five) minutes as needed for chest pain. Patient taking differently: Place 0.4 mg under the tongue every 5 (five) minutes x 3 doses as needed for chest pain. 06/21/13  Yes Minus Breeding, MD  olmesartan (BENICAR) 5 MG tablet Take 5 mg by mouth daily after breakfast.   Yes [provider]  pantoprazole (PROTONIX) 40 MG tablet Take 40 mg by mouth daily. One hour before coffee 10/14/20  Yes [provider]  prazosin (MINIPRESS) 1 MG capsule Take 2 mg by mouth at bedtime. One hour before bedtime 01/21/19  Yes [provider]  ranolazine (RANEXA) 500 MG 12 hr tablet Take 500 mg by mouth in the morning and at bedtime.  11/24/19  Yes [provider]  topiramate (TOPAMAX) 50 MG tablet Take 50 mg by mouth at bedtime.  11/02/19  Yes [provider]  traZODone (DESYREL) 100 MG tablet Take 100 mg by mouth at bedtime. On hour before bedtime   Yes [provider]  venlafaxine XR (EFFEXOR-XR) 150 MG 24 hr capsule Take 150 mg by mouth daily after breakfast. Take with 75 mg for a total of 225 mg after breakfast 11/02/19  Yes [provider]  venlafaxine XR (EFFEXOR-XR) 75 MG 24 hr capsule Take 75 mg by mouth  daily after breakfast. Take with 150 mg for  a total of 225 mg after breakfast   Yes [provider]  Multiple Vitamin (MULTIVITAMIN WITH MINERALS) TABS tablet Take 1 tablet by mouth daily after breakfast. Seniors    [provider]   No results found.  Positive ROS: All other systems have been reviewed and were otherwise negative with the exception of those mentioned in the HPI and as above.  Physical Exam: General: Alert, no acute distress Cardiovascular: No pedal edema Respiratory: No cyanosis, no use of accessory musculature GI: No organomegaly, abdomen is soft and non-tender Skin: No lesions in the area of chief complaint Neurologic: Sensation intact distally Psychiatric: Patient is competent for consent with normal mood and affect Lymphatic: No axillary or cervical lymphadenopathy  MUSCULOSKELETAL:  Right upper extremity is warm and well-perfused.  No open wounds or lesions.  Assessment: Right shoulder rotator cuff arthropathy  Plan: Plan to proceed with reverse arthroplasty today for definitive treatment.  We again discussed the risk of bleeding, infection, damage to surrounding nerves and vessels, stiffness, dislocation, fracture and the risk of anesthesia.  She has provided informed consent.  We will plan to admit for OT and pain control and dispo planning    Nicholes Stairs, MD Cell [phone]    02/19/2022 7:09 AM

## 2022-02-20 LAB — CBC
HCT: 37.5 % (ref 36.0–46.0)
Hemoglobin: 12 g/dL (ref 12.0–15.0)
MCH: 36.3 pg — ABNORMAL HIGH (ref 26.0–34.0)
MCHC: 32 g/dL (ref 30.0–36.0)
MCV: 113.3 fL — ABNORMAL HIGH (ref 80.0–100.0)
Platelets: 246 10*3/uL (ref 150–400)
RBC: 3.31 MIL/uL — ABNORMAL LOW (ref 3.87–5.11)
RDW: 13.2 % (ref 11.5–15.5)
WBC: 17.7 10*3/uL — ABNORMAL HIGH (ref 4.0–10.5)
nRBC: 0 % (ref 0.0–0.2)

## 2022-02-20 LAB — BASIC METABOLIC PANEL
Anion gap: 8 (ref 5–15)
BUN: 10 mg/dL (ref 8–23)
CO2: 27 mmol/L (ref 22–32)
Calcium: 8.9 mg/dL (ref 8.9–10.3)
Chloride: 102 mmol/L (ref 98–111)
Creatinine, Ser: 0.6 mg/dL (ref 0.44–1.00)
GFR, Estimated: >60 mL/min (ref >60)
Glucose, Bld: 154 mg/dL — ABNORMAL HIGH (ref 70–99)
Potassium: 3.9 mmol/L (ref 3.5–5.1)
Sodium: 137 mmol/L (ref 135–145)

## 2022-02-20 LAB — GLUCOSE, CAPILLARY
Glucose-Capillary: 152 mg/dL — ABNORMAL HIGH (ref 70–99)
Glucose-Capillary: 163 mg/dL — ABNORMAL HIGH (ref 70–99)
Glucose-Capillary: 126 mg/dL — ABNORMAL HIGH (ref 70–99)
Glucose-Capillary: 155 mg/dL — ABNORMAL HIGH (ref 70–99)

## 2022-02-20 MED ORDER — OXYCODONE-ACETAMINOPHEN 7.5-325 MG PO TABS: 1.0000 | ORAL_TABLET | ORAL | 0 refills | Status: AC | PRN

## 2022-02-20 MED ORDER — ALBUTEROL SULFATE (2.5 MG/3ML) 0.083% IN NEBU: 2.5000 mg | INHALATION_SOLUTION | RESPIRATORY_TRACT | Status: DC | PRN

## 2022-02-20 MED ORDER — ALBUTEROL SULFATE HFA 108 (90 BASE) MCG/ACT IN AERS: 2.0000 | INHALATION_SPRAY | Freq: Two times a day (BID) | RESPIRATORY_TRACT | Status: DC

## 2022-02-20 MED ORDER — ONDANSETRON HCL 4 MG PO TABS: 4.0000 mg | ORAL_TABLET | Freq: Three times a day (TID) | ORAL | 0 refills | Status: AC | PRN

## 2022-02-20 MED ORDER — ALBUTEROL SULFATE HFA 108 (90 BASE) MCG/ACT IN AERS: 2.0000 | INHALATION_SPRAY | RESPIRATORY_TRACT | Status: DC | PRN

## 2022-02-20 MED ORDER — POLYETHYLENE GLYCOL 3350 17 G PO PACK
17.0000 g | PACK | Freq: Every day | ORAL | Status: DC
  Administered 2022-02-20 – 2022-02-21 (×2): 17 g via ORAL
  Filled 2022-02-20 (×2): qty 1

## 2022-02-20 NOTE — Plan of Care (Signed)
  Problem: Education: Goal: Understanding of activity limitations/precautions following surgery will improve Outcome: Progressing   Problem: Activity: Goal: Ability to tolerate increased activity will improve Outcome: Progressing   Problem: Pain Management: Goal: Pain level will decrease with appropriate interventions Outcome: Progressing

## 2022-02-20 NOTE — Evaluation (Signed)
Occupational Therapy Evaluation Patient Details Name: Kara Lamb MRN: [id number] DOB: [birth year] Today's Date: 02/20/2022   History of Present Illness Kara Lamb is a 66 y.o. female who complains of right shoulder pain and dysfunction. Patient is s/p R reverse total shoulder arthroplasty.   Clinical Impression   Kara Lamb is a 66 year old woman s/p shoulder replacement without functional use of right dominant upper extremity secondary to effects of surgery and interscalene block and shoulder precautions. Therapist provided education and instruction to patient in regards to exercises, precautions, positioning, donning upper extremity clothing and bathing while maintaining shoulder precautions, ice and edema management and donning/doffing sling. Patient and verbalized understanding and demonstrated as needed. Patient reports purchasing multiple items for home use to be modified independent. She has a SO but will not rely on him for ADLs. She requires verbal cues to keep her on task. OT will follow up with patient while in hospital to maximize retention of education and safety prior to patient's discharge.       Recommendations for follow up therapy are one component of a multi-disciplinary discharge planning process, led by the attending physician.  Recommendations may be updated based on patient status, additional functional criteria and insurance authorization.   Follow Up Recommendations  Follow physician's recommendations for discharge plan and follow up therapies    Assistance Recommended at Discharge Intermittent Supervision/Assistance  Patient can return home with the following Assistance with cooking/housework    Functional Status Assessment  Patient has had a recent decline in their functional status and demonstrates the ability to make significant improvements in function in a reasonable and predictable amount of time.  Equipment Recommendations  Other (comment)  (Bidet)    Recommendations for Other Services       Precautions / Restrictions Precautions Precautions: Shoulder Type of Shoulder Precautions: AROM/PROM of shoulder flexion 0-90; no abduction; AROM/PROM of shoulder external rotation 0-30 Shoulder Interventions: Shoulder sling/immobilizer Precaution Booklet Issued: Yes (comment) (handouts) Required Braces or Orthoses: Sling Restrictions Weight Bearing Restrictions: Yes RUE Weight Bearing: Non weight bearing      Mobility Bed Mobility                    Transfers                          Balance Overall balance assessment: Mild deficits observed, not formally tested                                         ADL either performed or assessed with clinical judgement   ADL Overall ADL's : Needs assistance/impaired Eating/Feeding: Modified independent   Grooming: Modified independent   Upper Body Bathing: Minimal assistance   Lower Body Bathing: Modified independent   Upper Body Dressing : Minimal assistance;Cueing for UE precautions   Lower Body Dressing: Modified independent   Toilet Transfer: Independent   Toileting- Clothing Manipulation and Hygiene: Modified independent;Sit to/from stand       Functional mobility during ADLs: Independent       Vision Patient Visual Report: No change from baseline Vision Assessment?: No apparent visual deficits     Perception     Praxis      Pertinent Vitals/Pain       Hand Dominance Right   Extremity/Trunk Assessment Upper Extremity Assessment Upper Extremity Assessment: RUE deficits/detail  RUE Deficits / Details: impaired motor control and sensation secondary to block   Lower Extremity Assessment Lower Extremity Assessment: Overall WFL for tasks assessed   Cervical / Trunk Assessment Cervical / Trunk Assessment: Normal   Communication Communication Communication: No difficulties   Cognition                                              General Comments       Exercises     Shoulder Instructions Shoulder Instructions Donning/doffing shirt without moving shoulder: Independent Method for sponge bathing under operated UE: Independent Donning/doffing sling/immobilizer: Independent Correct positioning of sling/immobilizer: Independent ROM for elbow, wrist and digits of operated UE: Independent Sling wearing schedule (on at all times/off for ADL's): Independent Proper positioning of operated UE when showering: Independent Dressing change: Independent Positioning of UE while sleeping: [place] expects to be discharged to:: Private residence Living Arrangements: Spouse/significant other Available Help at Discharge: Available PRN/intermittently [place]: One level     Bathroom Shower/Tub: Teacher, early years/pre: Standard     Home Equipment: Tub bench   Additional Comments: has purchased toilet aide, an attachement to sink to assist wtih cleaning.      Prior Functioning/Environment Prior Level of Function : Independent/Modified Independent                        OT Problem List: Decreased strength;Decreased range of motion;Impaired UE functional use;Pain      OT Treatment/Interventions: Self-care/ADL training;Therapeutic exercise;DME and/or AE instruction;Therapeutic activities;Balance training;Patient/family education    OT Goals(Current goals can be found in the care plan section) Acute Rehab OT Goals Patient Stated Goal: to be completely independent OT Goal Formulation: With patient Time For Goal Achievement: 03/06/22 Potential to Achieve Goals: Good  OT Frequency: Min 1X/week    Co-evaluation              AM-PAC OT "6 Clicks" Daily Activity     Outcome Measure Help from another person eating meals?: None Help from another person taking care of personal grooming?: None Help  from another person toileting, which includes using toliet, bedpan, or urinal?: None Help from another person bathing (including washing, rinsing, drying)?: A Little Help from another person to put on and taking off regular upper body clothing?: A Little Help from another person to put on and taking off regular lower body clothing?: A Little 6 Click Score: 21   End of Session Nurse Communication:  (okay to see)  Activity Tolerance: Patient tolerated treatment well Patient left: in bed;with call bell/phone within reach  OT Visit Diagnosis: Muscle weakness (generalized) (M62.81);Pain                Time: 3976-7341 OT Time Calculation (min): 40 min Charges:  OT General Charges $OT Visit: 1 Visit OT Evaluation $OT Eval Low Complexity: 1 Low OT Treatments $Self Care/Home Management : 23-37 mins  [name], OTR/L [place]  Office [phone]   [name] 02/20/2022, 12:38 PM

## 2022-02-20 NOTE — Progress Notes (Signed)
   Subjective:  Patient reports pain as moderate to severe.  Complaining of moderate to severe pain today.  Mostly complaining of left shoulder pain.  She has significant arthritis there.  Also now having some pain in the right forearm and elbow.  No shortness of breath or diaphoresis or chills.  Objective:   VITALS:   Vitals:   02/19/22 1711 02/19/22 2012 02/20/22 0127 02/20/22 0549  BP:  119/71 107/65 (!) 102/56  Pulse:  70 68 63  Resp:  '16 18 18  '$ Temp:  98.3 F (36.8 C) 97.9 F (36.6 C) 98 F (36.7 C)  TempSrc:  Oral Oral   SpO2:  93% 97% 99%  Weight:      Height: 5' (1.524 m)       Neurologically intact Neurovascular intact Sensation intact distally Intact pulses distally Incision: dressing C/D/I and no drainage Compartment soft Sling in place Wiggles fingers with no pain  Lab Results  Component Value Date   WBC 17.7 (H) 02/20/2022   HGB 12.0 02/20/2022   HCT 37.5 02/20/2022   MCV 113.3 (H) 02/20/2022   PLT 246 02/20/2022   BMET    Component Value Date/Time   NA 137 02/20/2022 0333   K 3.9 02/20/2022 0333   CL 102 02/20/2022 0333   CO2 27 02/20/2022 0333   GLUCOSE 154 (H) 02/20/2022 0333   BUN 10 02/20/2022 0333   CREATININE 0.60 02/20/2022 0333   CREATININE 0.81 11/15/2019 1022   CALCIUM 8.9 02/20/2022 0333   GFRNONAA >60 02/20/2022 0333   GFRNONAA 77 11/15/2019 1022     Assessment/Plan: 1 Day Post-Op   Principal Problem:   S/P reverse total shoulder arthroplasty, right   Advance diet Up with therapy -Okay for ADLs the right upper extremity.  May remove sling for active range of motion per protocol but otherwise in sling when sleeping and not working with ADLs or exercises.  -Patient does live alone and is worried about going home.  We will keep her overnight for pain control as well as therapy.  We will see how she progresses.  -Hopefully we can get her to discharge home after a couple days in the inpatient setting.   Nicholes Stairs 02/20/2022, 8:55 AM   Geralynn Rile, MD [phone]

## 2022-02-21 LAB — CBC
HCT: 36.4 % (ref 36.0–46.0)
Hemoglobin: 11.8 g/dL — ABNORMAL LOW (ref 12.0–15.0)
MCH: 36.6 pg — ABNORMAL HIGH (ref 26.0–34.0)
MCHC: 32.4 g/dL (ref 30.0–36.0)
MCV: 113 fL — ABNORMAL HIGH (ref 80.0–100.0)
Platelets: 215 10*3/uL (ref 150–400)
RBC: 3.22 MIL/uL — ABNORMAL LOW (ref 3.87–5.11)
RDW: 13.2 % (ref 11.5–15.5)
WBC: 10.6 10*3/uL — ABNORMAL HIGH (ref 4.0–10.5)
nRBC: 0 % (ref 0.0–0.2)

## 2022-02-21 LAB — GLUCOSE, CAPILLARY
Glucose-Capillary: 230 mg/dL — ABNORMAL HIGH (ref 70–99)
Glucose-Capillary: 157 mg/dL — ABNORMAL HIGH (ref 70–99)

## 2022-02-21 MED ORDER — LIP MEDEX EX OINT
TOPICAL_OINTMENT | CUTANEOUS | Status: DC | PRN
  Filled 2022-02-21: qty 7

## 2022-02-21 NOTE — Plan of Care (Signed)
  Problem: Education: Goal: Understanding of activity limitations/precautions following surgery will improve Outcome: Progressing   Problem: Activity: Goal: Ability to tolerate increased activity will improve Outcome: Progressing   Problem: Pain Management: Goal: Pain level will decrease with appropriate interventions Outcome: Progressing

## 2022-02-21 NOTE — Progress Notes (Signed)
Subjective: 2 Days Post-Op Procedure(s) (LRB): REVERSE SHOULDER ARTHROPLASTY (Right)  Patient reports pain as mild to moderate.  Denies fever, chills, N/V, CP, SOB.  Tolerating POs well.  Admits to flatus.  Notes that her pain is significantly improved in comparison to yesterday and that she is ready to go home.  Objective:   VITALS:  Temp:  [98 F (36.7 C)-98.2 F (36.8 C)] 98.2 F (36.8 C) (09/10 0600) Pulse Rate:  [74-87] 74 (09/10 0600) Resp:  [16-18] 16 (09/10 0600) BP: (106-134)/(63-65) 134/65 (09/10 0600) SpO2:  [94 %-99 %] 94 % (09/10 0600)  General: WDWN patient in NAD. Psych:  Appropriate mood and affect. Neuro:  A&O x 3, Moving all extremities, sensation intact to light touch HEENT:  EOMs intact Chest:  Even non-labored respirations Skin:  Dressing/sling C/D/I, no rashes or lesions Extremities: warm/dry, mild edema to R shoulder, no erythema or echymosis.  No lymphadenopathy. Pulses: Radial 2+ MSK:  ROM: Full R wirst ROM, Grip strength: 4/5   LABS Recent Labs    02/20/22 0333 02/21/22 0335  HGB 12.0 11.8*  WBC 17.7* 10.6*  PLT 246 215   Recent Labs    02/20/22 0333  NA 137  K 3.9  CL 102  CO2 27  BUN 10  CREATININE 0.60  GLUCOSE 154*   No results for input(s): "LABPT", "INR" in the last 72 hours.   Assessment/Plan: 2 Days Post-Op Procedure(s) (LRB): REVERSE SHOULDER ARTHROPLASTY (Right)  Patient seen in rounds for Dr. Stann Mainland. NWB R UE Sling to R UE D/C home today after working with therapy. D/C scripts sent to patient's pharmacy Plan for outpatient post-op visit with Dr. Stann Mainland.   Mechele Claude PA-C [facility] Office:  [phone]

## 2022-02-21 NOTE — TOC Transition Note (Signed)
Transition of Care [facility]) - CM/SW Discharge Note   Patient Details  Name: Jenilee Franey MRN: [id number] Date of Birth: 1956-02-27  Transition of Care [facility] CM/SW Contact:  Ross Ludwig, LCSW Phone Number: 02/21/2022, 12:33 PM   Clinical Narrative:     Patient has been preplanned for outpatient therapy.  CSW signing off, please reconsult if other social work needs arise.     Final next level of care: OP Rehab Barriers to Discharge: Barriers Resolved   Patient Goals and CMS Choice Patient states their goals for this hospitalization and ongoing recovery are:: To return back home and go to outpatient therapy.      Discharge Placement                       Discharge Plan and Services                                     Social Determinants of Health (SDOH) Interventions     Readmission Risk Interventions     No data to display

## 2022-02-21 NOTE — Progress Notes (Signed)
Occupational Therapy Treatment Patient Details Name: Kara Lamb MRN: [id number] DOB: 15-Apr-1956 Today's Date: 02/21/2022   History of present illness Kara Lamb is a 66 y.o. female who complains of right shoulder pain and dysfunction. Patient is s/p R reverse total shoulder arthroplasty.   OT comments  Treatment focused on reiteration of all education and instructions. Patient went through exercises with therapist. Therapist able to to passively range right shoulder to approx 70 degrees and shoed patient how to actively assist. Patient demonstrated ability to don sling. Patient verbalized understanding of all instruction and has handouts. Patient reports she will discharge today.    Recommendations for follow up therapy are one component of a multi-disciplinary discharge planning process, led by the attending physician.  Recommendations may be updated based on patient status, additional functional criteria and insurance authorization.    Follow Up Recommendations  Follow physician's recommendations for discharge plan and follow up therapies    Assistance Recommended at Discharge Intermittent Supervision/Assistance  Patient can return home with the following  Assistance with cooking/housework   Equipment Recommendations  None recommended by OT    Recommendations for Other Services      Precautions / Restrictions Precautions Precautions: Shoulder Type of Shoulder Precautions: AROM/PROM of shoulder flexion 0-90; no abduction; AROM/PROM of shoulder external rotation 0-30 Shoulder Interventions: Shoulder sling/immobilizer Precaution Booklet Issued:  (handouts) Required Braces or Orthoses: Sling Restrictions Weight Bearing Restrictions: Yes RUE Weight Bearing: Non weight bearing       Mobility Bed Mobility                    Transfers                         Balance                                           ADL either performed  or assessed with clinical judgement   ADL                                              Extremity/Trunk Assessment Upper Extremity Assessment RUE Deficits / Details: able to control elbow, forearm, wrist and hand today. Able to achieve approx 70 degrees PROM forward flexion. Limited by pain. RUE Coordination: WNL   Lower Extremity Assessment Lower Extremity Assessment: Overall WFL for tasks assessed   Cervical / Trunk Assessment Cervical / Trunk Assessment: Normal    Vision Patient Visual Report: No change from baseline     Perception     Praxis      Cognition Arousal/Alertness: Awake/alert Behavior During Therapy: WFL for tasks assessed/performed Overall Cognitive Status: Within Functional Limits for tasks assessed                                          Exercises Shoulder Exercises Shoulder Flexion: PROM, 5 reps, Limitations (to 90 degrees - patient only able to tolerate 70 degrees) Shoulder External Rotation: AROM, Limitations, Right Shoulder External Rotation Limitations: to 30 degreres Elbow Flexion: 10 reps, AROM, Right Wrist Extension: 10 reps, AROM, Right Digit Composite Flexion: 10 reps, AROM, Right  Shoulder Instructions Shoulder Instructions Donning/doffing shirt without moving shoulder: Independent Method for sponge bathing under operated UE: Independent Donning/doffing sling/immobilizer: Independent Correct positioning of sling/immobilizer: Independent Pendulum exercises (written home exercise program):  (Hand written HEP for elbow, wrist and hand. Showed patinet how to acitvley raise shoulder in forward flexion. Showed and educated patienton external rotation to 30 degrees.) ROM for elbow, wrist and digits of operated UE: Independent Sling wearing schedule (on at all times/off for ADL's): Independent Proper positioning of operated UE when showering: Independent Dressing change: Independent Positioning of UE while  sleeping: Independent     General Comments      Pertinent Vitals/ Pain       Pain Assessment Pain Assessment: 0-10 Pain Score: 6  Pain Location: R shoulder Pain Descriptors / Indicators: Grimacing, Aching Pain Intervention(s): Monitored during session, Limited activity within patient's tolerance  Home Living                                          Prior Functioning/Environment              Frequency  Min 1X/week        Progress Toward Goals  OT Goals(current goals can now be found in the care plan section)  Progress towards OT goals: Progressing toward goals  Acute Rehab OT Goals OT Goal Formulation: With patient Time For Goal Achievement: 03/06/22 Potential to Achieve Goals: Good  Plan Discharge plan remains appropriate    Co-evaluation                 AM-PAC OT "6 Clicks" Daily Activity     Outcome Measure   Help from another person eating meals?: None Help from another person taking care of personal grooming?: None Help from another person toileting, which includes using toliet, bedpan, or urinal?: None Help from another person bathing (including washing, rinsing, drying)?: A Little Help from another person to put on and taking off regular upper body clothing?: A Little Help from another person to put on and taking off regular lower body clothing?: A Little 6 Click Score: 21    End of Session    OT Visit Diagnosis: Muscle weakness (generalized) (M62.81);Pain   Activity Tolerance Patient tolerated treatment well   Patient Left in chair;with call bell/phone within reach   Nurse Communication  (OKay to discharge. education complete)        Time: 0272-5366 OT Time Calculation (min): 15 min  Charges: OT General Charges $OT Visit: 1 Visit OT Evaluation $OT Eval Low Complexity: 1 Low OT Treatments $Self Care/Home Management : 8-22 mins  [name], OTR/L [name]  Office [phone]   [name] 02/21/2022, 10:31 AM

## 2022-02-21 NOTE — Discharge Summary (Signed)
Physician Discharge Summary  Patient ID: Kara Lamb MRN: [id number] DOB/AGE: 66-Jun-1957 66 y.o.  Admit date: 02/19/2022 Discharge date: 02/21/2022  Admission Diagnoses: R shoulder OA; hx of dyslipidemia, HTN, bipolar, borderline behavior, GAD, asthmatic bronchitis, CAD, chronic chest pain, H/o suicide attempt, obesity, diabetes, GERD, abnormal myocardial perfusion study, tobacco use disorder  Discharge Diagnoses:  Principal Problem:   S/P reverse total shoulder arthroplasty, right Same as above  Discharged Condition: stable  Hospital Course: Patient presented to [facility] for elective R reverse TSA by Dr. Victorino December on 02/19/22.  The patient tolerated the procedure well without complication.  She was admitted to the hospital for pain control and to work with therapy.  She tolerated her stay without difficulty.  She is to be D/C'd home on 02/21/22.  Consults: PT/OT  Significant Diagnostic Studies: radiology: X-Ray: To ensure satisfactory alignment during operative procedure.  Treatments: IV hydration, antibiotics: Ancef, analgesia: acetaminophen, Morphine, oxycodone and norco, cardiac meds: irbesartan and SL nitroglycerin, anticoagulation: ASA and Plavix, and surgery: as stated above  Discharge Exam: Blood pressure 134/65, pulse 74, temperature 98.2 F (36.8 C), temperature source Oral, resp. rate 16, height 5' (1.524 m), weight 98.9 kg, SpO2 94 %. General: WDWN patient in NAD. Psych:  Appropriate mood and affect. Neuro:  A&O x 3, Moving all extremities, sensation intact to light touch HEENT:  EOMs intact Chest:  Even non-labored respirations Skin:  Dressing/sling C/D/I, no rashes or lesions Extremities: warm/dry, mild edema to R shoulder, no erythema or echymosis.  No lymphadenopathy. Pulses: Radial 2+ MSK:  ROM: Full R wrist ROM, Grip strength 4/5  Disposition: Discharge disposition: 01-Home or Self Care       Discharge Instructions     Call MD / Call 911    Complete by: As directed    If you experience chest pain or shortness of breath, CALL 911 and be transported to the hospital emergency room.  If you develope a fever above 101 F, pus (white drainage) or increased drainage or redness at the wound, or calf pain, call your surgeon's office.   Constipation Prevention   Complete by: As directed    Drink plenty of fluids.  Prune juice may be helpful.  You may use a stool softener, such as Colace (over the counter) 100 mg twice a day.  Use MiraLax (over the counter) for constipation as needed.   Diet - low sodium heart healthy   Complete by: As directed    Increase activity slowly as tolerated   Complete by: As directed    Non weight bearing   Complete by: As directed    Laterality: right   Extremity: Upper   Post-operative opioid taper instructions:   Complete by: As directed    POST-OPERATIVE OPIOID TAPER INSTRUCTIONS: It is important to wean off of your opioid medication as soon as possible. If you do not need pain medication after your surgery it is ok to stop day one. Opioids include: Codeine, Hydrocodone(Norco, Vicodin), Oxycodone(Percocet, oxycontin) and hydromorphone amongst others.  Long term and even short term use of opiods can cause: Increased pain response Dependence Constipation Depression Respiratory depression And more.  Withdrawal symptoms can include Flu like symptoms Nausea, vomiting And more Techniques to manage these symptoms Hydrate well Eat regular healthy meals Stay active Use relaxation techniques(deep breathing, meditating, yoga) Do Not substitute Alcohol to help with tapering If you have been on opioids for less than two weeks and do not have pain than it is ok to  stop all together.  Plan to wean off of opioids This plan should start within one week post op of your joint replacement. Maintain the same interval or time between taking each dose and first decrease the dose.  Cut the total daily intake of  opioids by one tablet each day Next start to increase the time between doses. The last dose that should be eliminated is the evening dose.         Allergies as of 02/21/2022       Reactions   Heparin Other (See Comments)   Injections in the stomach causes huge knots, tenderness, and pain for weeks (hematomas)   Imdur [isosorbide Nitrate] Other (See Comments)   Low blood pressure   Nitroglycerin Other (See Comments)   Patches--Migraine Drip--nausea and vomiting   Paroxetine Hcl Diarrhea   Severe   Perphenazine Other (See Comments)   Cannot recall   Zoloft [sertraline Hcl] Other (See Comments)   Severe diarrhea   Zolpidem Other (See Comments)   Sleep walking and loss of memory        Medication List     TAKE these medications    albuterol 108 (90 Base) MCG/ACT inhaler Commonly known as: VENTOLIN HFA Inhale 2 puffs into the lungs every 4 (four) hours as needed.   aspirin 81 MG chewable tablet Chew 81 mg by mouth daily.   atorvastatin 40 MG tablet Commonly known as: LIPITOR Take 1 tablet (40 mg total) by mouth daily at 6 PM. NEEDS APPOINTMENT FOR FUTURE REFILLS What changed: when to take this   clopidogrel 75 MG tablet Commonly known as: PLAVIX Take 1 tablet (75 mg total) by mouth daily. NEEDS APPOINTMENT FOR FUTURE REFILLS OR 90-DAY SUPPLY What changed: when to take this   diazepam 5 MG tablet Commonly known as: VALIUM Take 5-10 mg by mouth daily as needed for anxiety (anxiety).   famotidine 40 MG tablet Commonly known as: PEPCID Take 40 mg by mouth daily. 360   furosemide 20 MG tablet Commonly known as: LASIX Take 20 mg by mouth daily.   Jardiance 10 MG Tabs tablet Generic drug: empagliflozin Take 10 mg by mouth daily.   lamoTRIgine 200 MG tablet Commonly known as: LAMICTAL Take 200 mg by mouth daily after breakfast.   levothyroxine 88 MCG tablet Commonly known as: SYNTHROID Take 88 mcg by mouth daily before breakfast.   loperamide 2 MG  tablet Commonly known as: IMODIUM A-D Take 2 mg by mouth 4 (four) times daily as needed for diarrhea or loose stools.   methocarbamol 500 MG tablet Commonly known as: Robaxin Take 1 tablet (500 mg total) by mouth every 6 (six) hours as needed for muscle spasms.   metoprolol tartrate 25 MG tablet Commonly known as: LOPRESSOR Take 1 tablet (25 mg total) by mouth 2 (two) times daily. NEEDS APPOINTMENT FOR FUTURE REFILLS OR 90-DAY SUPPLY What changed:  when to take this additional instructions   multivitamin with minerals Tabs tablet Take 1 tablet by mouth daily after breakfast. Seniors   nicotine 21 mg/24hr patch Commonly known as: NICODERM CQ - dosed in mg/24 hours Place 14 mg onto the skin every morning.   nitroGLYCERIN 0.4 MG SL tablet Commonly known as: NITROSTAT Place 1 tablet (0.4 mg total) under the tongue every 5 (five) minutes as needed for chest pain. What changed: when to take this   olmesartan 5 MG tablet Commonly known as: BENICAR Take 5 mg by mouth daily after breakfast.   ondansetron 4 MG  tablet Commonly known as: Zofran Take 1 tablet (4 mg total) by mouth every 8 (eight) hours as needed for nausea or vomiting.   oxyCODONE-acetaminophen 7.5-325 MG tablet Commonly known as: Percocet Take 1 tablet by mouth every 4 (four) hours as needed for severe pain.   pantoprazole 40 MG tablet Commonly known as: PROTONIX Take 40 mg by mouth daily. One hour before coffee   prazosin 1 MG capsule Commonly known as: MINIPRESS Take 2 mg by mouth at bedtime. One hour before bedtime   ranolazine 500 MG 12 hr tablet Commonly known as: RANEXA Take 500 mg by mouth in the morning and at bedtime.   topiramate 50 MG tablet Commonly known as: TOPAMAX Take 50 mg by mouth at bedtime.   traZODone 100 MG tablet Commonly known as: DESYREL Take 100 mg by mouth at bedtime. On hour before bedtime   venlafaxine XR 75 MG 24 hr capsule Commonly known as: EFFEXOR-XR Take 75 mg by  mouth daily after breakfast. Take with 150 mg for  a total of 225 mg after breakfast   venlafaxine XR 150 MG 24 hr capsule Commonly known as: EFFEXOR-XR Take 150 mg by mouth daily after breakfast. Take with 75 mg for a total of 225 mg after breakfast               Discharge Care Instructions  (From admission, onward)           Start     Ordered   02/21/22 0000  Non weight bearing       Question Answer Comment  Laterality right   Extremity Upper      02/21/22 0843            Follow-up Information     Nicholes Stairs, MD Follow up in 2 week(s).   Specialty: Orthopedic Surgery Why: For wound re-check Contact information: [address] [place] [address] [phone]                 Signed: Mohammed Kindle Office:  [phone]

## 2022-02-22 ENCOUNTER — Encounter (HOSPITAL_COMMUNITY): Payer: Self-pay | Admitting: Orthopedic Surgery

## 2022-10-08 ENCOUNTER — Encounter (HOSPITAL_COMMUNITY): Payer: Self-pay

## 2022-10-08 ENCOUNTER — Emergency Department (HOSPITAL_COMMUNITY)
Admission: EM | Admit: 2022-10-08 | Discharge: 2022-10-12 | Disposition: A | Discharge status: Other Acute Inpatient Hospital | Payer: Medicare HMO | Attending: Emergency Medicine | Admitting: Emergency Medicine

## 2022-10-08 DIAGNOSIS — Z87891 Personal history of nicotine dependence: Secondary | ICD-10-CM | POA: Diagnosis not present

## 2022-10-08 DIAGNOSIS — Z20822 Contact with and (suspected) exposure to covid-19: Secondary | ICD-10-CM | POA: Diagnosis not present

## 2022-10-08 DIAGNOSIS — E039 Hypothyroidism, unspecified: Secondary | ICD-10-CM | POA: Diagnosis not present

## 2022-10-08 DIAGNOSIS — I10 Essential (primary) hypertension: Secondary | ICD-10-CM | POA: Insufficient documentation

## 2022-10-08 DIAGNOSIS — Z7982 Long term (current) use of aspirin: Secondary | ICD-10-CM | POA: Insufficient documentation

## 2022-10-08 DIAGNOSIS — I2510 Atherosclerotic heart disease of native coronary artery without angina pectoris: Secondary | ICD-10-CM | POA: Diagnosis not present

## 2022-10-08 DIAGNOSIS — Z7984 Long term (current) use of oral hypoglycemic drugs: Secondary | ICD-10-CM | POA: Diagnosis not present

## 2022-10-08 DIAGNOSIS — E119 Type 2 diabetes mellitus without complications: Secondary | ICD-10-CM | POA: Insufficient documentation

## 2022-10-08 DIAGNOSIS — Z96611 Presence of right artificial shoulder joint: Secondary | ICD-10-CM | POA: Diagnosis not present

## 2022-10-08 DIAGNOSIS — F32A Depression, unspecified: Secondary | ICD-10-CM

## 2022-10-08 DIAGNOSIS — Z7902 Long term (current) use of antithrombotics/antiplatelets: Secondary | ICD-10-CM | POA: Insufficient documentation

## 2022-10-08 DIAGNOSIS — T1491XA Suicide attempt, initial encounter: Secondary | ICD-10-CM | POA: Diagnosis present

## 2022-10-08 DIAGNOSIS — J45909 Unspecified asthma, uncomplicated: Secondary | ICD-10-CM | POA: Insufficient documentation

## 2022-10-08 DIAGNOSIS — T424X2A Poisoning by benzodiazepines, intentional self-harm, initial encounter: Secondary | ICD-10-CM | POA: Insufficient documentation

## 2022-10-08 DIAGNOSIS — F322 Major depressive disorder, single episode, severe without psychotic features: Secondary | ICD-10-CM | POA: Diagnosis present

## 2022-10-08 DIAGNOSIS — F329 Major depressive disorder, single episode, unspecified: Secondary | ICD-10-CM | POA: Diagnosis not present

## 2022-10-08 DIAGNOSIS — Z96642 Presence of left artificial hip joint: Secondary | ICD-10-CM | POA: Insufficient documentation

## 2022-10-08 DIAGNOSIS — Z79899 Other long term (current) drug therapy: Secondary | ICD-10-CM | POA: Diagnosis not present

## 2022-10-08 LAB — COMPREHENSIVE METABOLIC PANEL
ALT: 41 U/L (ref 0–44)
AST: 62 U/L — ABNORMAL HIGH (ref 15–41)
Albumin: 3.9 g/dL (ref 3.5–5.0)
Alkaline Phosphatase: 78 U/L (ref 38–126)
Anion gap: 13 (ref 5–15)
BUN: 16 mg/dL (ref 8–23)
CO2: 24 mmol/L (ref 22–32)
Calcium: 9.9 mg/dL (ref 8.9–10.3)
Chloride: 100 mmol/L (ref 98–111)
Creatinine, Ser: 0.66 mg/dL (ref 0.44–1.00)
GFR, Estimated: >60 mL/min (ref >60)
Glucose, Bld: 173 mg/dL — ABNORMAL HIGH (ref 70–99)
Potassium: 4.7 mmol/L (ref 3.5–5.1)
Sodium: 137 mmol/L (ref 135–145)
Total Bilirubin: 1.4 mg/dL — ABNORMAL HIGH (ref 0.3–1.2)
Total Protein: 7.5 g/dL (ref 6.5–8.1)

## 2022-10-08 LAB — ETHANOL: Alcohol, Ethyl (B): <10 mg/dL (ref <10)

## 2022-10-08 LAB — CBC WITH DIFFERENTIAL/PLATELET
Abs Immature Granulocytes: 0.14 10*3/uL — ABNORMAL HIGH (ref 0.00–0.07)
Basophils Absolute: 0.2 10*3/uL — ABNORMAL HIGH (ref 0.0–0.1)
Basophils Relative: 1 %
Eosinophils Absolute: 0.2 10*3/uL (ref 0.0–0.5)
Eosinophils Relative: 1 %
HCT: 47 % — ABNORMAL HIGH (ref 36.0–46.0)
Hemoglobin: 15.6 g/dL — ABNORMAL HIGH (ref 12.0–15.0)
Immature Granulocytes: 1 %
Lymphocytes Relative: 26 %
Lymphs Abs: 2.9 10*3/uL (ref 0.7–4.0)
MCH: 36.1 pg — ABNORMAL HIGH (ref 26.0–34.0)
MCHC: 33.2 g/dL (ref 30.0–36.0)
MCV: 108.8 fL — ABNORMAL HIGH (ref 80.0–100.0)
Monocytes Absolute: 0.9 10*3/uL (ref 0.1–1.0)
Monocytes Relative: 8 %
Neutro Abs: 6.9 10*3/uL (ref 1.7–7.7)
Neutrophils Relative %: 63 %
Platelets: 206 10*3/uL (ref 150–400)
RBC: 4.32 MIL/uL (ref 3.87–5.11)
RDW: 13.3 % (ref 11.5–15.5)
WBC: 11.2 10*3/uL — ABNORMAL HIGH (ref 4.0–10.5)
nRBC: 0 % (ref 0.0–0.2)

## 2022-10-08 LAB — ACETAMINOPHEN LEVEL: Acetaminophen (Tylenol), Serum: <10 ug/mL — ABNORMAL LOW (ref 10–30)

## 2022-10-08 LAB — SALICYLATE LEVEL: Salicylate Lvl: <7 mg/dL — ABNORMAL LOW (ref 7.0–30.0)

## 2022-10-08 MED ORDER — ALUM & MAG HYDROXIDE-SIMETH 200-200-20 MG/5ML PO SUSP
30.0000 mL | Freq: Once | ORAL | Status: AC
  Administered 2022-10-08: 30 mL via ORAL
  Filled 2022-10-08: qty 30

## 2022-10-08 NOTE — ED Notes (Signed)
Poison Controlled contacted by RN. PC recommendations are EKG, OBS for at least 4 hours, repeat tylenol level in 4 hours, and supportive care if needed.

## 2022-10-08 NOTE — ED Triage Notes (Signed)
Pt reports taking Valium 250mg , 20 Trazodone 100mg  5 days ago as an attempted suicide. Pt reports that she took at least 10 Trazodone and a couple of Valium today.

## 2022-10-08 NOTE — ED Provider Notes (Signed)
[place] EMERGENCY DEPARTMENT AT [facility] Provider Note   CSN: [id number] Arrival date & time: 10/08/22  2133     History {Add pertinent medical, surgical, social history, OB history to HPI:1} Suicide attempt, overdose  Kara Lamb is a 67 y.o. female.  HPI   Patient has a history of hypertension dyslipidemia, severe depression, coronary artery disease, bipolar disorder, borderline disorder, history of suicide attempts, tobacco use disorder who presents to the ED after an overdose.  Patient states she has been estranged from her daughters and grandchildren.  Her grandson passed away several months ago from a medical illness.  He would be 7 today.  Patient was not allowed to see him.  Patient has been very depressed and overdosed on pills.  Patient states she took Valium and trazodone 5 days ago.  Patient also took 10 trazodone and a couple of Valium today around 7 PM.  Home Medications Prior to Admission medications   Medication Sig Start Date End Date Taking? Authorizing Provider  albuterol (VENTOLIN HFA) 108 (90 Base) MCG/ACT inhaler Inhale 2 puffs into the lungs every 4 (four) hours as needed. 01/18/21   [provider]  aspirin 81 MG chewable tablet Chew 81 mg by mouth daily. 06/06/13   [provider]  atorvastatin (LIPITOR) 40 MG tablet Take 1 tablet (40 mg total) by mouth daily at 6 PM. NEEDS APPOINTMENT FOR FUTURE REFILLS Patient taking differently: Take 40 mg by mouth daily after supper. NEEDS APPOINTMENT FOR FUTURE REFILLS 08/18/15   Rollene Rotunda, MD  clopidogrel (PLAVIX) 75 MG tablet Take 1 tablet (75 mg total) by mouth daily. NEEDS APPOINTMENT FOR FUTURE REFILLS OR 90-DAY SUPPLY Patient taking differently: Take 75 mg by mouth daily after supper. NEEDS APPOINTMENT FOR FUTURE REFILLS OR 90-DAY SUPPLY 08/18/15   Rollene Rotunda, MD  diazepam (VALIUM) 5 MG tablet Take 5-10 mg by mouth daily as needed for anxiety (anxiety). 11/09/19   [provider]  famotidine (PEPCID) 40 MG tablet Take 40 mg by mouth daily. 360    [provider]  furosemide (LASIX) 20 MG tablet Take 20 mg by mouth daily. 05/25/20   [provider]  JARDIANCE 10 MG TABS tablet Take 10 mg by mouth daily. 11/17/21   [provider]  lamoTRIgine (LAMICTAL) 200 MG tablet Take 200 mg by mouth daily after breakfast. 11/28/20   [provider]  levothyroxine (SYNTHROID) 88 MCG tablet Take 88 mcg by mouth daily before breakfast. 10/14/20   [provider]  loperamide (IMODIUM A-D) 2 MG tablet Take 2 mg by mouth 4 (four) times daily as needed for diarrhea or loose stools.    [provider]  methocarbamol (ROBAXIN) 500 MG tablet Take 1 tablet (500 mg total) by mouth every 6 (six) hours as needed for muscle spasms. 05/21/20   Lanney Gins, PA-C  metoprolol tartrate (LOPRESSOR) 25 MG tablet Take 1 tablet (25 mg total) by mouth 2 (two) times daily. NEEDS APPOINTMENT FOR FUTURE REFILLS OR 90-DAY SUPPLY Patient taking differently: Take 25 mg by mouth daily after breakfast. 08/18/15   Rollene Rotunda, MD  Multiple Vitamin (MULTIVITAMIN WITH MINERALS) TABS tablet Take 1 tablet by mouth daily after breakfast. Seniors    [provider]  nicotine (NICODERM CQ - DOSED IN MG/24 HOURS) 21 mg/24hr patch Place 14 mg onto the skin every morning.    [provider]  nitroGLYCERIN (NITROSTAT) 0.4 MG SL tablet Place 1 tablet (0.4 mg total) under the tongue every  5 (five) minutes as needed for chest pain. Patient taking differently: Place 0.4 mg under the tongue every 5 (five) minutes x 3 doses as needed for chest pain. 06/21/13   Rollene Rotunda, MD  olmesartan (BENICAR) 5 MG tablet Take 5 mg by mouth daily after breakfast.    [provider]  ondansetron (ZOFRAN) 4 MG tablet Take 1 tablet (4 mg total) by mouth every 8 (eight) hours as needed for nausea or vomiting. 02/20/22   Yolonda Kida, MD   oxyCODONE-acetaminophen (PERCOCET) 7.5-325 MG tablet Take 1 tablet by mouth every 4 (four) hours as needed for severe pain. 02/20/22 02/20/23  Yolonda Kida, MD  pantoprazole (PROTONIX) 40 MG tablet Take 40 mg by mouth daily. One hour before coffee 10/14/20   [provider]  prazosin (MINIPRESS) 1 MG capsule Take 2 mg by mouth at bedtime. One hour before bedtime 01/21/19   [provider]  ranolazine (RANEXA) 500 MG 12 hr tablet Take 500 mg by mouth in the morning and at bedtime.  11/24/19   [provider]  topiramate (TOPAMAX) 50 MG tablet Take 50 mg by mouth at bedtime.  11/02/19   [provider]  traZODone (DESYREL) 100 MG tablet Take 100 mg by mouth at bedtime. On hour before bedtime    [provider]  venlafaxine XR (EFFEXOR-XR) 150 MG 24 hr capsule Take 150 mg by mouth daily after breakfast. Take with 75 mg for a total of 225 mg after breakfast 11/02/19   [provider]  venlafaxine XR (EFFEXOR-XR) 75 MG 24 hr capsule Take 75 mg by mouth daily after breakfast. Take with 150 mg for  a total of 225 mg after breakfast    [provider]      Allergies    Heparin, Isosorbide nitrate, Nitroglycerin, Paroxetine, Paroxetine hcl, Perphenazine, Sertraline, Zoloft [sertraline hcl], and Zolpidem    Review of Systems   Review of Systems  Physical Exam Updated Vital Signs BP (!) 170/90 (BP Location: Right Arm)   Pulse 79   Temp 98.1 F (36.7 C) (Oral)   Resp 18   Ht 1.524 m (5')   Wt 97.5 kg   SpO2 94%   BMI 41.99 kg/m  Physical Exam Vitals and nursing note reviewed.  Constitutional:      Appearance: She is well-developed. She is not diaphoretic.  HENT:     Head: Normocephalic and atraumatic.     Right Ear: External ear normal.     Left Ear: External ear normal.  Eyes:     General: No scleral icterus.       Right eye: No discharge.        Left eye: No discharge.     Conjunctiva/sclera: Conjunctivae normal.  Neck:      Trachea: No tracheal deviation.  Cardiovascular:     Rate and Rhythm: Normal rate and regular rhythm.  Pulmonary:     Effort: Pulmonary effort is normal. No respiratory distress.     Breath sounds: Normal breath sounds. No stridor. No wheezing or rales.  Abdominal:     General: Bowel sounds are normal. There is no distension.     Palpations: Abdomen is soft.     Tenderness: There is no abdominal tenderness. There is no guarding or rebound.  Musculoskeletal:        General: No tenderness or deformity.     Cervical back: Neck supple.  Skin:    General: Skin is warm and dry.  Findings: No rash.  Neurological:     General: No focal deficit present.     Mental Status: She is alert.     Cranial Nerves: No cranial nerve deficit, dysarthria or facial asymmetry.     Sensory: No sensory deficit.     Motor: No abnormal muscle tone or seizure activity.     Coordination: Coordination normal.  Psychiatric:        Mood and Affect: Mood is depressed.        Thought Content: Thought content includes suicidal ideation.     ED Results / Procedures / Treatments   Labs (all labs ordered are listed, but only abnormal results are displayed) Labs Reviewed - No data to display  EKG None  Radiology No results found.  Procedures Procedures  {Document cardiac monitor, telemetry assessment procedure when appropriate:1}  Medications Ordered in ED Medications - No data to display  ED Course/ Medical Decision Making/ A&P   {   Click here for ABCD2, HEART and other calculatorsREFRESH Note before signing :1}                          Medical Decision Making Amount and/or Complexity of Data Reviewed Labs: ordered.  Risk OTC drugs.   ***  {Document critical care time when appropriate:1} {Document review of labs and clinical decision tools ie heart score, Chads2Vasc2 etc:1}  {Document your independent review of radiology images, and any outside records:1} {Document your discussion with  family members, caretakers, and with consultants:1} {Document social determinants of health affecting pt's care:1} {Document your decision making why or why not admission, treatments were needed:1} Final Clinical Impression(s) / ED Diagnoses Final diagnoses:  None    Rx / DC Orders ED Discharge Orders     None

## 2022-10-08 NOTE — ED Provider Notes (Incomplete)
[facility] AT [facility] Provider Note   CSN: [id number] Arrival date & time: 10/08/22  2133     History {Add pertinent medical, surgical, social history, OB history to HPI:1} Suicide attempt, overdose  Kara Lamb is a 67 y.o. female.  HPI   Patient has a history of hypertension dyslipidemia, severe depression, coronary artery disease, bipolar disorder, borderline disorder, history of suicide attempts, tobacco use disorder who presents to the ED after an overdose.  Patient states she has been estranged from her daughters and grandchildren.  Her grandson passed away several months ago from a medical illness.  He would be 7 today.  Patient was not allowed to see him.  Patient has been very depressed and overdosed on pills.  Patient states she took Valium and trazodone 5 days ago.  Patient also took 10 trazodone and a couple of Valium today around 7 PM.  Home Medications Prior to Admission medications   Medication Sig Start Date End Date Taking? Authorizing Provider  albuterol (VENTOLIN HFA) 108 (90 Base) MCG/ACT inhaler Inhale 2 puffs into the lungs every 4 (four) hours as needed. 01/18/21   [provider]  aspirin 81 MG chewable tablet Chew 81 mg by mouth daily. 06/06/13   [provider]  atorvastatin (LIPITOR) 40 MG tablet Take 1 tablet (40 mg total) by mouth daily at 6 PM. NEEDS APPOINTMENT FOR FUTURE REFILLS Patient taking differently: Take 40 mg by mouth daily after supper. NEEDS APPOINTMENT FOR FUTURE REFILLS 08/18/15   Rollene Rotunda, MD  clopidogrel (PLAVIX) 75 MG tablet Take 1 tablet (75 mg total) by mouth daily. NEEDS APPOINTMENT FOR FUTURE REFILLS OR 90-DAY SUPPLY Patient taking differently: Take 75 mg by mouth daily after supper. NEEDS APPOINTMENT FOR FUTURE REFILLS OR 90-DAY SUPPLY 08/18/15   Rollene Rotunda, MD  diazepam (VALIUM) 5 MG tablet Take 5-10 mg by mouth daily as needed for anxiety (anxiety). 11/09/19   [provider]  famotidine (PEPCID) 40 MG tablet Take 40 mg by mouth daily. 360    [provider]  furosemide (LASIX) 20 MG tablet Take 20 mg by mouth daily. 05/25/20   [provider]  JARDIANCE 10 MG TABS tablet Take 10 mg by mouth daily. 11/17/21   [provider]  lamoTRIgine (LAMICTAL) 200 MG tablet Take 200 mg by mouth daily after breakfast. 11/28/20   [provider]  levothyroxine (SYNTHROID) 88 MCG tablet Take 88 mcg by mouth daily before breakfast. 10/14/20   [provider]  loperamide (IMODIUM A-D) 2 MG tablet Take 2 mg by mouth 4 (four) times daily as needed for diarrhea or loose stools.    [provider]  methocarbamol (ROBAXIN) 500 MG tablet Take 1 tablet (500 mg total) by mouth every 6 (six) hours as needed for muscle spasms. 05/21/20   Lanney Gins, PA-C  metoprolol tartrate (LOPRESSOR) 25 MG tablet Take 1 tablet (25 mg total) by mouth 2 (two) times daily. NEEDS APPOINTMENT FOR FUTURE REFILLS OR 90-DAY SUPPLY Patient taking differently: Take 25 mg by mouth daily after breakfast. 08/18/15   Rollene Rotunda, MD  Multiple Vitamin (MULTIVITAMIN WITH MINERALS) TABS tablet Take 1 tablet by mouth daily after breakfast. Seniors    [provider]  nicotine (NICODERM CQ - DOSED IN MG/24 HOURS) 21 mg/24hr patch Place 14 mg onto the skin every morning.    [provider]  nitroGLYCERIN (NITROSTAT) 0.4 MG SL tablet Place 1 tablet (0.4 mg total) under the tongue every  5 (five) minutes as needed for chest pain. Patient taking differently: Place 0.4 mg under the tongue every 5 (five) minutes x 3 doses as needed for chest pain. 06/21/13   Rollene Rotunda, MD  olmesartan (BENICAR) 5 MG tablet Take 5 mg by mouth daily after breakfast.    [provider]  ondansetron (ZOFRAN) 4 MG tablet Take 1 tablet (4 mg total) by mouth every 8 (eight) hours as needed for nausea or vomiting. 02/20/22   Yolonda Kida, MD   oxyCODONE-acetaminophen (PERCOCET) 7.5-325 MG tablet Take 1 tablet by mouth every 4 (four) hours as needed for severe pain. 02/20/22 02/20/23  Yolonda Kida, MD  pantoprazole (PROTONIX) 40 MG tablet Take 40 mg by mouth daily. One hour before coffee 10/14/20   [provider]  prazosin (MINIPRESS) 1 MG capsule Take 2 mg by mouth at bedtime. One hour before bedtime 01/21/19   [provider]  ranolazine (RANEXA) 500 MG 12 hr tablet Take 500 mg by mouth in the morning and at bedtime.  11/24/19   [provider]  topiramate (TOPAMAX) 50 MG tablet Take 50 mg by mouth at bedtime.  11/02/19   [provider]  traZODone (DESYREL) 100 MG tablet Take 100 mg by mouth at bedtime. On hour before bedtime    [provider]  venlafaxine XR (EFFEXOR-XR) 150 MG 24 hr capsule Take 150 mg by mouth daily after breakfast. Take with 75 mg for a total of 225 mg after breakfast 11/02/19   [provider]  venlafaxine XR (EFFEXOR-XR) 75 MG 24 hr capsule Take 75 mg by mouth daily after breakfast. Take with 150 mg for  a total of 225 mg after breakfast    [provider]      Allergies    Heparin, Isosorbide nitrate, Nitroglycerin, Paroxetine, Paroxetine hcl, Perphenazine, Sertraline, Zoloft [sertraline hcl], and Zolpidem    Review of Systems   Review of Systems  Physical Exam Updated Vital Signs BP (!) 170/90 (BP Location: Right Arm)   Pulse 79   Temp 98.1 F (36.7 C) (Oral)   Resp 18   Ht 1.524 m (5')   Wt 97.5 kg   SpO2 94%   BMI 41.99 kg/m  Physical Exam Vitals and nursing note reviewed.  Constitutional:      Appearance: She is well-developed. She is not diaphoretic.  HENT:     Head: Normocephalic and atraumatic.     Right Ear: External ear normal.     Left Ear: External ear normal.  Eyes:     General: No scleral icterus.       Right eye: No discharge.        Left eye: No discharge.     Conjunctiva/sclera: Conjunctivae normal.  Neck:      Trachea: No tracheal deviation.  Cardiovascular:     Rate and Rhythm: Normal rate and regular rhythm.  Pulmonary:     Effort: Pulmonary effort is normal. No respiratory distress.     Breath sounds: Normal breath sounds. No stridor. No wheezing or rales.  Abdominal:     General: Bowel sounds are normal. There is no distension.     Palpations: Abdomen is soft.     Tenderness: There is no abdominal tenderness. There is no guarding or rebound.  Musculoskeletal:        General: No tenderness or deformity.     Cervical back: Neck supple.  Skin:    General: Skin is warm and dry.  Findings: No rash.  Neurological:     General: No focal deficit present.     Mental Status: She is alert.     Cranial Nerves: No cranial nerve deficit, dysarthria or facial asymmetry.     Sensory: No sensory deficit.     Motor: No abnormal muscle tone or seizure activity.     Coordination: Coordination normal.  Psychiatric:        Mood and Affect: Mood is depressed.        Thought Content: Thought content includes suicidal ideation.     ED Results / Procedures / Treatments   Labs (all labs ordered are listed, but only abnormal results are displayed) Labs Reviewed - No data to display  EKG None  Radiology No results found.  Procedures Procedures  {Document cardiac monitor, telemetry assessment procedure when appropriate:1}  Medications Ordered in ED Medications - No data to display  ED Course/ Medical Decision Making/ A&P   {   Click here for ABCD2, HEART and other calculatorsREFRESH Note before signing :1}                          Medical Decision Making Amount and/or Complexity of Data Reviewed Labs: ordered.  Risk OTC drugs.   ***  {Document critical care time when appropriate:1} {Document review of labs and clinical decision tools ie heart score, Chads2Vasc2 etc:1}  {Document your independent review of radiology images, and any outside records:1} {Document your discussion with  family members, caretakers, and with consultants:1} {Document social determinants of health affecting pt's care:1} {Document your decision making why or why not admission, treatments were needed:1} Final Clinical Impression(s) / ED Diagnoses Final diagnoses:  None    Rx / DC Orders ED Discharge Orders     None

## 2022-10-09 LAB — RAPID URINE DRUG SCREEN, HOSP PERFORMED
Amphetamines: NOT DETECTED
Barbiturates: NOT DETECTED
Benzodiazepines: POSITIVE — AB
Cocaine: NOT DETECTED
Opiates: NOT DETECTED
Tetrahydrocannabinol: POSITIVE — AB

## 2022-10-09 LAB — SARS CORONAVIRUS 2 BY RT PCR: SARS Coronavirus 2 by RT PCR: NEGATIVE

## 2022-10-09 MED ORDER — ASPIRIN 81 MG PO CHEW
81.0000 mg | CHEWABLE_TABLET | Freq: Every day | ORAL | Status: DC
  Administered 2022-10-09 – 2022-10-11 (×3): 81 mg via ORAL
  Filled 2022-10-09 (×3): qty 1

## 2022-10-09 MED ORDER — ALUM & MAG HYDROXIDE-SIMETH 200-200-20 MG/5ML PO SUSP
30.0000 mL | Freq: Once | ORAL | Status: AC
  Administered 2022-10-09: 30 mL via ORAL
  Filled 2022-10-09: qty 30

## 2022-10-09 MED ORDER — FUROSEMIDE 20 MG PO TABS
20.0000 mg | ORAL_TABLET | Freq: Every day | ORAL | Status: DC
  Administered 2022-10-09 – 2022-10-11 (×3): 20 mg via ORAL
  Filled 2022-10-09 (×3): qty 1

## 2022-10-09 MED ORDER — VENLAFAXINE HCL ER 75 MG PO CP24
150.0000 mg | ORAL_CAPSULE | Freq: Every day | ORAL | Status: DC
  Administered 2022-10-10 – 2022-10-11 (×2): 150 mg via ORAL
  Filled 2022-10-09 (×2): qty 2

## 2022-10-09 MED ORDER — FAMOTIDINE 20 MG PO TABS
20.0000 mg | ORAL_TABLET | Freq: Once | ORAL | Status: AC
  Administered 2022-10-09: 20 mg via ORAL
  Filled 2022-10-09: qty 1

## 2022-10-09 MED ORDER — METOPROLOL SUCCINATE ER 50 MG PO TB24
25.0000 mg | ORAL_TABLET | Freq: Every day | ORAL | Status: DC
  Administered 2022-10-09 – 2022-10-11 (×3): 25 mg via ORAL
  Filled 2022-10-09 (×3): qty 1

## 2022-10-09 MED ORDER — ALUM & MAG HYDROXIDE-SIMETH 200-200-20 MG/5ML PO SUSP
30.0000 mL | Freq: Three times a day (TID) | ORAL | Status: DC | PRN
  Administered 2022-10-09: 30 mL via ORAL
  Filled 2022-10-09: qty 30

## 2022-10-09 MED ORDER — CLOPIDOGREL BISULFATE 75 MG PO TABS
75.0000 mg | ORAL_TABLET | Freq: Every day | ORAL | Status: DC
  Administered 2022-10-09 – 2022-10-11 (×3): 75 mg via ORAL
  Filled 2022-10-09 (×3): qty 1

## 2022-10-09 MED ORDER — LEVOTHYROXINE SODIUM 88 MCG PO TABS
88.0000 ug | ORAL_TABLET | Freq: Every day | ORAL | Status: DC
  Administered 2022-10-10 – 2022-10-11 (×2): 88 ug via ORAL
  Filled 2022-10-09 (×2): qty 1

## 2022-10-09 MED ORDER — ALBUTEROL SULFATE (2.5 MG/3ML) 0.083% IN NEBU: 2.5000 mg | INHALATION_SOLUTION | RESPIRATORY_TRACT | Status: DC | PRN

## 2022-10-09 MED ORDER — EMPAGLIFLOZIN 10 MG PO TABS
10.0000 mg | ORAL_TABLET | Freq: Every day | ORAL | Status: DC
  Administered 2022-10-09 – 2022-10-11 (×3): 10 mg via ORAL
  Filled 2022-10-09 (×3): qty 1

## 2022-10-09 MED ORDER — ATORVASTATIN CALCIUM 40 MG PO TABS
40.0000 mg | ORAL_TABLET | Freq: Every day | ORAL | Status: DC
  Administered 2022-10-09 – 2022-10-11 (×3): 40 mg via ORAL
  Filled 2022-10-09 (×3): qty 1

## 2022-10-09 MED ORDER — RANOLAZINE ER 500 MG PO TB12
500.0000 mg | ORAL_TABLET | Freq: Two times a day (BID) | ORAL | Status: DC
  Administered 2022-10-09 – 2022-10-11 (×5): 500 mg via ORAL
  Filled 2022-10-09 (×6): qty 1

## 2022-10-09 MED ORDER — ALBUTEROL SULFATE HFA 108 (90 BASE) MCG/ACT IN AERS: 2.0000 | INHALATION_SPRAY | RESPIRATORY_TRACT | Status: DC | PRN

## 2022-10-09 MED ORDER — SPIRONOLACTONE 12.5 MG HALF TABLET
12.5000 mg | ORAL_TABLET | Freq: Every day | ORAL | Status: DC
  Administered 2022-10-09 – 2022-10-11 (×3): 12.5 mg via ORAL
  Filled 2022-10-09 (×3): qty 1

## 2022-10-09 MED ORDER — IRBESARTAN 75 MG PO TABS
37.5000 mg | ORAL_TABLET | Freq: Every day | ORAL | Status: DC
  Administered 2022-10-09 – 2022-10-11 (×3): 37.5 mg via ORAL
  Filled 2022-10-09 (×3): qty 0.5

## 2022-10-09 MED ORDER — PANTOPRAZOLE SODIUM 40 MG PO TBEC
40.0000 mg | DELAYED_RELEASE_TABLET | Freq: Every day | ORAL | Status: DC
  Administered 2022-10-10 – 2022-10-11 (×2): 40 mg via ORAL
  Filled 2022-10-09 (×3): qty 1

## 2022-10-09 MED ORDER — LEVOTHYROXINE SODIUM 88 MCG PO TABS: 88.0000 ug | ORAL_TABLET | Freq: Every day | ORAL | Status: DC

## 2022-10-09 NOTE — ED Notes (Signed)
Patient has been alert this shift. Patient cooperative. Anxious at times. Patient medication complaint. Patient denied suicidal ideation at this time. Patient denied homicidal ideation at this time. Patient unsteady at times.

## 2022-10-09 NOTE — ED Notes (Signed)
Pt was changed into purple scrubs and belongings were placed in the nurses station cabinet on the 16-18 side.

## 2022-10-09 NOTE — ED Notes (Signed)
Pt being TTS'd at this time 

## 2022-10-09 NOTE — ED Notes (Signed)
Pt SPO2 staying in low 90's. Placed on 2L [place]

## 2022-10-09 NOTE — Consult Note (Signed)
[facility] ED ASSESSMENT   Reason for Consult:  Psychiatry evaluation Referring Physician:  ER Physician Patient Identification: Kara Lamb MRN:  [id number] ED Chief Complaint: Suicide attempt [facility])  Diagnosis:  Principal Problem:   Suicide attempt [facility]) Active Problems:   Severe major depression without psychotic features [facility])   ED Assessment Time Calculation: Start Time: 1328 Stop Time: 1356 Total Time in Minutes (Assessment Completion): 28   Subjective:   Kara Lamb is a 67 y.o. female patient admitted with hx of Depression, Bipolar disorder, anxiety disorder, Borderline Personality disorder and suicide attempts brought in to the ER after OD ON 250 mg  Valium, 20 Tablet Trazodone  of 100 mg each for suicide.attempt. Patient reports stressors includes family discord-her not being able to see grandchildren and not able to have a relationship with her three adult daughters.  HPI:  Patient was seen still feeling tired and drowsy.  She admitted taking 120 tablets of Zofran and not Valium and 15-20 tablets of 100 mg Trazodone.  She admitted that intention was to die.  She admitted 6-7 previous suicide attempts and last was eight years ago.  Patient reports diagnosis of Depression but states she does not agree that she suffers from Bipolar disorder.  Patient reports that her twin sister suffers from Bipolar disorder and is always Manic.  She states her behavior is not as bad as her twin sister and as such the bipolar disorder is not for her.  Patient became tearful stating that she does not know why her daughters do not want her near her grandchildren.  She repeatedly mentioned the death of a grandson whom she only was allowed to see when he was about to pass on.  Patient reports not been able to grieve her loss.  She rates depression 9/10 with 10 being severe depression.  She sees Kara Lamb , NP at [facility] and reports that she was just weaned off Lamictal last week.  She also has an  appointment scheduled with her Psych Provider for May 2nd.  Patient reports poor sleep even with her Trazodone.  She denies feeling suicidal today but added" I want all my pain to go away"  Patient would not explain pain going away.  She agrees to engage in counseling after inpatient Hospitalization.  Patient denies HI/AVH and no mention of paranoia. Patient meets criteria for inpatient [facility] Psychiatry hospitalization for safety and stabilization.  We will fax out records to facilities with available bed.  Patient at this agrees to Voluntary hospitalization stating she need help with her depression and overall Mental healthcare.  Last recorded outpatient visit with Psychiatrist was 2022.  Patient does not know which Medications she is currently supposed to take at this time.  She declined request to speak with any of her daughters.  Past Psychiatric History: hx of Depression, Bipolar disorder, anxiety disorder, Borderline Personality disorder and suicide attempts  Risk to Self or Others: Is the patient at risk to self? Yes Has the patient been a risk to self in the past 6 months? No Has the patient been a risk to self within the distant past? Yes Is the patient a risk to others? No Has the patient been a risk to others in the past 6 months? No Has the patient been a risk to others within the distant past? No  [place] Scale:  Flowsheet Row ED from 10/08/2022 in [facility] Emergency Department at [facility] Admission (Discharged) from 02/19/2022 in [facility]  Pre-Admission Testing 60 from 02/16/2022 in [facility]  C-SSRS RISK CATEGORY High Risk No Risk Error: Question 6 not populated       AIMS:  , , ,  ,   ASAM: ASAM Multidimensional Assessment Summary Dimension 1:  Description of individual's past and current experiences of substance use and withdrawal: n/a Dimension 2:  Description of patient's biomedical conditions and   complications: n/a Dimension 3:  Description of emotional, behavioral, or cognitive conditions and complications: n/a Dimension 4:  Description of Readiness to Change criteria: n/a Dimension 5:  Relapse, continued use, or continued problem potential critiera description: n/a Dimension 6:  Recovery/Iiving environment criteria description: n/a ASAM Recommended Level of Treatment:  (n/a)  Substance Abuse:  Alcohol / Drug Use Pain Medications: pt denies abuse - see PTA meds list Prescriptions: see PTA meds list - pt denies abuse Over the Counter: see PTA meds list - pt denies abuse History of alcohol / drug use?: No history of alcohol / drug abuse Longest period of sobriety (when/how long): n/a Negative Consequences of Use:  (n/a) Withdrawal Symptoms:  (n/a)  Past Medical History:  Past Medical History:  Diagnosis Date   Anginal pain (HCC)    Anxiety and depression    Arthritis    Asthma    CAD (coronary artery disease)    a. s/p BMS to RCA and LCX 2008; b. ISR of RCA rx'd with Xience DES 12/09; c. Requires extensive sedation for cath; d. cath 11/10: nonobs; e.Cath 2/12: nonobs; f. 05/2011 Cath: nonobs; g. 02/2013 nonobs; h. 07/2013 lat isch on nuc-->cath: nonobstructive, EF 60-65%.   Chronic chest pain    Colitis    IBS   Complication of anesthesia    Diabetes mellitus without complication (HCC)    FUO (fever of unknown origin) 11/15/2019   GERD (gastroesophageal reflux disease)    H/O ETOH abuse    H/O: suicide attempt    a. multiple   Hyperlipidemia    Hypertension    Hypothyroidism    IBS (irritable bowel syndrome)    Myocardial infarction (HCC) 2008   Obesity    Pneumonia    Tobacco abuse     Past Surgical History:  Procedure Laterality Date   ARTHROSCOPY KNEE W/ DRILLING Left    CARDIAC CATHETERIZATION  2008   stents   CARPAL TUNNEL RELEASE     CATARACT EXTRACTION W/ INTRAOCULAR LENS IMPLANT Bilateral 11/2020   CESAREAN SECTION     x 3   COLON BIOPSY  08/12/2008    LEFT HEART CATHETERIZATION WITH CORONARY ANGIOGRAM  06/04/2011   Procedure: LEFT HEART CATHETERIZATION WITH CORONARY ANGIOGRAM;  Surgeon: Kathleene Hazel, MD;  Location: [facility] CATH LAB;  Service: Cardiovascular;;   LEFT HEART CATHETERIZATION WITH CORONARY ANGIOGRAM N/A 02/22/2013   Procedure: LEFT HEART CATHETERIZATION WITH CORONARY ANGIOGRAM;  Surgeon: Alvia Grove, MD;  Location: [facility] CATH LAB;  Service: Cardiovascular;  Laterality: N/A;   LEFT HEART CATHETERIZATION WITH CORONARY ANGIOGRAM N/A 07/25/2013   Procedure: LEFT HEART CATHETERIZATION WITH CORONARY ANGIOGRAM;  Surgeon: Lesleigh Noe, MD;  Location: [facility] CATH LAB;  Service: Cardiovascular;  Laterality: N/A;   PTCA     stent   removal ganglion Left    removal of breast tumor Right 1973   REVERSE SHOULDER ARTHROPLASTY Right 02/19/2022   Procedure: REVERSE SHOULDER ARTHROPLASTY;  Surgeon: Yolonda Kida, MD;  Location: [facility] ORS;  Service: Orthopedics;  Laterality: Right;   ROTATOR CUFF REPAIR Right 2013  TOTAL HIP ARTHROPLASTY Left 05/20/2020   Procedure: TOTAL HIP ARTHROPLASTY ANTERIOR APPROACH;  Surgeon: Durene Romans, MD;  Location: [facility] ORS;  Service: Orthopedics;  Laterality: Left;  70 mins   TUBAL LIGATION  1982   Family History:  Family History  Problem Relation Age of Onset   Coronary artery disease Mother    Emphysema Mother    Colon cancer Father    Prostate cancer Father    Family Psychiatric  History: Father-Depression Paternal Grandmother-Bipolar disorder, Twin sister -Bipolar disorder-Manic,  Social History:  Social History   Substance and Sexual Activity  Alcohol Use Yes   Alcohol/week: 0.0 standard drinks of alcohol   Comment: rarely     Social History   Substance and Sexual Activity  Drug Use No    Social History   Socioeconomic History   Marital status: Divorced    Spouse name: Not on file   Number of children: 3   Years of education: Not on file   Highest education level: Not on  file  Occupational History    Comment: Workmen's comp  Tobacco Use   Smoking status: Former    Packs/day: 0.25    Years: 43.00    Additional pack years: 0.00    Total pack years: 10.75    Types: Cigarettes    Quit date: 11/07/2019    Years since quitting: 2.9   Smokeless tobacco: Never  Vaping Use   Vaping Use: Never used  Substance and Sexual Activity   Alcohol use: Yes    Alcohol/week: 0.0 standard drinks of alcohol    Comment: rarely   Drug use: No   Sexual activity: Not Currently  Other Topics Concern   Not on file  Social History Narrative   Not on file   Social Determinants of Health   Financial Resource Strain: Not on file  Food Insecurity: No Food Insecurity (02/19/2022)   Hunger Vital Sign    Worried About Running Out of Food in the Last Year: Never true    Ran Out of Food in the Last Year: Never true  Transportation Needs: No Transportation Needs (02/19/2022)   PRAPARE - Administrator, Civil Service (Medical): No    Lack of Transportation (Non-Medical): No  Physical Activity: Not on file  Stress: Not on file  Social Connections: Not on file   Additional Social History:    Allergies:   Allergies  Allergen Reactions   Heparin Other (See Comments)    Injections in the stomach causes huge knots, tenderness, and pain for weeks (hematomas)  Other Reaction(s): Not available   Isosorbide Nitrate Other (See Comments)    Low blood pressure   Nitroglycerin Other (See Comments)    Patches--Migraine  Drip--nausea and vomiting  Other Reaction(s): Not available   Paroxetine    Paroxetine Hcl Diarrhea    Severe   Perphenazine Other (See Comments)    Cannot recall    Sertraline     Other Reaction(s): Diarrhea, GI Intolerance   Zoloft [Sertraline Hcl] Other (See Comments)    Severe diarrhea   Zolpidem Other (See Comments)    Sleep walking and loss of memory     Labs:  Results for orders placed or performed during the hospital encounter of  10/08/22 (from the past 48 hour(s))  Urine rapid drug screen (hosp performed)     Status: Abnormal   Collection Time: 10/08/22  5:00 AM  Result Value Ref Range   Opiates NONE DETECTED NONE DETECTED  Cocaine NONE DETECTED NONE DETECTED   Benzodiazepines POSITIVE (A) NONE DETECTED   Amphetamines NONE DETECTED NONE DETECTED   Tetrahydrocannabinol POSITIVE (A) NONE DETECTED   Barbiturates NONE DETECTED NONE DETECTED    Comment: (NOTE) DRUG SCREEN FOR MEDICAL PURPOSES ONLY.  IF CONFIRMATION IS NEEDED FOR ANY PURPOSE, NOTIFY LAB WITHIN 5 DAYS.  LOWEST DETECTABLE LIMITS FOR URINE DRUG SCREEN Drug Class                     Cutoff (ng/mL) Amphetamine and metabolites    1000 Barbiturate and metabolites    200 Benzodiazepine                 200 Opiates and metabolites        300 Cocaine and metabolites        300 THC                            50 Performed at [facility], [address] [place], [place] [address]   Comprehensive metabolic panel     Status: Abnormal   Collection Time: 10/08/22 11:00 PM  Result Value Ref Range   Sodium 137 135 - 145 mmol/L   Potassium 4.7 3.5 - 5.1 mmol/L    Comment: HEMOLYSIS AT THIS LEVEL MAY AFFECT RESULT   Chloride 100 98 - 111 mmol/L   CO2 24 22 - 32 mmol/L   Glucose, Bld 173 (H) 70 - 99 mg/dL    Comment: Glucose reference range applies only to samples taken after fasting for at least 8 hours.   BUN 16 8 - 23 mg/dL   Creatinine, Ser 6.04 0.44 - 1.00 mg/dL   Calcium 9.9 8.9 - 54.0 mg/dL   Total Protein 7.5 6.5 - 8.1 g/dL   Albumin 3.9 3.5 - 5.0 g/dL   AST 62 (H) 15 - 41 U/L    Comment: HEMOLYSIS AT THIS LEVEL MAY AFFECT RESULT   ALT 41 0 - 44 U/L    Comment: HEMOLYSIS AT THIS LEVEL MAY AFFECT RESULT   Alkaline Phosphatase 78 38 - 126 U/L   Total Bilirubin 1.4 (H) 0.3 - 1.2 mg/dL    Comment: HEMOLYSIS AT THIS LEVEL MAY AFFECT RESULT   GFR, Estimated >60 >60 mL/min    Comment: (NOTE) Calculated using the CKD-EPI  Creatinine Equation (2021)    Anion gap 13 5 - 15    Comment: Performed at [facility], [address] [place], [place] [address]  Ethanol     Status: None   Collection Time: 10/08/22 11:00 PM  Result Value Ref Range   Alcohol, Ethyl (B) <10 <10 mg/dL    Comment: (NOTE) Lowest detectable limit for serum alcohol is 10 mg/dL.  For medical purposes only. Performed at [facility], [address] [place], [place] [address]   CBC with Diff     Status: Abnormal   Collection Time: 10/08/22 11:00 PM  Result Value Ref Range   WBC 11.2 (H) 4.0 - 10.5 K/uL   RBC 4.32 3.87 - 5.11 MIL/uL   Hemoglobin 15.6 (H) 12.0 - 15.0 g/dL   HCT 95.6 (H) 21.3 - 08.6 %   MCV 108.8 (H) 80.0 - 100.0 fL   MCH 36.1 (H) 26.0 - 34.0 pg   MCHC 33.2 30.0 - 36.0 g/dL   RDW 57.8 46.9 - 62.9 %   Platelets 206 150 - 400 K/uL   nRBC  0.0 0.0 - 0.2 %   Neutrophils Relative % 63 %   Neutro Abs 6.9 1.7 - 7.7 K/uL   Lymphocytes Relative 26 %   Lymphs Abs 2.9 0.7 - 4.0 K/uL   Monocytes Relative 8 %   Monocytes Absolute 0.9 0.1 - 1.0 K/uL   Eosinophils Relative 1 %   Eosinophils Absolute 0.2 0.0 - 0.5 K/uL   Basophils Relative 1 %   Basophils Absolute 0.2 (H) 0.0 - 0.1 K/uL   Immature Granulocytes 1 %   Abs Immature Granulocytes 0.14 (H) 0.00 - 0.07 K/uL    Comment: Performed at [facility], [address], [place], [place] [address]  Acetaminophen level     Status: Abnormal   Collection Time: 10/08/22 11:00 PM  Result Value Ref Range   Acetaminophen (Tylenol), Serum <10 (L) 10 - 30 ug/mL    Comment: (NOTE) Therapeutic concentrations vary significantly. A range of 10-30 ug/mL  may be an effective concentration for many patients. However, some  are best treated at concentrations outside of this range. Acetaminophen concentrations >150 ug/mL at 4 hours after ingestion  and >50 ug/mL at 12 hours after ingestion are often associated with  toxic  reactions.  Performed at [facility], [address], [place], [place] [address]   Salicylate level     Status: Abnormal   Collection Time: 10/08/22 11:00 PM  Result Value Ref Range   Salicylate Lvl <7.0 (L) 7.0 - 30.0 mg/dL    Comment: Performed at [facility], [address] [place], [place] [address]  SARS Coronavirus 2 by RT PCR (hospital order, performed in [facility] hospital lab) *cepheid single result test* Anterior Nasal Swab     Status: None   Collection Time: 10/09/22  7:36 AM   Specimen: Anterior Nasal Swab  Result Value Ref Range   SARS Coronavirus 2 by RT PCR NEGATIVE NEGATIVE    Comment: (NOTE) SARS-CoV-2 target nucleic acids are NOT DETECTED.  The SARS-CoV-2 RNA is generally detectable in upper and lower respiratory specimens during the acute phase of infection. The lowest concentration of SARS-CoV-2 viral copies this assay can detect is 250 copies / mL. A negative result does not preclude SARS-CoV-2 infection and should not be used as the sole basis for treatment or other patient management decisions.  A negative result may occur with improper specimen collection / handling, submission of specimen other than nasopharyngeal swab, presence of viral mutation(s) within the areas targeted by this assay, and inadequate number of viral copies (<250 copies / mL). A negative result must be combined with clinical observations, patient history, and epidemiological information.  Fact Sheet for Patients:   RoadLapTop.co.za  Fact Sheet for Healthcare Providers: http://kim-miller.com/  This test is not yet approved or  cleared by the [place] FDA and has been authorized for detection and/or diagnosis of SARS-CoV-2 by FDA under an Emergency Use Authorization (EUA).  This EUA will remain in effect (meaning this test can be used) for the duration of the COVID-19 declaration under Section  564(b)(1) of the Act, 21 U.S.C. section 360bbb-3(b)(1), unless the authorization is terminated or revoked sooner.  Performed at [facility], [address], [place], [place] [address]     No current facility-administered medications for this encounter.   Current Outpatient Medications  Medication Sig Dispense Refill   albuterol (VENTOLIN HFA) 108 (90 Base) MCG/ACT inhaler Inhale 2 puffs into the lungs every 4 (four) hours as needed.  aspirin 81 MG chewable tablet Chew 81 mg by mouth daily.     atorvastatin (LIPITOR) 40 MG tablet Take 1 tablet (40 mg total) by mouth daily at 6 PM. NEEDS APPOINTMENT FOR FUTURE REFILLS (Patient taking differently: Take 40 mg by mouth daily after supper. NEEDS APPOINTMENT FOR FUTURE REFILLS) 30 tablet 0   clopidogrel (PLAVIX) 75 MG tablet Take 1 tablet (75 mg total) by mouth daily. NEEDS APPOINTMENT FOR FUTURE REFILLS OR 90-DAY SUPPLY (Patient taking differently: Take 75 mg by mouth daily after supper. NEEDS APPOINTMENT FOR FUTURE REFILLS OR 90-DAY SUPPLY) 30 tablet 0   diazepam (VALIUM) 5 MG tablet Take 5-10 mg by mouth daily as needed for anxiety (anxiety).     famotidine (PEPCID) 40 MG tablet Take 40 mg by mouth daily. 360     furosemide (LASIX) 20 MG tablet Take 20 mg by mouth daily.     JARDIANCE 10 MG TABS tablet Take 10 mg by mouth daily.     lamoTRIgine (LAMICTAL) 200 MG tablet Take 200 mg by mouth daily after breakfast.     levothyroxine (SYNTHROID) 88 MCG tablet Take 88 mcg by mouth daily before breakfast.     loperamide (IMODIUM A-D) 2 MG tablet Take 2 mg by mouth 4 (four) times daily as needed for diarrhea or loose stools.     methocarbamol (ROBAXIN) 500 MG tablet Take 1 tablet (500 mg total) by mouth every 6 (six) hours as needed for muscle spasms. 40 tablet 0   metoprolol tartrate (LOPRESSOR) 25 MG tablet Take 1 tablet (25 mg total) by mouth 2 (two) times daily. NEEDS APPOINTMENT FOR FUTURE REFILLS OR 90-DAY SUPPLY (Patient  taking differently: Take 25 mg by mouth daily after breakfast.) 60 tablet 0   Multiple Vitamin (MULTIVITAMIN WITH MINERALS) TABS tablet Take 1 tablet by mouth daily after breakfast. Seniors     nicotine (NICODERM CQ - DOSED IN MG/24 HOURS) 21 mg/24hr patch Place 14 mg onto the skin every morning.     nitroGLYCERIN (NITROSTAT) 0.4 MG SL tablet Place 1 tablet (0.4 mg total) under the tongue every 5 (five) minutes as needed for chest pain. (Patient taking differently: Place 0.4 mg under the tongue every 5 (five) minutes x 3 doses as needed for chest pain.) 25 tablet 3   olmesartan (BENICAR) 5 MG tablet Take 5 mg by mouth daily after breakfast.     ondansetron (ZOFRAN) 4 MG tablet Take 1 tablet (4 mg total) by mouth every 8 (eight) hours as needed for nausea or vomiting. 20 tablet 0   oxyCODONE-acetaminophen (PERCOCET) 7.5-325 MG tablet Take 1 tablet by mouth every 4 (four) hours as needed for severe pain. 25 tablet 0   pantoprazole (PROTONIX) 40 MG tablet Take 40 mg by mouth daily. One hour before coffee     prazosin (MINIPRESS) 1 MG capsule Take 2 mg by mouth at bedtime. One hour before bedtime     ranolazine (RANEXA) 500 MG 12 hr tablet Take 500 mg by mouth in the morning and at bedtime.      topiramate (TOPAMAX) 50 MG tablet Take 50 mg by mouth at bedtime.      traZODone (DESYREL) 100 MG tablet Take 100 mg by mouth at bedtime. On hour before bedtime     venlafaxine XR (EFFEXOR-XR) 150 MG 24 hr capsule Take 150 mg by mouth daily after breakfast. Take with 75 mg for a total of 225 mg after breakfast     venlafaxine XR (EFFEXOR-XR) 75 MG 24 hr  capsule Take 75 mg by mouth daily after breakfast. Take with 150 mg for  a total of 225 mg after breakfast      Musculoskeletal: Strength & Muscle Tone:  Seen in bed lying down Gait & Station:  seen in bed lying down Patient leans:  see above.   Psychiatric Specialty Exam: Presentation  General Appearance:  Casual; Fairly Groomed  Eye  Contact: Good  Speech: Clear and Coherent; Normal Rate  Speech Volume: Normal  Handedness: Right   Mood and Affect  Mood: Anxious; Depressed; Angry  Affect: Congruent; Depressed   Thought Process  Thought Processes: Coherent; Goal Directed; Linear  Descriptions of Associations:Intact  Orientation:Full (Time, Place and Person)  Thought Content:Logical  History of Schizophrenia/Schizoaffective disorder:No  Duration of Psychotic Symptoms:No data recorded Hallucinations:Hallucinations: None  Ideas of Reference:None  Suicidal Thoughts:Suicidal Thoughts: No  Homicidal Thoughts:Homicidal Thoughts: No   Sensorium  Memory: Immediate Good; Recent Good; Remote Good  Judgment: Poor  Insight: Fair   Chartered certified accountant: Fair  Attention Span: Fair  Recall: [employer] of Knowledge: Fair  Language: Fair   Psychomotor Activity  Psychomotor Activity: Psychomotor Activity: Normal   Assets  Assets: Manufacturing systems engineer; Desire for Improvement; Housing    Sleep  Sleep: Sleep: Good   Physical Exam: Physical Exam Vitals and nursing note reviewed.  Constitutional:      Appearance: Normal appearance.  HENT:     Head: Normocephalic and atraumatic.     Nose: Nose normal.  Cardiovascular:     Rate and Rhythm: Normal rate and regular rhythm.  Pulmonary:     Effort: Pulmonary effort is normal.  Musculoskeletal:     Cervical back: Normal range of motion.  Skin:    General: Skin is warm and dry.  Neurological:     Mental Status: She is alert and oriented to person, place, and time.  Psychiatric:        Attention and Perception: Attention and perception normal.        Mood and Affect: Mood is depressed. Affect is angry and tearful.        Speech: Speech normal.        Behavior: Behavior is cooperative.        Thought Content: Thought content includes suicidal ideation.        Cognition and Memory: Cognition and memory  normal.        Judgment: Judgment normal.    Review of Systems  Constitutional: Negative.   HENT: Negative.    Eyes: Negative.   Respiratory: Negative.    Cardiovascular: Negative.   Gastrointestinal: Negative.   Genitourinary: Negative.   Musculoskeletal: Negative.   Skin: Negative.   Neurological: Negative.   Endo/Heme/Allergies: Negative.   Psychiatric/Behavioral:  Positive for depression and suicidal ideas. The patient is nervous/anxious and has insomnia.    Blood pressure (!) 155/94, pulse 84, temperature 98.6 F (37 C), temperature source Oral, resp. rate 18, height 5' (1.524 m), weight 97.5 kg, SpO2 100 %. Body mass index is 41.99 kg/m.  Medical Decision Making: Patient meets criteria for inpatient Mental health hospitalization for safety and stabilization.  She also will need grief counseling in the outpatient setting after hospitalization.  We will seek bed placement at any facility with available bed.  Plan is to resume  home Medication  when patient is fully alert based on medications she took before coming to the hospital.  Problem 1: Suicide attempt  Problem 2: Recurrent Major Depressive disorder, severe without Psychotic  features.  Disposition:  Admit, seek bed placement.  Earney Navy, NP-PMHNP-BC 10/09/2022 2:52 PM

## 2022-10-09 NOTE — ED Provider Notes (Signed)
Emergency Medicine Observation Re-evaluation Note  Kara Lamb is a 67 y.o. female, seen on rounds today.  Pt initially presented to the ED for complaints of feeling depressed, suicidal thoughts. Currently alert, calm, nad.   Physical Exam  BP 128/72   Pulse 64   Temp 98 F (36.7 C) (Oral)   Resp (!) 23   Ht 1.524 m (5')   Wt 97.5 kg   SpO2 92%   BMI 41.99 kg/m  Physical Exam General: calm, nad.  Cardiac: regular rate.  Lungs: breathing comfortably. Psych: calm, not responding to internal stimuli.   ED Course / MDM    I have reviewed the labs performed to date as well as medications administered while in observation.  Recent changes in the last 24 hours include ED obs, reassessment, TTS/BH consult.   Plan  The patient has been placed in psychiatric observation due to the need to provide a safe environment for the patient while obtaining psychiatric consultation and evaluation, as well as ongoing medical and medication management to treat the patient's condition.    BH team is currently recommending inpatient BH tx and seeking placement.   Disposition per [facility] team.      Cathren Laine, MD 10/09/22 [phone]

## 2022-10-09 NOTE — Progress Notes (Signed)
CSW requested [facility] Antoinette Cillo, RN to review. Pt meets inpatient criteria per Earney Navy, NP.    Maryjean Ka, MSW, [facility] 10/09/2022 3:54 PM

## 2022-10-09 NOTE — ED Notes (Signed)
Patient to room 27. Patient alert. Patient able to ambulate. Patient oriented to unit and room.  Patient on 91 2LNC .  Patient calm and cooperative.
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
## 2022-10-10 DIAGNOSIS — T1491XA Suicide attempt, initial encounter: Secondary | ICD-10-CM

## 2022-10-10 MED ORDER — IBUPROFEN 200 MG PO TABS
600.0000 mg | ORAL_TABLET | Freq: Once | ORAL | Status: AC
  Administered 2022-10-10: 600 mg via ORAL
  Filled 2022-10-10: qty 3

## 2022-10-10 NOTE — ED Notes (Signed)
Patient has been alert this shift.  Patient medication compliant. Patient states, " regretful about she did (overdose)". Patient denied suicidal ideation. Patient denied homicidal ideation.  Patient able to do all ADLs.

## 2022-10-10 NOTE — ED Notes (Signed)
Patient not O2 .  Patient has been off O2 since 10/10/22 in the afternoon.

## 2022-10-10 NOTE — ED Notes (Signed)
Patient placed in blue scrubs due to no available burgundy scrubs in patient size

## 2022-10-10 NOTE — ED Notes (Signed)
Patient on room air with stable O2 readings.

## 2022-10-10 NOTE — Progress Notes (Signed)
[facility] Psych ED Progress Note  10/10/2022 4:11 PM Kara Lamb  MRN:  [id number]   Subjective:  Kara Lamb is a 67 y.o. female patient admitted with hx of Depression, Bipolar disorder, anxiety disorder, Borderline Personality disorder and suicide attempts brought in to the ER after OD ON 250 mg  Valium, 20 Tablet Trazodone  of 100 mg each for suicide.attempt. Patient reports stressors includes family discord-her not being able to see grandchildren and not able to have a relationship with her three adult daughters. Patient was seen today alert and oriented x4/.  She regrets taking the pills and believes she is not going to repeat that again.  Patient believes she need counseling to help her learn stress management.  Patient also needs better management of her Psychotropic medications. She reports multiple Medications she has been weaned off from but cannot give reasons why.  Patient denies SI/HI/AVH  however she continues to require inpatient Psychiatry hospitalization.  We will fax out records to facilities for bed placement.  Plan is for patient to engage in outpatient counseling after hospitalization.   Principal Problem: Suicide attempt [facility]) Diagnosis:  Principal Problem:   Suicide attempt [facility]) Active Problems:   Severe major depression without psychotic features [facility])   ED Assessment Time Calculation: Start Time: 1554 Stop Time: 1611 Total Time in Minutes (Assessment Completion): 17   Past Psychiatric History: see initial Psychiatric evaluation note  [place] Scale:  Flowsheet Row ED from 10/08/2022 in [facility] Emergency Department at [facility] Admission (Discharged) from 02/19/2022 in [facility] Pre-Admission Testing 60 from 02/16/2022 in [facility]  C-SSRS RISK CATEGORY High Risk No Risk Error: Question 6 not populated       Past Medical History:  Past Medical History:  Diagnosis Date   Anginal pain  (HCC)    Anxiety and depression    Arthritis    Asthma    CAD (coronary artery disease)    a. s/p BMS to RCA and LCX 2008; b. ISR of RCA rx'd with Xience DES 12/09; c. Requires extensive sedation for cath; d. cath 11/10: nonobs; e.Cath 2/12: nonobs; f. 05/2011 Cath: nonobs; g. 02/2013 nonobs; h. 07/2013 lat isch on nuc-->cath: nonobstructive, EF 60-65%.   Chronic chest pain    Colitis    IBS   Complication of anesthesia    Diabetes mellitus without complication (HCC)    FUO (fever of unknown origin) 11/15/2019   GERD (gastroesophageal reflux disease)    H/O ETOH abuse    H/O: suicide attempt    a. multiple   Hyperlipidemia    Hypertension    Hypothyroidism    IBS (irritable bowel syndrome)    Myocardial infarction (HCC) 2008   Obesity    Pneumonia    Tobacco abuse     Past Surgical History:  Procedure Laterality Date   ARTHROSCOPY KNEE W/ DRILLING Left    CARDIAC CATHETERIZATION  2008   stents   CARPAL TUNNEL RELEASE     CATARACT EXTRACTION W/ INTRAOCULAR LENS IMPLANT Bilateral 11/2020   CESAREAN SECTION     x 3   COLON BIOPSY  08/12/2008   LEFT HEART CATHETERIZATION WITH CORONARY ANGIOGRAM  06/04/2011   Procedure: LEFT HEART CATHETERIZATION WITH CORONARY ANGIOGRAM;  Surgeon: Kathleene Hazel, MD;  Location: [facility] CATH LAB;  Service: Cardiovascular;;   LEFT HEART CATHETERIZATION WITH CORONARY ANGIOGRAM N/A 02/22/2013   Procedure: LEFT HEART CATHETERIZATION WITH CORONARY ANGIOGRAM;  Surgeon: Roslyn Smiling Nahser,  MD;  Location: [facility] CATH LAB;  Service: Cardiovascular;  Laterality: N/A;   LEFT HEART CATHETERIZATION WITH CORONARY ANGIOGRAM N/A 07/25/2013   Procedure: LEFT HEART CATHETERIZATION WITH CORONARY ANGIOGRAM;  Surgeon: Lesleigh Noe, MD;  Location: [facility] CATH LAB;  Service: Cardiovascular;  Laterality: N/A;   PTCA     stent   removal ganglion Left    removal of breast tumor Right 1973   REVERSE SHOULDER ARTHROPLASTY Right 02/19/2022   Procedure: REVERSE SHOULDER  ARTHROPLASTY;  Surgeon: Yolonda Kida, MD;  Location: [facility] ORS;  Service: Orthopedics;  Laterality: Right;   ROTATOR CUFF REPAIR Right 2013   TOTAL HIP ARTHROPLASTY Left 05/20/2020   Procedure: TOTAL HIP ARTHROPLASTY ANTERIOR APPROACH;  Surgeon: Durene Romans, MD;  Location: [facility] ORS;  Service: Orthopedics;  Laterality: Left;  70 mins   TUBAL LIGATION  1982   Family History:  Family History  Problem Relation Age of Onset   Coronary artery disease Mother    Emphysema Mother    Colon cancer Father    Prostate cancer Father    Family Psychiatric  History: see initial Psychiatric evaluation note Social History:  Social History   Substance and Sexual Activity  Alcohol Use Yes   Alcohol/week: 0.0 standard drinks of alcohol   Comment: rarely     Social History   Substance and Sexual Activity  Drug Use No    Social History   Socioeconomic History   Marital status: Divorced    Spouse name: Not on file   Number of children: 3   Years of education: Not on file   Highest education level: Not on file  Occupational History    Comment: Workmen's comp  Tobacco Use   Smoking status: Former    Packs/day: 0.25    Years: 43.00    Additional pack years: 0.00    Total pack years: 10.75    Types: Cigarettes    Quit date: 11/07/2019    Years since quitting: 2.9   Smokeless tobacco: Never  Vaping Use   Vaping Use: Never used  Substance and Sexual Activity   Alcohol use: Yes    Alcohol/week: 0.0 standard drinks of alcohol    Comment: rarely   Drug use: No   Sexual activity: Not Currently  Other Topics Concern   Not on file  Social History Narrative   Not on file   Social Determinants of Health   Financial Resource Strain: Not on file  Food Insecurity: No Food Insecurity (02/19/2022)   Hunger Vital Sign    Worried About Running Out of Food in the Last Year: Never true    Ran Out of Food in the Last Year: Never true  Transportation Needs: No Transportation Needs (02/19/2022)    PRAPARE - Administrator, Civil Service (Medical): No    Lack of Transportation (Non-Medical): No  Physical Activity: Not on file  Stress: Not on file  Social Connections: Not on file    Sleep: Good  Appetite:  Good  Current Medications: Current Facility-Administered Medications  Medication Dose Route Frequency Provider Last Rate Last Admin   albuterol (PROVENTIL) (2.5 MG/3ML) 0.083% nebulizer solution 2.5 mg  2.5 mg Nebulization Q4H PRN Cathren Laine, MD       alum & mag hydroxide-simeth (MAALOX/MYLANTA) 200-200-20 MG/5ML suspension 30 mL  30 mL Oral Q8H PRN Cardama, Amadeo Garnet, MD   30 mL at 10/09/22 2351   aspirin chewable tablet 81 mg  81 mg Oral Daily Cathren Laine,  MD   81 mg at 10/10/22 0908   atorvastatin (LIPITOR) tablet 40 mg  40 mg Oral q1800 Cathren Laine, MD   40 mg at 10/09/22 1754   clopidogrel (PLAVIX) tablet 75 mg  75 mg Oral Daily Cathren Laine, MD   75 mg at 10/10/22 0911   empagliflozin (JARDIANCE) tablet 10 mg  10 mg Oral Daily Cathren Laine, MD   10 mg at 10/10/22 0910   furosemide (LASIX) tablet 20 mg  20 mg Oral Daily Cathren Laine, MD   20 mg at 10/10/22 0911   irbesartan (AVAPRO) tablet 37.5 mg  37.5 mg Oral Daily Cathren Laine, MD   37.5 mg at 10/10/22 0910   levothyroxine (SYNTHROID) tablet 88 mcg  88 mcg Oral Q0600 Cathren Laine, MD   88 mcg at 10/10/22 0510   metoprolol succinate (TOPROL-XL) 24 hr tablet 25 mg  25 mg Oral Daily Cathren Laine, MD   25 mg at 10/10/22 0908   pantoprazole (PROTONIX) EC tablet 40 mg  40 mg Oral Daily Cathren Laine, MD   40 mg at 10/10/22 0908   ranolazine (RANEXA) 12 hr tablet 500 mg  500 mg Oral BID Cathren Laine, MD   500 mg at 10/10/22 1610   spironolactone (ALDACTONE) tablet 12.5 mg  12.5 mg Oral Daily Cathren Laine, MD   12.5 mg at 10/10/22 0910   venlafaxine XR (EFFEXOR-XR) 24 hr capsule 150 mg  150 mg Oral QPC breakfast Cathren Laine, MD   150 mg at 10/10/22 0908   Current Outpatient Medications  Medication  Sig Dispense Refill   albuterol (VENTOLIN HFA) 108 (90 Base) MCG/ACT inhaler Inhale 2 puffs into the lungs every 4 (four) hours as needed.     aspirin 81 MG chewable tablet Chew 81 mg by mouth daily.     atorvastatin (LIPITOR) 40 MG tablet Take 1 tablet (40 mg total) by mouth daily at 6 PM. NEEDS APPOINTMENT FOR FUTURE REFILLS (Patient taking differently: Take 40 mg by mouth every evening.) 30 tablet 0   clopidogrel (PLAVIX) 75 MG tablet Take 1 tablet (75 mg total) by mouth daily. NEEDS APPOINTMENT FOR FUTURE REFILLS OR 90-DAY SUPPLY (Patient taking differently: Take 75 mg by mouth daily after supper. NEEDS APPOINTMENT FOR FUTURE REFILLS OR 90-DAY SUPPLY) 30 tablet 0   diazepam (VALIUM) 5 MG tablet Take 5-10 mg by mouth daily as needed for anxiety (anxiety).     furosemide (LASIX) 20 MG tablet Take 20 mg by mouth daily.     JARDIANCE 10 MG TABS tablet Take 10 mg by mouth daily.     lamoTRIgine (LAMICTAL) 200 MG tablet Take 200 mg by mouth daily after breakfast.     levothyroxine (SYNTHROID) 88 MCG tablet Take 88 mcg by mouth daily before breakfast.     loperamide (IMODIUM A-D) 2 MG tablet Take 2 mg by mouth 4 (four) times daily as needed for diarrhea or loose stools.     metoprolol succinate (TOPROL-XL) 25 MG 24 hr tablet Take 1 tablet by mouth daily.     Multiple Vitamin (MULTIVITAMIN WITH MINERALS) TABS tablet Take 1 tablet by mouth daily after breakfast. Seniors     nicotine (NICODERM CQ - DOSED IN MG/24 HOURS) 21 mg/24hr patch Place 14 mg onto the skin as needed.     nitroGLYCERIN (NITROSTAT) 0.4 MG SL tablet Place 1 tablet (0.4 mg total) under the tongue every 5 (five) minutes as needed for chest pain. (Patient taking differently: Place 0.4 mg under the tongue every  5 (five) minutes x 3 doses as needed for chest pain.) 25 tablet 3   olmesartan (BENICAR) 5 MG tablet Take 5 mg by mouth daily after breakfast.     pantoprazole (PROTONIX) 40 MG tablet Take 40 mg by mouth daily. One hour before coffee      prazosin (MINIPRESS) 1 MG capsule Take 2 mg by mouth at bedtime. One hour before bedtime     ranolazine (RANEXA) 500 MG 12 hr tablet Take 500 mg by mouth in the morning and at bedtime.      spironolactone (ALDACTONE) 25 MG tablet Take 0.5 tablets by mouth daily.     traZODone (DESYREL) 100 MG tablet Take 100 mg by mouth at bedtime. On hour before bedtime     venlafaxine XR (EFFEXOR-XR) 150 MG 24 hr capsule Take 150 mg by mouth daily after breakfast. Take with 75 mg for a total of 225 mg after breakfast     venlafaxine XR (EFFEXOR-XR) 75 MG 24 hr capsule Take 75 mg by mouth daily after breakfast. Take with 150 mg for  a total of 225 mg after breakfast     methocarbamol (ROBAXIN) 500 MG tablet Take 1 tablet (500 mg total) by mouth every 6 (six) hours as needed for muscle spasms. (Patient not taking: Reported on 10/09/2022) 40 tablet 0   ondansetron (ZOFRAN) 4 MG tablet Take 1 tablet (4 mg total) by mouth every 8 (eight) hours as needed for nausea or vomiting. (Patient not taking: Reported on 10/09/2022) 20 tablet 0   oxyCODONE-acetaminophen (PERCOCET) 7.5-325 MG tablet Take 1 tablet by mouth every 4 (four) hours as needed for severe pain. (Patient not taking: Reported on 10/09/2022) 25 tablet 0    Lab Results:  Results for orders placed or performed during the hospital encounter of 10/08/22 (from the past 48 hour(s))  Comprehensive metabolic panel     Status: Abnormal   Collection Time: 10/08/22 11:00 PM  Result Value Ref Range   Sodium 137 135 - 145 mmol/L   Potassium 4.7 3.5 - 5.1 mmol/L    Comment: HEMOLYSIS AT THIS LEVEL MAY AFFECT RESULT   Chloride 100 98 - 111 mmol/L   CO2 24 22 - 32 mmol/L   Glucose, Bld 173 (H) 70 - 99 mg/dL    Comment: Glucose reference range applies only to samples taken after fasting for at least 8 hours.   BUN 16 8 - 23 mg/dL   Creatinine, Ser 1.61 0.44 - 1.00 mg/dL   Calcium 9.9 8.9 - 09.6 mg/dL   Total Protein 7.5 6.5 - 8.1 g/dL   Albumin 3.9 3.5 - 5.0 g/dL    AST 62 (H) 15 - 41 U/L    Comment: HEMOLYSIS AT THIS LEVEL MAY AFFECT RESULT   ALT 41 0 - 44 U/L    Comment: HEMOLYSIS AT THIS LEVEL MAY AFFECT RESULT   Alkaline Phosphatase 78 38 - 126 U/L   Total Bilirubin 1.4 (H) 0.3 - 1.2 mg/dL    Comment: HEMOLYSIS AT THIS LEVEL MAY AFFECT RESULT   GFR, Estimated >60 >60 mL/min    Comment: (NOTE) Calculated using the CKD-EPI Creatinine Equation (2021)    Anion gap 13 5 - 15    Comment: Performed at [facility], [address], [place], [place] [address]  Ethanol     Status: None   Collection Time: 10/08/22 11:00 PM  Result Value Ref Range   Alcohol, Ethyl (B) <10 <10 mg/dL    Comment: (NOTE) Lowest detectable  limit for serum alcohol is 10 mg/dL.  For medical purposes only. Performed at [facility], [address] [place], [place] [address]   CBC with Diff     Status: Abnormal   Collection Time: 10/08/22 11:00 PM  Result Value Ref Range   WBC 11.2 (H) 4.0 - 10.5 K/uL   RBC 4.32 3.87 - 5.11 MIL/uL   Hemoglobin 15.6 (H) 12.0 - 15.0 g/dL   HCT 60.4 (H) 54.0 - 98.1 %   MCV 108.8 (H) 80.0 - 100.0 fL   MCH 36.1 (H) 26.0 - 34.0 pg   MCHC 33.2 30.0 - 36.0 g/dL   RDW 19.1 47.8 - 29.5 %   Platelets 206 150 - 400 K/uL   nRBC 0.0 0.0 - 0.2 %   Neutrophils Relative % 63 %   Neutro Abs 6.9 1.7 - 7.7 K/uL   Lymphocytes Relative 26 %   Lymphs Abs 2.9 0.7 - 4.0 K/uL   Monocytes Relative 8 %   Monocytes Absolute 0.9 0.1 - 1.0 K/uL   Eosinophils Relative 1 %   Eosinophils Absolute 0.2 0.0 - 0.5 K/uL   Basophils Relative 1 %   Basophils Absolute 0.2 (H) 0.0 - 0.1 K/uL   Immature Granulocytes 1 %   Abs Immature Granulocytes 0.14 (H) 0.00 - 0.07 K/uL    Comment: Performed at [facility], [address], [place], [place] [address]  Acetaminophen level     Status: Abnormal   Collection Time: 10/08/22 11:00 PM  Result Value Ref Range   Acetaminophen (Tylenol), Serum <10 (L) 10 - 30 ug/mL     Comment: (NOTE) Therapeutic concentrations vary significantly. A range of 10-30 ug/mL  may be an effective concentration for many patients. However, some  are best treated at concentrations outside of this range. Acetaminophen concentrations >150 ug/mL at 4 hours after ingestion  and >50 ug/mL at 12 hours after ingestion are often associated with  toxic reactions.  Performed at [facility], [address], [place], [place] [address]   Salicylate level     Status: Abnormal   Collection Time: 10/08/22 11:00 PM  Result Value Ref Range   Salicylate Lvl <7.0 (L) 7.0 - 30.0 mg/dL    Comment: Performed at [facility], [address], [place], [place] [address]  SARS Coronavirus 2 by RT PCR (hospital order, performed in [facility] hospital lab) *cepheid single result test* Anterior Nasal Swab     Status: None   Collection Time: 10/09/22  7:36 AM   Specimen: Anterior Nasal Swab  Result Value Ref Range   SARS Coronavirus 2 by RT PCR NEGATIVE NEGATIVE    Comment: (NOTE) SARS-CoV-2 target nucleic acids are NOT DETECTED.  The SARS-CoV-2 RNA is generally detectable in upper and lower respiratory specimens during the acute phase of infection. The lowest concentration of SARS-CoV-2 viral copies this assay can detect is 250 copies / mL. A negative result does not preclude SARS-CoV-2 infection and should not be used as the sole basis for treatment or other patient management decisions.  A negative result may occur with improper specimen collection / handling, submission of specimen other than nasopharyngeal swab, presence of viral mutation(s) within the areas targeted by this assay, and inadequate number of viral copies (<250 copies / mL). A negative result must be combined with clinical observations, patient history, and epidemiological information.  Fact Sheet for Patients:   RoadLapTop.co.za  Fact Sheet for Healthcare  Providers: http://kim-miller.com/  This  test is not yet approved or  cleared by the [place] and has been authorized for detection and/or diagnosis of SARS-CoV-2 by FDA under an Emergency Use Authorization (EUA).  This EUA will remain in effect (meaning this test can be used) for the duration of the COVID-19 declaration under Section 564(b)(1) of the Act, 21 U.S.C. section 360bbb-3(b)(1), unless the authorization is terminated or revoked sooner.  Performed at [facility], [address] [place], [place] [address]     Blood Alcohol level:  Lab Results  Component Value Date   ETH <10 10/08/2022   ETH <5 07/23/2014    Physical Findings:  CIWA:    COWS:     Musculoskeletal: Strength & Muscle Tone: within normal limits Gait & Station: normal Patient leans: Front  Psychiatric Specialty Exam:  Presentation  General Appearance:  Casual; Neat  Eye Contact: Good  Speech: Clear and Coherent; Normal Rate  Speech Volume: Normal  Handedness: Right   Mood and Affect  Mood: Depressed; Anxious  Affect: Congruent; Depressed   Thought Process  Thought Processes: Coherent; Goal Directed; Linear  Descriptions of Associations:Intact  Orientation:Full (Time, Place and Person)  Thought Content:Logical  History of Schizophrenia/Schizoaffective disorder:No  Duration of Psychotic Symptoms:No data recorded Hallucinations:Hallucinations: None  Ideas of Reference:None  Suicidal Thoughts:Suicidal Thoughts: No  Homicidal Thoughts:Homicidal Thoughts: No   Sensorium  Memory: Immediate Good; Recent Good; Remote Good  Judgment: Intact  Insight: Present   Executive Functions  Concentration: Good  Attention Span: Good  Recall: Good  Fund of Knowledge: Good  Language: Good   Psychomotor Activity  Psychomotor Activity: Psychomotor Activity: Normal   Assets  Assets: Communication Skills; Desire  for Improvement; Housing; Social Support   Sleep  Sleep: Sleep: Good    Physical Exam: Physical Exam Vitals and nursing note reviewed.  Constitutional:      Appearance: She is obese.  HENT:     Head: Normocephalic and atraumatic.     Nose: Nose normal.  Cardiovascular:     Rate and Rhythm: Normal rate and regular rhythm.  Pulmonary:     Effort: Pulmonary effort is normal.  Musculoskeletal:        General: Normal range of motion.     Cervical back: Normal range of motion.  Neurological:     Mental Status: She is alert and oriented to person, place, and time.  Psychiatric:        Attention and Perception: Attention and perception normal.        Mood and Affect: Mood is anxious and depressed.        Speech: Speech normal.        Behavior: Behavior is cooperative.        Thought Content: Thought content normal.        Cognition and Memory: Cognition and memory normal.        Judgment: Judgment is impulsive.    Review of Systems  Constitutional: Negative.   HENT: Negative.    Eyes: Negative.   Respiratory: Negative.    Cardiovascular: Negative.   Gastrointestinal: Negative.   Genitourinary: Negative.   Musculoskeletal: Negative.   Skin: Negative.   Endo/Heme/Allergies: Negative.   Psychiatric/Behavioral:  Positive for depression. The patient is nervous/anxious.    Blood pressure 131/73, pulse 67, temperature 98 F (36.7 C), temperature source Oral, resp. rate 16, height 5' (1.524 m), weight 97.5 kg, SpO2 100 %. Body mass index is 41.99 kg/m.   Medical Decision Making: Patient denies SI/HI/AVH but  based on her recent OD and previous suicide attempts and her living alone she continues to require inpatient mental healthcare for safety and stabilization.  We will fax out records to facilities for bed placement.  Problem 1: Suicide attempt  Admit, seek bed placement. Earney Navy, NP-PMHNP-BC 10/10/2022, 4:11 PM

## 2022-10-10 NOTE — Progress Notes (Signed)
LCSW Progress Note  [id number]   [facility]  10/10/2022  5:22 PM    Inpatient Behavioral Health Placement  Pt meets inpatient criteria per Earney Navy, NP-PMHNP . There are no available beds within [facility] system per [facility] Antoinette Cillo, RN. Referral was sent to the following facilities;    estination  Service Provider Address Phone Fax  [facility]  [address], [place] [address] [phone]  [facility] [address]  [address] [place] [address] [phone]  [facility]  [address] [place], [place] [address] [phone]  [facility]  [address] [place] [address] [phone]  [facility]  [address], [place] [address] [phone]  [facility]  [address] [place] [address] [phone]  [facility]  [address], [place] [address] [phone]  [facility]  [address], [place] [address] [phone]  [facility]  [address] [place] [place] [address] [phone]  [facility]  [address], [place] [address] [phone]  [facility]  [address], [place] [address] [phone]  [facility]  [address], [place] [address] [phone]  [facility]  [address] [place] [address] [phone]  [facility]  [address] [place] [address] [phone]  [facility]  [address] [place], [place] [address] [phone]  [facility]  [address] [place] [address] [phone]  [facility]  [address] [place] [address] [phone]  [facility]  [address], [place] [address] [phone]   Situation ongoing,  CSW will follow up.    Maryjean Ka, MSW, LCSWA 10/10/2022 5:22 PM

## 2022-10-10 NOTE — ED Provider Notes (Signed)
Emergency Medicine Observation Re-evaluation Note  Kara Lamb is a 67 y.o. female, seen on rounds today.  Pt initially presented to the ED for complaints of Suicidal Currently, the patient is sleeping.  Physical Exam  BP (!) 138/101 (BP Location: Right Arm)   Pulse 63   Temp 97.9 F (36.6 C) (Oral)   Resp 18   Ht 5' (1.524 m)   Wt 97.5 kg   SpO2 95%   BMI 41.99 kg/m  Physical Exam General: Sleeping Cardiac: Extremities perfused Lungs: Breathing is unlabored Psych: Deferred  ED Course / MDM  EKG:EKG Interpretation  Date/Time:  Friday October 08 2022 22:48:58 EDT Ventricular Rate:  73 PR Interval:  169 QRS Duration: 88 QT Interval:  387 QTC Calculation: 427 R Axis:   -46 Text Interpretation: Sinus rhythm Left anterior fascicular block Consider anterior infarct No significant change since last tracing Confirmed by Linwood Dibbles [phone]) on 10/08/2022 11:59:11 PM  I have reviewed the labs performed to date as well as medications administered while in observation.  Recent changes in the last 24 hours include evaluated by TTS yesterday.  Patient was criteria for inpatient psychiatric hospitalization.  Plan  Current plan is for inpatient psychiatric admission.    Gloris Manchester, MD 10/10/22 [phone]

## 2022-10-10 NOTE — ED Notes (Signed)
Patient up and alert. Patient cooperative. Patient medication compliant. Patient ate breakfast.

## 2022-10-11 ENCOUNTER — Emergency Department (HOSPITAL_COMMUNITY): Payer: Medicare HMO

## 2022-10-11 LAB — CBG MONITORING, ED: Glucose-Capillary: 227 mg/dL — ABNORMAL HIGH (ref 70–99)

## 2022-10-11 NOTE — ED Notes (Signed)
Pre-acceptance report given to [facility] hospital for possible review for placement/ acceptance.

## 2022-10-11 NOTE — ED Notes (Signed)
Speaking with family by phone, and security trying to coordinate her car pick-up.

## 2022-10-11 NOTE — ED Notes (Signed)
15 minutes spent with pt. Therapeutic listening. Acknowledged pt's patience, rolling with the process, and giving up some of her control of things. Encouraged pt to continue with the same as the plan develops. Requesting access to her purse to review her keys while she tries to plan for the unknown later on. Many questions answered. Request declined with rationale. Given phone for call. Sitter remains present.

## 2022-10-11 NOTE — ED Notes (Signed)
Safe Transport initiated. Pending arrival. Informed that pick up may be b/w 4-6 hrs. EMTALA will likely need to be redone at time of pick-up. Report will be called once she has left and is enroute per receiving facilities policy. Voluntary consent to transfer and treatment and rider waiver signed.   EDP, CN, and accepting facility notified.  Pt's Keys (car/house) have been secured with security in envelope, to be picked up by sister tomorrow.   All belongings secured, bagged and labeled ready to go with pt.   Pt informed sister via phone.  Pt remains manic, pleasant.

## 2022-10-11 NOTE — Progress Notes (Signed)
Pt was accepted to [facility] TODAY 10/11/2022  Pt meets inpatient criteria per Dahlia Byes, NP  Attending Physician will be Marvia Pickles, MD  Report can be called to: [phone]  Bed is ready now  Care Team Notified: Dahlia Byes, NP and Ella Bodo, RN  [place], [place]  10/11/2022 3:22 PM

## 2022-10-11 NOTE — ED Provider Notes (Signed)
Patient accepted to [facility].  Dr. Carola Rhine accepting doctor.   Virgina Norfolk, DO 10/11/22 1545

## 2022-10-11 NOTE — ED Notes (Addendum)
EDP at [facility]. Pt sitting in recliner. Given crayons and short pencil per request. Wants to right things down and make lists. Receiving call from sister now.

## 2022-10-11 NOTE — Progress Notes (Signed)
LCSW Progress Note  [id number]   Kara Lamb [facility]  10/11/2022  2:37 PM  Description:   Inpatient Psychiatric Referral  Patient was recommended inpatient per Dahlia Byes, NP. There are no available beds at [facility] or [facility] Gero unit, per [facility] Rona Ravens, RN. Patient was referred to the following out of network facilities:   Destination  Service Provider Address Phone Fax  [facility]  [address] [place] [address] [phone]  [facility] [address]  [address] [place] [address] [phone]  [facility]  [address] [place], [place] [address] [phone]  [facility]  [address] [place] [address] [phone]  [facility]  [address] [place] [address] [phone]  [facility]  [address] [place] [address] [phone]  [facility]  [address], [name] [address] [phone]  [facility]  [address] [place] [address] [phone]  [facility]  [address] [place] [address] [phone]  [facility]  [address], [place] [address] [phone]  [facility]  [address], [place] [address] [phone]  [facility]  [address], [place] [address] [phone]  [facility]  [address], [place] [address] [phone]  [facility]  [address], [place] [address] [phone]  [facility]  [address] [place], [place] [address] [phone]  [facility]  [address] [place] [address] [phone]  [facility]  [address], [place] [address] [phone]  [facility]  [address], [place] [address] [phone]    Situation ongoing, CSW to continue following and update chart as more information becomes available.      Asencion Islam  10/11/2022 2:37 PM

## 2022-10-11 NOTE — ED Notes (Addendum)
Re-iterated to Mikey Bussing Lajoyce Corners [phone] ), pt coming. Transport delayed. Will call report to [phone] when pt leaves. ETD~ 2000-2200 with Safe Transport [phone]).

## 2022-10-11 NOTE — ED Notes (Signed)
EDP rounding. Sitter, security and [employer] present. Breakfasts delivered.  

## 2022-10-11 NOTE — ED Notes (Signed)
Portable xray at [facility]. Mentions right shoulder pain since falling into concrete in February. H/o shoulder surgery.

## 2022-10-11 NOTE — ED Notes (Signed)
Received call from [employer].  They are in route to pick up patient.

## 2022-10-11 NOTE — ED Notes (Signed)
Meds given, one by one with dose and rationale. Pt pleasant and agreeable. Pt manic trying to plan, coordinate, think ahead, anticipate. Many questions answered. Unable to answer the plethora/ complete list of ongoing questions. Plan, process and timeframe discussed with rationale. Pt impulsive for attention. Patience discussed.

## 2022-10-11 NOTE — ED Notes (Signed)
Safe Transport initiated. Pending arrival. Informed that pick up may be b/w 4-6 hrs.

## 2022-10-11 NOTE — ED Notes (Signed)
Pt agitated and demanding to use the phone to call her sister. Pt has been told she has made 3 phone calls today and does not need to call her sister for the same reason again. Informed pt that her sister is aware of where she is going and will be picking up her car tomorrow to take it home. Pt continues to argue with sitter and nurse. Pt is coming out of her room intermittently changing the subject but continues to create an argument with sitter.

## 2022-10-11 NOTE — ED Notes (Signed)
Pt was given a sandwich and snacks earlier per her request, to take with her when she is transported. Pt keeps requesting more and more things to take with her when she leaves and is never satified.Pt ended up getting upset and threw away all the snacks that she was given. Pt was informed that she will not get anything else because she tossed everything. Pt also went into the bathroom and let out a huge yell because she is frustrated over a cup of water.

## 2022-10-11 NOTE — ED Notes (Signed)
Continues to be busy in room, standing, going from chair to bed to table. Standing and eating, arranging food items around table. Sitter present. No guarding of R arm/ shoulder, or obvious limitations or deficits.

## 2022-10-11 NOTE — ED Notes (Signed)
EDP rounding. Sitter, security and [employer] present. Breakfasts delivered.

## 2022-10-11 NOTE — ED Notes (Signed)
Pt with multiple requests as they cross her mind. Unable to keep up with here conflicting, changing requests. Plan process timeframe boundaries with rationale discussed. Pt tearful. Sitter and security present.

## 2022-10-11 NOTE — ED Provider Notes (Signed)
Emergency Medicine Observation Re-evaluation Note  Kara Lamb is a 67 y.o. female, seen on rounds today.  Pt initially presented to the ED for complaints of Suicidal Currently, the patient is awaiting inpatient psychiatric placement for suicide attempt.  Physical Exam  BP 133/80 (BP Location: Right Arm)   Pulse 61   Temp 98.4 F (36.9 C) (Oral)   Resp 20   Ht 5' (1.524 m)   Wt 97.5 kg   SpO2 97%   BMI 41.99 kg/m  Physical Exam General: *** Cardiac: *** Lungs: *** Psych: ***  ED Course / MDM  EKG:EKG Interpretation  Date/Time:  Friday October 08 2022 22:48:58 EDT Ventricular Rate:  73 PR Interval:  169 QRS Duration: 88 QT Interval:  387 QTC Calculation: 427 R Axis:   -46 Text Interpretation: Sinus rhythm Left anterior fascicular block Consider anterior infarct No significant change since last tracing Confirmed by Linwood Dibbles [phone]) on 10/08/2022 11:59:11 PM  I have reviewed the labs performed to date as well as medications administered while in observation.  Recent changes in the last 24 hours include ***.  Plan  Current plan is for ***.awaiting inpatient psychiatric

## 2022-10-11 NOTE — ED Notes (Signed)
Pt was accepted to [facility] TODAY 10/11/2022  Pt meets inpatient criteria per Dahlia Byes, NP  Attending Physician will be Marvia Pickles, MD  Report can be called to: [phone]  Bed is ready now

## 2024-03-20 NOTE — Progress Notes (Signed)
 Kara CHRISTELLA Blanch, MD, FAAFP   [facility]  - [facility] [address] [place] [address] Dept: [phone]  Dept Fax: [phone]  Kara Lamb DOB: [birth year]  Encounter Date: 03/20/2024    Chief Complaint  Patient presents with   Follow-up    Assessment/Plan   1. Simple chronic bronchitis    (CMD)   2. Nocturnal hypoxia   3. Diabetic polyneuropathy associated with type 2 diabetes mellitus    (CMD)   4. Alveolar hypoventilation   5. Cigarette smoker   6. Solar dermatitis   7. Class 3 obesity with alveolar hypoventilation, serious comorbidity, and body mass index (BMI) of 40.0 to 44.9 in adult (CMD)   8. Essential hypertension   9. Hypoxemia associated with sleep       PLAN: Patient Instructions  Order overnight oximetry on oxygen . May need OSA testing.  Solar dermatitis. Use sun cover up. Mometasone cream for itching and rash. Try to stop nabumetone.  Lab surveillance.  Diabetic eye, foot, and diet care recommended. Continue current medication.  BP is well controlled.  Continue [facility] follow up for PTSD.  Tdap, COVID vaccines at pharmacy.  TOBACCO CESSATION INTERVENTION: Tobacco cessation advised.   Health risks of tobacco were reviewed. Encouraged to set a quit date. Recommend www.quitline[place].com  and 1-800-QUITNOW - a free phone based tobacco cessation counseling service.    Scheduled Future Appointments       Provider Department Dept Phone Center   04/20/2024 10:45 AM Kara Dicker [facility] [place] [phone] [facility] [address]   06/19/2024 2:00 PM [facility] GI NURSE [facility] [place] Arrive at: [facility] Video/Phone Visit [phone] [facility]   08/27/2024 1:20 PM Ernani Berneda [facility]  - [facility] [place] [phone] [phone]        Subjective  Kara Lamb is a 68 y.o. female who presents for  Chief Complaint  Patient presents with   Follow-up  .  Issues addressed by patient and physician today include:  PROBLEM #1:  COB.  Tobacco. Pulmonary visit discussed.  Planning repeat ONO with [facility] while using nocturnal O2.  Using nocturnal oxygen  2 LPM at bedtime.  May need OSA testing. Still smoking.    PROBLEM #2:  Rash. Forearms and upper central chest with itching and rash for a few weeks.  Start nabumetone in around that time.  PROBLEM #3: DM2.  HTN.  Chronic Pain.  BMI. Office BP is well controlled. Has lost some weight.  Chronic pain improving with buprenorphine.  Seeing pain clinic. No new foot symptoms.  Seems to be taking medications as recommended.  PROBLEM #4:  PTSD. Follows at [facility].  Feels she is making progress.  Review of Systems  Constitutional:  Positive for fatigue. Negative for fever.  HENT:  Negative for trouble swallowing.   Respiratory:  Positive for cough. Negative for shortness of breath.   Cardiovascular:  Negative for chest pain.  Gastrointestinal:  Negative for abdominal pain.  Endocrine: Positive for heat intolerance.  Genitourinary:  Negative for difficulty urinating.  Musculoskeletal:  Positive for arthralgias and myalgias.  Skin:  Positive for rash (rash arms and chest).  Neurological:  Negative for weakness.  Psychiatric/Behavioral:  Negative for decreased concentration.     Objective  BP 119/68 (BP Location: Left arm, Patient Position: Sitting)   Pulse 78  Temp 97.3 F (36.3 C) (Temporal)   Ht 1.524 m (5')   Wt 94.9 kg (209 lb 3.2 oz)   SpO2 98%   BMI 40.86 kg/m  BP Readings from Last 3 Encounters:  03/20/24 119/68  02/21/24 136/62  11/02/23 133/79   Pulse Readings from Last 3 Encounters:  03/20/24 78  02/21/24 78  11/02/23 95   Wt Readings from Last 3 Encounters:  03/20/24 94.9 kg (209 lb 3.2 oz)  02/21/24 97.2 kg (214 lb 3.2 oz)  11/02/23 97.3 kg (214 lb 6.4  oz)   Ht Readings from Last 3 Encounters:  03/20/24 1.524 m (5')  02/21/24 1.524 m (5')  11/02/23 1.524 m (5')   Body mass index is 40.86 kg/m. No LMP recorded.  PHYSICAL EXAM: GENERAL APPEARANCE: Well appearing, well developed, NAD HEART: Regular rate and rhythm. EXTREMITIES: Without clubbing, cyanosis, or edema NEUROLOGIC: Alert and oriented x 3.  Answered questions appropriately. SKIN:  Papular eruption on erythematous base of sun exposed forearms and upper central chest areas.  Labs: Lab Results  Component Value Date   HGBA1C 5.8 (H) 08/02/2023     Chemistry      Component Value Date/Time   NA 138 08/02/2023 1623   K 4.6 08/02/2023 1623   CL 98 08/02/2023 1623   CO2 31 08/02/2023 1623   BUN 10 08/02/2023 1623   CREATININE 0.75 08/02/2023 1623      Component Value Date/Time   CALCIUM  9.6 08/02/2023 1623   AST 23 08/02/2023 1623   ALT 17 08/02/2023 1623   BILITOT 0.7 08/02/2023 1623        Orders Placed This Encounter  Medications   mometasone (ELOCON) 0.1 % cream    Sig: Apply topically daily.    Dispense:  45 g    Refill:  0    Orders Placed This Encounter  Procedures   CBC without Differential   Comprehensive Metabolic Panel   Hemoglobin A1C With Estimated Average Glucose   CBC without Differential    Requested Prescriptions   Signed Prescriptions Disp Refills   mometasone (ELOCON) 0.1 % cream 45 g 0    Sig: Apply topically daily.     Medications Discontinued During This Encounter  Medication Reason   triamcinolone  (KENALOG ) 0.1 % cream Therapy completed      Problem List[1]  Health Maintenance Status       Date Due Completion Dates   Bone Density Scan Never done ---   Diabetes: Retinopathy Screening Combo Never done ---   Hepatitis C Screening Never done ---   DTaP/Tdap/Td Vaccines (1 - Tdap) 05/31/2021 05/30/2021   Colorectal Cancer Screening 12/13/2022 12/13/2019, 12/13/2019   Diabetes: Foot Exam 01/11/2024 01/11/2023,  01/11/2023   Comment on 08/09/2022: See Legacy System   Comment on 02/17/2022: See Legacy System   COVID-19 Vaccine (6 - 2024-25 season) 02/13/2024 02/09/2023, 09/01/2021   Diabetes:  eGFR for Kidney Evaluation 08/01/2024 08/02/2023, 02/09/2023   Diabetes:  Quantitative uACR for Kidney Evaluation 08/01/2024 08/02/2023   Diabetes: Hemoglobin A1C 08/01/2024 08/02/2023, 08/02/2023   Comprehensive Annual Visit 08/21/2024 08/22/2023, 08/09/2022   Lung Cancer Screening 11/08/2024 11/09/2023   Depression Screening 03/20/2025 03/20/2024, 04/28/2023   Breast Cancer Screening (Mammogram) 08/11/2025 08/12/2023, 08/11/2023   Comment on 05/12/2021: See Legacy System        Immunization History  Administered Date(s) Administered   Covid-19 Vaccine Unspecified 09/01/2021   Influenza, Unspecified 02/13/2012, 02/27/2013, 03/12/2014, 03/14/2018, 02/16/2019, 02/28/2019, 03/20/2021   Influenza, high-dose, trivalent, PF 04/02/2022, 02/07/2023, 02/14/2024  Influenza, split virus, trivalent, preservative 03/15/2011   MMR 10/04/2023   Pfizer COVID-19 12+yrs (aka Comirnaty) 02/09/2023   Pfizer SARS-CoV-2 Primary Series 12+ yrs 08/29/2019, 09/24/2019, 09/21/2020   Pneumococcal Conjugate 13-Valent 02/17/2011   Pneumococcal Conjugate Vaccine 20-Valent (PREVNAR-20) 6 wks+ 09/25/2021   Pneumococcal Polysaccharide Vaccine, 23 Valent (PNEUMOVAX-23) 2Y+ 12/13/2011   RSV recombinant,PF(AREXVY)50Y+, not Medicare 04/16/2022   Td, Absorbed, Preservative Free, Adult Use, Lf Unspecified 05/30/2021   Varicella [facility]) 18Y+ 01/23/2019, 01/23/2020    Surgical History[2]  Family History[3]  Tobacco Use History[4] Social History   Substance and Sexual Activity  Alcohol  Use Not Currently   Alcohol /week: 2.0 standard drinks of alcohol    Comment: one drink every 2 months   Social History   Substance and Sexual Activity  Drug Use Yes   Types: Other   Comment: CBD gummies 3-5x weekly     Allergies[5]   Kara CHRISTELLA Blanch, MD, FAAFP  Encounter Date: 03/20/2024        [1] Patient Active Problem List Diagnosis   CAD in native artery   Dyslipidemia   Essential hypertension   H/O heart artery stent   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    (CMD)   Barrett's esophagus without dysplasia   Gastroesophageal reflux disease   History of colonic polyps   Family history of colon cancer in father   Microscopic colitis   Cigarette smoker   Chronic low back pain with bilateral sciatica   Hypothyroidism (acquired)   History of COVID-19   Borderline personality disorder (HCC)   Rotator cuff syndrome   Swelling   Metatarsalgia of both feet   Pain in metatarsus of right foot   Diabetic polyneuropathy associated with type 2 diabetes mellitus    (CMD)   Macrocytosis without anemia   Carpal tunnel syndrome of left wrist   Fibromyalgia   Trigger finger   History of reverse total replacement of right shoulder joint   History of total hip replacement, left   Chronic post-traumatic stress disorder (PTSD)   Simple chronic bronchitis    (CMD)   Post-COVID chronic cough   Alveolar hypoventilation   Class 3 obesity with alveolar hypoventilation, serious comorbidity, and body mass index (BMI) of 40.0 to 44.9 in adult (CMD)   Hypoxemia associated with sleep   Nocturnal hypoxia   Solar dermatitis  [2] Past Surgical History: Procedure Laterality Date   BREAST SURGERY Right    Procedure: BREAST SURGERY; benign   CARPAL TUNNEL RELEASE Right    Procedure: CARPAL TUNNEL RELEASE   CESAREAN SECTION W/ TUBAL LIGATION     Procedure: CESAREAN SECTION W/ TUBAL LIGATION   CESAREAN SECTION, UNSPECIFIED     Procedure: CESAREAN SECTION; x3   CORONARY STENT PLACEMENT     Procedure: CORONARY STENT PLACEMENT; three stents, two in 2008 and one in 2009   FOOT GANGLION EXCISION Left    Procedure: GANGLION CYST EXCISION OF FOOT; x2    REVERSE TOTAL SHOULDER ARTHROPLASTY Right 02/19/2022   SHOULDER OPEN ROTATOR CUFF REPAIR Right    Procedure: SHOULDER OPEN ROTATOR CUFF REPAIR   TOTAL HIP ARTHROPLASTY Left 05/21/2020  [3] Family History Problem Relation Name Age of Onset   Heart disease Mother     Emphysema Mother     Depression Mother     Kidney cancer Mother     Colon cancer Father     Prostate cancer Father     Heart disease Father     Depression Father  Rheum arthritis Sister     Depression Sister     Depression Sister     Arthritis Sister     Depression Sister     Arthritis Sister     Diabetes Maternal Grandmother     Diabetes Maternal Grandfather    [4] Social History Tobacco Use  Smoking Status Every Day   Current packs/day: 1.00   Average packs/day: 1 pack/day for 52.4 years (52.4 ttl pk-yrs)   Types: Cigarettes   Start date: 11/09/1971  Smokeless Tobacco Never  Tobacco Comments   Currently smokes 8-11 cigs/day  [5] Allergies Allergen Reactions   Cat Dander Other (See Comments)    Other Reaction(s): Not available   Ciprofloxacin Other (See Comments)   Fluoxetine GI Bleeding   Heparin  Other (See Comments)    Injections in the stomach causes huge knots, tenderness, and pain for weeks (hematomas)   Heparin  (Porcine) Other (See Comments)    heparin , porcine   Isosorbide  Mononitrate Other (See Comments)    Headache   Nitrostat  Sr GI Intolerance    States severe HA after NTG drip   Paroxetine Diarrhea    Other Reaction(s): Diarrhea    Severe  paroxetine hydrochloride   Paroxetine Hcl Diarrhea    Severe   Perphenazine Other (See Comments)    Cannot recall   Sertraline  Diarrhea and GI Intolerance   Zolpidem  Other (See Comments)    Sleep walking and loss of memory

## 2024-05-28 NOTE — Telephone Encounter (Signed)
**  GENERAL/NEW REQUEST/QUESTION**  Caller Name: Pt calling to speak with nurse  About her surgery  Relation to Patient: [facility]  Callback [phone]  Organization (if applicable):  Pt Provider: Raylene  Office Location: [place]   Request/Question Details:

## 2024-05-28 NOTE — Progress Notes (Signed)
 Left message for Joen at Dr. Bertie office regarding Plavix  and Aspirin  orders for surgery scheduled 05/30/24.

## 2024-05-28 NOTE — Telephone Encounter (Signed)
 Copied from CRM #[id number]. Topic: Information Request - General Information Request/Update >> May 28, 2024  9:58 AM Laray DEL wrote: Markee, Remlinger is calling other request    Include all details related to the request(s) below: Patient called to make sure Dr Magdaline sees her message and has request completed today Dr Magdaline,  I spoke to [facility] 05/25/24.    They said this can be done OVER THE PHONE . PLEASE CALL ASAP  (YOU or a medical staff person) at their Provider Services Line [phone].   Tell  them you NEED to do a Verification For Food & Home Benefits ... Which is basically is giving them & verifying my Diagnoses that they consider Chronically Ill Diagnosis.    AND THEN THEY WILL SEND ME my Food Card.    Thank you. so very much!  Merry Christmas!   Confirm and type the Best Contact Number below:  Patient/caller contact number:  Ronal [phone]           [] Home  [x] Mobile  [] Work [] Other   [x] Okay to leave a voicemail   Medication List:  Current Outpatient Medications:    albuterol  HFA (Ventolin  HFA) 90 mcg/actuation inhaler, Inhale 2 puffs every 4 (four) hours as needed for wheezing or shortness of breath., Disp: 18 each, Rfl: 11   aspirin  81 mg chewable tablet, Chew 81 mg daily., Disp: , Rfl:    atorvastatin  (LIPITOR) 40 mg tablet, Take 1 tablet (40 mg total) by mouth daily., Disp: 90 tablet, Rfl: 3   blood-glucose meter (OneTouch Verio Reflect Meter) misc, 1 each by miscellaneous route daily., Disp: 1 each, Rfl: 1   buprenorphine (BUTRANS) 7.5 mcg/hour ptwk patch, 1 patch.  1 patch to skin Transdermal every 7 days; Duration: 28 days, Disp: , Rfl:    clopidogreL  (PLAVIX ) 75 mg tablet, Take 1 tablet (75 mg total) by mouth daily., Disp: 90 tablet, Rfl: 3   diclofenac sodium (VOLTAREN) 1 % gel, Apply 1 g topically 4 (four) times a day as needed (pain)., Disp: 200 g, Rfl: 5   dulaglutide (Trulicity) 0.75 mg/0.5 mL subcutaneous pen injector, Inject  0.5 mL (0.75 mg total) under the skin every 7 days., Disp: 2 mL, Rfl: 11   dulaglutide (Trulicity) 0.75 mg/0.5 mL subcutaneous pen injector, Inject 0.5 mL (0.75 mg total) under the skin every 7 days., Disp: 2 mL, Rfl: 11   furosemide  (LASIX ) 20 mg tablet, Take 1 tablet (20 mg total) by mouth as needed (Daily, For Swelling)., Disp: 90 tablet, Rfl: 3   glucose blood (OneTouch Verio test strips) test strip, 1 each by Other route daily., Disp: 100 strip, Rfl: 4   ipratropium-albuteroL  (Combivent Respimat) 20-100 mcg/actuation inhaler, Inhale 1 puff 4 (four) times a day., Disp: 4 g, Rfl: 11   Jardiance  25 mg tab, TAKE 1 TABLET (25 MG TOTAL) BY MOUTH DAILY., Disp: 90 tablet, Rfl: 3   levothyroxine  (SYNTHROID ) 88 mcg tablet, TAKE 1 TABLET BY MOUTH EVERY DAY, Disp: 90 tablet, Rfl: 3   metoprolol  succinate (TOPROL  XL) 25 mg 24 hr tablet, Take 1 tablet (25 mg total) by mouth daily., Disp: 90 tablet, Rfl: 3   mometasone (ELOCON) 0.1 % cream, Apply topically daily., Disp: 45 g, Rfl: 0   multivitamin (THERAGRAN) tab tablet, Take 1 tablet by mouth daily., Disp: , Rfl:    nabumetone (RELAFEN) 500 mg tablet, Take 500 mg by mouth 2 (two) times a day., Disp: , Rfl:    nitroglycerin  (NITROSTAT ) 0.4 mg SL  tablet, Place 1 tablet (0.4 mg total) under the tongue every 5 (five) minutes as needed for chest pain., Disp: 25 tablet, Rfl: 1   nystatin (MYCOSTATIN) 100,000 unit/gram cream, Apply 1 Application topically 3 (three) times a day as needed., Disp: , Rfl:    olmesartan  (BENICAR ) 5 mg tablet, Take 1 tablet (5 mg total) by mouth daily., Disp: 90 tablet, Rfl: 2   ondansetron  (ZOFRAN ) 4 mg tablet, TAKE 1 TABLET BY MOUTH EVERY 8 HOURS AS NEEDED FOR NAUSEA AND VOMITING, Disp: 40 tablet, Rfl: 1   oxygen  (O2) gas, Nocturnal via nasal canula 2 LPM, Disp: 2 L, Rfl: 11   pantoprazole  (PROTONIX ) 40 mg EC tablet, TAKE 1 TABLET BY MOUTH EVERY DAY, Disp: 90 tablet, Rfl: 3   prazosin  (MINIPRESS ) 5 mg capsule, Take 5  mg by mouth nightly., Disp: , Rfl:    ranolazine  (RANEXA ) 500 mg 12 hr tablet, TAKE 1 TABLET BY MOUTH TWICE A DAY, Disp: 180 tablet, Rfl: 3   spironolactone  (ALDACTONE ) 25 mg tablet, Take 0.5 tablets (12.5 mg total) by mouth daily., Disp: 45 tablet, Rfl: 2   traZODone  (DESYREL ) 100 mg tablet, Take 100 mg by mouth nightly., Disp: , Rfl:    venlafaxine  (EFFEXOR  XR) 150 mg 24 hr capsule, Take 150 mg by mouth Once Daily., Disp: , Rfl:    venlafaxine  (EFFEXOR ) 37.5 mg tablet, Take 37.5 mg by mouth daily., Disp: , Rfl:      Medication Request/Refills: Pharmacy Information (if applicable)   [x] Not Applicable       []  Pharmacy listed  Send Medication Request to:                                                 [] Pharmacy not listed (added to pharmacy list in Epic) Send Medication Request to:      Listed Pharmacies: [id number] [place], [place] - [address] - PHONE: [phone] - FAX: [phone]

## 2024-05-29 ENCOUNTER — Encounter (HOSPITAL_BASED_OUTPATIENT_CLINIC_OR_DEPARTMENT_OTHER)
Admission: RE | Admit: 2024-05-29 | Discharge: 2024-05-29 | Disposition: A | Source: Ambulatory Visit | Attending: Orthopedic Surgery

## 2024-05-29 ENCOUNTER — Other Ambulatory Visit: Payer: Self-pay

## 2024-05-29 ENCOUNTER — Encounter (HOSPITAL_BASED_OUTPATIENT_CLINIC_OR_DEPARTMENT_OTHER): Payer: Self-pay | Admitting: Orthopedic Surgery

## 2024-05-29 DIAGNOSIS — I252 Old myocardial infarction: Secondary | ICD-10-CM | POA: Diagnosis not present

## 2024-05-29 DIAGNOSIS — Z01812 Encounter for preprocedural laboratory examination: Secondary | ICD-10-CM | POA: Insufficient documentation

## 2024-05-29 DIAGNOSIS — M1812 Unilateral primary osteoarthritis of first carpometacarpal joint, left hand: Secondary | ICD-10-CM | POA: Diagnosis not present

## 2024-05-29 DIAGNOSIS — I25118 Atherosclerotic heart disease of native coronary artery with other forms of angina pectoris: Secondary | ICD-10-CM | POA: Diagnosis not present

## 2024-05-29 DIAGNOSIS — I2510 Atherosclerotic heart disease of native coronary artery without angina pectoris: Secondary | ICD-10-CM | POA: Diagnosis not present

## 2024-05-29 DIAGNOSIS — E119 Type 2 diabetes mellitus without complications: Secondary | ICD-10-CM | POA: Diagnosis not present

## 2024-05-29 DIAGNOSIS — E66813 Obesity, class 3: Secondary | ICD-10-CM | POA: Diagnosis not present

## 2024-05-29 DIAGNOSIS — F17210 Nicotine dependence, cigarettes, uncomplicated: Secondary | ICD-10-CM | POA: Diagnosis not present

## 2024-05-29 DIAGNOSIS — Z955 Presence of coronary angioplasty implant and graft: Secondary | ICD-10-CM | POA: Diagnosis not present

## 2024-05-29 DIAGNOSIS — G5602 Carpal tunnel syndrome, left upper limb: Secondary | ICD-10-CM | POA: Diagnosis present

## 2024-05-29 DIAGNOSIS — I10 Essential (primary) hypertension: Secondary | ICD-10-CM | POA: Diagnosis not present

## 2024-05-29 DIAGNOSIS — K219 Gastro-esophageal reflux disease without esophagitis: Secondary | ICD-10-CM | POA: Diagnosis not present

## 2024-05-29 DIAGNOSIS — Z6841 Body Mass Index (BMI) 40.0 and over, adult: Secondary | ICD-10-CM | POA: Diagnosis not present

## 2024-05-29 DIAGNOSIS — M65352 Trigger finger, left little finger: Secondary | ICD-10-CM | POA: Diagnosis not present

## 2024-05-29 DIAGNOSIS — E039 Hypothyroidism, unspecified: Secondary | ICD-10-CM | POA: Diagnosis not present

## 2024-05-29 LAB — BASIC METABOLIC PANEL WITH GFR
Anion gap: 14 (ref 5–15)
BUN: 11 mg/dL (ref 8–23)
CO2: 26 mmol/L (ref 22–32)
Calcium: 10.3 mg/dL (ref 8.9–10.3)
Chloride: 99 mmol/L (ref 98–111)
Creatinine, Ser: 0.71 mg/dL (ref 0.44–1.00)
GFR, Estimated: >60 mL/min (ref >60)
Glucose, Bld: 102 mg/dL — ABNORMAL HIGH (ref 70–99)
Potassium: 4.6 mmol/L (ref 3.5–5.1)
Sodium: 139 mmol/L (ref 135–145)

## 2024-05-29 NOTE — Progress Notes (Signed)

## 2024-05-30 ENCOUNTER — Ambulatory Visit (HOSPITAL_BASED_OUTPATIENT_CLINIC_OR_DEPARTMENT_OTHER)
Admission: RE | Admit: 2024-05-30 | Discharge: 2024-05-30 | Disposition: A | Attending: Orthopedic Surgery | Admitting: Orthopedic Surgery

## 2024-05-30 ENCOUNTER — Ambulatory Visit (HOSPITAL_BASED_OUTPATIENT_CLINIC_OR_DEPARTMENT_OTHER): Admitting: Certified Registered"

## 2024-05-30 ENCOUNTER — Encounter (HOSPITAL_BASED_OUTPATIENT_CLINIC_OR_DEPARTMENT_OTHER): Payer: Self-pay | Admitting: Orthopedic Surgery

## 2024-05-30 ENCOUNTER — Encounter (HOSPITAL_BASED_OUTPATIENT_CLINIC_OR_DEPARTMENT_OTHER): Admission: RE | Disposition: A | Payer: Self-pay | Attending: Orthopedic Surgery

## 2024-05-30 DIAGNOSIS — M1812 Unilateral primary osteoarthritis of first carpometacarpal joint, left hand: Secondary | ICD-10-CM | POA: Insufficient documentation

## 2024-05-30 DIAGNOSIS — I10 Essential (primary) hypertension: Secondary | ICD-10-CM

## 2024-05-30 DIAGNOSIS — E119 Type 2 diabetes mellitus without complications: Secondary | ICD-10-CM | POA: Insufficient documentation

## 2024-05-30 DIAGNOSIS — M65352 Trigger finger, left little finger: Secondary | ICD-10-CM | POA: Insufficient documentation

## 2024-05-30 DIAGNOSIS — I2510 Atherosclerotic heart disease of native coronary artery without angina pectoris: Secondary | ICD-10-CM | POA: Insufficient documentation

## 2024-05-30 DIAGNOSIS — F17210 Nicotine dependence, cigarettes, uncomplicated: Secondary | ICD-10-CM | POA: Insufficient documentation

## 2024-05-30 DIAGNOSIS — G5602 Carpal tunnel syndrome, left upper limb: Secondary | ICD-10-CM | POA: Insufficient documentation

## 2024-05-30 DIAGNOSIS — Z955 Presence of coronary angioplasty implant and graft: Secondary | ICD-10-CM | POA: Insufficient documentation

## 2024-05-30 DIAGNOSIS — I25118 Atherosclerotic heart disease of native coronary artery with other forms of angina pectoris: Secondary | ICD-10-CM

## 2024-05-30 DIAGNOSIS — E66813 Obesity, class 3: Secondary | ICD-10-CM | POA: Insufficient documentation

## 2024-05-30 DIAGNOSIS — Z6841 Body Mass Index (BMI) 40.0 and over, adult: Secondary | ICD-10-CM | POA: Insufficient documentation

## 2024-05-30 DIAGNOSIS — E039 Hypothyroidism, unspecified: Secondary | ICD-10-CM | POA: Insufficient documentation

## 2024-05-30 DIAGNOSIS — I252 Old myocardial infarction: Secondary | ICD-10-CM | POA: Insufficient documentation

## 2024-05-30 DIAGNOSIS — K219 Gastro-esophageal reflux disease without esophagitis: Secondary | ICD-10-CM | POA: Insufficient documentation

## 2024-05-30 HISTORY — PX: TRIGGER FINGER RELEASE: SHX641

## 2024-05-30 HISTORY — PX: CARPAL TUNNEL RELEASE: SHX101

## 2024-05-30 LAB — GLUCOSE, CAPILLARY
Glucose-Capillary: 121 mg/dL — ABNORMAL HIGH (ref 70–99)
Glucose-Capillary: 114 mg/dL — ABNORMAL HIGH (ref 70–99)

## 2024-05-30 SURGERY — CARPAL TUNNEL RELEASE
Anesthesia: Monitor Anesthesia Care | Site: Hand | Laterality: Left

## 2024-05-30 MED ORDER — EPHEDRINE SULFATE (PRESSORS) 25 MG/5ML IV SOSY
PREFILLED_SYRINGE | INTRAVENOUS | Status: DC | PRN
  Administered 2024-05-30 (×2): 10 mg via INTRAVENOUS

## 2024-05-30 MED ORDER — BUPIVACAINE HCL 0.25 % IJ SOLN
INTRAMUSCULAR | Status: DC | PRN
  Administered 2024-05-30: 15:00:00 10 mL

## 2024-05-30 MED ORDER — CEFAZOLIN SODIUM-DEXTROSE 2-4 GM/100ML-% IV SOLN
INTRAVENOUS | Status: AC
  Filled 2024-05-30: qty 100

## 2024-05-30 MED ORDER — ACETAMINOPHEN 500 MG PO TABS: 1000.0000 mg | ORAL_TABLET | Freq: Once | ORAL | Status: DC

## 2024-05-30 MED ORDER — ONDANSETRON HCL 4 MG/2ML IJ SOLN
INTRAMUSCULAR | Status: DC | PRN
  Administered 2024-05-30: 14:00:00 4 mg via INTRAVENOUS

## 2024-05-30 MED ORDER — DEXMEDETOMIDINE HCL IN NACL 80 MCG/20ML IV SOLN
INTRAVENOUS | Status: DC | PRN
  Administered 2024-05-30: 14:00:00 12 ug via INTRAVENOUS

## 2024-05-30 MED ORDER — LACTATED RINGERS IV SOLN: INTRAVENOUS | Status: DC

## 2024-05-30 MED ORDER — PROPOFOL 500 MG/50ML IV EMUL
INTRAVENOUS | Status: DC | PRN
  Administered 2024-05-30: 14:00:00 200 ug/kg/min via INTRAVENOUS

## 2024-05-30 MED ORDER — FENTANYL CITRATE (PF) 100 MCG/2ML IJ SOLN
INTRAMUSCULAR | Status: AC
  Filled 2024-05-30: qty 2

## 2024-05-30 MED ORDER — LIDOCAINE HCL 1 % IJ SOLN
INTRAMUSCULAR | Status: DC | PRN
  Administered 2024-05-30: 15:00:00 1 mL via INTRAMUSCULAR

## 2024-05-30 MED ORDER — MIDAZOLAM HCL 2 MG/2ML IJ SOLN
INTRAMUSCULAR | Status: AC
  Filled 2024-05-30: qty 2

## 2024-05-30 MED ORDER — LACTATED RINGERS IV SOLN: INTRAVENOUS | Status: DC | PRN

## 2024-05-30 MED ORDER — FENTANYL CITRATE (PF) 100 MCG/2ML IJ SOLN
INTRAMUSCULAR | Status: DC | PRN
  Administered 2024-05-30: 14:00:00 100 ug via INTRAVENOUS

## 2024-05-30 MED ORDER — ONDANSETRON HCL 4 MG/2ML IJ SOLN
INTRAMUSCULAR | Status: AC
  Filled 2024-05-30: qty 2

## 2024-05-30 MED ORDER — MIDAZOLAM HCL 5 MG/5ML IJ SOLN
INTRAMUSCULAR | Status: DC | PRN
  Administered 2024-05-30: 14:00:00 2 mg via INTRAVENOUS

## 2024-05-30 MED ORDER — CEFAZOLIN SODIUM-DEXTROSE 2-4 GM/100ML-% IV SOLN: 2.0000 g | INTRAVENOUS | Status: DC

## 2024-05-30 MED ORDER — PROPOFOL 10 MG/ML IV BOLUS
INTRAVENOUS | Status: AC
  Filled 2024-05-30: qty 20

## 2024-05-30 MED ORDER — OXYCODONE HCL 5 MG PO TABS: 5.0000 mg | ORAL_TABLET | ORAL | 0 refills | Status: AC | PRN

## 2024-05-30 MED ORDER — FENTANYL CITRATE (PF) 100 MCG/2ML IJ SOLN: 25.0000 ug | INTRAMUSCULAR | Status: DC | PRN

## 2024-05-30 MED ORDER — LIDOCAINE 2% (20 MG/ML) 5 ML SYRINGE
INTRAMUSCULAR | Status: AC
  Filled 2024-05-30: qty 5

## 2024-05-30 SURGICAL SUPPLY — 28 items
BLADE SURG 15 STRL LF DISP TIS (BLADE) ×1 IMPLANT
BNDG COMPR ESMARK 4X3 LF (GAUZE/BANDAGES/DRESSINGS) ×1 IMPLANT
BNDG ELASTIC 2INX 5YD STR LF (GAUZE/BANDAGES/DRESSINGS) ×1 IMPLANT
BNDG ELASTIC 3INX 5YD STR LF (GAUZE/BANDAGES/DRESSINGS) ×1 IMPLANT
BNDG GAUZE DERMACEA FLUFF 4 (GAUZE/BANDAGES/DRESSINGS) ×1 IMPLANT
CHLORAPREP W/TINT 26 (MISCELLANEOUS) ×1 IMPLANT
CORD BIPOLAR FORCEPS 12FT (ELECTRODE) ×1 IMPLANT
COVER BACK TABLE 60X90IN (DRAPES) ×1 IMPLANT
CUFF TOURN SGL QUICK 18X4 (TOURNIQUET CUFF) ×1 IMPLANT
CUFF TRNQT CYL 24X4X16.5-23 (TOURNIQUET CUFF) IMPLANT
DRAPE EXTREMITY T 121X128X90 (DISPOSABLE) ×1 IMPLANT
DRAPE SURG 17X23 STRL (DRAPES) ×1 IMPLANT
GAUZE SPONGE 4X4 12PLY STRL (GAUZE/BANDAGES/DRESSINGS) IMPLANT
GAUZE XEROFORM 1X8 LF (GAUZE/BANDAGES/DRESSINGS) ×1 IMPLANT
GLOVE BIO SURGEON STRL SZ7 (GLOVE) ×1 IMPLANT
GLOVE BIOGEL PI IND STRL 7.0 (GLOVE) ×1 IMPLANT
GOWN STRL REUS W/ TWL LRG LVL3 (GOWN DISPOSABLE) ×2 IMPLANT
NDL HYPO 25X1 1.5 SAFETY (NEEDLE) ×1 IMPLANT
NEEDLE HYPO 25X1 1.5 SAFETY (NEEDLE) ×1 IMPLANT
PACK BASIN DAY SURGERY FS (CUSTOM PROCEDURE TRAY) ×1 IMPLANT
SHEET MEDIUM DRAPE 40X70 STRL (DRAPES) ×1 IMPLANT
SOLN 0.9% NACL POUR BTL 1000ML (IV SOLUTION) ×1 IMPLANT
SUT ETHILON 4 0 PS 2 18 (SUTURE) ×1 IMPLANT
SUT VIC AB 4-0 PS2 18 (SUTURE) IMPLANT
SYR BULB EAR ULCER 3OZ GRN STR (SYRINGE) ×1 IMPLANT
SYR CONTROL 10ML LL (SYRINGE) ×1 IMPLANT
TOWEL GREEN STERILE FF (TOWEL DISPOSABLE) ×2 IMPLANT
UNDERPAD 30X36 HEAVY ABSORB (UNDERPADS AND DIAPERS) ×1 IMPLANT

## 2024-05-30 NOTE — Interval H&P Note (Signed)
 History and Physical Interval Note:  05/30/2024 1:53 PM  [facility]  has presented today for surgery, with the diagnosis of Left carpal tunnel syndrome, left thumb carpometacarpal arthritis, LEFT SMALL FINGER TRIGGER FINGER.  The various methods of treatment have been discussed with the patient and family. After consideration of risks, benefits and other options for treatment, the patient has consented to  Procedures with comments: CARPAL TUNNEL RELEASE (Left) - Left carpal tunnel release, left thumb carpometacarpal injection RELEASE, A1 PULLEY, FOR TRIGGER FINGER (Left) - LEFT SMALL FINGER as a surgical intervention.  The patient's history has been reviewed, patient examined, no change in status, stable for surgery.  I have reviewed the patient's chart and labs.  Questions were answered to the patient's satisfaction.     Skylie Hiott

## 2024-05-30 NOTE — Op Note (Signed)
 Date of Surgery: 05/30/2024  INDICATIONS: Patient is a 68 y.o.-year-old female with left thumb CMC osteoarthritis, carpal tunnel syndrome, and small finger stenosing tenosynovitis which has failed conservative management.  Risks, benefits, and alternatives to surgery were again discussed with the patient in the preoperative area. The patient wishes to proceed with surgery.  Informed consent was signed after our discussion.   PREOPERATIVE DIAGNOSIS:  Left thumb CMC arthritis Left carpal tunnel syndrome Left small finger stenosing tenosynovitis  POSTOPERATIVE DIAGNOSIS: Same.  PROCEDURE:  Left thumb CMC injection Left carpal tunnel syndrome Left small finger A1 pulley release   SURGEON: Carlin Galla, M.D.  ASSIST: None  ANESTHESIA:  Local, MAC  IV FLUIDS AND URINE: See anesthesia.  ESTIMATED BLOOD LOSS: 5 mL.  IMPLANTS: * No implants in log *   DRAINS: None  COMPLICATIONS: None noted  DESCRIPTION OF PROCEDURE:  The patient was met in the preoperative holding area where the surgical site was marked and the informed consent form was signed.  The patient was then brought back to the operating room and remained on the stretcher.  A hand table was placed adjacent to the operative extremity and locked into place.  A tourniquet was placed on the left forearm.  A formal timeout was performed to confirm that this was the correct patient, surgical side, surgical site, and surgical procedure.  All were present and in agreement. Following formal timeout, a local block was performed using 10 mL of 0.25% plain marcaine .  The thumb CMC joint was injected with 1 mL of 1% plain lidocaine  and 1 mL of 6 mg/mL celesteone/betamethasone. The left upper extremity was then prepped and draped in the usual and sterile fashion.   Following a second timeout, the limb was exsanguinated and the tourniquet inflated to 250 mmHg.  A longitudinal incision was made in line with the radial border of the ring  finger from distal to the wrist flexion crease to the intersection of Kaplan's cardinal line.  The skin and subcutaneous tissue was sharply divided.  The longitudinally running palmar fascia was incised.  The thenar musculature was bluntly swept off of the transverse carpal ligament.  The ligament was divided from proximal to distal until the fat surrounding the palmar arch was encountered. Retractors were then placed in the proximal aspect of the wound to visualize the distal antebrachial fascia.  The fascia was sharply divided under direct visualization.     A longitudinal incision was then made over the small finger A1 pulley.  The skin was incised.  Blunt dissection was used to identify the A1 pulley.  Two Ragnell retractors were placed on the radial and ulnar sides of the pulley to protect the respective neurovascular bundles.  A third Ragnell was placed at the distal aspect of the wound.  The A1 pulley was clearly identified.  Under direct visualization, the pulley was entered sharply using a 15 blade.  Tenotomy scissors were used to complete the pulley release distally to the level of the A2 pulley.  The distal retractor was then placed in the proximal aspect of the wound.  Under direct visualization, the proximal aspect of the A1 pulley was completely released.   The wounds were then thoroughly irrigated.  They were closed using 4-0 vicryl rapide sutures in a horizontal mattress fashion.  The wounds was dressed with xeroform, folded kerlix, and an ace wrap.  The patient was reversed from sedation and the drapes taken down.   All counts were correct x 2 at  the end of the procedure.  The patient was then taken to the PACU in stable condition.    POSTOPERATIVE PLAN: She will be discharged to home with appropriate pain medication and discharge instructions.  I'll see her back in the office in 10-14 days for her first postop visit.   Carlin Galla, MD 3:06 PM

## 2024-05-30 NOTE — Anesthesia Preprocedure Evaluation (Addendum)
 Anesthesia Evaluation  Patient identified by MRN, date of birth, ID band Patient awake    Reviewed: Allergy  & Precautions, H&P , NPO status , Patient's Chart, lab work & pertinent test results, reviewed documented beta blocker date and time   Airway Mallampati: I  TM Distance: >3 FB Neck ROM: Full    Dental no notable dental hx. (+) Edentulous Upper, Edentulous Lower, Dental Advisory Given   Pulmonary asthma , Current Smoker and Patient abstained from smoking.   Pulmonary exam normal breath sounds clear to auscultation       Cardiovascular hypertension, Pt. on medications and Pt. on home beta blockers + CAD, + Past MI and + Cardiac Stents   Rhythm:Regular Rate:Normal     Neuro/Psych   Anxiety Depression Bipolar Disorder   negative neurological ROS     GI/Hepatic Neg liver ROS,GERD  Medicated,,  Endo/Other  diabetes, Type 2Hypothyroidism  Class 3 obesity  Renal/GU negative Renal ROS  negative genitourinary   Musculoskeletal  (+) Arthritis , Osteoarthritis,    Abdominal   Peds  Hematology negative hematology ROS (+)   Anesthesia Other Findings   Reproductive/Obstetrics negative OB ROS                              Anesthesia Physical Anesthesia Plan  ASA: 3  Anesthesia Plan: MAC   Post-op Pain Management: Tylenol  PO (pre-op)*   Induction: Intravenous  PONV Risk Score and Plan: 2 and Ondansetron , Dexamethasone , Propofol  infusion and Midazolam   Airway Management Planned: Natural Airway and Simple Face Mask  Additional Equipment:   Intra-op Plan:   Post-operative Plan:   Informed Consent: I have reviewed the patients History and Physical, chart, labs and discussed the procedure including the risks, benefits and alternatives for the proposed anesthesia with the patient or authorized representative who has indicated his/her understanding and acceptance.     Dental advisory  given  Plan Discussed with: CRNA  Anesthesia Plan Comments:          Anesthesia Quick Evaluation

## 2024-05-30 NOTE — H&P (Signed)
 HAND SURGERY   HPI: Patient is a 68 y.o. female who presents with left thumb CMC osteoarthritis, left carpal tunnel syndrome, and left small finger stenosing tenosynovitis.  Patient denies any changes to their medical history or new systemic symptoms today.    Past Medical History:  Diagnosis Date   Anginal pain    Anxiety and depression    Arthritis    Asthma    CAD (coronary artery disease)    a. s/p BMS to RCA and LCX 2008; b. ISR of RCA rx'd with Xience DES 12/09; c. Requires extensive sedation for cath; d. cath 11/10: nonobs; e.Cath 2/12: nonobs; f. 05/2011 Cath: nonobs; g. 02/2013 nonobs; h. 07/2013 lat isch on nuc-->cath: nonobstructive, EF 60-65%.   Chronic chest pain    Colitis    IBS   Complication of anesthesia    Diabetes mellitus without complication (HCC)    FUO (fever of unknown origin) 11/15/2019   GERD (gastroesophageal reflux disease)    H/O ETOH abuse    H/O: suicide attempt    a. multiple   Hyperlipidemia    Hypertension    Hypothyroidism    IBS (irritable bowel syndrome)    Myocardial infarction (HCC) 2008   Obesity    Pneumonia    Tobacco abuse    Past Surgical History:  Procedure Laterality Date   ARTHROSCOPY KNEE W/ DRILLING Left    CARDIAC CATHETERIZATION  2008   stents   CARPAL TUNNEL RELEASE     CATARACT EXTRACTION W/ INTRAOCULAR LENS IMPLANT Bilateral 11/2020   CESAREAN SECTION     x 3   COLON BIOPSY  08/12/2008   LEFT HEART CATHETERIZATION WITH CORONARY ANGIOGRAM  06/04/2011   Procedure: LEFT HEART CATHETERIZATION WITH CORONARY ANGIOGRAM;  Surgeon: Lonni JONETTA Cash, MD;  Location: [facility] CATH LAB;  Service: Cardiovascular;;   LEFT HEART CATHETERIZATION WITH CORONARY ANGIOGRAM N/A 02/22/2013   Procedure: LEFT HEART CATHETERIZATION WITH CORONARY ANGIOGRAM;  Surgeon: Mickey Aleene JINNY Alveta, MD;  Location: [facility] CATH LAB;  Service: Cardiovascular;  Laterality: N/A;   LEFT HEART CATHETERIZATION WITH CORONARY ANGIOGRAM N/A 07/25/2013   Procedure:  LEFT HEART CATHETERIZATION WITH CORONARY ANGIOGRAM;  Surgeon: Victory LELON Claudene DOUGLAS, MD;  Location: [facility] CATH LAB;  Service: Cardiovascular;  Laterality: N/A;   PTCA     stent   removal ganglion Left    removal of breast tumor Right 1973   REVERSE SHOULDER ARTHROPLASTY Right 02/19/2022   Procedure: REVERSE SHOULDER ARTHROPLASTY;  Surgeon: Sharl Selinda Dover, MD;  Location: [facility] ORS;  Service: Orthopedics;  Laterality: Right;   ROTATOR CUFF REPAIR Right 2013   TOTAL HIP ARTHROPLASTY Left 05/20/2020   Procedure: TOTAL HIP ARTHROPLASTY ANTERIOR APPROACH;  Surgeon: Ernie Cough, MD;  Location: [facility] ORS;  Service: Orthopedics;  Laterality: Left;  70 mins   TUBAL LIGATION  1982   Social History   Socioeconomic History   Marital status: Divorced    Spouse name: Not on file   Number of children: 3   Years of education: Not on file   Highest education level: Not on file  Occupational History    Comment: Workmen's comp  Tobacco Use   Smoking status: Every Day    Current packs/day: 0.00    Average packs/day: 0.3 packs/day for 43.0 years (10.8 ttl pk-yrs)    Types: Cigarettes    Start date: 11/06/1976    Last attempt to quit: 11/07/2019    Years since quitting: 4.5   Smokeless tobacco: Never  Vaping Use  Vaping status: Never Used  Substance and Sexual Activity   Alcohol  use: Yes    Alcohol /week: 0.0 standard drinks of alcohol     Comment: rarely   Drug use: Yes    Comment: CBD occasionally   Sexual activity: Not Currently    Birth control/protection: Post-menopausal  Other Topics Concern   Not on file  Social History Narrative   Not on file   Social Drivers of Health   Tobacco Use: High Risk (05/30/2024)   Patient History    Smoking Tobacco Use: Every Day    Smokeless Tobacco Use: Never    Passive Exposure: Not on file  Financial Resource Strain: Not on file  Food Insecurity: Low Risk (11/09/2023)   Received from [facility]   Epic    Within the past 12 months, you worried that  your food would run out before you got money to buy more: Never true    Within the past 12 months, the food you bought just didn't last and you didn't have money to get more. : Never true  Transportation Needs: Unmet Transportation Needs (11/09/2023)   Received from [facility]    In the past 12 months, has lack of reliable transportation kept you from medical appointments, meetings, work or from getting things needed for daily living? : Yes  Physical Activity: Not on file  Stress: Not on file  Social Connections: Not on file  Depression (EYV7-0): Not on file  Alcohol  Screen: Not on file  Housing: Low Risk (11/09/2023)   Received from [facility]   Epic    What is your living situation today?: I have a steady place to live    Think about the place you live. Do you have problems with any of the following? Choose all that apply:: None/None on this list  Utilities: Low Risk (08/17/2023)   Received from [facility]   Utilities    In the past 12 months has the electric, gas, oil, or water  company threatened to shut off services in your home? : No  Health Literacy: Not on file   Family History  Problem Relation Age of Onset   Coronary artery disease Mother    Emphysema Mother    Colon cancer Father    Prostate cancer Father    - negative except otherwise stated in the family history section Allergies[1] Prior to Admission medications  Medication Sig Start Date End Date Taking? Authorizing Provider  aspirin  81 MG chewable tablet Chew 81 mg by mouth daily. 06/06/13  Yes [provider]  atorvastatin  (LIPITOR) 40 MG tablet Take 1 tablet (40 mg total) by mouth daily at 6 PM. NEEDS APPOINTMENT FOR FUTURE REFILLS Patient taking differently: Take 40 mg by mouth every evening. 08/18/15  Yes Lynwood Schilling, MD  buprenorphine (BUTRANS) 10 MCG/HR PTWK Place onto the skin once a week.   Yes [provider]  clopidogrel  (PLAVIX ) 75 MG tablet Take 1 tablet (75 mg  total) by mouth daily. NEEDS APPOINTMENT FOR FUTURE REFILLS OR 90-DAY SUPPLY Patient taking differently: Take 75 mg by mouth daily after supper. NEEDS APPOINTMENT FOR FUTURE REFILLS OR 90-DAY SUPPLY 08/18/15  Yes Lynwood Schilling, MD  furosemide  (LASIX ) 20 MG tablet Take 20 mg by mouth daily. 05/25/20  Yes [provider]  JARDIANCE  10 MG TABS tablet Take 10 mg by mouth daily. 11/17/21  Yes [provider]  levothyroxine  (SYNTHROID ) 88 MCG tablet Take 88 mcg by mouth daily before breakfast. 10/14/20  Yes [provider]  metoprolol  succinate (TOPROL -XL) 25 MG 24 hr tablet Take 1 tablet by mouth daily. 07/06/22  Yes [provider]  Multiple Vitamin (MULTIVITAMIN WITH MINERALS) TABS tablet Take 1 tablet by mouth daily after breakfast. Seniors   Yes [provider]  nicotine  (NICODERM CQ  - DOSED IN MG/24 HOURS) 21 mg/24hr patch Place 14 mg onto the skin as needed.   Yes [provider]  nitroGLYCERIN  (NITROSTAT ) 0.4 MG SL tablet Place 1 tablet (0.4 mg total) under the tongue every 5 (five) minutes as needed for chest pain. Patient taking differently: Place 0.4 mg under the tongue every 5 (five) minutes x 3 doses as needed for chest pain. 06/21/13  Yes Lynwood Schilling, MD  olmesartan  (BENICAR ) 5 MG tablet Take 5 mg by mouth daily after breakfast.   Yes [provider]  pantoprazole  (PROTONIX ) 40 MG tablet Take 40 mg by mouth daily. One hour before coffee 10/14/20  Yes [provider]  prazosin  (MINIPRESS ) 1 MG capsule Take 2 mg by mouth at bedtime. One hour before bedtime 01/21/19  Yes [provider]  ranolazine  (RANEXA ) 500 MG 12 hr tablet Take 500 mg by mouth in the morning and at bedtime.  11/24/19  Yes [provider]  spironolactone  (ALDACTONE ) 25 MG tablet Take 0.5 tablets by mouth daily. 07/27/22  Yes [provider]  traZODone  (DESYREL ) 100 MG tablet Take 100 mg by mouth at bedtime. On hour before bedtime   Yes  [provider]  traZODone  (DESYREL ) 50 MG tablet Take 50 mg by mouth 2 (two) times daily. 1/2 tab as needed bid for anxiety   Yes [provider]  venlafaxine  (EFFEXOR ) 37.5 MG tablet Take 37.5 mg by mouth 2 (two) times daily.   Yes [provider]  ondansetron  (ZOFRAN ) 4 MG tablet Take 1 tablet (4 mg total) by mouth every 8 (eight) hours as needed for nausea or vomiting. Patient not taking: Reported on 10/09/2022 02/20/22   Sharl Selinda Dover, MD  venlafaxine  XR (EFFEXOR -XR) 150 MG 24 hr capsule Take 150 mg by mouth daily after breakfast. Take with 37.5 mg for a total of 187.5 mg after breakfast 11/02/19   [provider]   No results found. - Positive ROS: All other systems have been reviewed and were otherwise negative with the exception of those mentioned in the HPI and as above.  Physical Exam: General: No acute distress, resting comfortably Cardiovascular: BUE warm and well perfused, normal rate Respiratory: Normal WOB on RA Skin: Warm and dry Neurologic: Sensation intact distally Psychiatric: Patient is at baseline mood and affect  Left Upper Extremity  TTP at base of thumb with positive grind test.  + Tinel sign at wrist.  TTP over small finger A1 pulley with subtle palpable catching.  4/5 thenar motor strength.  SILT m/u/r distributions.  Fingers pink and well perfused.    Assessment: 68 yo F w/ left thumb CMC OA, L CTS, and L small finger stenosing tenosynovitis.   Plan: OR today for left thumb CMC injection, left carpal tunnel release, and left small finger A1 pulley release. We again reviewed the risks of surgery which include bleeding, infection, damage to neurovascular structures, persistent symptoms, scar sensitivity/pillar pain, need for additional surgery.  Informed consent was signed.  All questions were answered.   Bebe Galla, M.D. [facility] 1:51 PM     [1]  Allergies Allergen Reactions   Lamictal  [Lamotrigine ]  Nausea And Vomiting    And blurred vision  Heparin  Other (See Comments)    Injections in the stomach causes huge knots, tenderness, and pain for weeks (hematomas)  Other Reaction(s): Not available   Isosorbide  Nitrate Other (See Comments)    Low blood pressure   Nitroglycerin  Other (See Comments)    Patches--Migraine  Drip--nausea and vomiting  Other Reaction(s): Not available   Paroxetine Diarrhea   Perphenazine Other (See Comments)     Dizziness, agitation, and jitteriness   Sertraline      Other Reaction(s): Diarrhea, GI Intolerance   Zoloft  [Sertraline  Hcl] Diarrhea and Nausea Only    Severe diarrhea   Zolpidem  Other (See Comments)    Sleep walking and loss of memory

## 2024-05-30 NOTE — Transfer of Care (Signed)
 Immediate Anesthesia Transfer of Care Note  Patient: Kara Lamb  Procedure(s) Performed: CARPAL TUNNEL RELEASE (Left: Hand) RELEASE, A1 PULLEY, FOR TRIGGER FINGER (Left: Hand)  Patient Location: PACU  Anesthesia Type:MAC  Level of Consciousness: awake, alert , and patient cooperative  Airway & Oxygen  Therapy: Patient Spontanous Breathing and Patient connected to face mask oxygen   Post-op Assessment: Report given to RN and Post -op Vital signs reviewed and stable  Post vital signs: Reviewed and stable  Last Vitals:  Vitals Value Taken Time  BP 160/112 05/30/24 15:07  Temp    Pulse 77 05/30/24 15:08  Resp 23 05/30/24 15:08  SpO2 100 % 05/30/24 15:08  Vitals shown include unfiled device data.  Last Pain:  Vitals:   05/30/24 1258  PainSc: 7          Complications: No notable events documented.

## 2024-05-30 NOTE — Discharge Instructions (Addendum)
 Post Anesthesia Home Care Instructions  Activity: Get plenty of rest for the remainder of the day. A responsible individual must stay with you for 24 hours following the procedure.  For the next 24 hours, DO NOT: -Drive a car -Advertising copywriter -Drink alcoholic beverages -Take any medication unless instructed by your physician -Make any legal decisions or sign important papers.  Meals: Start with liquid foods such as gelatin or soup. Progress to regular foods as tolerated. Avoid greasy, spicy, heavy foods. If nausea and/or vomiting occur, drink only clear liquids until the nausea and/or vomiting subsides. Call your physician if vomiting continues.  Special Instructions/Symptoms: Your throat may feel dry or sore from the anesthesia or the breathing tube placed in your throat during surgery. If this causes discomfort, gargle with warm salt water . The discomfort should disappear within 24 hours.  If you had a scopolamine patch placed behind your ear for the management of post- operative nausea and/or vomiting:  1. The medication in the patch is effective for 72 hours, after which it should be removed.  Wrap patch in a tissue and discard in the trash. Wash hands thoroughly with soap and water . 2. You may remove the patch earlier than 72 hours if you experience unpleasant side effects which may include dry mouth, dizziness or visual disturbances. 3. Avoid touching the patch. Wash your hands with soap and water  after contact with the patch.    Post Anesthesia Home Care Instructions  Activity: Get plenty of rest for the remainder of the day. A responsible individual must stay with you for 24 hours following the procedure.  For the next 24 hours, DO NOT: -Drive a car -Advertising copywriter -Drink alcoholic beverages -Take any medication unless instructed by your physician -Make any legal decisions or sign important papers.  Meals: Start with liquid foods such as gelatin or soup. Progress to  regular foods as tolerated. Avoid greasy, spicy, heavy foods. If nausea and/or vomiting occur, drink only clear liquids until the nausea and/or vomiting subsides. Call your physician if vomiting continues.  Special Instructions/Symptoms: Your throat may feel dry or sore from the anesthesia or the breathing tube placed in your throat during surgery. If this causes discomfort, gargle with warm salt water . The discomfort should disappear within 24 hours.      Carlin Galla, M.D. Hand Surgery  POST-OPERATIVE DISCHARGE INSTRUCTIONS   PRESCRIPTIONS: - You may have been given a prescription to be taken as directed for post-operative pain control.  You may also take over the counter ibuprofen /aleve and tylenol  for pain. Take this as directed on the packaging. Do not exceed 3000 mg tylenol /acetaminophen  in 24 hours.  Ibuprofen  600-800 mg (3-4) tablets by mouth every 6 hours as needed for pain.   OR  Aleve 2 tablets by mouth every 12 hours (twice daily) as needed for pain.   AND/OR  Tylenol  1000 mg (2 tablets) every 8 hours as needed for pain.  - Please use your pain medication carefully, as refills are limited and you may not be provided with one.  As stated above, please use over the counter pain medicine - it will also be helpful with decreasing your swelling.    ANESTHESIA: -After your surgery, post-surgical discomfort or pain is likely. This discomfort can last several days to a few weeks. At certain times of the day your discomfort may be more intense.   Did you receive a nerve block?   - A nerve block can provide pain relief for one hour to  two days after your surgery. As long as the nerve block is working, you will experience little or no sensation in the area the surgeon operated on.  - As the nerve block wears off, you will begin to experience pain or discomfort. It is very important that you begin taking your prescribed pain medication before the nerve block fully wears off.  Treating your pain at the first sign of the block wearing off will ensure your pain is better controlled and more tolerable when full-sensation returns. Do not wait until the pain is intolerable, as the medicine will be less effective. It is better to treat pain in advance than to try and catch up.   General Anesthesia:  If you did not receive a nerve block during your surgery, you will need to start taking your pain medication shortly after your surgery and should continue to do so as prescribed by your surgeon.     ICE AND ELEVATION: - You may use ice for the first 48-72 hours, but it is not critical.   - Motion of your fingers is very important to decrease the swelling.  - Elevation, as much as possible for the next 48 hours, is critical for decreasing swelling as well as for pain relief. Elevation means when you are seated or lying down, you hand should be at or above your heart. When walking, the hand needs to be at or above the level of your elbow.  - If the bandage gets too tight, it may need to be loosened. Please contact our office and we will instruct you in how to do this.    SURGICAL BANDAGES:  - Keep your dressing and/or splint clean and dry at all times.  You can remove your dressing 7 days from now and change with a dry dressing or Band-Aids as needed thereafter. - You may place a plastic bag over your bandage to shower, but be careful, do not get your bandages wet.  - After the bandages have been removed, it is OK to get the stitches wet in a shower or with hand washing. Do Not soak or submerge the wound yet. Please do not use lotions or creams on the stitches.      HAND THERAPY:  - You may not need any. If you do, we will begin this at your follow up visit in the clinic.    ACTIVITY AND WORK: - You are encouraged to move any fingers which are not in the bandage.  - Light use of the fingers is allowed to assist the other hand with daily hygiene and eating, but strong gripping  or lifting is often uncomfortable and should be avoided.  - You might miss a variable period of time from work and hopefully this issue has been discussed prior to surgery. You may not do any heavy work with your affected hand for about 2 weeks.    [facility] Second Floor, [address] [place], [place] [address] [phone]

## 2024-05-30 NOTE — Anesthesia Postprocedure Evaluation (Signed)
 Anesthesia Post Note  Patient: Kara Lamb  Procedure(s) Performed: CARPAL TUNNEL RELEASE (Left: Hand) RELEASE, A1 PULLEY, FOR TRIGGER FINGER (Left: Hand)     Patient location during evaluation: PACU Anesthesia Type: MAC Level of consciousness: awake and alert Pain management: pain level controlled Vital Signs Assessment: post-procedure vital signs reviewed and stable Respiratory status: spontaneous breathing, nonlabored ventilation and respiratory function stable Cardiovascular status: blood pressure returned to baseline Postop Assessment: no apparent nausea or vomiting Anesthetic complications: no   No notable events documented.  Last Vitals:  Vitals:   05/30/24 1508 05/30/24 1515  BP: (!) 126/113 (!) 122/53  Pulse: 77 73  Resp: 16 17  Temp:    SpO2: 100% 97%    Last Pain:  Vitals:   05/30/24 1508  PainSc: 3                  [address]

## 2024-05-31 ENCOUNTER — Encounter (HOSPITAL_BASED_OUTPATIENT_CLINIC_OR_DEPARTMENT_OTHER): Payer: Self-pay | Admitting: Orthopedic Surgery
